# Patient Record
Sex: Male | Born: 1970 | Race: White | Hispanic: No | Marital: Single | Smoking: Current every day smoker
Health system: Southern US, Community
[De-identification: ages and names within clinical notes are randomized; demographics above are authoritative.]

## PROBLEM LIST (undated history)

## (undated) DIAGNOSIS — F1020 Alcohol dependence, uncomplicated: Secondary | ICD-10-CM

## (undated) DIAGNOSIS — F32A Depression, unspecified: Secondary | ICD-10-CM

## (undated) DIAGNOSIS — F329 Major depressive disorder, single episode, unspecified: Secondary | ICD-10-CM

## (undated) DIAGNOSIS — D696 Thrombocytopenia, unspecified: Secondary | ICD-10-CM

## (undated) DIAGNOSIS — F319 Bipolar disorder, unspecified: Secondary | ICD-10-CM

## (undated) HISTORY — PX: NO PAST SURGERIES: SHX2092

---

## 2010-03-17 ENCOUNTER — Emergency Department: Payer: Self-pay | Admitting: Emergency Medicine

## 2012-03-18 ENCOUNTER — Encounter (HOSPITAL_COMMUNITY): Payer: Self-pay

## 2012-03-18 ENCOUNTER — Emergency Department (HOSPITAL_COMMUNITY)
Admission: EM | Admit: 2012-03-18 | Discharge: 2012-03-20 | Disposition: A | Discharge status: Home / Self Care | Payer: Self-pay | Attending: Emergency Medicine | Admitting: Emergency Medicine

## 2012-03-18 DIAGNOSIS — R259 Unspecified abnormal involuntary movements: Secondary | ICD-10-CM | POA: Insufficient documentation

## 2012-03-18 DIAGNOSIS — F1010 Alcohol abuse, uncomplicated: Secondary | ICD-10-CM | POA: Insufficient documentation

## 2012-03-18 LAB — RAPID URINE DRUG SCREEN, HOSP PERFORMED
Amphetamines: NOT DETECTED
Barbiturates: NOT DETECTED
Benzodiazepines: POSITIVE — AB
Cocaine: NOT DETECTED

## 2012-03-18 LAB — COMPREHENSIVE METABOLIC PANEL
ALT: 67 U/L — ABNORMAL HIGH (ref 0–53)
Albumin: 4.2 g/dL (ref 3.5–5.2)
Alkaline Phosphatase: 92 U/L (ref 39–117)
Calcium: 9.2 mg/dL (ref 8.4–10.5)
GFR calc Af Amer: >90 mL/min (ref >90)
Glucose, Bld: 147 mg/dL — ABNORMAL HIGH (ref 70–99)
Potassium: 3.8 mEq/L (ref 3.5–5.1)
Sodium: 139 mEq/L (ref 135–145)
Total Protein: 7.7 g/dL (ref 6.0–8.3)

## 2012-03-18 LAB — ACETAMINOPHEN LEVEL: Acetaminophen (Tylenol), Serum: <15 ug/mL (ref 10–30)

## 2012-03-18 LAB — CBC
MCH: 32.9 pg (ref 26.0–34.0)
MCHC: 36.2 g/dL — ABNORMAL HIGH (ref 30.0–36.0)
RDW: 14.7 % (ref 11.5–15.5)

## 2012-03-18 LAB — ETHANOL: Alcohol, Ethyl (B): 340 mg/dL — ABNORMAL HIGH (ref 0–11)

## 2012-03-18 MED ORDER — ZOLPIDEM TARTRATE 5 MG PO TABS
5.0000 mg | ORAL_TABLET | Freq: Every evening | ORAL | Status: DC | PRN
  Administered 2012-03-19 (×2): 5 mg via ORAL
  Filled 2012-03-18 (×2): qty 1

## 2012-03-18 MED ORDER — VITAMIN B-1 100 MG PO TABS
100.0000 mg | ORAL_TABLET | Freq: Every day | ORAL | Status: DC
  Administered 2012-03-18 – 2012-03-20 (×3): 100 mg via ORAL
  Filled 2012-03-18 (×3): qty 1

## 2012-03-18 MED ORDER — SODIUM CHLORIDE 0.9 % IV BOLUS (SEPSIS)
1000.0000 mL | Freq: Once | INTRAVENOUS | Status: AC
  Administered 2012-03-18: 1000 mL via INTRAVENOUS

## 2012-03-18 MED ORDER — LORAZEPAM 1 MG PO TABS
1.0000 mg | ORAL_TABLET | Freq: Four times a day (QID) | ORAL | Status: DC | PRN
  Administered 2012-03-19 – 2012-03-20 (×2): 1 mg via ORAL
  Filled 2012-03-18 (×3): qty 1
  Filled 2012-03-18: qty 2

## 2012-03-18 MED ORDER — THIAMINE HCL 100 MG/ML IJ SOLN: 100.0000 mg | Freq: Every day | INTRAMUSCULAR | Status: DC

## 2012-03-18 MED ORDER — LORAZEPAM 1 MG PO TABS
0.0000 mg | ORAL_TABLET | Freq: Two times a day (BID) | ORAL | Status: DC
  Filled 2012-03-18: qty 1

## 2012-03-18 MED ORDER — THIAMINE HCL 100 MG/ML IJ SOLN
Freq: Once | INTRAVENOUS | Status: DC
  Filled 2012-03-18: qty 1000

## 2012-03-18 MED ORDER — ONDANSETRON HCL 4 MG PO TABS
4.0000 mg | ORAL_TABLET | Freq: Three times a day (TID) | ORAL | Status: DC | PRN
  Administered 2012-03-19: 4 mg via ORAL
  Filled 2012-03-18: qty 1

## 2012-03-18 MED ORDER — LORAZEPAM 2 MG/ML IJ SOLN: 1.0000 mg | Freq: Once | INTRAMUSCULAR | Status: DC

## 2012-03-18 MED ORDER — ACETAMINOPHEN 325 MG PO TABS
650.0000 mg | ORAL_TABLET | ORAL | Status: DC | PRN
  Administered 2012-03-19: 650 mg via ORAL
  Filled 2012-03-18: qty 2

## 2012-03-18 MED ORDER — LORAZEPAM 1 MG PO TABS
0.0000 mg | ORAL_TABLET | Freq: Four times a day (QID) | ORAL | Status: AC
  Administered 2012-03-18 – 2012-03-19 (×4): 1 mg via ORAL
  Administered 2012-03-19 – 2012-03-20 (×2): 2 mg via ORAL
  Administered 2012-03-20: 1 mg via ORAL
  Filled 2012-03-18 (×2): qty 1
  Filled 2012-03-18: qty 2

## 2012-03-18 MED ORDER — FOLIC ACID 1 MG PO TABS
1.0000 mg | ORAL_TABLET | Freq: Every day | ORAL | Status: DC
  Administered 2012-03-18 – 2012-03-20 (×3): 1 mg via ORAL
  Filled 2012-03-18 (×3): qty 1

## 2012-03-18 MED ORDER — ADULT MULTIVITAMIN W/MINERALS CH
1.0000 | ORAL_TABLET | Freq: Every day | ORAL | Status: DC
  Administered 2012-03-18 – 2012-03-20 (×3): 1 via ORAL
  Filled 2012-03-18 (×3): qty 1

## 2012-03-18 MED ORDER — LORAZEPAM 2 MG/ML IJ SOLN
1.0000 mg | Freq: Four times a day (QID) | INTRAMUSCULAR | Status: DC | PRN
  Administered 2012-03-18 (×2): 1 mg via INTRAVENOUS
  Filled 2012-03-18 (×2): qty 1

## 2012-03-18 MED ORDER — NICOTINE 21 MG/24HR TD PT24
21.0000 mg | MEDICATED_PATCH | Freq: Every day | TRANSDERMAL | Status: DC
  Administered 2012-03-18 – 2012-03-20 (×3): 21 mg via TRANSDERMAL
  Filled 2012-03-18 (×3): qty 1

## 2012-03-18 NOTE — ED Notes (Signed)
Pt here for ETOH Detox, voluntary, previous hx of detox in past, Last drank this morning (can of beer), Pt usually drinks heavy daily.
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
## 2012-03-18 NOTE — ED Notes (Signed)
Pt states he will probably have a seizure in about an hour. States about 12-15 hrs after having his last drink, he has seizures.

## 2012-03-18 NOTE — ED Provider Notes (Signed)
Medical screening examination/treatment/procedure(s) were performed by non-physician practitioner and as supervising physician I was immediately available for consultation/collaboration.  John-Adam Heavenleigh Petruzzi, M.D.     John-Adam Bohden Dung, MD 03/18/12 1821 

## 2012-03-18 NOTE — ED Notes (Signed)
Gave patient a coke, sandwich, and some potato chips

## 2012-03-18 NOTE — ED Notes (Signed)
Pt states he is here for alcohol detox. States he drinks about a 5th of liquor every day chased by a couple of beers. Pt states that he has been through detox before but it was over a year ago.

## 2012-03-18 NOTE — ED Provider Notes (Signed)
History     CSN: [id number]  Arrival date & time 03/18/12  1402   First MD Initiated Contact with Patient 03/18/12 1535      Chief Complaint  Patient presents with  . Medical Clearance    (Consider location/radiation/quality/duration/timing/severity/associated sxs/prior treatment) Patient is a 41 y.o. male presenting with mental health disorder. The history is provided by the patient.  Mental Health Problem The primary symptoms do not include hallucinations. Primary symptoms comment: He is here for help with alcohol detox. He has had vomiting but is not actively nauseaous.  Additional symptoms of the illness do not include no abdominal pain. Associated symptoms comments: He denies substance abuse other than alcohol. Last drinking was this morning. He reports history of seizures with alcohol cessation in the past. .    Past Medical History  Diagnosis Date  . Alcohol abuse     Past Surgical History  Procedure Date  . No past surgeries     No family history on file.  History  Substance Use Topics  . Smoking status: Current Every Day Smoker  . Smokeless tobacco: Not on file  . Alcohol Use: Yes      Review of Systems  Constitutional: Negative for fever.  Respiratory: Negative for shortness of breath.   Cardiovascular: Negative for chest pain.  Gastrointestinal: Negative for nausea and abdominal pain.  Psychiatric/Behavioral: Negative for suicidal ideas and hallucinations.    Allergies  Review of patient's allergies indicates no known allergies.  Home Medications  No current outpatient prescriptions on file.  BP 139/95  Pulse 95  Temp 98.1 F (36.7 C) (Oral)  Resp 14  SpO2 94%  Physical Exam  Constitutional: He is oriented to person, place, and time. He appears well-developed and well-nourished.  HENT:  Head: Normocephalic.  Neck: Normal range of motion. Neck supple.  Cardiovascular: Normal rate and regular rhythm.   Pulmonary/Chest: Effort normal and  breath sounds normal.  Abdominal: Soft. Bowel sounds are normal. There is no tenderness. There is no rebound and no guarding.  Musculoskeletal: Normal range of motion.  Neurological: He is alert and oriented to person, place, and time.  Skin: Skin is warm and dry. No rash noted.  Psychiatric: He has a normal mood and affect. Judgment and thought content normal.    ED Course  Procedures (including critical care time)  Labs Reviewed  URINE RAPID DRUG SCREEN (HOSP PERFORMED) - Abnormal; Notable for the following:    Benzodiazepines POSITIVE (*)     All other components within normal limits  CBC  COMPREHENSIVE METABOLIC PANEL  ETHANOL  ACETAMINOPHEN LEVEL  SALICYLATE LEVEL   No results found.   No diagnosis found. 1. Alcohol dependence   MDM  Orders entered for CIWA, psych hold. Patient care transferred to Margarita Grizzle, MD.        Rodena Medin, PA-C 03/18/12 1606

## 2012-03-19 DIAGNOSIS — F1020 Alcohol dependence, uncomplicated: Secondary | ICD-10-CM

## 2012-03-19 LAB — CBC WITH DIFFERENTIAL/PLATELET
Basophils Relative: 1 % (ref 0–1)
Eosinophils Absolute: 0.1 10*3/uL (ref 0.0–0.7)
Eosinophils Relative: 1 % (ref 0–5)
Hemoglobin: 14 g/dL (ref 13.0–17.0)
Lymphs Abs: 0.8 10*3/uL (ref 0.7–4.0)
MCH: 32.3 pg (ref 26.0–34.0)
MCHC: 35.3 g/dL (ref 30.0–36.0)
MCV: 91.7 fL (ref 78.0–100.0)
Monocytes Absolute: 0.6 10*3/uL (ref 0.1–1.0)
Monocytes Relative: 12 % (ref 3–12)
Neutrophils Relative %: 71 % (ref 43–77)
RBC: 4.33 MIL/uL (ref 4.22–5.81)

## 2012-03-19 NOTE — ED Notes (Signed)
Notied EDP of CBC results, EDP inquired about pt.'s condition, informed EDP that pt. Was not bleeding.

## 2012-03-19 NOTE — ED Notes (Signed)
Attempted to get pt.'s V/S, pt. Refused.  Pt. Becoming agitated, will return by 1730 to ask to get V/S.

## 2012-03-19 NOTE — ED Notes (Signed)
158/93 recheck of B/P.

## 2012-03-19 NOTE — ED Notes (Signed)
Nicotine patch removed at 1000.
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
## 2012-03-19 NOTE — ED Notes (Signed)
Notified EDP of pt.'s B/P, will continue to monitor pt.'s condition and redo pt.'s V/S 1700.

## 2012-03-19 NOTE — Consult Note (Signed)
Reason for Consult: Alcohol intoxication, withdrawal and dependence. Referring Physician: Dr. Elisabeth Pigeon Gortney is an 41 y.o. male.  HPI: Patient was seen and chart reviewed. Patient has been suffering with the alcohol abuse versus dependence over several years. Patient has been drinking alcohol, including vodka over or anything with alcohol he can get. Patient has a history of for alcohol treatments both at [facility] and the [facility] in [place]. Patient presented with the blood alcohol level 340 and the urine drug screen positive for benzodiazepines. Patient has withdrawal symptoms like shakes, cold sweats, nausea, and disturbance of sleep. Patient has a 17 years old son, who also drinks occasionally, and 46 years old twins who stays with their mom. Patient used to work for maintenance and set of for PODs. Patient lives in [place] and works in [place], Patient has passive history of for DWIs x 11, delirium tremens and seizures in 1998. Patient has no history of for depression anxiety mania, psychosis.  Past Medical History  Diagnosis Date  . Alcohol abuse     Past Surgical History  Procedure Date  . No past surgeries     No family history on file.  Social History:  reports that he has been smoking.  He does not have any smokeless tobacco history on file. He reports that he drinks alcohol. He reports that he does not use illicit drugs.  Allergies: No Known Allergies  Medications: I have reviewed the patient's current medications.  Results for orders placed during the hospital encounter of 03/18/12 (from the past 48 hour(s))  URINE RAPID DRUG SCREEN (HOSP PERFORMED)     Status: Abnormal   Collection Time   03/18/12  3:07 PM      Component Value Range Comment   Opiates NONE DETECTED  NONE DETECTED    Cocaine NONE DETECTED  NONE DETECTED    Benzodiazepines POSITIVE (*) NONE DETECTED    Amphetamines NONE DETECTED  NONE DETECTED    Tetrahydrocannabinol NONE DETECTED  NONE DETECTED      Barbiturates NONE DETECTED  NONE DETECTED   CBC     Status: Abnormal   Collection Time   03/18/12  3:10 PM      Component Value Range Comment   WBC 4.5  4.0 - 10.5 K/uL    RBC 4.77  4.22 - 5.81 MIL/uL    Hemoglobin 15.7  13.0 - 17.0 g/dL    HCT 78.2  95.6 - 21.3 %    MCV 91.0  78.0 - 100.0 fL    MCH 32.9  26.0 - 34.0 pg    MCHC 36.2 (*) 30.0 - 36.0 g/dL    RDW 08.6  57.8 - 46.9 %    Platelets 36 (*) 150 - 400 K/uL   COMPREHENSIVE METABOLIC PANEL     Status: Abnormal   Collection Time   03/18/12  3:10 PM      Component Value Range Comment   Sodium 139  135 - 145 mEq/L    Potassium 3.8  3.5 - 5.1 mEq/L    Chloride 96  96 - 112 mEq/L    CO2 29  19 - 32 mEq/L    Glucose, Bld 147 (*) 70 - 99 mg/dL    BUN 10  6 - 23 mg/dL    Creatinine, Ser 6.29  0.50 - 1.35 mg/dL    Calcium 9.2  8.4 - 52.8 mg/dL    Total Protein 7.7  6.0 - 8.3 g/dL    Albumin 4.2  3.5 -  5.2 g/dL    AST 914 (*) 0 - 37 U/L    ALT 67 (*) 0 - 53 U/L    Alkaline Phosphatase 92  39 - 117 U/L    Total Bilirubin 0.5  0.3 - 1.2 mg/dL    GFR calc non Af Amer >90  >90 mL/min    GFR calc Af Amer >90  >90 mL/min   ETHANOL     Status: Abnormal   Collection Time   03/18/12  3:10 PM      Component Value Range Comment   Alcohol, Ethyl (B) 340 (*) 0 - 11 mg/dL   ACETAMINOPHEN LEVEL     Status: Normal   Collection Time   03/18/12  3:10 PM      Component Value Range Comment   Acetaminophen (Tylenol), Serum <15.0  10 - 30 ug/mL   SALICYLATE LEVEL     Status: Abnormal   Collection Time   03/18/12  3:10 PM      Component Value Range Comment   Salicylate Lvl <2.0 (*) 2.8 - 20.0 mg/dL   CBC WITH DIFFERENTIAL     Status: Abnormal   Collection Time   03/19/12  2:24 PM      Component Value Range Comment   WBC 5.1  4.0 - 10.5 K/uL    RBC 4.33  4.22 - 5.81 MIL/uL    Hemoglobin 14.0  13.0 - 17.0 g/dL    HCT 78.2  95.6 - 21.3 %    MCV 91.7  78.0 - 100.0 fL    MCH 32.3  26.0 - 34.0 pg    MCHC 35.3  30.0 - 36.0 g/dL    RDW 08.6   57.8 - 46.9 %    Platelets 31 (*) 150 - 400 K/uL    Neutrophils Relative 71  43 - 77 %    Neutro Abs 3.7  1.7 - 7.7 K/uL    Lymphocytes Relative 15  12 - 46 %    Lymphs Abs 0.8  0.7 - 4.0 K/uL    Monocytes Relative 12  3 - 12 %    Monocytes Absolute 0.6  0.1 - 1.0 K/uL    Eosinophils Relative 1  0 - 5 %    Eosinophils Absolute 0.1  0.0 - 0.7 K/uL    Basophils Relative 1  0 - 1 %    Basophils Absolute 0.0  0.0 - 0.1 K/uL     No results found.  Positive for anxiety, excessive alcohol consumption and sleep disturbance Blood pressure 148/93, pulse 81, temperature 98.3 F (36.8 C), temperature source Oral, resp. rate 18, SpO2 97.00%.   Assessment/Plan: Alcohol intoxication withdrawal, and dependence. Benzodiazepine abuse.  Admit to the acute psychiatric hospitalization for detox treatment and stabilization. Continue his current medication treatment for alcohol withdrawal symptoms.  Bell Carbo,JANARDHAHA R. 03/19/2012, 4:46 PM

## 2012-03-19 NOTE — ED Notes (Signed)
Pt complaining of not being able to get any sleep due to people coming in his room. Explained to pt that his blood pressure was elevated and needed to be rechecked.
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
## 2012-03-20 NOTE — ED Provider Notes (Signed)
Patient is in the emergency room voluntarily for alcohol detox. He has been here for over 2 days. Has been unable to be placed. There is no evidence of psychosis. He is not depressed. No significant cognitive impairment. Has mild tremors but no florid withdrawal symptoms. Feel he is appropriate for discharge at this time. Patient will be provided outpatient resources to followup.  Raeford Razor, MD 03/20/12 2303

## 2012-03-20 NOTE — ED Notes (Signed)
Report received-airway intact-no s/s's of distress-will continue to monitor 

## 2012-03-20 NOTE — [facility] Counselor (Signed)
TC with Okey Regal @ [facility] at 1220pm. Stated they had not gotten a referral on this pt. Stated the only have 2 male detox beds available at this time.

## 2012-03-20 NOTE — ED Notes (Signed)
Patient states he wants a Nurse, learning disability when he speaks to Dr Elsie Saas

## 2012-03-20 NOTE — Consult Note (Signed)
Reason for Consult: Alcohol withdrawal symptoms Referring Physician: Dr. Blinda Leatherwood Opdahl is an 41 y.o. male.  HPI: Patient was staying in his bed with a mild hand tremors but overall feeling better with his treatment. Reportedly he has sleep difficulties and sweating over the night. He is eating better than yesterday. Patient was not interested in long-term rehabilitation services and he he had a good insurance and stated rehabilitation services won't take it. He denied symptoms of depression, anxiety, suicidal, homicidal ideations, and psychosis.  Past Medical History  Diagnosis Date  . Alcohol abuse     Past Surgical History  Procedure Date  . No past surgeries     No family history on file.  Social History:  reports that he has been smoking.  He does not have any smokeless tobacco history on file. He reports that he drinks alcohol. He reports that he does not use illicit drugs.  Allergies: No Known Allergies  Medications: I have reviewed the patient's current medications.  Results for orders placed during the hospital encounter of 03/18/12 (from the past 48 hour(s))  CBC WITH DIFFERENTIAL     Status: Abnormal   Collection Time   03/19/12  2:24 PM      Component Value Range Comment   WBC 5.1  4.0 - 10.5 K/uL    RBC 4.33  4.22 - 5.81 MIL/uL    Hemoglobin 14.0  13.0 - 17.0 g/dL    HCT 28.4  13.2 - 44.0 %    MCV 91.7  78.0 - 100.0 fL    MCH 32.3  26.0 - 34.0 pg    MCHC 35.3  30.0 - 36.0 g/dL    RDW 10.2  72.5 - 36.6 %    Platelets 31 (*) 150 - 400 K/uL    Neutrophils Relative 71  43 - 77 %    Neutro Abs 3.7  1.7 - 7.7 K/uL    Lymphocytes Relative 15  12 - 46 %    Lymphs Abs 0.8  0.7 - 4.0 K/uL    Monocytes Relative 12  3 - 12 %    Monocytes Absolute 0.6  0.1 - 1.0 K/uL    Eosinophils Relative 1  0 - 5 %    Eosinophils Absolute 0.1  0.0 - 0.7 K/uL    Basophils Relative 1  0 - 1 %    Basophils Absolute 0.0  0.0 - 0.1 K/uL     No results found.  No depression, No  psychosis and Positive for anxiety, excessive alcohol consumption, sleep disturbance and Hand tremors. Blood pressure 136/87, pulse 93, temperature 98.1 F (36.7 C), temperature source Oral, resp. rate 18, SpO2 97.00%.   Assessment/Plan: Alcohol intoxication and withdrawal Alcohol dependence  Pending [facility] placement. Continue CIWA PROTOCOL.  Carys Malina,JANARDHAHA R. 03/20/2012, 3:42 PM

## 2012-03-20 NOTE — ED Provider Notes (Signed)
Patient is here for alcohol abuse. He states he has not been sleeping well the last several days. He still feels shaky. Filed Vitals:   03/20/12 0508  BP: 146/91  Pulse: 67  Temp: 97.8 F (36.6 C)  Resp: 18     On exam he appears comfortable in no distress. No diaphoresis. Mild tremors noted.  Patient was given Ativan this morning. He is on the CIWA protocol.  The patient was seen by the psychiatrist yesterday. Recommends continuing the current plan. He appears medically stable and is awaiting psychiatric disposition  Celene Kras, MD 03/20/12 [phone]

## 2012-03-20 NOTE — ED Notes (Signed)
Given sandwich and chips 

## 2012-03-20 NOTE — ED Notes (Signed)
Informed MD of elevated BP-ok to medicate per CIWA protocal

## 2012-03-20 NOTE — ED Notes (Signed)
Pt sleeping CIWA score 0

## 2012-03-20 NOTE — ED Notes (Signed)
Pt angry and cursing, because he is being d/c, refusing to allow d/c education, paperwork given, pt continues to curse and fuss about having to leave, refusing to allow updated VS. Escorted out with large hamper full of belongings.

## 2014-08-25 ENCOUNTER — Encounter (HOSPITAL_COMMUNITY): Payer: Self-pay | Admitting: Emergency Medicine

## 2014-08-25 ENCOUNTER — Emergency Department (HOSPITAL_COMMUNITY)
Admission: EM | Admit: 2014-08-25 | Discharge: 2014-08-27 | Disposition: A | Discharge status: Home / Self Care | Payer: Self-pay | Attending: Emergency Medicine | Admitting: Emergency Medicine

## 2014-08-25 ENCOUNTER — Emergency Department (HOSPITAL_COMMUNITY)
Admission: EM | Admit: 2014-08-25 | Discharge: 2014-08-25 | Disposition: A | Discharge status: Home / Self Care | Payer: Self-pay | Attending: Emergency Medicine | Admitting: Emergency Medicine

## 2014-08-25 DIAGNOSIS — F10230 Alcohol dependence with withdrawal, uncomplicated: Secondary | ICD-10-CM | POA: Diagnosis present

## 2014-08-25 DIAGNOSIS — Z720 Tobacco use: Secondary | ICD-10-CM | POA: Insufficient documentation

## 2014-08-25 DIAGNOSIS — F10920 Alcohol use, unspecified with intoxication, uncomplicated: Secondary | ICD-10-CM

## 2014-08-25 DIAGNOSIS — R110 Nausea: Secondary | ICD-10-CM | POA: Insufficient documentation

## 2014-08-25 DIAGNOSIS — F1010 Alcohol abuse, uncomplicated: Secondary | ICD-10-CM

## 2014-08-25 DIAGNOSIS — Z79899 Other long term (current) drug therapy: Secondary | ICD-10-CM | POA: Insufficient documentation

## 2014-08-25 DIAGNOSIS — F10120 Alcohol abuse with intoxication, uncomplicated: Secondary | ICD-10-CM | POA: Insufficient documentation

## 2014-08-25 DIAGNOSIS — F10129 Alcohol abuse with intoxication, unspecified: Secondary | ICD-10-CM | POA: Insufficient documentation

## 2014-08-25 LAB — COMPREHENSIVE METABOLIC PANEL
ALBUMIN: 4.5 g/dL (ref 3.5–5.2)
ALT: 15 U/L (ref 0–53)
AST: 30 U/L (ref 0–37)
Alkaline Phosphatase: 70 U/L (ref 39–117)
Anion gap: 10 (ref 5–15)
BILIRUBIN TOTAL: 0.4 mg/dL (ref 0.3–1.2)
BUN: 11 mg/dL (ref 6–23)
CHLORIDE: 105 mmol/L (ref 96–112)
CO2: 26 mmol/L (ref 19–32)
CREATININE: 0.67 mg/dL (ref 0.50–1.35)
Calcium: 8.5 mg/dL (ref 8.4–10.5)
GFR calc Af Amer: >90 mL/min (ref >90)
GFR calc non Af Amer: >90 mL/min (ref >90)
Glucose, Bld: 101 mg/dL — ABNORMAL HIGH (ref 70–99)
Potassium: 3.8 mmol/L (ref 3.5–5.1)
SODIUM: 141 mmol/L (ref 135–145)
Total Protein: 7.4 g/dL (ref 6.0–8.3)

## 2014-08-25 LAB — CBC
HCT: 43 % (ref 39.0–52.0)
Hemoglobin: 14.7 g/dL (ref 13.0–17.0)
MCH: 32.2 pg (ref 26.0–34.0)
MCHC: 34.2 g/dL (ref 30.0–36.0)
MCV: 94.1 fL (ref 78.0–100.0)
PLATELETS: 281 10*3/uL (ref 150–400)
RBC: 4.57 MIL/uL (ref 4.22–5.81)
RDW: 13.7 % (ref 11.5–15.5)
WBC: 6.5 10*3/uL (ref 4.0–10.5)

## 2014-08-25 LAB — RAPID URINE DRUG SCREEN, HOSP PERFORMED
Amphetamines: NOT DETECTED
Barbiturates: NOT DETECTED
Benzodiazepines: NOT DETECTED
Cocaine: NOT DETECTED
OPIATES: NOT DETECTED
Tetrahydrocannabinol: NOT DETECTED

## 2014-08-25 LAB — ACETAMINOPHEN LEVEL: Acetaminophen (Tylenol), Serum: <10 ug/mL — ABNORMAL LOW (ref 10–30)

## 2014-08-25 LAB — ETHANOL: Alcohol, Ethyl (B): 318 mg/dL — ABNORMAL HIGH (ref 0–9)

## 2014-08-25 LAB — SALICYLATE LEVEL

## 2014-08-25 MED ORDER — ONDANSETRON 4 MG PO TBDP: 4.0000 mg | ORAL_TABLET | Freq: Three times a day (TID) | ORAL | Status: DC | PRN

## 2014-08-25 MED ORDER — ONDANSETRON 4 MG PO TBDP
4.0000 mg | ORAL_TABLET | Freq: Once | ORAL | Status: AC
  Administered 2014-08-25: 4 mg via ORAL
  Filled 2014-08-25: qty 1

## 2014-08-25 NOTE — ED Notes (Signed)
Pt eating sandwich in room.

## 2014-08-25 NOTE — ED Notes (Signed)
Pt called from triage, no answer 

## 2014-08-25 NOTE — ED Notes (Signed)
Called pt twice for lab draw, no answer. 

## 2014-08-25 NOTE — ED Notes (Signed)
Pt finished with sandwich and used the restroom. Told pt that he needed to follow me to the discharge window. Pt states he was supposed to get a prescription for nausea "to help detox me".   I spoke with [place] NP, she confirmed she wasn't giving pt prescription.   Made pt aware that we gave him nausea meds here but she wasn't giving him a prescription.   Pt requesting to speak with provider.

## 2014-08-25 NOTE — Discharge Instructions (Signed)
Alcohol Intoxication  Alcohol intoxication occurs when the amount of alcohol that a person has consumed impairs his or her ability to mentally and physically function. Alcohol directly impairs the normal chemical activity of the brain. Drinking large amounts of alcohol can lead to changes in mental function and behavior, and it can cause many physical effects that can be harmful.   Alcohol intoxication can range in severity from mild to very severe. Various factors can affect the level of intoxication that occurs, such as the person's age, gender, weight, frequency of alcohol consumption, and the presence of other medical conditions (such as diabetes, seizures, or heart conditions). Dangerous levels of alcohol intoxication may occur when people drink large amounts of alcohol in a short period (binge drinking). Alcohol can also be especially dangerous when combined with certain prescription medicines or "recreational" drugs.  SIGNS AND SYMPTOMS  Some common signs and symptoms of mild alcohol intoxication include:  · Loss of coordination.  · Changes in mood and behavior.  · Impaired judgment.  · Slurred speech.  As alcohol intoxication progresses to more severe levels, other signs and symptoms will appear. These may include:  · Vomiting.  · Confusion and impaired memory.  · Slowed breathing.  · Seizures.  · Loss of consciousness.  DIAGNOSIS   Your health care provider will take a medical history and perform a physical exam. You will be asked about the amount and type of alcohol you have consumed. Blood tests will be done to measure the concentration of alcohol in your blood. In many places, your blood alcohol level must be lower than 80 mg/dL (0.08%) to legally drive. However, many dangerous effects of alcohol can occur at much lower levels.   TREATMENT   People with alcohol intoxication often do not require treatment. Most of the effects of alcohol intoxication are temporary, and they go away as the alcohol naturally  leaves the body. Your health care provider will monitor your condition until you are stable enough to go home. Fluids are sometimes given through an IV access tube to help prevent dehydration.   HOME CARE INSTRUCTIONS  · Do not drive after drinking alcohol.  · Stay hydrated. Drink enough water and fluids to keep your urine clear or pale yellow. Avoid caffeine.    · Only take over-the-counter or prescription medicines as directed by your health care provider.    SEEK MEDICAL CARE IF:   · You have persistent vomiting.    · You do not feel better after a few days.  · You have frequent alcohol intoxication. Your health care provider can help determine if you should see a substance use treatment counselor.  SEEK IMMEDIATE MEDICAL CARE IF:   · You become shaky or tremble when you try to stop drinking.    · You shake uncontrollably (seizure).    · You throw up (vomit) blood. This may be bright red or may look like black coffee grounds.    · You have blood in your stool. This may be bright red or may appear as a black, tarry, bad smelling stool.    · You become lightheaded or faint.    MAKE SURE YOU:   · Understand these instructions.  · Will watch your condition.  · Will get help right away if you are not doing well or get worse.  Document Released: 03/22/2005 Document Revised: 02/12/2013 Document Reviewed: 11/15/2012  ExitCare® Patient Information ©2015 [employer]. This information is not intended to replace advice given to you by your health care provider. Make sure   you discuss any questions you have with your health care provider.

## 2014-08-25 NOTE — ED Notes (Signed)
Pt called again from triage, no answer 

## 2014-08-25 NOTE — ED Provider Notes (Signed)
CSN: [id number]     Arrival date & time 08/25/14  1258 History  This chart was scribed for non-physician practitioner, Teressa LowerVrinda Kwali Wrinkle, NP working with Layla MawKristen N Ward, DO by Greggory StallionKayla Andersen, ED scribe. This patient was seen in room [facility] and the patient's care was started at 2:15 PM.  Chief Complaint  Patient presents with  . Alcohol Intoxication    pt requesting detox   The history is provided by the patient. No language interpreter was used.    HPI Comments: Jordan Davidson is a 44 y.o. male with history of alcohol abuse who presents to the Emergency Department requesting alcohol detox. Pt was found laying in the grass at a bus stop by [employer] and requested to come here instead of go to jail. He has had about half a gallon of alcohol today and states that is normal for him. His last drink was 6-7 hours ago. Pt reports nausea but no other symptoms currently. He has had DTs and seizures with detoxing in the past.   Past Medical History  Diagnosis Date  . Alcohol abuse    Past Surgical History  Procedure Laterality Date  . No past surgeries     No family history on file. History  Substance Use Topics  . Smoking status: Current Every Day Smoker  . Smokeless tobacco: Not on file  . Alcohol Use: Yes    Review of Systems  Gastrointestinal: Positive for nausea.  All other systems reviewed and are negative.  Allergies  Review of patient's allergies indicates no known allergies.  Home Medications   Prior to Admission medications   Not on File   BP 119/74 mmHg  Pulse 108  Temp(Src) 98.7 F (37.1 C) (Oral)  Resp 16  SpO2 94%   Physical Exam  Constitutional: He is oriented to person, place, and time. He appears well-developed and well-nourished. No distress.  HENT:  Head: Normocephalic and atraumatic.  Eyes: Conjunctivae and EOM are normal.  Neck: Neck supple. No tracheal deviation present.  Cardiovascular: Normal rate.   Pulmonary/Chest: Effort normal. No respiratory distress.   Musculoskeletal: Normal range of motion.  Neurological: He is alert and oriented to person, place, and time.  Skin: Skin is warm and dry.  Psychiatric: He has a normal mood and affect. His behavior is normal.  Nursing note and vitals reviewed.   ED Course  Procedures (including critical care time)  DIAGNOSTIC STUDIES: Oxygen Saturation is 99% on RA, normal by my interpretation.    COORDINATION OF CARE: 2:18 PM-Discussed treatment plan which includes outpatient resources with pt at bedside and pt agreed to plan.   Labs Review Labs Reviewed - No data to display  Imaging Review No results found.   EKG Interpretation None      MDM   Final diagnoses:  Alcohol intoxication, uncomplicated    Pt not requesting detox. Pt states that he wanted to avoid going to jail. Pt ambulatory here without any problem  I personally performed the services described in this documentation, which was scribed in my presence. The recorded information has been reviewed and is accurate.   Teressa LowerVrinda Shekinah Pitones, NP 08/25/14 1440  Layla MawKristen N Ward, DO 08/25/14 [phone]

## 2014-08-25 NOTE — ED Notes (Signed)
Pt states "he's tired of drinking" and "wants to stop." States his last drink was a few hours ago, and he wants ETOH detox. Says he's unsure of how much he's had to drink tonight, and was observed to have a stumbling gait. Vitals stable, no signs of detox observed at this time

## 2014-08-25 NOTE — ED Notes (Signed)
Per EMS-#80. Pt was found lying in grass at bus stop. "I am fine, just drunk" PERL. Ambulatory. Knife taken from pt by [employer], without resistance.

## 2014-08-26 DIAGNOSIS — F10230 Alcohol dependence with withdrawal, uncomplicated: Secondary | ICD-10-CM | POA: Diagnosis present

## 2014-08-26 MED ORDER — VITAMIN B-1 100 MG PO TABS
100.0000 mg | ORAL_TABLET | Freq: Every day | ORAL | Status: DC
  Administered 2014-08-26: 100 mg via ORAL
  Filled 2014-08-26: qty 1

## 2014-08-26 MED ORDER — LORAZEPAM 2 MG/ML IJ SOLN
0.0000 mg | Freq: Four times a day (QID) | INTRAMUSCULAR | Status: DC
  Administered 2014-08-27: 1 mg via INTRAVENOUS

## 2014-08-26 MED ORDER — LORAZEPAM 2 MG/ML IJ SOLN: 0.0000 mg | Freq: Two times a day (BID) | INTRAMUSCULAR | Status: DC

## 2014-08-26 MED ORDER — LORAZEPAM 1 MG PO TABS
1.0000 mg | ORAL_TABLET | Freq: Three times a day (TID) | ORAL | Status: DC | PRN
  Administered 2014-08-26 – 2014-08-27 (×3): 1 mg via ORAL
  Filled 2014-08-26 (×3): qty 1

## 2014-08-26 MED ORDER — ONDANSETRON HCL 4 MG PO TABS: 4.0000 mg | ORAL_TABLET | Freq: Three times a day (TID) | ORAL | Status: DC | PRN

## 2014-08-26 MED ORDER — THIAMINE HCL 100 MG/ML IJ SOLN: 100.0000 mg | Freq: Every day | INTRAMUSCULAR | Status: DC

## 2014-08-26 MED ORDER — ACETAMINOPHEN 325 MG PO TABS: 650.0000 mg | ORAL_TABLET | ORAL | Status: DC | PRN

## 2014-08-26 MED ORDER — NICOTINE 21 MG/24HR TD PT24: 21.0000 mg | MEDICATED_PATCH | Freq: Every day | TRANSDERMAL | Status: DC

## 2014-08-26 MED ORDER — IBUPROFEN 200 MG PO TABS: 600.0000 mg | ORAL_TABLET | Freq: Three times a day (TID) | ORAL | Status: DC | PRN
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
## 2014-08-26 NOTE — ED Notes (Signed)
Pt wanded by security. 

## 2014-08-26 NOTE — ED Notes (Signed)
Provider explained to patient that POC has been changed and he will now be staying overnight.  Support offered.

## 2014-08-26 NOTE — BH Assessment (Signed)
Spoke with EDP about pt. Earlier pt declined detox and is now asking for detox. Pt reports hx of DTs and seizures when detoxing on his own in the past. Pt denies SI, HI. Per Harle BattiestElizabeth Tysinger, NP pt is medically cleared for assessment.   Assessment to commence shortly.    Clista BernhardtNancy Averie Meiner, [facility] Triage Specialist 08/26/2014 3:45 AM

## 2014-08-26 NOTE — ED Notes (Signed)
Pt arrived to unit for alcohol detox. Pt denies SI/HI. No s/s of distress noted at this time. Pt malodorous and disheveled.

## 2014-08-26 NOTE — ED Provider Notes (Signed)
CSN: [id number]     Arrival date & time 08/25/14  1943 History   First MD Initiated Contact with Patient 08/26/14 0214     Chief Complaint  Patient presents with  . Alcohol Intoxication   (Consider location/radiation/quality/duration/timing/severity/associated sxs/prior Treatment) HPI  Jordan Davidson is a 44 year old male presenting with request for detox. He states he drinks approximately a half gallon of alcohol day, either beer, liquor, wine or mouthwash. He states he last drank 6 hours ago and is unsure of the quantity that states he drinks until he drunk and passes out. He reports smoking a pack of cigarettes a day and denies any recreational drugs. He denies any current complaints. He denies suicidal ideation or homicidal ideation. He states he is ready to quit drinking and needs help.  Past Medical History  Diagnosis Date  . Alcohol abuse    Past Surgical History  Procedure Laterality Date  . No past surgeries     History reviewed. No pertinent family history. History  Substance Use Topics  . Smoking status: Current Every Day Smoker  . Smokeless tobacco: Not on file  . Alcohol Use: Yes    Review of Systems  Constitutional: Negative for fever and chills.  HENT: Negative for sore throat.   Eyes: Negative for visual disturbance.  Respiratory: Negative for cough and shortness of breath.   Cardiovascular: Negative for chest pain and leg swelling.  Gastrointestinal: Positive for nausea. Negative for vomiting and diarrhea.  Genitourinary: Negative for dysuria.  Musculoskeletal: Negative for myalgias.  Skin: Negative for rash.  Neurological: Negative for weakness, numbness and headaches.  Psychiatric/Behavioral: Negative for suicidal ideas.       Alcohol abuse    Allergies  Review of patient's allergies indicates no known allergies.  Home Medications   Prior to Admission medications   Medication Sig Start Date End Date Taking? Authorizing Provider  FLUoxetine (PROZAC) 20  MG capsule Take 20 mg by mouth daily.    Historical Provider, MD  gabapentin (NEURONTIN) 300 MG capsule Take 300 mg by mouth 3 (three) times daily.    Historical Provider, MD  OLANZapine (ZYPREXA) 10 MG tablet Take 10 mg by mouth at bedtime.    Historical Provider, MD  ondansetron (ZOFRAN ODT) 4 MG disintegrating tablet Take 1 tablet (4 mg total) by mouth every 8 (eight) hours as needed for nausea or vomiting. 08/25/14   Teressa LowerVrinda Pickering, NP  traZODone (DESYREL) 100 MG tablet Take 100 mg by mouth at bedtime.    Historical Provider, MD   BP 127/79 mmHg  Pulse 104  Temp(Src) 97.9 F (36.6 C) (Oral)  Resp 18  SpO2 100% Physical Exam  Constitutional: He appears well-developed and well-nourished. No distress.  HENT:  Head: Normocephalic and atraumatic.  Mouth/Throat: Oropharynx is clear and moist. No oropharyngeal exudate.  Eyes: Conjunctivae are normal.  Neck: Neck supple. No thyromegaly present.  Cardiovascular: Normal rate, regular rhythm and intact distal pulses.   Pulmonary/Chest: Effort normal and breath sounds normal. No respiratory distress. He has no wheezes. He has no rales. He exhibits no tenderness.  Abdominal: Soft. He exhibits no distension and no mass. There is no tenderness. There is no rebound and no guarding.  Musculoskeletal: He exhibits no tenderness.  Lymphadenopathy:    He has no cervical adenopathy.  Neurological: He is alert. GCS eye subscore is 4. GCS verbal subscore is 5. GCS motor subscore is 6.  Pt smells of ETOH but is alert, can answer orientation questions and ambulate without difficulty.  Skin: Skin is warm and dry. No rash noted. He is not diaphoretic.  Psychiatric: He has a normal mood and affect.  Nursing note and vitals reviewed.   ED Course  Procedures (including critical care time) Labs Review Labs Reviewed  ACETAMINOPHEN LEVEL - Abnormal; Notable for the following:    Acetaminophen (Tylenol), Serum <10.0 (*)    All other components within normal  limits  COMPREHENSIVE METABOLIC PANEL - Abnormal; Notable for the following:    Glucose, Bld 101 (*)    All other components within normal limits  ETHANOL - Abnormal; Notable for the following:    Alcohol, Ethyl (B) 318 (*)    All other components within normal limits  CBC  SALICYLATE LEVEL  URINE RAPID DRUG SCREEN (HOSP PERFORMED)    Imaging Review No results found.   EKG Interpretation None      MDM   Final diagnoses:  Alcohol abuse   44 yo with history of alcoholism with return to the ED for the second time today intoxicated.  He reports wanting detox from alcohol. Once pt, awake and alert pt reiterates he wants detox from etoh. Patient has been medically cleared in the ED and is awaiting consult by [facility] team for possible placement. Pt is currently not having SI or HI and appears stable in NAD. Pt is cleared to be moved back to [facility].    Filed Vitals:   08/25/14 2054 08/26/14 0339  BP: 127/79 99/49  Pulse: 104 99  Temp: 97.9 F (36.6 C) 98.1 F (36.7 C)  TempSrc: Oral Oral  Resp: 18 18  SpO2: 100% 92%    Meds given in ED:  Medications  thiamine (VITAMIN B-1) tablet 100 mg (not administered)    Or  thiamine (B-1) injection 100 mg (not administered)  LORazepam (ATIVAN) injection 0-4 mg (not administered)    Followed by  LORazepam (ATIVAN) injection 0-4 mg (not administered)  LORazepam (ATIVAN) tablet 1 mg (not administered)  acetaminophen (TYLENOL) tablet 650 mg (not administered)  ibuprofen (ADVIL,MOTRIN) tablet 600 mg (not administered)  nicotine (NICODERM CQ - dosed in mg/24 hours) patch 21 mg (not administered)  ondansetron (ZOFRAN) tablet 4 mg (not administered)    New Prescriptions   No medications on file       Harle Battiest, NP 08/26/14 1539  Loren Racer, MD 08/26/14 2312

## 2014-08-26 NOTE — Progress Notes (Addendum)
  CARE MANAGEMENT ED NOTE 08/26/2014  Patient:  Jordan Davidson,Jordan Davidson   Account Number:  [id number]  Date Initiated:  08/26/2014  Documentation initiated by:  Edd ArbourGIBBS,KIMBERLY  Subjective/Objective Assessment:   44 yr old self pay pt from [place] [employer] "he's tired of drinking" and "wants to stop." States his last drink was a few hours ago, and he wants ETOH detox.     Subjective/Objective Assessment Detail:   no pcp listed in EPIC  pt with 2 ED visits and no chs admissions in last 6 months     Action/Plan:   pt noted without pcp and self pay [place] Cm provided resources for [place] [facility] [place] services and [place] self pay resources   Action/Plan Detail:   pt discussed in [facility] progression meeting  d/c plans: [facility] or [facility]   Anticipated DC Date:  08/26/2014     Status Recommendation to Physician:   Result of Recommendation:    Other ED Services  Consult Working Plan   In-house referral  Clinical Social Worker   DC Associate Professorlanning Services  Other  Outpatient Services - Pt will follow up  PCP issues    Choice offered to / List presented to:            Status of service:  Completed, signed off  ED Comments:   ED Comments Detail:  08/26/14 1152 Pt informed C[place] Request CM updated his address in EPIC to [address] [place] [address] CM made [facility] RN aware of change of address [place] EPIC  Follow-up With Details Why Education officer, communityContact Info [place] [facility]   [place] [facility] [address] [place], [place] [address] Phone: [phone] [place] un insured resources    please follow up with resources provided to you in the [facility] to get a family doctor (pcp)  CM spoke with pt who confirms [place] resident with no pcp. CM discussed and provided written information for self pay pcps, importance of pcp for f/u care,  www.needymeds.org, www.goodrx.com, discounted pharmacies and other [place] resources such as [facility], [facility], affordable care act,  financial assistance, DSS and  health department  Reviewed resources for [place] self pay pcps like [facility], family medicine at [address], [facility], general medical clinics, [facility] plus others, medication resources, [facility] out patient pharmacies and housing Pt voiced understanding and appreciation of resources provided  Provided [facility] contact information Agreed to Referral Cm completed referral

## 2014-08-26 NOTE — BH Assessment (Signed)
Went to initiate assessment. Introduced Conservation officer, naturethis writer to pt and explained purpose of assessment. Pt refused to open his eyes stating, "please let me sleep right now" noting he wanted to do the assessment at a later time. RN informed this Clinical research associatewriter [facility]. Will attempt assessment immediately following transfer.    Clista BernhardtNancy Pavel Gadd, [facility] Triage Specialist 08/26/2014 3:50 AM

## 2014-08-26 NOTE — ED Notes (Signed)
Patient lying in bed; denies any current SI/HI/AVH. Presents calm and cooperative. NAD

## 2014-08-26 NOTE — ED Notes (Signed)
Ht- 70"  Wt- 180lb

## 2014-08-26 NOTE — BH Assessment (Signed)
Per Donell SievertSpencer Simon, PA pt meets criteria for [facility] and [facility].   [facility]  Per Andrey CampanileSandy at [facility] they have beds, send referral   Sent referral to [facility], [facility], and [facility].    Clista BernhardtNancy Youlanda Tomassetti, [facility] Triage Specialist 08/26/2014 4:48 AM

## 2014-08-26 NOTE — ED Notes (Signed)
Pt is up ambulating in hallway to restroom w/o difficulty

## 2014-08-26 NOTE — Consult Note (Signed)
[facility] Face-to-Face Psychiatry Consult   Reason for Consult:  Alcohol detox/dependence Referring Physician:  EDP Patient Identification: Jordan Davidson MRN:  [id number] Principal Diagnosis: Alcohol dependence with uncomplicated withdrawal Diagnosis:   Patient Active Problem List   Diagnosis Date Noted  . Alcohol dependence with uncomplicated withdrawal [F10.230] 08/26/2014    Priority: High    Total Time spent with patient: 45 minutes  Subjective:   Jordan Davidson is a 44 y.o. male patient will stabilize and discharge in am.  HPI:  The patient came to the ED for Alcohol detox/dependence, BAL 318.  He had been staying at a [facility], and can return but needs to pay rent.  Kirby wanted to go to [facility], left a couple of weeks ago, for rehab.  He was declined due to recent rehab there.  Mendell denies suicidal/homicidal ideations, hallucinations, and drug issues.  No history of withdrawal seizures or DTs.  [facility] accepted the patient but the patient called them and asked if they would stop by and allow him to get his belongings.  [facility] called the psych office and said they changed their mind and he would have to come straight from the ED to [facility].  The patient agreed but [facility] called back in a few minutes and declined the patient because he had had services in the past two weeks, would not get paid for their services.  Patient will be allowed to stay over night and discharge in the am so he can make arrangements. HPI Elements:   Location:  generalized. Quality:  acute. Severity:  mild. Timing:  intermittent. Duration:  past week or so. Context:  stressors.  Past Medical History:  Past Medical History  Diagnosis Date  . Alcohol abuse     Past Surgical History  Procedure Laterality Date  . No past surgeries     Family History: History reviewed. No pertinent family history. Social History:  History  Alcohol Use  . Yes     History  Drug Use No    History   Social History  .  Marital Status: Legally Separated    Spouse Name: N/A  . Number of Children: N/A  . Years of Education: N/A   Social History Main Topics  . Smoking status: Current Every Day Smoker  . Smokeless tobacco: Not on file  . Alcohol Use: Yes  . Drug Use: No  . Sexual Activity: Not on file   Other Topics Concern  . None   Social History Narrative   Additional Social History:    Pain Medications: SEE PTA Prescriptions: SEE PTA, reports he has been out of his medications for a couple days, reports they were prescribed by [facility]  Over the Counter: SEE PTA History of alcohol / drug use?:  (Pt reports he has used THC and Xanax in the past, reports no use of Xanax for years, and that he can't recall last time he used THC. Pt reports he has been abusing etoh for 30 years. ) Longest period of sobriety (when/how long): uncertain, pt reports possible hx of seizures and DT's when he has tried to detox on his own in the past. Date of last seizure unknown Negative Consequences of Use: Legal, Financial Withdrawal Symptoms:  (none reported currently, came in with BAL of 318)                     Allergies:  No Known Allergies  Vitals: Blood pressure 151/88, pulse 102,  temperature 98.3 F (36.8 C), temperature source Oral, resp. rate 20, SpO2 96 %.  Risk to Self: Suicidal Ideation: No Suicidal Intent: No Is patient at risk for suicide?: No Suicidal Plan?: No Access to Means: No What has been your use of drugs/alcohol within the last 12 months?: Pt has been abusing etoh for more than 30 years. Reports he drinks a couple of fifths per day. Reported to ED staff he sometimes drinks listerine  How many times?: 2 (2004, 2009 overdosed on pills "I was just tired of living") Other Self Harm Risks: none Triggers for Past Attempts: Other (Comment) ("tired of living") Intentional Self Injurious Behavior: None Risk to Others: Homicidal Ideation: No Thoughts of Harm to Others:  No Current Homicidal Intent: No Current Homicidal Plan: No Access to Homicidal Means: No Identified Victim: none History of harm to others?: No Assessment of Violence: None Noted Violent Behavior Description: none Does patient have access to weapons?: No Criminal Charges Pending?: Yes Describe Pending Criminal Charges: tresspassing  Does patient have a court date: Yes Court Date: 09/09/14 Prior Inpatient Therapy: Prior Inpatient Therapy: Yes Prior Therapy Dates: "many times" dates unknown [facility] Reason for Treatment: SA, bipolar per pt Prior Outpatient Therapy: Prior Outpatient Therapy: Yes Prior Therapy Dates: reports attends AA, [facility], denies current providers Prior Therapy Facilty/Provider(s): [facility] Reason for Treatment: SA  Current Facility-Administered Medications  Medication Dose Route Frequency Provider Last Rate Last Dose  . acetaminophen (TYLENOL) tablet 650 mg  650 mg Oral Q4H PRN Harle BattiestElizabeth Tysinger, NP      . ibuprofen (ADVIL,MOTRIN) tablet 600 mg  600 mg Oral Q8H PRN Harle BattiestElizabeth Tysinger, NP      . LORazepam (ATIVAN) injection 0-4 mg  0-4 mg Intravenous 4 times per day Harle BattiestElizabeth Tysinger, NP   0 mg at 08/26/14 0622   Followed by  . [START ON 08/28/2014] LORazepam (ATIVAN) injection 0-4 mg  0-4 mg Intravenous Q12H Harle BattiestElizabeth Tysinger, NP      . LORazepam (ATIVAN) tablet 1 mg  1 mg Oral Q8H PRN Harle BattiestElizabeth Tysinger, NP   1 mg at 08/26/14 1011  . nicotine (NICODERM CQ - dosed in mg/24 hours) patch 21 mg  21 mg Transdermal Daily Harle BattiestElizabeth Tysinger, NP   21 mg at 08/26/14 0935  . ondansetron (ZOFRAN) tablet 4 mg  4 mg Oral Q8H PRN Harle BattiestElizabeth Tysinger, NP      . thiamine (VITAMIN B-1) tablet 100 mg  100 mg Oral Daily Harle BattiestElizabeth Tysinger, NP   100 mg at 08/26/14 1012   Or  . thiamine (B-1) injection 100 mg  100 mg Intravenous Daily Harle BattiestElizabeth Tysinger,  NP       Current Outpatient Prescriptions  Medication Sig Dispense Refill  . FLUoxetine (PROZAC) 20 MG capsule Take 20 mg by mouth daily.    Marland Kitchen. gabapentin (NEURONTIN) 300 MG capsule Take 300 mg by mouth 3 (three) times daily.    Marland Kitchen. OLANZapine (ZYPREXA) 10 MG tablet Take 10 mg by mouth at bedtime.    . traZODone (DESYREL) 100 MG tablet Take 100 mg by mouth at bedtime.    . ondansetron (ZOFRAN ODT) 4 MG disintegrating tablet Take 1 tablet (4 mg total) by mouth every 8 (eight) hours as needed for nausea or vomiting. (Patient not taking: Reported on 08/26/2014) 20 tablet 0    Musculoskeletal: Strength & Muscle Tone: within normal limits Gait & Station: normal Patient leans: N/A  Psychiatric  Specialty Exam:     Blood pressure 151/88, pulse 102, temperature 98.3 F (36.8 C), temperature source Oral, resp. rate 20, SpO2 96 %.There is no height or weight on file to calculate BMI.  General Appearance: Casual  Eye Contact::  Good  Speech:  Normal Rate  Volume:  Normal  Mood:  Anxious, mild  Affect:  Congruent  Thought Process:  Coherent  Orientation:  Full (Time, Place, and Person)  Thought Content:  WDL  Suicidal Thoughts:  No  Homicidal Thoughts:  No  Memory:  Immediate;   Good Recent;   Good Remote;   Good  Judgement:  Fair  Insight:  Fair  Psychomotor Activity:  Normal  Concentration:  Good  Recall:  Good  Fund of Knowledge:Good  Language: Good  Akathisia:  No  Handed:  Right  AIMS (if indicated):     Assets:  Leisure Time Physical Health Resilience  ADL's:  Intact  Cognition: WNL  Sleep:      Medical Decision Making: Review of Psycho-Social Stressors (1), Review or order clinical lab tests (1) and Review of Medication Regimen & Side Effects (2)  Treatment Plan Summary: Daily contact with patient to assess and evaluate symptoms and progress in treatment, Medication management and Plan [facility] accepted the patient then called back and declined, about 5:45 pm  Plan:   Supportive therapy provided about ongoing stressors. Disposition: [facility] accepted the patient then called back and declined, about 5:45 pm  Nanine Means, PMH-NP 08/26/2014 5:34 PM  Patient seen, evaluated by me , treatment plan formulated by me. Patient wants to go to [facility] but declined by them. Patient discharged with outpatient resources Nelly Rout, MD

## 2014-08-26 NOTE — BH Assessment (Signed)
[facility] Assessment Progress Note  At 16:48 Robert at [facility] called.  Pt has been accepted to their facility.  He is to be transported via [facility].  Robert asks pt's nurse to call [phone] as pt is preparing to leave ED.  Pt's nurse has been notified.  Doylene Canninghomas Micole Delehanty, MA Triage Specialist 08/26/2014 @ 16:53

## 2014-08-26 NOTE — ED Notes (Signed)
[facility]

## 2014-08-26 NOTE — BH Assessment (Signed)
[facility] Assessment Progress Note  At 13:47 I [facility] [place] [facility].  Pt has been denied for admission to their facility due to a recent prior admission there.  I also spoke to [facility].  They report that they have beds available and that they have not received a referral for this pt.  Referral is being prepared.  Doylene Canninghomas Negan Grudzien, MA Triage Specialist 08/26/2014 @ 13:50

## 2014-08-27 ENCOUNTER — Encounter (HOSPITAL_COMMUNITY): Payer: Self-pay | Admitting: Emergency Medicine

## 2014-08-27 ENCOUNTER — Emergency Department (HOSPITAL_COMMUNITY)
Admission: EM | Admit: 2014-08-27 | Discharge: 2014-08-27 | Disposition: A | Discharge status: Home / Self Care | Payer: Self-pay | Attending: Emergency Medicine | Admitting: Emergency Medicine

## 2014-08-27 DIAGNOSIS — F1010 Alcohol abuse, uncomplicated: Secondary | ICD-10-CM

## 2014-08-27 DIAGNOSIS — Z79899 Other long term (current) drug therapy: Secondary | ICD-10-CM | POA: Insufficient documentation

## 2014-08-27 DIAGNOSIS — F10920 Alcohol use, unspecified with intoxication, uncomplicated: Secondary | ICD-10-CM

## 2014-08-27 DIAGNOSIS — F10120 Alcohol abuse with intoxication, uncomplicated: Secondary | ICD-10-CM | POA: Insufficient documentation

## 2014-08-27 DIAGNOSIS — F10230 Alcohol dependence with withdrawal, uncomplicated: Secondary | ICD-10-CM

## 2014-08-27 DIAGNOSIS — Z720 Tobacco use: Secondary | ICD-10-CM | POA: Insufficient documentation

## 2014-08-27 LAB — CBC
HCT: 40.9 % (ref 39.0–52.0)
Hemoglobin: 13.9 g/dL (ref 13.0–17.0)
MCH: 32 pg (ref 26.0–34.0)
MCHC: 34 g/dL (ref 30.0–36.0)
MCV: 94 fL (ref 78.0–100.0)
Platelets: 213 10*3/uL (ref 150–400)
RBC: 4.35 MIL/uL (ref 4.22–5.81)
RDW: 13.4 % (ref 11.5–15.5)
WBC: 8.8 10*3/uL (ref 4.0–10.5)

## 2014-08-27 LAB — COMPREHENSIVE METABOLIC PANEL
ALT: 19 U/L (ref 0–53)
ANION GAP: 7 (ref 5–15)
AST: 36 U/L (ref 0–37)
Albumin: 4.2 g/dL (ref 3.5–5.2)
Alkaline Phosphatase: 62 U/L (ref 39–117)
BUN: 12 mg/dL (ref 6–23)
CALCIUM: 8.7 mg/dL (ref 8.4–10.5)
CO2: 27 mmol/L (ref 19–32)
CREATININE: 0.63 mg/dL (ref 0.50–1.35)
Chloride: 103 mmol/L (ref 96–112)
GFR calc Af Amer: >90 mL/min (ref >90)
GFR calc non Af Amer: >90 mL/min (ref >90)
GLUCOSE: 109 mg/dL — AB (ref 70–99)
Potassium: 3.6 mmol/L (ref 3.5–5.1)
Sodium: 137 mmol/L (ref 135–145)
Total Bilirubin: 0.4 mg/dL (ref 0.3–1.2)
Total Protein: 7.4 g/dL (ref 6.0–8.3)

## 2014-08-27 LAB — RAPID URINE DRUG SCREEN, HOSP PERFORMED
AMPHETAMINES: NOT DETECTED
Barbiturates: NOT DETECTED
Benzodiazepines: NOT DETECTED
Cocaine: NOT DETECTED
OPIATES: NOT DETECTED
Tetrahydrocannabinol: NOT DETECTED

## 2014-08-27 LAB — ETHANOL: Alcohol, Ethyl (B): 294 mg/dL — ABNORMAL HIGH (ref 0–9)

## 2014-08-27 LAB — ACETAMINOPHEN LEVEL: Acetaminophen (Tylenol), Serum: <10 ug/mL — ABNORMAL LOW (ref 10–30)

## 2014-08-27 LAB — SALICYLATE LEVEL

## 2014-08-27 NOTE — ED Notes (Signed)
Bed: [facility] Expected date:  Expected time:  Means of arrival:  Comments: 

## 2014-08-27 NOTE — BHH Suicide Risk Assessment (Cosign Needed)
Suicide Risk Assessment  Discharge Assessment   [facility]   Demographic Factors:  Male, Caucasian, Low socioeconomic status, Living alone and Unemployed  Total Time spent with patient: 20 minutes  Musculoskeletal: Strength & Muscle Tone: within normal limits Gait & Station: normal Patient leans: N/A  Psychiatric Specialty Exam:     Blood pressure 155/104, pulse 83, temperature 98.2 F (36.8 C), temperature source Oral, resp. rate 18, SpO2 97 %.There is no height or weight on file to calculate BMI.  General Appearance: Casual  Eye Contact::  Good  Speech:  Clear and Coherent and Normal Rate409  Volume:  Normal  Mood:  Angry  Affect:  Congruent  Thought Process:  Coherent, Goal Directed and Intact  Orientation:  Full (Time, Place, and Person)  Thought Content:  WDL  Suicidal Thoughts:  No  Homicidal Thoughts:  No  Memory:  Immediate;   Good Recent;   Good Remote;   Good  Judgement:  Fair  Insight:  Shallow  Psychomotor Activity:  Normal  Concentration:  Good  Recall:  NA  Fund of Knowledge:Good  Language: Good  Akathisia:  NA  Handed:  Right  AIMS (if indicated):     Assets:  Desire for Improvement  Sleep:     Cognition: WNL  ADL's:  Intact      Has this patient used any form of tobacco in the last 30 days? (Cigarettes, Smokeless Tobacco, Cigars, and/or Pipes) N/A  Mental Status Per Nursing Assessment::   On Admission:     Current Mental Status by Physician: NA  Loss Factors: NA  Historical Factors: NA  Risk Reduction Factors:   Positive coping skills or problem solving skills  Continued Clinical Symptoms:  Depression:   Insomnia  Cognitive Features That Contribute To Risk:  Closed-mindedness and Polarized thinking    Suicide Risk:  Minimal: No identifiable suicidal ideation.  Patients presenting with no risk factors but with morbid ruminations; may be classified as minimal risk based on the severity of the depressive  symptoms  Principal Problem: Alcohol dependence with uncomplicated withdrawal Discharge Diagnoses:  Patient Active Problem List   Diagnosis Date Noted  . Alcohol dependence with uncomplicated withdrawal [F10.230] 08/26/2014    Follow-up Information    Follow [facility] with [facility].   Contact information:   [facility]  [address]  [place], [place] [address] Phone: [phone]      Follow up with [place] [facility] .   Contact information:   please follow up with resources provided to you in the [facility] emergency room to get a family doctor (pcp)       Plan Of Care/Follow-up recommendations:  Activity:  As tolerated Diet:  Regular  Is patient on multiple antipsychotic therapies at discharge:  No   Has Patient had three or more failed trials of antipsychotic monotherapy by history:  No  Recommended Plan for Multiple Antipsychotic Therapies: NA    Pius Byrom, C    PMHNP-BC 08/27/2014, 10:10 AM

## 2014-08-27 NOTE — ED Notes (Signed)
Per EMS, they were called out for an intoxicated subject. On scene pt refused to answer orientation questions, and was brought to the ER.

## 2014-08-27 NOTE — ED Provider Notes (Signed)
CSN: [id number]     Arrival date & time 08/27/14  1431 History   First MD Initiated Contact with Patient 08/27/14 1527     Chief Complaint  Patient presents with  . Alcohol Intoxication     (Consider location/radiation/quality/duration/timing/severity/associated sxs/prior Treatment) Patient is a 44 y.o. male presenting with intoxication. The history is provided by the patient, the EMS personnel and medical records. No language interpreter was used.  Alcohol Intoxication     Gordie Crumby is a 44 y.o. male  with a hx of EtOH abuse presents to the Emergency Department via EMS.  Per EMS they were called for an intoxicated patient.  On scene he refused to answer questions and was brought voluntarily to the Er.  Upon arrival he refused to answer triage questions.  He reports he came here for the "facilities" and the transportation.  He states he wants to go outside.  When asked about medical complaints the patient states "F@#* you."    Per record review patient was last seen 2 days ago requesting detox. At that time he reported drinking a half gallon of alcohol per day including beer, liquor, wine and mouthwash.  He also reports smoking 1 pack of cigarettes per day and denied any recreational drugs.   Past Medical History  Diagnosis Date  . Alcohol abuse    Past Surgical History  Procedure Laterality Date  . No past surgeries     History reviewed. No pertinent family history. History  Substance Use Topics  . Smoking status: Current Every Day Smoker  . Smokeless tobacco: Not on file  . Alcohol Use: Yes    Review of Systems  Unable to perform ROS: Other      Allergies  Review of patient's allergies indicates no known allergies.  Home Medications   Prior to Admission medications   Medication Sig Start Date End Date Taking? Authorizing Provider  FLUoxetine (PROZAC) 20 MG capsule Take 20 mg by mouth daily.    Historical Provider, MD  gabapentin (NEURONTIN) 300 MG capsule Take 300  mg by mouth 3 (three) times daily.    Historical Provider, MD  OLANZapine (ZYPREXA) 10 MG tablet Take 10 mg by mouth at bedtime.    Historical Provider, MD  ondansetron (ZOFRAN ODT) 4 MG disintegrating tablet Take 1 tablet (4 mg total) by mouth every 8 (eight) hours as needed for nausea or vomiting. Patient not taking: Reported on 08/26/2014 08/25/14   Teressa Lower, NP  traZODone (DESYREL) 100 MG tablet Take 100 mg by mouth at bedtime.    Historical Provider, MD   BP 157/101 mmHg  Pulse 94  Temp(Src) 97.9 F (36.6 C) (Oral)  Resp 16  SpO2 94% Physical Exam  Constitutional: He appears well-developed and well-nourished. No distress.  Awake, alert, nontoxic appearance  HENT:  Head: Normocephalic and atraumatic.  Eyes: Conjunctivae are normal. No scleral icterus.  Neck: Normal range of motion.  Cardiovascular: Normal rate, regular rhythm and intact distal pulses.   Pulmonary/Chest: Effort normal. No accessory muscle usage. No respiratory distress.  Equal chest expansion  Musculoskeletal: Normal range of motion.  Neurological: He is alert.  Speech is clear and goal oriented Moves extremities without ataxia Pt ambulates under his own power  Skin: Skin is warm and dry. He is not diaphoretic.  Psychiatric: He is agitated.  Nursing note and vitals reviewed.   ED Course  Procedures (including critical care time) Labs Review Labs Reviewed  ACETAMINOPHEN LEVEL - Abnormal; Notable for the following:  Acetaminophen (Tylenol), Serum <10.0 (*)    All other components within normal limits  COMPREHENSIVE METABOLIC PANEL - Abnormal; Notable for the following:    Glucose, Bld 109 (*)    All other components within normal limits  ETHANOL - Abnormal; Notable for the following:    Alcohol, Ethyl (B) 294 (*)    All other components within normal limits  CBC  SALICYLATE LEVEL  URINE RAPID DRUG SCREEN (HOSP PERFORMED)    Imaging Review No results found.   EKG Interpretation None       MDM   Final diagnoses:  Alcohol intoxication, uncomplicated  Alcohol abuse   Lacey Jensenodd Corcino presents with EtOH intoxication.  Labs reassuring.  EtOH 294.  Pt refuses to answer ROS questions.  He has a strong, regular radial pulse with warm and dry skin.  He ambulates under his own power and has no ataxia.  No trauma noted on observation.  He is moving all extremities and major joints without difficulty.    Pt belligerent refusing to discuss why he is here.  Pt arrested by [employer] and escorted out.    BP 157/101 mmHg  Pulse 94  Temp(Src) 97.9 F (36.6 C) (Oral)  Resp 16  SpO2 94%   Dierdre ForthHannah Karene Bracken, PA-C 08/27/14 1546  Gerhard Munchobert Lockwood, MD 08/27/14 1736

## 2014-08-27 NOTE — ED Notes (Signed)
Patient discharged with resources.  All belongings returned and signed for.

## 2014-08-27 NOTE — ED Notes (Signed)
Bed: WA07 Expected date:  Expected time:  Means of arrival:  Comments: EMS- wants to be seen, will not give complaint

## 2014-08-27 NOTE — ED Notes (Signed)
Pt attempting to get out of the bed and this RN went to assist pt back into the bed. Pt would not cooperate. PA, security, and [employer] at pt's bedside. Pt started to cuss at officer and PA. Assisted into handcuffs by [employer] and escorted off of the property.

## 2014-08-27 NOTE — Consult Note (Signed)
[facility] Face-to-Face Psychiatry Consult   Reason for Consult:  Alcohol detox/dependence Referring Physician:  EDP Patient Identification: Jordan Davidson MRN:  [id number] Principal Diagnosis: Alcohol dependence with uncomplicated withdrawal Diagnosis:   Patient Active Problem List   Diagnosis Date Noted  . Alcohol dependence with uncomplicated withdrawal [F10.230] 08/26/2014    Total Time spent with patient: 45 minutes  Subjective:   Jordan Davidson is a 44 y.o. male patient will stabilize and discharge in am.  HPI:  The patient came to the ED for Alcohol detox/dependence, BAL 318.  He had been staying at a [facility], [facility], and can return but needs to pay rent.  Ryane wanted to go to [facility], left a couple of weeks ago, for rehab.  He was declined due to recent rehab there.  Wardell denies suicidal/homicidal ideations, hallucinations, and drug issues.  No history of withdrawal seizures or DTs.  [facility] accepted the patient but the patient called them and asked if they would stop by and allow him to get his belongings.  [facility] called the psych office and said they changed their mind and he would have to come straight from the ED to [facility].  The patient agreed but [facility] called back in a few minutes and declined the patient because he had had services in the past two weeks, would not get paid for their services.  Patient will be allowed to stay over night and discharge in the am so he can make arrangements.  Reviewed above note with updates added.   Patient denies SI/HI/AVH.  He has been declined by several facilities for detox because he has used up his services there.  Patient is discharged home with referrals for housing and rehabilitation.    HPI Elements:   Location:  generalized. Quality:  acute. Severity:  mild. Timing:  intermittent. Duration:  past week or so. Context:  stressors.  Past Medical History:  Past Medical History  Diagnosis Date  . Alcohol abuse     Past Surgical History   Procedure Laterality Date  . No past surgeries     Family History: History reviewed. No pertinent family history. Social History:  History  Alcohol Use  . Yes     History  Drug Use No    History   Social History  . Marital Status: Legally Separated    Spouse Name: N/A  . Number of Children: N/A  . Years of Education: N/A   Social History Main Topics  . Smoking status: Current Every Day Smoker  . Smokeless tobacco: Not on file  . Alcohol Use: Yes  . Drug Use: No  . Sexual Activity: Not on file   Other Topics Concern  . None   Social History Narrative   Additional Social History:    Pain Medications: SEE PTA Prescriptions: SEE PTA, reports he has been out of his medications for a couple days, reports they were prescribed by [facility]  Over the Counter: SEE PTA History of alcohol / drug use?:  (Pt reports he has used THC and Xanax in the past, reports no use of Xanax for years, and that he can't recall last time he used THC. Pt reports he has been abusing etoh for 30 years. ) Longest period of sobriety (when/how long): uncertain, pt reports possible hx of seizures and DT's when he has tried to detox on his own in the past. Date of last seizure unknown Negative Consequences of Use: Legal, Financial Withdrawal Symptoms:  (none reported  currently, came in with BAL of 318)     Allergies:  No Known Allergies  Vitals: Blood pressure 155/104, pulse 83, temperature 98.2 F (36.8 C), temperature source Oral, resp. rate 18, SpO2 97 %.  Risk to Self: Suicidal Ideation: No Suicidal Intent: No Is patient at risk for suicide?: No Suicidal Plan?: No Access to Means: No What has been your use of drugs/alcohol within the last 12 months?: Pt has been abusing etoh for more than 30 years. Reports he drinks a couple of fifths per day. Reported to ED staff he sometimes drinks listerine  How many times?: 2 (2004, 2009 overdosed on pills "I was just tired of  living") Other Self Harm Risks: none Triggers for Past Attempts: Other (Comment) ("tired of living") Intentional Self Injurious Behavior: None Risk to Others: Homicidal Ideation: No Thoughts of Harm to Others: No Current Homicidal Intent: No Current Homicidal Plan: No Access to Homicidal Means: No Identified Victim: none History of harm to others?: No Assessment of Violence: None Noted Violent Behavior Description: none Does patient have access to weapons?: No Criminal Charges Pending?: Yes Describe Pending Criminal Charges: tresspassing  Does patient have a court date: Yes Court Date: 09/09/14 Prior Inpatient Therapy: Prior Inpatient Therapy: Yes Prior Therapy Dates: "many times" dates unknown Prior Therapy Facilty/Provider(s): [place], [place], [address], [place], [place], [facility], [place], [facility] Reason for Treatment: SA, bipolar per pt Prior Outpatient Therapy: Prior Outpatient Therapy: Yes Prior Therapy Dates: reports attends AA, was prescribed medication at [facility], denies current providers Prior Therapy Facilty/Provider(s): [facility] Reason for Treatment: SA  Current Facility-Administered Medications  Medication Dose Route Frequency Provider Last Rate Last Dose  . acetaminophen (TYLENOL) tablet 650 mg  650 mg Oral Q4H PRN Harle Battiest, NP      . ibuprofen (ADVIL,MOTRIN) tablet 600 mg  600 mg Oral Q8H PRN Harle Battiest, NP      . LORazepam (ATIVAN) injection 0-4 mg  0-4 mg Intravenous 4 times per day Harle Battiest, NP   1 mg at 08/27/14 1610   Followed by  . [START ON 08/28/2014] LORazepam (ATIVAN) injection 0-4 mg  0-4 mg Intravenous Q12H Harle Battiest, NP      . LORazepam (ATIVAN) tablet 1 mg  1 mg Oral Q8H PRN Harle Battiest, NP   1 mg at 08/27/14 9604  . nicotine (NICODERM CQ - dosed in mg/24 hours) patch 21 mg  21 mg Transdermal Daily Harle Battiest, NP   21 mg at 08/26/14 0935  . ondansetron (ZOFRAN) tablet 4 mg  4 mg Oral Q8H PRN Harle Battiest, NP      . thiamine (VITAMIN B-1) tablet 100 mg  100 mg Oral Daily Harle Battiest, NP   100 mg at 08/26/14 1012   Or  . thiamine (B-1) injection 100 mg  100 mg Intravenous Daily Harle Battiest, NP       Current Outpatient Prescriptions  Medication Sig Dispense Refill  . FLUoxetine (PROZAC) 20 MG capsule Take 20 mg by mouth daily.    Marland Kitchen gabapentin (NEURONTIN) 300 MG capsule Take 300 mg by mouth 3 (three) times daily.    Marland Kitchen OLANZapine (ZYPREXA) 10 MG tablet Take 10 mg by mouth at bedtime.    . traZODone (DESYREL) 100 MG tablet Take 100 mg by mouth at bedtime.    . ondansetron (ZOFRAN ODT) 4 MG disintegrating tablet Take 1 tablet (4 mg total) by mouth every 8 (eight) hours as needed for nausea or vomiting. (Patient not taking: Reported on 08/26/2014)  20 tablet 0    Musculoskeletal: Strength & Muscle Tone: within normal limits Gait & Station: normal Patient leans: N/A  Psychiatric Specialty Exam:     Blood pressure 155/104, pulse 83, temperature 98.2 F (36.8 C), temperature source Oral, resp. rate 18, SpO2 97 %.There is no height or weight on file to calculate BMI.  General Appearance: Casual  Eye Contact::  Good  Speech:  Normal Rate  Volume:  Normal  Mood:  Anxious, mild  Affect:  Congruent  Thought Process:  Coherent  Orientation:  Full (Time, Place, and Person)  Thought Content:  WDL  Suicidal Thoughts:  No  Homicidal Thoughts:  No  Memory:  Immediate;   Good Recent;   Good Remote;   Good  Judgement:  Fair  Insight:  Fair  Psychomotor Activity:  Normal  Concentration:  Good  Recall:  Good  Fund of Knowledge:Good  Language: Good  Akathisia:  No  Handed:  Right  AIMS (if indicated):     Assets:  Leisure Time Physical Health Resilience  ADL's:  Intact  Cognition: WNL  Sleep:      Medical Decision Making: Established Problem, Stable/Improving (1)  Treatment Plan Summary: Plan Discharge home  Plan:  Discharge home with outpatient rehabilitation  referrals. Disposition: Discharged home  Dahlia ByesONUOHA, Felice Deem, Salena SanerC, PMHNP-BC 08/27/2014 10:00 AM

## 2014-08-27 NOTE — Discharge Instructions (Addendum)
To help you maintain a sober lifestyle, a substance abuse treatment program may be beneficial to you.  Contact [facility] at your earliest opportunity to see about being admitted to their facility:       [facility]      [address]      [place], [place] [address]      [phone]  If you need treatment while you are waiting for admission to a residential program, contact [facility] to enroll in their outpatient program:       [facility] ([facility])      [address]      [place], [place] [address]      [phone]  If you currently do not have housing, the [facility] offers shelter services:       [facility] ([facility] by [facility])      [address]      [place], [place] [address]      [phone]  For a range of supportive services for the homeless including showers, laundry facilities, mail, and other services, contact the [facility](IRC):       [facility]      [address]      , [place] [address]      [phone]) [phone]

## 2014-08-27 NOTE — BH Assessment (Signed)
B[facility] Assessment Progress Note  Per Carolanne GrumblingGerald Taylor, MD, this pt does not require psychiatric hospitalization.  [facility] with referral information to area substance abuse treatment providers.  Discharge instructions include information for [facility] for residential treatment, and for [facility] for outpatient treatment.  Also included is information for the [facility] homeless shelter and the [facility] for other supportive services for the homeless.  Additionally, pt has been provided with printed information for area Alcoholics Anonymous meetings.  Pt's nurse has been notified.  Doylene Canninghomas Tamarcus Condie, MA Triage Specialist 08/27/2014 @ 10:31

## 2014-08-27 NOTE — ED Notes (Signed)
Pt refusing to answer questions at this time, he refused to answer questions with EMS and [employer] on scene. Blood work drawn, urine sent to lab

## 2014-08-28 ENCOUNTER — Encounter (HOSPITAL_COMMUNITY): Payer: Self-pay

## 2014-08-28 ENCOUNTER — Emergency Department (HOSPITAL_COMMUNITY)
Admission: EM | Admit: 2014-08-28 | Discharge: 2014-08-29 | Disposition: A | Discharge status: Home / Self Care | Payer: Self-pay | Attending: Emergency Medicine | Admitting: Emergency Medicine

## 2014-08-28 ENCOUNTER — Emergency Department (HOSPITAL_COMMUNITY)
Admission: EM | Admit: 2014-08-28 | Discharge: 2014-08-28 | Disposition: A | Discharge status: Home / Self Care | Payer: Self-pay | Attending: Emergency Medicine | Admitting: Emergency Medicine

## 2014-08-28 ENCOUNTER — Encounter (HOSPITAL_COMMUNITY): Payer: Self-pay | Admitting: Family Medicine

## 2014-08-28 DIAGNOSIS — F1010 Alcohol abuse, uncomplicated: Secondary | ICD-10-CM

## 2014-08-28 DIAGNOSIS — Z720 Tobacco use: Secondary | ICD-10-CM | POA: Insufficient documentation

## 2014-08-28 DIAGNOSIS — F10120 Alcohol abuse with intoxication, uncomplicated: Secondary | ICD-10-CM | POA: Insufficient documentation

## 2014-08-28 DIAGNOSIS — Z79899 Other long term (current) drug therapy: Secondary | ICD-10-CM | POA: Insufficient documentation

## 2014-08-28 DIAGNOSIS — F10920 Alcohol use, unspecified with intoxication, uncomplicated: Secondary | ICD-10-CM

## 2014-08-28 DIAGNOSIS — F10129 Alcohol abuse with intoxication, unspecified: Secondary | ICD-10-CM | POA: Insufficient documentation

## 2014-08-28 LAB — CBC
HEMATOCRIT: 42.7 % (ref 39.0–52.0)
Hemoglobin: 14.7 g/dL (ref 13.0–17.0)
MCH: 32.2 pg (ref 26.0–34.0)
MCHC: 34.4 g/dL (ref 30.0–36.0)
MCV: 93.6 fL (ref 78.0–100.0)
Platelets: 200 10*3/uL (ref 150–400)
RBC: 4.56 MIL/uL (ref 4.22–5.81)
RDW: 13.6 % (ref 11.5–15.5)
WBC: 6.8 10*3/uL (ref 4.0–10.5)

## 2014-08-28 LAB — SALICYLATE LEVEL: Salicylate Lvl: <4 mg/dL (ref 2.8–20.0)

## 2014-08-28 LAB — COMPREHENSIVE METABOLIC PANEL
ALK PHOS: 61 U/L (ref 39–117)
ALT: 17 U/L (ref 0–53)
ANION GAP: 11 (ref 5–15)
AST: 30 U/L (ref 0–37)
Albumin: 4.1 g/dL (ref 3.5–5.2)
BILIRUBIN TOTAL: 0.5 mg/dL (ref 0.3–1.2)
BUN: 8 mg/dL (ref 6–23)
CO2: 28 mmol/L (ref 19–32)
CREATININE: 0.65 mg/dL (ref 0.50–1.35)
Calcium: 8.7 mg/dL (ref 8.4–10.5)
Chloride: 101 mmol/L (ref 96–112)
GFR calc non Af Amer: >90 mL/min (ref >90)
GLUCOSE: 97 mg/dL (ref 70–99)
POTASSIUM: 3.5 mmol/L (ref 3.5–5.1)
Sodium: 140 mmol/L (ref 135–145)
TOTAL PROTEIN: 6.9 g/dL (ref 6.0–8.3)

## 2014-08-28 LAB — ACETAMINOPHEN LEVEL

## 2014-08-28 LAB — ETHANOL: ALCOHOL ETHYL (B): 398 mg/dL — AB (ref 0–9)

## 2014-08-28 MED ORDER — SODIUM CHLORIDE 0.9 % IV BOLUS (SEPSIS)
1000.0000 mL | Freq: Once | INTRAVENOUS | Status: AC
  Administered 2014-08-28: 1000 mL via INTRAVENOUS

## 2014-08-28 MED ORDER — THIAMINE HCL 100 MG/ML IJ SOLN
Freq: Once | INTRAVENOUS | Status: AC
  Administered 2014-08-29: 01:00:00 via INTRAVENOUS
  Filled 2014-08-28: qty 1000

## 2014-08-28 NOTE — ED Notes (Signed)
Pt ambulated to BR ans back without difficulty

## 2014-08-28 NOTE — ED Notes (Signed)
Bed: [id number] Expected date:  Expected time:  Means of arrival:  Comments[facility] 44 [facility]/intoxication

## 2014-08-28 NOTE — ED Notes (Signed)
Pt presents with c/o alcohol intoxication. Pt was seen here approx 12 hours ago for same and was escorted off of the property by [employer] for cussing at staff members. [facility] EMS[facility] Pt reports some nausea.

## 2014-08-28 NOTE — Discharge Instructions (Signed)
Go to your recovery house and participate in their therapy.    Emergency Department Resource Guide 1) Find a Doctor and Pay Out of Pocket Although you won't have to find out who is covered by your insurance plan, it is a good idea to ask around and get recommendations. You will then need to call the office and see if the doctor you have chosen will accept you as a new patient and what types of options they offer for patients who are self-pay. Some doctors offer discounts or will set up payment plans for their patients who do not have insurance, but you will need to ask so you aren't surprised when you get to your appointment.  2) Contact Your Local Health Department Not all health departments have doctors that can see patients for sick visits, but many do, so it is worth a call to see if yours does. If you don't know where your local health department is, you can check in your phone book. The CDC also has a tool to help you locate your state's health department, and many state websites also have listings of all of their local health departments.  3) Find a Walk-in Clinic If your illness is not likely to be very severe or complicated, you may want to try a walk in clinic. These are popping up all over the country in pharmacies, drugstores, and shopping centers. They're usually staffed by nurse practitioners or physician assistants that have been trained to treat common illnesses and complaints. They're usually fairly quick and inexpensive. However, if you have serious medical issues or chronic medical problems, these are probably not your best option.  No Primary Care Doctor: - Call Health Connect at  [phone] - they can help you locate a primary care doctor that  accepts your insurance, provides certain services, etc. - Physician Referral Service- [phone]  Chronic Pain Problems: Organization         Address  Phone   Notes  [facility]  [phone] Patients need to be  referred by their primary care doctor.   Medication Assistance: Organization         Address  Phone   Notes  [facility] Medication [facility] [address] [place], [place] [address] [phone] --Must be a resident of [facility] [place] -- Must have NO insurance coverage whatsoever (no Medicaid/ Medicare, etc.) -- The pt. MUST have a primary care doctor that directs their care regularly and follows them in the community   MedAssist  [phone]   [employer]  [phone]    Agencies that provide inexpensive medical care: Organization         Address  Phone   Notes  [facility]  [phone]   [facility]    [phone]   [facility] [address] [place], [place] [address] [phone]   [facility] [address], [place] [phone]) [phone]   [facility]anned [facility]    [phone]   [facility]    [phone]   [facility]  [address], [place] Phone:  [phone], Fax:  [phone] Hours of Operation:  9 am - 6 pm, M-F.  Also accepts Medicaid/Medicare and self-pay.  [facility]  [address], [place] Phone: [phone]) [phone], Fax: [phone]. Hours of Operation:  8:30 am - 5:30  pm, M-F.  Also accepts Medicaid and self-pay.  [facility] [place] [address] [place] Phone: [phone]   [place], [place], [place] [phone], Ext. [phone] Mondays & Thursdays: 7-9 AM.  First 15 patients are seen on a first come, first serve basis.    [place] Providers:  Organization         Address  Phone   Notes  [facility] [address], [place] [phone] Also accepts self-pay patients.  [facility] [address], [place]  [phone]   [place], [address], [place] [phone]   [facility] [address] [place] [phone]   Lucianne Lei [address], [place]   [phone] Only accepts [place] Access [place] patients after they have their name applied to their card.   Self-Pay (no insurance) in [facility]:  Organization         Address  Phone   Notes  Sickle Cell Patients, [facility] [place] [phone]   [facility] [place] [phone]   [facility] Elsie  [place], [place], [place] [phone]   [facility]/Dr. Osei-Bonsu  [address], [place] or [address], [place] [phone] Phone number for both [place] and [place] locations is the same.  Urgent Medical and [facility] [address], [place] [phone]   [facility] [address] [place] or [address] [phone]   [facility] [address], [place] [phone], phone; [phone], fax Sees patients 1st and 3rd Saturday of every month.  Must not qualify for public or private insurance (i.e. Medicaid, Medicare, [place] Health Choice, Veterans' Benefits)  Household income should be no more than 200% of the poverty level The clinic cannot treat you if you are pregnant or think you are pregnant  Sexually transmitted diseases are not treated at the clinic.    Dental Care: Organization         Address  Phone  Notes  [facility] Department of [facility] [place] [phone] Accepts children up to age 17 who are enrolled in [place] or [place]; pregnant women with a Medicaid card; and children who have applied for Medicaid or [place] Health Choice, but were declined, whose parents can  pay a reduced fee at time of service.  [facility] Department of [facility]  [address], [place] [phone] Accepts children up to age 21 who are enrolled in [place] or [place]; pregnant women with a Medicaid card; and children who have applied for Medicaid or [place] Health Choice, but were declined, whose parents can pay a reduced fee at time of service.  [facility] Adult Dental Access PROGRAM  [place] [phone] Patients are seen by appointment only. Walk-ins are not accepted. [place] will see patients 29 years of age and older. Monday - Tuesday (8am-5pm) Most Wednesdays (8:30-5pm) $30 per visit, cash only  [facility] Adult Dental Access PROGRAM  [address], [facility] [phone] Patients are seen by appointment only. Walk-ins are not  accepted. [place] will see patients 12 years of age and older. One Wednesday Evening (Monthly: Volunteer Based).  $30 per visit, cash only  [place]  [phone] for adults; Children under age 30, call Graduate Pediatric Dentistry at [phone]. Children aged 43-14, please call [phone] to request a pediatric application.  Dental services are provided in all areas of dental care including fillings, crowns and bridges, complete and partial dentures, implants, gum treatment, root canals, and extractions. Preventive care is also provided. Treatment is provided to both adults and children. Patients are selected via a lottery and there is often a waiting list.   [facility] [address] [place]  [phone] [email or web address]   [facility] [address], [place] [phone], Ext. [phone] Second and Fourth Thursday of each month, opens at 6:30 AM; Clinic ends at 9 AM.  Patients are seen on a first-come first-served basis, and a limited number are seen during each clinic.   [facility]  [address] [place]er [place], [place] [phone]   Eligibility Requirements You must have lived in [place], [place], or [place] counties for at least the last three months.   You cannot be eligible for state or federal sponsored [employer], including [employer], [place], or [employer].   You generally cannot be eligible for healthcare insurance through your employer.    How to apply: Eligibility screenings are held every Tuesday and Wednesday afternoon from 1:00 pm until 4:00 pm. You do not need an appointment for the interview!  [facility] [address] [place], [place]   Advance  [place] Department  [employer]  [phone]    Behavioral Health Resources in the Community: Intensive Outpatient Programs Organization         Address  Phone  Notes  Ray Edroy. [address], [place], [place] [phone]   [facility] Outpatient [address], [place], [place]   ADS: [facility] [address] [place], [place]   [place] [address]  [place], [place] or [phone]   Substance Abuse Resources Organization         Address  Phone  Notes  [facility]  [phone]   [place]  [phone]   The [place]   Chinita Pester  [phone]   Residential & Outpatient Substance Abuse Program  [phone]   Psychological Services Organization         Address  Phone  Notes  [facility]  [place]  [phone]   [place] [address] [place] or [phone]    Mobile Crisis Teams Organization         Address  Phone  Notes  [facility]atives, Mobile Crisis Care Unit  [phone]    [facility]ive Psychotherapeutic Services  [address] [place], [place]   [place] Levels [address] [place] [phone]    Self-Help/Support Groups Organization         Address  Phone             Notes  [place]. of [place] - variety of support groups  [place] Call for more information  Narcotics Anonymous (NA), Caring  Services [address] [employer] [place]  2 meetings at this location   Residential Facilities manager         Address  Phone  Notes  [facility] [place],    [place]  [phone]   [facility]  [address], [place] [address]709, [place], [place]   [place], [place] [phone] Admissions: 8am-3pm M-F  Incentives Substance [place] [address]    [place], [place] [phone]   [facility] [address] [place], [place], [place]   [facility] [address],  [place], [place]   Insight Programs - Intensive Outpatient [address] Kristeen [address], [place], [place]   [facility] ([place].) [place].,  [place], [place] [phone] or [phone]   [facility] ([facility]) [address], [place], [place] Accepts Medicaid  [facility] [place] [address],  [place] [phone] Substance Abuse/Addiction Treatment   [facility] Organization         Address  Phone  Notes  [facility]  [phone]   Domenic Schwab, PhD [address] Arlis Porta [place], [place]   [phone] or [phone]   [place], [place] [phone]   [facility] [address], [place], [place] [phone] Insurance/Medicaid/sponsorship through [facility] and Families [address] [place]                                     [place], [place] [phone] [place] [address], [place] [phone]    Dr. Adele Schilder  [phone]   [facility] Dept. 1) [address], [place] 2) [place] 3)  [address] [place] [address], [place] [phone]  [phone]   [place] [phone] or [phone] (After Hours)

## 2014-08-28 NOTE — ED Provider Notes (Signed)
CSN: [id number]     Arrival date & time 08/28/14  1718 History   First MD Initiated Contact with Patient 08/28/14 1938     Chief Complaint  Patient presents with  . Alcohol Intoxication     (Consider location/radiation/quality/duration/timing/severity/associated sxs/prior Treatment) Patient is a 44 y.o. male presenting with intoxication. The history is provided by the patient. No language interpreter was used.  Alcohol Intoxication Pertinent negatives include no abdominal pain, chest pain, nausea, rash or vomiting.  Jordan Davidson is a 44 y/o white male with a history of alcohol intoxication and presents today with alcohol intoxication after being found by EMS in the ambulance bay sleeping just prior to arrival in the ED. He states, "I drank a lot today." He denies any injury, no nausea, no vomiting, no abdominal pain, no chest pain, no shortness of breath. He has not had any prior treatment.   Past Medical History  Diagnosis Date  . Alcohol abuse    Past Surgical History  Procedure Laterality Date  . No past surgeries     History reviewed. No pertinent family history. History  Substance Use Topics  . Smoking status: Current Every Day Smoker  . Smokeless tobacco: Not on file  . Alcohol Use: Yes    Review of Systems  Respiratory: Negative for chest tightness, shortness of breath and wheezing.   Cardiovascular: Negative for chest pain and leg swelling.  Gastrointestinal: Negative for nausea, vomiting and abdominal pain.  Skin: Negative for rash and wound.  All other systems reviewed and are negative.     Allergies  Review of patient's allergies indicates no known allergies.  Home Medications   Prior to Admission medications   Medication Sig Start Date End Date Taking? Authorizing Provider  FLUoxetine (PROZAC) 20 MG capsule Take 20 mg by mouth daily.    Historical Provider, MD  gabapentin (NEURONTIN) 300 MG capsule Take 300 mg by mouth 3 (three) times daily.    Historical  Provider, MD  OLANZapine (ZYPREXA) 10 MG tablet Take 10 mg by mouth at bedtime.    Historical Provider, MD  ondansetron (ZOFRAN ODT) 4 MG disintegrating tablet Take 1 tablet (4 mg total) by mouth every 8 (eight) hours as needed for nausea or vomiting. 08/25/14   Teressa LowerVrinda Pickering, NP  traZODone (DESYREL) 100 MG tablet Take 100 mg by mouth at bedtime.    Historical Provider, MD   BP 104/70 mmHg  Pulse 72  Temp(Src) 97.7 F (36.5 C)  Resp 18  SpO2 96% Physical Exam  Constitutional: He is oriented to person, place, and time. He appears well-developed and well-nourished.  Patient is not cooperative and wants to sleep. He answers questions but falls asleep in between. He refuses PO fluids or food.   HENT:  Head: Normocephalic and atraumatic.  Eyes: Conjunctivae are normal.  Neck: Normal range of motion. Neck supple.  Cardiovascular: Normal rate, regular rhythm and normal heart sounds.   Pulmonary/Chest: Effort normal and breath sounds normal.  Abdominal: Soft. There is no tenderness.  Musculoskeletal: Normal range of motion.  Neurological: He is alert and oriented to person, place, and time.  He is able to move all extremities while laying in bed.   Skin: Skin is warm and dry.  He has no signs of injury to the abdomen or back.   Nursing note and vitals reviewed.   ED Course  Procedures (including critical care time) Labs Review Labs Reviewed  ETHANOL - Abnormal; Notable for the following:    Alcohol, Ethyl (  B) 398 (*)    All other components within normal limits  ACETAMINOPHEN LEVEL - Abnormal; Notable for the following:    Acetaminophen (Tylenol), Serum <10.0 (*)    All other components within normal limits  CBC  COMPREHENSIVE METABOLIC PANEL  SALICYLATE LEVEL  URINE RAPID DRUG SCREEN (HOSP PERFORMED)    Imaging Review No results found.   EKG Interpretation None      MDM   Final diagnoses:  None   Patient is sleepy but arousalable. He is able to answer questions.   He has no signs of injury to abdomen or back. He states he does not want help. Tylenol and salicylate levels are normal.  CBC and electrolytes are within normal limits.  Ethanol level is 398 so I gave him a bolus of fluids.  He refuses PO fluids or food.   23:10 Patient is still sleeping but easily aroused. A second bolus of fluids has been started. 12:00 I ordered a banana bag for the patient.   12:40 The plan is to wait until this patient is able to ambulate with appropriate decision making capabilities. I have discussed and transferred care of this patient to Jordan Davidson.  Catha Gosselin, PA-C 08/29/14 0046  Jordan Fossa, MD 08/29/14 [phone]

## 2014-08-28 NOTE — ED Notes (Signed)
Pt here for ETOH. Was found sitting outside the EMS bay. Pt intoxicated. Pt sts he wants help from alcohol. Denies SI. Pt seen multiple times in the past few days for same.

## 2014-08-28 NOTE — ED Notes (Signed)
Pt started on a second liter of fluid.

## 2014-08-28 NOTE — ED Provider Notes (Signed)
CSN: [id number]     Arrival date & time 08/28/14  0251 History   First MD Initiated Contact with Patient 08/28/14 0255     Chief Complaint  Patient presents with  . Alcohol Intoxication   Level V caveat for alcohol intoxication.  (Consider location/radiation/quality/duration/timing/severity/associated sxs/prior Treatment) HPI   This is [name]. He was seen yesterday and at that time was verbally abusive and aggressive. He was arrested by the police. Nurses report he has his jail release papers on him when he was brought back in by EMS. Evidently patient left jail and went straight to [employer] and bought mouthwash which he was drinking. Patient is sleeping in no distress. When I ask him where he is currently he states "I'm in the detox center". Patient states he drinks "pretty much". He states it is not a problem however.   Past Medical History  Diagnosis Date  . Alcohol abuse    Past Surgical History  Procedure Laterality Date  . No past surgeries     No family history on file. History  Substance Use Topics  . Smoking status: Current Every Day Smoker  . Smokeless tobacco: Not on file  . 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     Allergies  Review of patient's allergies indicates no known allergies.  Home Medications   Prior to Admission medications   Medication Sig Start Date End Date Taking? Authorizing Provider  FLUoxetine (PROZAC) 20 MG capsule Take 20 mg by mouth daily.   Yes Historical Provider, MD  gabapentin (NEURONTIN) 300 MG capsule Take 300 mg by mouth 3 (three) times daily.   Yes Historical Provider, MD  OLANZapine (ZYPREXA) 10 MG tablet Take 10 mg by mouth at bedtime.   Yes Historical Provider, MD  ondansetron (ZOFRAN ODT) 4 MG disintegrating tablet Take 1 tablet (4 mg total)  by mouth every 8 (eight) hours as needed for nausea or vomiting. 08/25/14  Yes Teressa LowerVrinda Pickering, NP  traZODone (DESYREL) 100 MG tablet Take 100 mg by mouth at bedtime.   Yes Historical Provider, MD   BP 149/117 mmHg  Pulse 80  Temp(Src) 97.6 F (36.4 C) (Oral)  Resp 16  SpO2 97%  Vital signs normal   Physical Exam  Constitutional: He appears well-developed and well-nourished.  Non-toxic appearance. He does not appear ill. No distress.  HENT:  Head: Normocephalic and atraumatic.  Right Ear: External ear normal.  Left Ear: External ear normal.  Nose: Nose normal. No mucosal edema or rhinorrhea.  Mouth/Throat: Oropharynx is clear and moist and mucous membranes are normal. No dental abscesses or uvula swelling.  Eyes: Conjunctivae and EOM are normal. Pupils are equal, round, and reactive to light.  Pupils dilated but reactive  Neck: Normal range of motion and full passive range of motion without pain. Neck supple.  Cardiovascular: Normal rate, regular rhythm and normal heart sounds.  Exam reveals no gallop and no friction rub.   No murmur heard. Pulmonary/Chest: Effort normal and breath sounds normal. No respiratory distress. He has no wheezes. He has no rhonchi. He has no rales. He exhibits no tenderness and no crepitus.  Abdominal: Soft. Normal appearance and bowel sounds are normal. He exhibits no distension. There is no tenderness. There is no rebound and no guarding.  Musculoskeletal: Normal range of motion.  He exhibits no edema or tenderness.  Moves all extremities well.   Neurological: He is alert. He has normal strength. No cranial nerve deficit.  Patient is sleeping, when he is awakened he states he's "in the detox center".  Skin: Skin is warm, dry and intact. No rash noted. No erythema. No pallor.  Psychiatric: His affect is blunt. His speech is slurred. He is slowed.  Nursing note and vitals reviewed.   ED Course  Procedures (including critical care time)   6:30 AM.  Patient is easily awakened. He knows where he is. He appears to be much more sober than he was earlier. His speech is normal without slurring. She was a related by nursing staff and he was able to ambulated without difficulty and without assistance. Patient states he is in a recovery house and he will go back there once he is sober.   Labs Review  Labs Reviewed - No data to display  Imaging Review No results found.   EKG Interpretation None      MDM   Final diagnoses:  Alcohol intoxication, uncomplicated    Plan discharge  Devoria Albe, MD, Franz Dell, MD 08/28/14 [phone]

## 2014-08-29 LAB — RAPID URINE DRUG SCREEN, HOSP PERFORMED
Amphetamines: NOT DETECTED
Barbiturates: NOT DETECTED
Benzodiazepines: POSITIVE — AB
COCAINE: NOT DETECTED
Opiates: NOT DETECTED
TETRAHYDROCANNABINOL: NOT DETECTED

## 2014-08-29 NOTE — ED Notes (Signed)
Walked patient he did well

## 2014-08-29 NOTE — ED Provider Notes (Addendum)
Pt assumed from Jordan HarborShari Upstill,PA-C  Patient to ED for intoxication. Currently waiting to sober and eat breakfast tray. Received a banana bag last night. 6: 45 am- ETOH > 300 on arrival yesterday. Order for ambulation pending.  Filed Vitals:   08/29/14 0600  BP: 119/74  Pulse: 91  Temp:   Resp:    7:25 pm Patient ambulated in the ED and did well per tech. I spoke directly with patient and he is a wake and looks well. Will be discharged with outpatient resources.  44 y.o.[name]'s evaluation in the Emergency Department is complete. It has been determined that no acute conditions requiring further emergency intervention are present at this time. The patient/guardian have been advised of the diagnosis and plan. We have discussed signs and symptoms that warrant return to the ED, such as changes or worsening in symptoms.  Vital signs are stable at discharge. Filed Vitals:   08/29/14 0717  BP: 142/91  Pulse: 106  Temp:   Resp: 20    Patient/guardian has voiced understanding and agreed to follow-up with the PCP or specialist.   Dorthula Matasiffany G Nyiah Pianka, PA-C 08/29/14 [phone]  Shon Batonourtney F Horton, MD 08/29/14 [phone]  Dorthula Matasiffany G Gates Jividen, PA-C 08/29/14 [phone]  Shon Batonourtney F Horton, MD 08/29/14 [phone]

## 2014-08-29 NOTE — ED Provider Notes (Signed)
Banana bag going Waiting to sober  Patient care transferred to Marlon Peliffany Greene, PA-C for re-assessment later this morning.   Arnoldo HookerShari A Leonor Darnell, PA-C 08/29/14 0603  Arnoldo HookerShari A Sahiba Granholm, PA-C 08/29/14 [phone]  Tilden FossaElizabeth Rees, MD 08/29/14 [phone]

## 2014-08-29 NOTE — Discharge Instructions (Signed)
Alcohol and Nutrition °Nutrition serves two purposes. It provides energy. It also maintains body structure and function. Food supplies energy. It also provides the building blocks needed to replace worn or damaged cells. Alcoholics often eat poorly. This limits their supply of essential nutrients. This affects energy supply and structure maintenance. Alcohol also affects the body's nutrients in: °· Digestion. °· Storage. °· Using and getting rid of waste products. °IMPAIRMENT OF NUTRIENT DIGESTION AND UTILIZATION  °· Once ingested, food must be broken down into small components (digested). Then it is available for energy. It helps maintain body structure and function. Digestion begins in the mouth. It continues in the stomach and intestines, with help from the pancreas. The nutrients from digested food are absorbed from the intestines into the blood. Then they are carried to the liver. The liver prepares nutrients for: °¨ Immediate use. °¨ Storage and future use. °· Alcohol inhibits the breakdown of nutrients into usable molecules. °¨ It decreases secretion of digestive enzymes from the pancreas. °· Alcohol impairs nutrient absorption by damaging the cells lining the stomach and intestines. °· It also interferes with moving some nutrients into the blood. °· In addition, nutritional deficiencies themselves may lead to further absorption problems. °¨ For example, folate deficiency changes the cells that line the small intestine. This impairs how water is absorbed. It also affects absorbed nutrients. These include glucose, sodium, and additional folate. °· Even if nutrients are digested and absorbed, alcohol can prevent them from being fully used. It changes their transport, storage, and excretion. Impaired utilization of nutrients by alcoholics is indicated by: °¨ Decreased liver stores of vitamins, such as vitamin A. °¨ Increased excretion of nutrients such as fat. °ALCOHOL AND ENERGY SUPPLY  °· Three basic  nutritional components found in food are: °¨ Carbohydrates. °¨ Proteins. °¨ Fats. °· These are used as energy. Some alcoholics take in as much as 50% of their total daily calories from alcohol. They often neglect important foods. °· Even when enough food is eaten, alcohol can impair the ways the body controls blood sugar (glucose) levels. It may either increase or decrease blood sugar. °¨ In non-diabetic alcoholics, increased blood sugar (hyperglycemia) is caused by poor insulin secretion. It is usually temporary. °¨ Decreased blood sugar (hypoglycemia) can cause serious injury even if this condition is short-lived. Low blood sugar can happen when a fasting or malnourished person drinks alcohol. When there is no food to supply energy, stored sugar is used up. The products of alcohol inhibit forming glucose from other compounds such as amino acids. As a result, alcohol causes the brain and other body tissue to lack glucose. It is needed for energy and function. °· Alcohol is an energy source. But how the body processes and uses the energy from alcohol is complex. Also, when alcohol is substituted for carbohydrates, subjects tend to lose weight. This indicates that they get less energy from alcohol than from food. °ALCOHOL - MAINTAINING CELL STRUCTURE AND FUNCTION  °Structure °Cells are made mostly of protein. So an adequate protein diet is important for maintaining cell structure. This is especially true if cells are being damaged. Research indicates that alcohol affects protein nutrition by causing impaired: °· Digestion of proteins to amino acids. °· Processing of amino acids by the small intestine and liver. °· Synthesis of proteins from amino acids. °· Protein secretion by the liver. °Function °Nutrients are essential for the body to function well. They provide the tools that the body needs to work well:  °·   Proteins. °· Vitamins. °· Minerals. °Alcohol can disrupt body function. It may cause nutrient  deficiencies. And it may interfere with the way nutrients are processed. °Vitamins °· Vitamins are essential to maintain growth and normal metabolism. They regulate many of the body`s processes. Chronic heavy drinking causes deficiencies in many vitamins. This is caused by eating less. And, in some cases, vitamins may be poorly absorbed. For example, alcohol inhibits fat absorption. It impairs how the vitamins A, E, and D are normally absorbed along with dietary fats. Not enough vitamin A may cause night blindness. Not enough vitamin D may cause softening of the bones. °· Some alcoholics lack vitamins A, C, D, E, K, and the B vitamins. These are all involved in wound healing and cell maintenance. In particular, because vitamin K is necessary for blood clotting, lacking that vitamin can cause delayed clotting. The result is excess bleeding. Lacking other vitamins involved in brain function may cause severe neurological damage. °Minerals °Deficiencies of minerals such as calcium, magnesium, iron, and zinc are common in alcoholics. The alcohol itself does not seem to affect how these minerals are absorbed. Rather, they seem to occur secondary to other alcohol-related problems, such as: °· Less calcium absorbed. °· Not enough magnesium. °· More urinary excretion. °· Vomiting. °· Diarrhea. °· Not enough iron due to gastrointestinal bleeding. °· Not enough zinc or losses related to other nutrient deficiencies. °· Mineral deficiencies can cause a variety of medical consequences. These range from calcium-related bone disease to zinc-related night blindness and skin lesions. °ALCOHOL, MALNUTRITION, AND MEDICAL COMPLICATIONS  °Liver Disease  °· Alcoholic liver damage is caused primarily by alcohol itself. But poor nutrition may increase the risk of alcohol-related liver damage. For example, nutrients normally found in the liver are known to be affected by drinking alcohol. These include carotenoids, which are the major  sources of vitamin A, and vitamin E compounds. Decreases in such nutrients may play some role in alcohol-related liver damage. °Pancreatitis °· Research suggests that malnutrition may increase the risk of developing alcoholic pancreatitis. Research suggests that a diet lacking in protein may increase alcohol's damaging effect on the pancreas. °Brain °· Nutritional deficiencies may have severe effects on brain function. These may be permanent. Specifically, thiamine deficiencies are often seen in alcoholics. They can cause severe neurological problems. These include: °¨ Impaired movement. °¨ Memory loss seen in Wernicke-Korsakoff syndrome. °Pregnancy °· Alcohol has toxic effects on fetal development. It causes alcohol-related birth defects. They include fetal alcohol syndrome. Alcohol itself is toxic to the fetus. Also, the nutritional deficiency can affect how the fetus develops. That may compound the risk of developmental damage. °· Nutritional needs during pregnancy are 10% to 30% greater than normal. Food intake can increase by as much as 140% to cover the needs of both mother and fetus. An alcoholic mother`s nutritional problems may adversely affect the nutrition of the fetus. And alcohol itself can also restrict nutrition flow to the fetus. °NUTRITIONAL STATUS OF ALCOHOLICS  °Techniques for assessing nutritional status include: °· Taking body measurements to estimate fat reserves. They include: °¨ Weight. °¨ Height. °¨ Mass. °¨ Skin fold thickness. °· Performing blood analysis to provide measurements of circulating: °¨ Proteins. °¨ Vitamins. °¨ Minerals. °· These techniques tend to be imprecise. For many nutrients, there is no clear "cut-off" point that would allow an accurate definition of deficiency. So assessing the nutritional status of alcoholics is limited by these techniques. Dietary status may provide information about the risk of developing nutritional problems.   Dietary status is assessed by: °¨ Taking  patients' dietary histories. °¨ Evaluating the amount and types of food they are eating. °· It is difficult to determine what exact amount of alcohol begins to have damaging effects on nutrition. In general, moderate drinkers have 2 drinks or less per day. They seem to be at little risk for nutritional problems. Various medical disorders begin to appear at greater levels. °· Research indicates that the majority of even the heaviest drinkers have few obvious nutritional deficiencies. Many alcoholics who are hospitalized for medical complications of their disease do have severe malnutrition. Alcoholics tend to eat poorly. Often they eat less than the amounts of food necessary to provide enough: °¨ Carbohydrates. °¨ Protein. °¨ Fat. °¨ Vitamins A and C. °¨ B vitamins. °¨ Minerals like calcium and iron. °Of major concern is alcohol's effect on digesting food and use of nutrients. It may shift a mildly malnourished person toward severe malnutrition. °Document Released: 04/06/2005 Document Revised: 09/04/2011 Document Reviewed: 09/20/2005 °ExitCare® Patient Information ©2015 [employer]. This information is not intended to replace advice given to you by your health care provider. Make sure you discuss any questions you have with your health care provider. ° °

## 2014-08-29 NOTE — ED Notes (Signed)
Security notified re: pts c/o missing ID, security provided name & checked if the pts ID had been turned in, security went to lobby to speak with pt & the pt had left

## 2014-08-29 NOTE — ED Notes (Signed)
Pt states,"I need my wallet. I lost it when I have been here a few times before & need it." pt denies having ID upon arrival to ED at this visit, this RN explained to the pt that we can call security, but if he is unable to state when & where he lost his ID we may not be able to help him

## 2014-08-30 ENCOUNTER — Emergency Department (HOSPITAL_COMMUNITY)
Admission: EM | Admit: 2014-08-30 | Discharge: 2014-08-30 | Disposition: A | Discharge status: Home / Self Care | Payer: Self-pay | Attending: Emergency Medicine | Admitting: Emergency Medicine

## 2014-08-30 ENCOUNTER — Emergency Department (HOSPITAL_COMMUNITY)
Admission: EM | Admit: 2014-08-30 | Discharge: 2014-08-31 | Disposition: A | Discharge status: Home / Self Care | Payer: Self-pay | Attending: Emergency Medicine | Admitting: Emergency Medicine

## 2014-08-30 ENCOUNTER — Encounter (HOSPITAL_COMMUNITY): Payer: Self-pay | Admitting: Emergency Medicine

## 2014-08-30 ENCOUNTER — Emergency Department (HOSPITAL_COMMUNITY): Payer: Self-pay

## 2014-08-30 ENCOUNTER — Encounter (HOSPITAL_COMMUNITY): Payer: Self-pay | Admitting: *Deleted

## 2014-08-30 DIAGNOSIS — Z79899 Other long term (current) drug therapy: Secondary | ICD-10-CM | POA: Insufficient documentation

## 2014-08-30 DIAGNOSIS — F10929 Alcohol use, unspecified with intoxication, unspecified: Secondary | ICD-10-CM

## 2014-08-30 DIAGNOSIS — Z720 Tobacco use: Secondary | ICD-10-CM | POA: Insufficient documentation

## 2014-08-30 DIAGNOSIS — F10920 Alcohol use, unspecified with intoxication, uncomplicated: Secondary | ICD-10-CM

## 2014-08-30 DIAGNOSIS — F10129 Alcohol abuse with intoxication, unspecified: Secondary | ICD-10-CM | POA: Insufficient documentation

## 2014-08-30 DIAGNOSIS — F10120 Alcohol abuse with intoxication, uncomplicated: Secondary | ICD-10-CM | POA: Insufficient documentation

## 2014-08-30 NOTE — ED Notes (Signed)
Bed: [facility]08 Expected date:  Expected time:  Means of arrival:  Comments: 

## 2014-08-30 NOTE — ED Notes (Signed)
Bed: [facility] Expected date:  Expected time:  Means of arrival:  Comments: etoh 

## 2014-08-30 NOTE — ED Provider Notes (Signed)
Patient received in sign out from Dr. Mora Bellmanni at shift change.  Briefly 44 year old male here acutely intoxicated.  He was found lying on the floor of a gas station and appeared confused. CT head was ordered which is negative for acute findings. Patient had full workup in the ED yesterday, therefore labs were not repeated.  Plan: allow to sober, d/c when awake and ambulatory.  9:41 AM Patient awake and alert.  He is ambulating in the halls with steady gait.  Patient will be d/c home.  Jordan HatchetLisa M Raelan Burgoon, PA-C 08/30/14 1205  Lorre NickAnthony Allen, MD 09/02/14 2153

## 2014-08-30 NOTE — ED Notes (Signed)
PT brought in by EMS for complaints of ETOH; pt found outside drinking Listerine; pt has been seen multiple times over the last several days for the same thing

## 2014-08-30 NOTE — Discharge Instructions (Signed)
Alcohol Intoxication Mr. Jordan Davidson, follow up with a primary care physician within 3 days for help to quit drinking.  If symptoms worsen, come back to the ED immediately.  Thank you. Alcohol intoxication occurs when you drink enough alcohol that it affects your ability to function. It can be mild or very severe. Drinking a lot of alcohol in a short time is called binge drinking. This can be very harmful. Drinking alcohol can also be more dangerous if you are taking medicines or other drugs. Some of the effects caused by alcohol may include:  Loss of coordination.  Changes in mood and behavior.  Unclear thinking.  Trouble talking (slurred speech).  Throwing up (vomiting).  Confusion.  Slowed breathing.  Twitching and shaking (seizures).  Loss of consciousness. HOME CARE  Do not drive after drinking alcohol.  Drink enough water and fluids to keep your pee (urine) clear or pale yellow. Avoid caffeine.  Only take medicine as told by your doctor. GET HELP IF:  You throw up (vomit) many times.  You do not feel better after a few days.  You frequently have alcohol intoxication. Your doctor can help decide if you should see a substance use treatment counselor. GET HELP RIGHT AWAY IF:  You become shaky when you stop drinking.  You have twitching and shaking.  You throw up blood. It may look bright red or like coffee grounds.  You notice blood in your poop (bowel movements).  You become lightheaded or pass out (faint). MAKE SURE YOU:   Understand these instructions.  Will watch your condition.  Will get help right away if you are not doing well or get worse. Document Released: 11/29/2007 Document Revised: 02/12/2013 Document Reviewed: 11/15/2012 [facility] Patient Information 2015 [employer], [place]. This information is not intended to replace advice given to you by your health care provider. Make sure you discuss any questions you have with your health care provider.

## 2014-08-30 NOTE — ED Provider Notes (Signed)
CSN: [id number]     Arrival date & time 08/30/14  2034 History  This chart was scribed for non-physician practitioner, Earley FavorGail Taveon Enyeart, FNP,working with No att. providers found, by Karle PlumberJennifer Tensley, ED Scribe. This patient was seen in room WTR4/WLPT4 and the patient's care was started at 9:02 PM.  Chief Complaint  Patient presents with  . Alcohol Intoxication   Patient is a 44 y.o. male presenting with intoxication. The history is provided by the patient and medical records. No language interpreter was used.  Alcohol Intoxication    LEVEL 5 CAVEAT- Full history could not be obtained due to intoxication.  HPI Comments:  Jordan Davidson is a 44 y.o. male with PMHx of alcohol abuse brought in by EMS, who presents to the Emergency Department complaining of alcohol intoxication. Upon entering the room, patient is asleep and hard to arouse. When asked if he was trying to kill himself he answered no.   Past Medical History  Diagnosis Date  . Alcohol abuse    Past Surgical History  Procedure Laterality Date  . No past surgeries     No family history on file. History  Substance Use Topics  . Smoking status: Current Every Day Smoker  . Smokeless tobacco: Not on file  . Alcohol Use: Yes     Comment: daily    LEVEL 5 CAVEAT- Full history could not be obtained due to intoxication.  Review of Systems  Unable to perform ROS   Allergies  Review of patient's allergies indicates no known allergies.  Home Medications   Prior to Admission medications   Medication Sig Start Date End Date Taking? Authorizing Provider  FLUoxetine (PROZAC) 20 MG capsule Take 20 mg by mouth daily.   Yes Historical Provider, MD  gabapentin (NEURONTIN) 300 MG capsule Take 300 mg by mouth 3 (three) times daily.   Yes Historical Provider, MD  OLANZapine (ZYPREXA) 10 MG tablet Take 10 mg by mouth at bedtime.   Yes Historical Provider, MD  ondansetron (ZOFRAN ODT) 4 MG disintegrating tablet Take 1 tablet (4 mg total) by mouth  every 8 (eight) hours as needed for nausea or vomiting. Patient not taking: Reported on 08/30/2014 08/25/14   Teressa LowerVrinda Pickering, NP  traZODone (DESYREL) 100 MG tablet Take 100 mg by mouth at bedtime.    Historical Provider, MD   BP 120/61 mmHg  Pulse 62  Resp 18  SpO2 99% Physical Exam  Constitutional: He is oriented to person, place, and time. He appears well-developed and well-nourished.  HENT:  Head: Normocephalic and atraumatic.  Eyes: EOM are normal.  Neck: Normal range of motion.  Cardiovascular: Normal rate.   Pulmonary/Chest: Effort normal.  Musculoskeletal: Normal range of motion.  Neurological: He is alert and oriented to person, place, and time.  Skin: Skin is warm and dry.  Psychiatric: He has a normal mood and affect. His behavior is normal.  Nursing note and vitals reviewed.   ED Course  Procedures (including critical care time) DIAGNOSTIC STUDIES:   COORDINATION OF CARE: 9:07 PM- Will reassess pt once he sobers up some. Pt verbalizes understanding and agrees to plan.  Medications - No data to display  Labs Review Labs Reviewed - No data to display  Imaging Review Ct Head Wo Contrast  08/30/2014   CLINICAL DATA:  Unresponsive.  Ethanol intoxication.  EXAM: CT HEAD WITHOUT CONTRAST  TECHNIQUE: Contiguous axial images were obtained from the base of the skull through the vertex without intravenous contrast.  COMPARISON:  03/05/2012  FINDINGS:  Skull and Sinuses:No traumatic findings.  Mild, chronic appearing mucosal thickening in right maxillary and anterior ethmoid sinuses.  Orbits: No acute abnormality.  Brain: No evidence of acute infarction, hemorrhage, hydrocephalus, or mass lesion/mass effect. There is brain atrophy, particularly of the cerebellum. This pattern correlates with history of alcohol dependence.  IMPRESSION: 1. No acute intracranial findings. 2. Brain atrophy, as above.   Electronically Signed   By: Marnee Spring M.D.   On: 08/30/2014 06:14     EKG  Interpretation None     Patient has been sleeping comfortably.  He's been up several times to the bathroom.  He has eaten a sandwich, no nausea, vomiting MDM   Final diagnoses:  Alcohol intoxication, uncomplicated       I personally performed the services described in this documentation, which was scribed in my presence. The recorded information has been reviewed and is accurate.    Arman Filter, NP 08/31/14 [id number]  Olivia Mackie, MD 08/31/14 [phone]

## 2014-08-30 NOTE — ED Notes (Signed)
Patient was found by gas station attendant found patient and said patient was not responsive. When EMS got there patient could not tell where he was at but admitted to intoxication. Patient is intoxicated

## 2014-08-30 NOTE — ED Provider Notes (Signed)
CSN: [id number]     Arrival date & time 08/30/14  0341 History   First MD Initiated Contact with Patient 08/30/14 0403     Chief Complaint  Patient presents with  . Alcohol Intoxication    Patient was found by gas station attendant found patient and said patient was not responsive. When EMS got there patient could not tell where he was at but admitted to intoxication. Patient is intoxicated.     (Consider location/radiation/quality/duration/timing/severity/associated sxs/prior Treatment) HPI Jordan Davidson is a 44 y.o. male with past medical history of alcohol abuse here after being found unresponsive. Patient cannot give his own history due to alcohol intoxication. EMS states he was found by a gas station laying on the floor and confused. Patient currently denies pain anywhere or falling.   Past Medical History  Diagnosis Date  . Alcohol abuse    Past Surgical History  Procedure Laterality Date  . No past surgeries     History reviewed. No pertinent family history. History  Substance Use Topics  . Smoking status: Current Every Day Smoker  . Smokeless tobacco: Not on file  . Alcohol Use: Yes    Review of Systems  Unable to perform ROS: Mental status change      Allergies  Review of patient's allergies indicates no known allergies.  Home Medications   Prior to Admission medications   Medication Sig Start Date End Date Taking? Authorizing Provider  FLUoxetine (PROZAC) 20 MG capsule Take 20 mg by mouth daily.    Historical Provider, MD  gabapentin (NEURONTIN) 300 MG capsule Take 300 mg by mouth 3 (three) times daily.    Historical Provider, MD  OLANZapine (ZYPREXA) 10 MG tablet Take 10 mg by mouth at bedtime.    Historical Provider, MD  ondansetron (ZOFRAN ODT) 4 MG disintegrating tablet Take 1 tablet (4 mg total) by mouth every 8 (eight) hours as needed for nausea or vomiting. 08/25/14   Teressa LowerVrinda Pickering, NP  traZODone (DESYREL) 100 MG tablet Take 100 mg by mouth at bedtime.     Historical Provider, MD   BP 120/93 mmHg  Pulse 78  Temp(Src) 98 F (36.7 C) (Oral)  Resp 16  Ht 6' (1.829 m)  Wt 160 lb (72.576 kg)  BMI 21.70 kg/m2  SpO2 99% Physical Exam  Constitutional: Vital signs are normal. He appears well-developed and well-nourished.  Non-toxic appearance. He does not appear ill. No distress.  Clinically intoxicated  HENT:  Head: Normocephalic and atraumatic.  Nose: Nose normal.  Mouth/Throat: Oropharynx is clear and moist. No oropharyngeal exudate.  Eyes: Conjunctivae and EOM are normal. Pupils are equal, round, and reactive to light. No scleral icterus.  Neck: Normal range of motion. Neck supple. No tracheal deviation, no edema, no erythema and normal range of motion present. No thyroid mass and no thyromegaly present.  Cardiovascular: Normal rate, regular rhythm, S1 normal, S2 normal, normal heart sounds, intact distal pulses and normal pulses.  Exam reveals no gallop and no friction rub.   No murmur heard. Pulses:      Radial pulses are 2+ on the right side, and 2+ on the left side.       Dorsalis pedis pulses are 2+ on the right side, and 2+ on the left side.  Pulmonary/Chest: Effort normal and breath sounds normal. No respiratory distress. He has no wheezes. He has no rhonchi. He has no rales.  Abdominal: Soft. Normal appearance and bowel sounds are normal. He exhibits no distension, no ascites and no  mass. There is no hepatosplenomegaly. There is no tenderness. There is no rebound, no guarding and no CVA tenderness.  Musculoskeletal: Normal range of motion. He exhibits no edema or tenderness.  Lymphadenopathy:    He has no cervical adenopathy.  Neurological: He has normal strength.  Drowsy but easily arousable. Able to follow commands. Normal strength and sensation 4 extremities.  Skin: Skin is warm, dry and intact. No petechiae and no rash noted. He is not diaphoretic. No erythema. No pallor.  Psychiatric: He has a normal mood and affect. His  behavior is normal. Judgment normal.  Nursing note and vitals reviewed.   ED Course  Procedures (including critical care time) Labs Review Labs Reviewed - No data to display  Imaging Review Ct Head Wo Contrast  08/30/2014   CLINICAL DATA:  Unresponsive.  Ethanol intoxication.  EXAM: CT HEAD WITHOUT CONTRAST  TECHNIQUE: Contiguous axial images were obtained from the base of the skull through the vertex without intravenous contrast.  COMPARISON:  03/05/2012  FINDINGS: Skull and Sinuses:No traumatic findings.  Mild, chronic appearing mucosal thickening in right maxillary and anterior ethmoid sinuses.  Orbits: No acute abnormality.  Brain: No evidence of acute infarction, hemorrhage, hydrocephalus, or mass lesion/mass effect. There is brain atrophy, particularly of the cerebellum. This pattern correlates with history of alcohol dependence.  IMPRESSION: 1. No acute intracranial findings. 2. Brain atrophy, as above.   Electronically Signed   By: Marnee Spring M.D.   On: 08/30/2014 06:14     EKG Interpretation None      MDM   Final diagnoses:  Alcohol intoxication    Patient since emergency department for alcohol intoxication. He has multiple ED visits in the past 3 days for the same. Will observe until clinically sober. CT scan of head was ordered and is negative for any acute injury. Patient will be signed out to oncoming provider please see their note for ultimate disposition of this patient. It is my belief that when the patient is clinically sober and able to ambulate without difficulty is safe for discharge.       Tomasita Crumble, MD 08/30/14 [phone]

## 2014-08-30 NOTE — ED Notes (Signed)
Bed: WA06 Expected date:  Expected time:  Means of arrival:  Comments: EMS found sleeping in gas station/confused

## 2014-08-31 ENCOUNTER — Encounter (HOSPITAL_COMMUNITY): Payer: Self-pay | Admitting: Emergency Medicine

## 2014-08-31 ENCOUNTER — Emergency Department (HOSPITAL_COMMUNITY)
Admission: EM | Admit: 2014-08-31 | Discharge: 2014-09-01 | Disposition: A | Discharge status: Home / Self Care | Payer: Self-pay | Attending: Emergency Medicine | Admitting: Emergency Medicine

## 2014-08-31 DIAGNOSIS — Z5900 Homelessness unspecified: Secondary | ICD-10-CM

## 2014-08-31 DIAGNOSIS — F10220 Alcohol dependence with intoxication, uncomplicated: Secondary | ICD-10-CM

## 2014-08-31 DIAGNOSIS — Z720 Tobacco use: Secondary | ICD-10-CM | POA: Insufficient documentation

## 2014-08-31 DIAGNOSIS — Z79899 Other long term (current) drug therapy: Secondary | ICD-10-CM | POA: Insufficient documentation

## 2014-08-31 DIAGNOSIS — F10120 Alcohol abuse with intoxication, uncomplicated: Secondary | ICD-10-CM | POA: Insufficient documentation

## 2014-08-31 LAB — CBC WITH DIFFERENTIAL/PLATELET
Basophils Absolute: 0 10*3/uL (ref 0.0–0.1)
Basophils Relative: 1 % (ref 0–1)
EOS ABS: 0.1 10*3/uL (ref 0.0–0.7)
EOS PCT: 1 % (ref 0–5)
HCT: 44.1 % (ref 39.0–52.0)
Hemoglobin: 14.7 g/dL (ref 13.0–17.0)
Lymphocytes Relative: 40 % (ref 12–46)
Lymphs Abs: 2.9 10*3/uL (ref 0.7–4.0)
MCH: 31.4 pg (ref 26.0–34.0)
MCHC: 33.3 g/dL (ref 30.0–36.0)
MCV: 94.2 fL (ref 78.0–100.0)
MONOS PCT: 6 % (ref 3–12)
Monocytes Absolute: 0.5 10*3/uL (ref 0.1–1.0)
Neutro Abs: 3.8 10*3/uL (ref 1.7–7.7)
Neutrophils Relative %: 53 % (ref 43–77)
Platelets: 177 10*3/uL (ref 150–400)
RBC: 4.68 MIL/uL (ref 4.22–5.81)
RDW: 13.6 % (ref 11.5–15.5)
WBC: 7.3 10*3/uL (ref 4.0–10.5)

## 2014-08-31 LAB — URINALYSIS, ROUTINE W REFLEX MICROSCOPIC
BILIRUBIN URINE: NEGATIVE
Glucose, UA: NEGATIVE mg/dL
HGB URINE DIPSTICK: NEGATIVE
KETONES UR: NEGATIVE mg/dL
Leukocytes, UA: NEGATIVE
Nitrite: NEGATIVE
PROTEIN: NEGATIVE mg/dL
Specific Gravity, Urine: 1.011 (ref 1.005–1.030)
UROBILINOGEN UA: 0.2 mg/dL (ref 0.0–1.0)
pH: 5.5 (ref 5.0–8.0)

## 2014-08-31 LAB — ETHANOL: ALCOHOL ETHYL (B): 388 mg/dL — AB (ref 0–9)

## 2014-08-31 LAB — BASIC METABOLIC PANEL
Anion gap: 10 (ref 5–15)
BUN: 14 mg/dL (ref 6–23)
CHLORIDE: 109 mmol/L (ref 96–112)
CO2: 27 mmol/L (ref 19–32)
CREATININE: 0.63 mg/dL (ref 0.50–1.35)
Calcium: 8.8 mg/dL (ref 8.4–10.5)
GFR calc Af Amer: >90 mL/min (ref >90)
GFR calc non Af Amer: >90 mL/min (ref >90)
GLUCOSE: 97 mg/dL (ref 70–99)
POTASSIUM: 3.7 mmol/L (ref 3.5–5.1)
Sodium: 146 mmol/L — ABNORMAL HIGH (ref 135–145)

## 2014-08-31 LAB — RAPID URINE DRUG SCREEN, HOSP PERFORMED
Amphetamines: NOT DETECTED
Barbiturates: NOT DETECTED
Benzodiazepines: NOT DETECTED
COCAINE: NOT DETECTED
Opiates: NOT DETECTED
TETRAHYDROCANNABINOL: NOT DETECTED

## 2014-08-31 MED ORDER — THIAMINE HCL 100 MG/ML IJ SOLN
Freq: Once | INTRAVENOUS | Status: AC
  Administered 2014-08-31: 19:00:00 via INTRAVENOUS
  Filled 2014-08-31: qty 1000

## 2014-08-31 NOTE — Discharge Instructions (Signed)
Alcohol Intoxication °Alcohol intoxication occurs when you drink enough alcohol that it affects your ability to function. It can be mild or very severe. Drinking a lot of alcohol in a short time is called binge drinking. This can be very harmful. Drinking alcohol can also be more dangerous if you are taking medicines or other drugs. Some of the effects caused by alcohol may include: °· Loss of coordination. °· Changes in mood and behavior. °· Unclear thinking. °· Trouble talking (slurred speech). °· Throwing up (vomiting). °· Confusion. °· Slowed breathing. °· Twitching and shaking (seizures). °· Loss of consciousness. °HOME CARE °· Do not drive after drinking alcohol. °· Drink enough water and fluids to keep your pee (urine) clear or pale yellow. Avoid caffeine. °· Only take medicine as told by your doctor. °GET HELP IF: °· You throw up (vomit) many times. °· You do not feel better after a few days. °· You frequently have alcohol intoxication. Your doctor can help decide if you should see a substance use treatment counselor. °GET HELP RIGHT AWAY IF: °· You become shaky when you stop drinking. °· You have twitching and shaking. °· You throw up blood. It may look bright red or like coffee grounds. °· You notice blood in your poop (bowel movements). °· You become lightheaded or pass out (faint). °MAKE SURE YOU:  °· Understand these instructions. °· Will watch your condition. °· Will get help right away if you are not doing well or get worse. °Document Released: 11/29/2007 Document Revised: 02/12/2013 Document Reviewed: 11/15/2012 °ExitCare® Patient Information ©2015 [employer]. This information is not intended to replace advice given to you by your health care provider. Make sure you discuss any questions you have with your health care provider. ° °

## 2014-08-31 NOTE — ED Notes (Signed)
Per EMS pt ETOH; found with Listerine at side. Pt alert to self and slurs words.

## 2014-08-31 NOTE — ED Notes (Signed)
[facility] Expected date:  Expected time:  Means of arrival:  Comments: EMS- ETOH intoxication

## 2014-08-31 NOTE — ED Notes (Signed)
Pt awakes to verbal but falls asleep easily. Ammonia inhalent used pt verbalized names, states in no pain, denies SI/HI.

## 2014-08-31 NOTE — Progress Notes (Signed)
  CARE MANAGEMENT ED NOTE 08/31/2014  Patient:  Jordan Davidson,Jordan Davidson   Account Number:  [id number]  Date Initiated:  08/31/2014  Documentation initiated by:  Radford PaxFERRERO,Abdon Petrosky  Subjective/Objective Assessment:   Patient presents to Ed with ETOH intoxication     Subjective/Objective Assessment Detail:   Patient noted to have 8 ED visits within the last six months.  Patient with pmhx of alcohok abuse.  Patient discharged from Ed on 03/03, came back on 03/04 for intoxication     Action/Plan:   Action/Plan Detail:   Anticipated DC Date:       Status Recommendation to Physician:   Result of Recommendation:    Other ED Services  Consult Working Plan    DC Planning Services  Other  PCP issues    Choice offered to / List presented to:            Status of service:  Completed, signed off  ED Comments:   ED Comments Detail:  Patient listed as not having insurnace or a pcp. EDCM following up to see if patient has used resources given to him by day shift EDCM, however patient would not answer EDCM, only mo[facility] when [facility].  The following community resources were placed at patient's bedside:  [facility], informed patient of services there and walk in times.  EDCM also provided patient with list of pcps who accept self pay patients, list of discount pharmacies and websites needymeds.org and GoodRX.com for medication assistance, phone number to inquire about the orange card, phone number to inquire about Mediciad, phone number to inquire about the Affordable Care Act, financial resources in the community such as local churches, [employer], urban ministries, and dental assistance for uninsured patients. No further EDCM needs at this time   .

## 2014-08-31 NOTE — ED Notes (Signed)
John EMT assisted pt to void in nearby restroom. Pt ambulatory with stand by assist.

## 2014-08-31 NOTE — Discharge Instructions (Signed)
Finding Treatment for Alcohol and Drug Addiction It can be hard to find the right place to get professional treatment. Here are some important things to consider:  There are different types of treatment to choose from.  Some programs are live-in (residential) while others are not (outpatient). Sometimes a combination is offered.  No single type of program is right for everyone.  Most treatment programs involve a combination of education, counseling, and a 12-step, spiritually-based approach.  There are non-spiritually based programs (not 12-step).  Some treatment programs are government sponsored. They are geared for patients without private insurance.  Treatment programs can vary in many respects such as:  Cost and types of insurance accepted.  Types of on-site medical services offered.  Length of stay, setting, and size.  Overall philosophy of treatment. A person may need specialized treatment or have needs not addressed by all programs. For example, adolescents need treatment appropriate for their age. Other people have secondary disorders that must be managed as well. Secondary conditions can include mental illness, such as depression or diabetes. Often, a period of detoxification from alcohol or drugs is needed. This requires medical supervision and not all programs offer this. THINGS TO CONSIDER WHEN SELECTING A TREATMENT PROGRAM   Is the program certified by the appropriate government agency? Even private programs must be certified and employ certified professionals.  Does the program accept your insurance? If not, can a payment plan be set up?  Is the facility clean, organized, and well run? Do they allow you to speak with graduates who can share their treatment experience with you? Can you tour the facility? Can you meet with staff?  Does the program meet the full range of individual needs?  Does the treatment program address sexual orientation and physical disabilities?  Do they provide age, gender, and culturally appropriate treatment services?  Is treatment available in languages other than English?  Is long-term aftercare support or guidance encouraged and provided?  Is assessment of an individual's treatment plan ongoing to ensure it meets changing needs?  Does the program use strategies to encourage reluctant patients to remain in treatment long enough to increase the likelihood of success?  Does the program offer counseling (individual or group) and other behavioral therapies?  Does the program offer medicine as part of the treatment regimen, if needed?  Is there ongoing monitoring of possible relapse? Is there a defined relapse prevention program? Are services or referrals offered to family members to ensure they understand addiction and the recovery process? This would help them support the recovering individual.  Are 12-step meetings held at the center or is transport available for patients to attend outside meetings? In countries outside of the [place], Magazine features editorsee local directories for contact information for services in your area. Document Released: 05/11/2005 Document Revised: 09/04/2011 Document Reviewed: 11/21/2007 [facility] Patient Information 2015 [place], [place]. This information is not intended to replace advice given to you by your health care provider. Make sure you discuss any questions you have with your health care provider.  Alcohol Intoxication Alcohol intoxication occurs when the amount of alcohol that a person has consumed impairs his or her ability to mentally and physically function. Alcohol directly impairs the normal chemical activity of the brain. Drinking large amounts of alcohol can lead to changes in mental function and behavior, and it can cause many physical effects that can be harmful.  Alcohol intoxication can range in severity from mild to very severe. Various factors can affect the level of  intoxication that occurs,  such as the person's age, gender, weight, frequency of alcohol consumption, and the presence of other medical conditions (such as diabetes, seizures, or heart conditions). Dangerous levels of alcohol intoxication may occur when people drink large amounts of alcohol in a short period (binge drinking). Alcohol can also be especially dangerous when combined with certain prescription medicines or "recreational" drugs. SIGNS AND SYMPTOMS Some common signs and symptoms of mild alcohol intoxication include:  Loss of coordination.  Changes in mood and behavior.  Impaired judgment.  Slurred speech. As alcohol intoxication progresses to more severe levels, other signs and symptoms will appear. These may include:  Vomiting.  Confusion and impaired memory.  Slowed breathing.  Seizures.  Loss of consciousness. DIAGNOSIS  Your health care provider will take a medical history and perform a physical exam. You will be asked about the amount and type of alcohol you have consumed. Blood tests will be done to measure the concentration of alcohol in your blood. In many places, your blood alcohol level must be lower than 80 mg/dL (1.61%0.08%) to legally drive. However, many dangerous effects of alcohol can occur at much lower levels.  TREATMENT  People with alcohol intoxication often do not require treatment. Most of the effects of alcohol intoxication are temporary, and they go away as the alcohol naturally leaves the body. Your health care provider will monitor your condition until you are stable enough to go home. Fluids are sometimes given through an IV access tube to help prevent dehydration.  HOME CARE INSTRUCTIONS  Do not drive after drinking alcohol.  Stay hydrated. Drink enough water and fluids to keep your urine clear or pale yellow. Avoid caffeine.   Only take over-the-counter or prescription medicines as directed by your health care provider.  SEEK MEDICAL CARE IF:   You have persistent  vomiting.   You do not feel better after a few days.  You have frequent alcohol intoxication. Your health care provider can help determine if you should see a substance use treatment counselor. SEEK IMMEDIATE MEDICAL CARE IF:   You become shaky or tremble when you try to stop drinking.   You shake uncontrollably (seizure).   You throw up (vomit) blood. This may be bright red or may look like black coffee grounds.   You have blood in your stool. This may be bright red or may appear as a black, tarry, bad smelling stool.   You become lightheaded or faint.  MAKE SURE YOU:   Understand these instructions.  Will watch your condition.  Will get help right away if you are not doing well or get worse. Document Released: 03/22/2005 Document Revised: 02/12/2013 Document Reviewed: 11/15/2012 [facility] Patient Information 2015 [place], [place]LL[place]. This information is not intended to replace advice given to you by your health care provider. Make sure you discuss any questions you have with your health care provider.

## 2014-08-31 NOTE — ED Provider Notes (Signed)
CSN: [id number]     Arrival date & time 08/31/14  1725 History   First MD Initiated Contact with Patient 08/31/14 1801     Chief Complaint  Patient presents with  . Alcohol Intoxication     (Consider location/radiation/quality/duration/timing/severity/associated sxs/prior Treatment) HPI Patient denies knowing why he is in the emergency department. He awakens to voice and light stimulus but he is not providing any useful history. History from EMS is that the patient was found poorly responsive with a bottle of Listerine beside him. He has a history of alcoholism. Past Medical History  Diagnosis Date  . Alcohol abuse    Past Surgical History  Procedure Laterality Date  . No past surgeries     No family history on file. History  Substance Use Topics  . Smoking status: Current Every Day Smoker  . Smokeless tobacco: Not on file  . Alcohol Use: Yes     Comment: daily    Review of Systems  Unable to obtain due to level V caveat.  Allergies  Review of patient's allergies indicates no known allergies.  Home Medications   Prior to Admission medications   Medication Sig Start Date End Date Taking? Authorizing Provider  FLUoxetine (PROZAC) 20 MG capsule Take 20 mg by mouth daily.    Historical Provider, MD  gabapentin (NEURONTIN) 300 MG capsule Take 300 mg by mouth 3 (three) times daily.    Historical Provider, MD  OLANZapine (ZYPREXA) 10 MG tablet Take 10 mg by mouth at bedtime.    Historical Provider, MD  ondansetron (ZOFRAN ODT) 4 MG disintegrating tablet Take 1 tablet (4 mg total) by mouth every 8 (eight) hours as needed for nausea or vomiting. Patient not taking: Reported on 08/30/2014 08/25/14   Teressa LowerVrinda Pickering, NP  traZODone (DESYREL) 100 MG tablet Take 100 mg by mouth at bedtime.    Historical Provider, MD   BP 95/54 mmHg  Pulse 84  Resp 16  SpO2 95% Physical Exam  Constitutional: He appears well-developed and well-nourished.  The patient smells of alcohol. He is sleeping  on the stretcher. He awakens to voice and light stimulus.  HENT:  Head: Normocephalic and atraumatic.  Right Ear: External ear normal.  Left Ear: External ear normal.  Nose: Nose normal.  Mouth/Throat: Oropharynx is clear and moist.  Eyes: EOM are normal. Pupils are equal, round, and reactive to light.  Bilateral sclerae are injected.  Neck: Neck supple.  Cardiovascular: Normal rate, regular rhythm, normal heart sounds and intact distal pulses.   Pulmonary/Chest: Effort normal and breath sounds normal.  Abdominal: Soft. Bowel sounds are normal. He exhibits no distension. There is no tenderness.  Musculoskeletal: Normal range of motion. He exhibits no edema or tenderness.  Neurological: He has normal strength. No cranial nerve deficit. He exhibits normal muscle tone. Coordination normal. GCS eye subscore is 4. GCS verbal subscore is 5. GCS motor subscore is 6.  The patient is sleeping but he awakens to voice and can respond to questions appropriately.  Skin: Skin is warm, dry and intact.    ED Course  Procedures (including critical care time) Labs Review Labs Reviewed  ETHANOL - Abnormal; Notable for the following:    Alcohol, Ethyl (B) 388 (*)    All other components within normal limits  BASIC METABOLIC PANEL - Abnormal; Notable for the following:    Sodium 146 (*)    All other components within normal limits  CBC WITH DIFFERENTIAL/PLATELET  URINE RAPID DRUG SCREEN (HOSP PERFORMED)  URINALYSIS,  ROUTINE W REFLEX MICROSCOPIC    Imaging Review Ct Head Wo Contrast  08/30/2014   CLINICAL DATA:  Unresponsive.  Ethanol intoxication.  EXAM: CT HEAD WITHOUT CONTRAST  TECHNIQUE: Contiguous axial images were obtained from the base of the skull through the vertex without intravenous contrast.  COMPARISON:  03/05/2012  FINDINGS: Skull and Sinuses:No traumatic findings.  Mild, chronic appearing mucosal thickening in right maxillary and anterior ethmoid sinuses.  Orbits: No acute abnormality.   Brain: No evidence of acute infarction, hemorrhage, hydrocephalus, or mass lesion/mass effect. There is brain atrophy, particularly of the cerebellum. This pattern correlates with history of alcohol dependence.  IMPRESSION: 1. No acute intracranial findings. 2. Brain atrophy, as above.   Electronically Signed   By: Marnee Spring M.D.   On: 08/30/2014 06:14     EKG Interpretation None      MDM   Final diagnoses:  Alcohol intoxication in active alcoholic, uncomplicated  Homeless   The patient is a chronic alcoholic. At this point time I don't find any evidence of acute injury. The patient presents with elevated alcohol level consistent with prior presentations. He was treated with a banana bag and observation. The patient is discharged with instructions for pursuing treatment.   Arby Barrette, MD 08/31/14 [phone]

## 2014-08-31 NOTE — ED Notes (Signed)
Awake. Verbally responsive. A/O x4. Resp even and unlabored. No audible adventitious breath sounds noted. ABC's intact.  

## 2014-09-01 ENCOUNTER — Emergency Department (HOSPITAL_COMMUNITY)
Admission: EM | Admit: 2014-09-01 | Discharge: 2014-09-01 | Discharge status: Against Medical Advice | Payer: Self-pay | Attending: Emergency Medicine | Admitting: Emergency Medicine

## 2014-09-01 ENCOUNTER — Encounter (HOSPITAL_COMMUNITY): Payer: Self-pay | Admitting: Emergency Medicine

## 2014-09-01 ENCOUNTER — Emergency Department (HOSPITAL_COMMUNITY)
Admission: EM | Admit: 2014-09-01 | Discharge: 2014-09-01 | Disposition: A | Discharge status: Home / Self Care | Payer: Self-pay | Attending: Emergency Medicine | Admitting: Emergency Medicine

## 2014-09-01 DIAGNOSIS — Z79899 Other long term (current) drug therapy: Secondary | ICD-10-CM | POA: Insufficient documentation

## 2014-09-01 DIAGNOSIS — Z9119 Patient's noncompliance with other medical treatment and regimen: Secondary | ICD-10-CM | POA: Insufficient documentation

## 2014-09-01 DIAGNOSIS — Z720 Tobacco use: Secondary | ICD-10-CM | POA: Insufficient documentation

## 2014-09-01 DIAGNOSIS — F10920 Alcohol use, unspecified with intoxication, uncomplicated: Secondary | ICD-10-CM

## 2014-09-01 DIAGNOSIS — F10129 Alcohol abuse with intoxication, unspecified: Secondary | ICD-10-CM | POA: Insufficient documentation

## 2014-09-01 DIAGNOSIS — F10220 Alcohol dependence with intoxication, uncomplicated: Secondary | ICD-10-CM | POA: Insufficient documentation

## 2014-09-01 NOTE — ED Notes (Signed)
Pt ambulating in hallway. Barbara CowerJason AD speaking with pt. Pt up for discharge and leaving department without discharge instructions.

## 2014-09-01 NOTE — ED Notes (Signed)
Per Dr Effie ShyWentz don't need to obtain any labs on pt. Just monitor pt.

## 2014-09-01 NOTE — ED Notes (Addendum)
Per EMS [place].  Pt was trying to get on a bus and wasn't able to ambulate by himself, so the staff escorted pt to office and called EMS.  Pt CBG 136

## 2014-09-01 NOTE — ED Notes (Signed)
Went to move pt to Christ[facility] Telle[facility] bed, pt not in room. Pt not found in any bathrooms in department or outside of the facility.

## 2014-09-01 NOTE — ED Notes (Signed)
Pt ambulated without any difficulty. Pt ate his entire lunch.

## 2014-09-01 NOTE — ED Notes (Signed)
Patient given discharge instructions and resources from case management at discharge. Patient ambulated out of the department under his own power.

## 2014-09-01 NOTE — Discharge Instructions (Signed)
Alcohol Intoxication °Alcohol intoxication occurs when you drink enough alcohol that it affects your ability to function. It can be mild or very severe. Drinking a lot of alcohol in a short time is called binge drinking. This can be very harmful. Drinking alcohol can also be more dangerous if you are taking medicines or other drugs. Some of the effects caused by alcohol may include: °· Loss of coordination. °· Changes in mood and behavior. °· Unclear thinking. °· Trouble talking (slurred speech). °· Throwing up (vomiting). °· Confusion. °· Slowed breathing. °· Twitching and shaking (seizures). °· Loss of consciousness. °HOME CARE °· Do not drive after drinking alcohol. °· Drink enough water and fluids to keep your pee (urine) clear or pale yellow. Avoid caffeine. °· Only take medicine as told by your doctor. °GET HELP IF: °· You throw up (vomit) many times. °· You do not feel better after a few days. °· You frequently have alcohol intoxication. Your doctor can help decide if you should see a substance use treatment counselor. °GET HELP RIGHT AWAY IF: °· You become shaky when you stop drinking. °· You have twitching and shaking. °· You throw up blood. It may look bright red or like coffee grounds. °· You notice blood in your poop (bowel movements). °· You become lightheaded or pass out (faint). °MAKE SURE YOU:  °· Understand these instructions. °· Will watch your condition. °· Will get help right away if you are not doing well or get worse. °Document Released: 11/29/2007 Document Revised: 02/12/2013 Document Reviewed: 11/15/2012 °ExitCare® Patient Information ©2015 [employer]. This information is not intended to replace advice given to you by your health care provider. Make sure you discuss any questions you have with your health care provider. ° °

## 2014-09-01 NOTE — ED Provider Notes (Signed)
CSN: [id number]     Arrival date & time 09/01/14  1228 History   First MD Initiated Contact with Patient 09/01/14 1235     Chief Complaint  Patient presents with  . Alcohol Intoxication     (Consider location/radiation/quality/duration/timing/severity/associated sxs/prior Treatment) HPI   Jordan Davidson is a 44 y.o. male who presents for evaluation of altered mental status. Patient was in the ED earlier, but left without being seen. He was reported to be too intoxicated to get on a bus. He's had numerous visits to the emergency department in the last 8 days. He drinks heavily and presents with very high alcohol levels. He's been seen by behavioral health, but does not follow-up with recommended treatment plans for alcohol cessation. He is being treated for vitamin deficiencies numerous times. He does not have any particular complaints at this time.  Level V Caveat- altered mental status   Past Medical History  Diagnosis Date  . Alcohol abuse    Past Surgical History  Procedure Laterality Date  . No past surgeries     No family history on file. History  Substance Use Topics  . Smoking status: Current Every Day Smoker  . Smokeless tobacco: Not on file  . Alcohol Use: Yes     Comment: daily    Review of Systems  Unable to perform ROS     Allergies  Review of patient's allergies indicates no known allergies.  Home Medications   Prior to Admission medications   Medication Sig Start Date End Date Taking? Authorizing Provider  FLUoxetine (PROZAC) 20 MG capsule Take 20 mg by mouth daily.    Historical Provider, MD  gabapentin (NEURONTIN) 300 MG capsule Take 300 mg by mouth 3 (three) times daily.    Historical Provider, MD  OLANZapine (ZYPREXA) 10 MG tablet Take 10 mg by mouth at bedtime.    Historical Provider, MD  ondansetron (ZOFRAN ODT) 4 MG disintegrating tablet Take 1 tablet (4 mg total) by mouth every 8 (eight) hours as needed for nausea or vomiting. Patient not taking:  Reported on 08/30/2014 08/25/14   Teressa LowerVrinda Pickering, NP  traZODone (DESYREL) 100 MG tablet Take 100 mg by mouth at bedtime.    Historical Provider, MD   BP 110/66 mmHg  Pulse 89  Temp(Src) 98.3 F (36.8 C) (Oral)  Resp 14  SpO2 93% Physical Exam  Constitutional: He is oriented to person, place, and time. He appears well-developed and well-nourished.  Sitting in a chair with his head leaned onto the arm rest, arouses easily. He cooperates with physical examination  HENT:  Head: Normocephalic and atraumatic.  Right Ear: External ear normal.  Left Ear: External ear normal.  Eyes: Conjunctivae and EOM are normal. Pupils are equal, round, and reactive to light.  Neck: Normal range of motion and phonation normal. Neck supple.  Cardiovascular: Normal rate, regular rhythm and normal heart sounds.   Pulmonary/Chest: Effort normal and breath sounds normal. He exhibits no bony tenderness.  Abdominal: Soft. There is no tenderness.  Musculoskeletal: Normal range of motion.  Neurological: He is alert and oriented to person, place, and time. No cranial nerve deficit or sensory deficit. He exhibits normal muscle tone. Coordination normal.  Dysarthria consistent with alcohol intoxication. No distinct ataxia or aphasia.  Skin: Skin is warm, dry and intact.  Psychiatric: He has a normal mood and affect. His behavior is normal.  Nursing note and vitals reviewed.   ED Course  Procedures (including critical care time)  12:55- Evaluation consistent with  recurrent alcohol intoxication, and noncompliance with recommended treatments. There is no reason to do any laboratory testing at this time. He will be kept safe until he becomes sober enough to walk and leave the emergency department.   13:45- nursing ambulated the patient, he did well and walked out of the hospital doors without staying for his paperwork.  Labs Review Labs Reviewed - No data to display  Imaging Review No results found.   EKG  Interpretation None      MDM   Final diagnoses:  Alcohol intoxication, uncomplicated    Alcoholism, recurrent, acute intoxication.  Nursing Notes Reviewed/ Care Coordinated Applicable Imaging Reviewed Interpretation of Laboratory Data incorporated into ED treatment  The patient appears reasonably screened and/or stabilized for discharge and I doubt any other medical condition or other [facility] requiring further screening, evaluation, or treatment in the ED at this time prior to discharge.  Plan: Home Medications- none; Home Treatments- stop EtOH; return here if the recommended treatment, does not improve the symptoms; Recommended follow up- PCP prn  Mancel Bale, MD 09/01/14 1349

## 2014-09-01 NOTE — Progress Notes (Signed)
IV removed. Patient up to the bathroom. Steady ambulation.  Juanda BondJames Kristelle Cavallaro Registered Nurse,  Emergency Department

## 2014-09-01 NOTE — ED Notes (Signed)
Pt was brought in by EMS earlier for intoxication and left prior to seeing EDP.  Pt was walking in parking lot and told Security that he wanted help this time.

## 2014-09-01 NOTE — ED Provider Notes (Signed)
44 yo male with history of intoxication presenting to the ED after being found intoxicated trying to get onto a bus.  When I went to evaluate patient, he was not in his room.  I was unable to perform an face to face evaluation.  Nursing staff stated that he eloped.  They alerted hospital security and [employer] who are looking for patient.    Blake DivineJohn Myalee Stengel, MD 09/01/14 1136

## 2014-09-03 ENCOUNTER — Encounter (HOSPITAL_COMMUNITY): Payer: Self-pay

## 2014-09-03 ENCOUNTER — Emergency Department (HOSPITAL_COMMUNITY)
Admission: EM | Admit: 2014-09-03 | Discharge: 2014-09-03 | Disposition: A | Discharge status: Home / Self Care | Payer: Self-pay | Attending: Emergency Medicine | Admitting: Emergency Medicine

## 2014-09-03 DIAGNOSIS — F10920 Alcohol use, unspecified with intoxication, uncomplicated: Secondary | ICD-10-CM

## 2014-09-03 DIAGNOSIS — Z720 Tobacco use: Secondary | ICD-10-CM | POA: Insufficient documentation

## 2014-09-03 DIAGNOSIS — Z79899 Other long term (current) drug therapy: Secondary | ICD-10-CM | POA: Insufficient documentation

## 2014-09-03 DIAGNOSIS — F10120 Alcohol abuse with intoxication, uncomplicated: Secondary | ICD-10-CM | POA: Insufficient documentation

## 2014-09-03 NOTE — ED Notes (Signed)
[facility] KAREN R RN AND JASON U RN PRESENT SPEAKING WITH THIS PT. [employer] PRESENT.

## 2014-09-03 NOTE — ED Notes (Signed)
[facility] and [facility], pt requesting food upon arrival.

## 2014-09-03 NOTE — ED Notes (Signed)
Pt stated he was leaving since he couldn't have food.

## 2014-09-03 NOTE — ED Provider Notes (Signed)
CSN: [id number]     Arrival date & time 09/03/14  1651 History   First MD Initiated Contact with Patient 09/03/14 1711     Chief Complaint  Patient presents with  . Alcohol Intoxication     (Consider location/radiation/quality/duration/timing/severity/associated sxs/prior Treatment) HPI Patient presents with multiple police officers. [place] Officers report the patient was stumbling, had difficulty with ambulation, but was cooperative when they found him. The patient denies any physical pains, states that he was drinking a lot. He denies drinking with intent for self-harm. He denies other substance abuse. When asked if there was family available to come be with the patient, he states that they give up on him a long time ago.  Patient requests food.  Past Medical History  Diagnosis Date  . Alcohol abuse    Past Surgical History  Procedure Laterality Date  . No past surgeries     No family history on file. History  Substance Use Topics  . Smoking status: Current Every Day Smoker  . Smokeless tobacco: Not on file  . Alcohol Use: Yes     Comment: daily    Review of Systems  Unable to perform ROS: Other   Patient defers other questions, likely due to his intoxication.   Allergies  Review of patient's allergies indicates no known allergies.  Home Medications   Prior to Admission medications   Medication Sig Start Date End Date Taking? Authorizing Provider  FLUoxetine (PROZAC) 20 MG capsule Take 20 mg by mouth daily.    Historical Provider, MD  gabapentin (NEURONTIN) 300 MG capsule Take 300 mg by mouth 3 (three) times daily.    Historical Provider, MD  OLANZapine (ZYPREXA) 10 MG tablet Take 10 mg by mouth at bedtime.    Historical Provider, MD  ondansetron (ZOFRAN ODT) 4 MG disintegrating tablet Take 1 tablet (4 mg total) by mouth every 8 (eight) hours as needed for nausea or vomiting. Patient not taking: Reported on 08/30/2014  08/25/14   Teressa LowerVrinda Pickering, NP  traZODone (DESYREL) 100 MG tablet Take 100 mg by mouth at bedtime.    Historical Provider, MD   BP 132/90 mmHg  Pulse 120  Temp(Src) 98.3 F (36.8 C) (Oral)  Resp 16  SpO2 96% Physical Exam  Constitutional: He is oriented to person, place, and time. He appears well-developed. No distress.  Middle-age male resting in no distress, notable odor of alcohol.  HENT:  Head: Normocephalic and atraumatic.  Eyes: Conjunctivae and EOM are normal.  Pulmonary/Chest: Effort normal. No stridor. No respiratory distress.  Abdominal: He exhibits no distension.  Musculoskeletal: He exhibits no edema.  Neurological: He is alert and oriented to person, place, and time.  Patient was ultimately spontaneously, speech is clear, but slightly delayed. Patient has normal ambulation.   Skin: Skin is warm and dry.  Psychiatric:  Patient minimally participatory, aside from when directly addressed.  Nursing note and vitals reviewed.   ED Course  Procedures (including critical care time) I reviewed the patient's EMR, including multiple prior visits for acute intoxication.  I discussed the patient's case with the officers who accompanied him to our emergency department.  Soon after my initial evaluation the patient requested discharge, removed all of his monitoring equipment.  Demanded to leave.  Patient was discharged with police. MDM  Patient with multiple prior visits for alcohol intoxication presents of hurting found intoxicated in public. Patient was in no distress, with no complaints, though there was evidence for intoxication.  Patient was ambulatory, hemodynamically stable, and refused any evaluation after my initial evaluation, and initiation of plan. Patient discharged into the custody of police.    Gerhard Munch, MD 09/03/14 [phone]

## 2014-09-03 NOTE — ED Notes (Signed)
Pt presents by police with c/o alcohol intoxication. Pt reports his last drink was this morning. Pt is stumbling in lobby, slurring his words. Pt seen multiple times for the same recently. Pt denies SI/HI.

## 2014-09-04 ENCOUNTER — Encounter (HOSPITAL_COMMUNITY): Payer: Self-pay | Admitting: Emergency Medicine

## 2014-09-04 ENCOUNTER — Emergency Department (HOSPITAL_COMMUNITY)
Admission: EM | Admit: 2014-09-04 | Discharge: 2014-09-04 | Discharge status: Against Medical Advice | Payer: Self-pay | Attending: Emergency Medicine | Admitting: Emergency Medicine

## 2014-09-04 DIAGNOSIS — F10129 Alcohol abuse with intoxication, unspecified: Secondary | ICD-10-CM | POA: Insufficient documentation

## 2014-09-04 DIAGNOSIS — F911 Conduct disorder, childhood-onset type: Secondary | ICD-10-CM | POA: Insufficient documentation

## 2014-09-04 DIAGNOSIS — Z720 Tobacco use: Secondary | ICD-10-CM | POA: Insufficient documentation

## 2014-09-04 NOTE — ED Notes (Signed)
Pt observed  in lobby, per registration

## 2014-09-04 NOTE — ED Notes (Signed)
Pt not in lobby, called x 3. By triage RN

## 2014-09-04 NOTE — ED Notes (Signed)
[facility] EMS [facility] and [facility] out with head down on a table.  Pt became aggressive with EMS throwing urinal at them and balling up his fist at them.

## 2014-09-05 ENCOUNTER — Emergency Department (HOSPITAL_COMMUNITY)
Admission: EM | Admit: 2014-09-05 | Discharge: 2014-09-06 | Disposition: A | Discharge status: Home / Self Care | Payer: Self-pay | Attending: Emergency Medicine | Admitting: Emergency Medicine

## 2014-09-05 ENCOUNTER — Emergency Department (HOSPITAL_COMMUNITY)
Admission: EM | Admit: 2014-09-05 | Discharge: 2014-09-05 | Disposition: A | Discharge status: Home / Self Care | Payer: Self-pay | Attending: Emergency Medicine | Admitting: Emergency Medicine

## 2014-09-05 ENCOUNTER — Encounter (HOSPITAL_COMMUNITY): Payer: Self-pay | Admitting: Emergency Medicine

## 2014-09-05 DIAGNOSIS — F1010 Alcohol abuse, uncomplicated: Secondary | ICD-10-CM

## 2014-09-05 DIAGNOSIS — F10920 Alcohol use, unspecified with intoxication, uncomplicated: Secondary | ICD-10-CM

## 2014-09-05 DIAGNOSIS — Z79899 Other long term (current) drug therapy: Secondary | ICD-10-CM | POA: Insufficient documentation

## 2014-09-05 DIAGNOSIS — Z720 Tobacco use: Secondary | ICD-10-CM | POA: Insufficient documentation

## 2014-09-05 DIAGNOSIS — F10120 Alcohol abuse with intoxication, uncomplicated: Secondary | ICD-10-CM | POA: Insufficient documentation

## 2014-09-05 LAB — CBC WITH DIFFERENTIAL/PLATELET
BASOS ABS: 0 10*3/uL (ref 0.0–0.1)
Basophils Relative: 1 % (ref 0–1)
Eosinophils Absolute: 0.1 10*3/uL (ref 0.0–0.7)
Eosinophils Relative: 1 % (ref 0–5)
HEMATOCRIT: 46.1 % (ref 39.0–52.0)
Hemoglobin: 15.5 g/dL (ref 13.0–17.0)
LYMPHS ABS: 2.1 10*3/uL (ref 0.7–4.0)
Lymphocytes Relative: 39 % (ref 12–46)
MCHC: 31.5 g/dL (ref 30.0–36.0)
MCV: 93.7 fL (ref 78.0–100.0)
MONO ABS: 0.4 10*3/uL (ref 0.1–1.0)
Monocytes Relative: 7 % (ref 3–12)
NEUTROS ABS: 2.8 10*3/uL (ref 1.7–7.7)
Neutrophils Relative %: 52 % (ref 43–77)
PLATELETS: 103 10*3/uL — AB (ref 150–400)
RBC: 4.92 MIL/uL (ref 4.22–5.81)
RDW: 33.6 % — AB (ref 11.5–15.5)
WBC: 5.4 10*3/uL (ref 4.0–10.5)

## 2014-09-05 LAB — COMPREHENSIVE METABOLIC PANEL
ALK PHOS: 83 U/L (ref 39–117)
ALT: 27 U/L (ref 0–53)
ANION GAP: 14 (ref 5–15)
AST: 62 U/L — AB (ref 0–37)
Albumin: 4.5 g/dL (ref 3.5–5.2)
BILIRUBIN TOTAL: 0.5 mg/dL (ref 0.3–1.2)
BUN: 11 mg/dL (ref 6–23)
CO2: 27 mmol/L (ref 19–32)
Calcium: 8.5 mg/dL (ref 8.4–10.5)
Chloride: 102 mmol/L (ref 96–112)
Creatinine, Ser: 0.63 mg/dL (ref 0.50–1.35)
GFR calc non Af Amer: >90 mL/min (ref >90)
GLUCOSE: 96 mg/dL (ref 70–99)
POTASSIUM: 3.6 mmol/L (ref 3.5–5.1)
SODIUM: 143 mmol/L (ref 135–145)
Total Protein: 7.6 g/dL (ref 6.0–8.3)

## 2014-09-05 LAB — ETHANOL: Alcohol, Ethyl (B): 398 mg/dL — ABNORMAL HIGH (ref 0–9)

## 2014-09-05 NOTE — ED Notes (Signed)
Pt resting quietly at this time with even, unlabored resp

## 2014-09-05 NOTE — ED Notes (Signed)
Urine Drug screen was clicked off by accident, it isnt done yet, requisition is by the printer

## 2014-09-05 NOTE — ED Notes (Signed)
Pt walked w/o assistance with steady gait. Pt is A&O and in NAD

## 2014-09-05 NOTE — ED Notes (Signed)
[facility] Expected date:  Expected time:  Means of arrival:  Comments: EMS 44yo M, found in parking lot, ETOH

## 2014-09-05 NOTE — ED Notes (Signed)
Pt refused vital signs at d/c and states that he just wants to leave. Pt provided with coffee and left ED with steady gait. Pt in NAD

## 2014-09-05 NOTE — ED Notes (Signed)
Pt here via [facility]. Per EMS  a [employer] bus driver saw patient sleeping in a yard. Pt reports drinking listerine but unable to say how much. Pt seen yesterday as well. When this RN asked patient why he is here is states  "I don't know". Pt denies pain, SI,or HI. He is answering questions appropriately.

## 2014-09-05 NOTE — ED Notes (Signed)
Tech to walk pt to see if he has steady gait

## 2014-09-05 NOTE — ED Notes (Signed)
[facility] 128/88, 16 RR, 80 HR CBG: 79 per EMS.

## 2014-09-05 NOTE — Discharge Instructions (Signed)
Alcohol Intoxication °Alcohol intoxication occurs when you drink enough alcohol that it affects your ability to function. It can be mild or very severe. Drinking a lot of alcohol in a short time is called binge drinking. This can be very harmful. Drinking alcohol can also be more dangerous if you are taking medicines or other drugs. Some of the effects caused by alcohol may include: °· Loss of coordination. °· Changes in mood and behavior. °· Unclear thinking. °· Trouble talking (slurred speech). °· Throwing up (vomiting). °· Confusion. °· Slowed breathing. °· Twitching and shaking (seizures). °· Loss of consciousness. °HOME CARE °· Do not drive after drinking alcohol. °· Drink enough water and fluids to keep your pee (urine) clear or pale yellow. Avoid caffeine. °· Only take medicine as told by your doctor. °GET HELP IF: °· You throw up (vomit) many times. °· You do not feel better after a few days. °· You frequently have alcohol intoxication. Your doctor can help decide if you should see a substance use treatment counselor. °GET HELP RIGHT AWAY IF: °· You become shaky when you stop drinking. °· You have twitching and shaking. °· You throw up blood. It may look bright red or like coffee grounds. °· You notice blood in your poop (bowel movements). °· You become lightheaded or pass out (faint). °MAKE SURE YOU:  °· Understand these instructions. °· Will watch your condition. °· Will get help right away if you are not doing well or get worse. °Document Released: 11/29/2007 Document Revised: 02/12/2013 Document Reviewed: 11/15/2012 °ExitCare® Patient Information ©2015 [employer]. This information is not intended to replace advice given to you by your health care provider. Make sure you discuss any questions you have with your health care provider. ° °

## 2014-09-05 NOTE — ED Provider Notes (Signed)
CSN: [id number]     Arrival date & time 09/05/14  4098 History   First MD Initiated Contact with Patient 09/05/14 0745     Chief Complaint  Patient presents with  . Alcohol Intoxication     (Consider location/radiation/quality/duration/timing/severity/associated sxs/prior Treatment) Patient is a 44 y.o. male presenting with intoxication. The history is provided by the patient. No language interpreter was used.  Alcohol Intoxication This is a new problem. The problem occurs constantly. The problem has been gradually worsening. Pertinent negatives include no abdominal pain or coughing. Nothing aggravates the symptoms. He has tried nothing for the symptoms. The treatment provided moderate relief.  Pt reports he was drinking last pm.  Pt was brought in after falling asleep in someones yard. Pt denies any complaints Past Medical History  Diagnosis Date  . Alcohol abuse    Past Surgical History  Procedure Laterality Date  . No past surgeries     No family history on file. History  Substance Use Topics  . Smoking status: Current Every Day Smoker  . Smokeless tobacco: Not on file  . Alcohol Use: Yes     Comment: daily    Review of Systems  Respiratory: Negative for cough.   Gastrointestinal: Negative for abdominal pain.  All other systems reviewed and are negative.     Allergies  Review of patient's allergies indicates no known allergies.  Home Medications   Prior to Admission medications   Medication Sig Start Date End Date Taking? Authorizing Provider  FLUoxetine (PROZAC) 20 MG capsule Take 20 mg by mouth daily.   Yes Historical Provider, MD  gabapentin (NEURONTIN) 300 MG capsule Take 300 mg by mouth 3 (three) times daily.   Yes Historical Provider, MD  OLANZapine (ZYPREXA) 10 MG tablet Take 10 mg by mouth at bedtime.   Yes Historical Provider, MD  traZODone (DESYREL) 100 MG tablet Take 100 mg by mouth at bedtime.   Yes Historical Provider, MD  ondansetron (ZOFRAN ODT) 4  MG disintegrating tablet Take 1 tablet (4 mg total) by mouth every 8 (eight) hours as needed for nausea or vomiting. Patient not taking: Reported on 08/30/2014 08/25/14   Teressa Lower, NP   BP 121/95 mmHg  Pulse 73  Temp(Src) 97.7 F (36.5 C) (Oral)  Resp 20  SpO2 96% Physical Exam  Constitutional: He is oriented to person, place, and time. He appears well-developed and well-nourished.  HENT:  Head: Normocephalic and atraumatic.  Eyes: Conjunctivae and EOM are normal. Pupils are equal, round, and reactive to light.  Neck: Normal range of motion.  Cardiovascular: Normal rate and regular rhythm.   Pulmonary/Chest: Effort normal and breath sounds normal.  Abdominal: Soft. He exhibits no distension.  Musculoskeletal: Normal range of motion.  Neurological: He is alert and oriented to person, place, and time.  Skin: Skin is warm.  Psychiatric: He has a normal mood and affect.  Nursing note and vitals reviewed.   ED Course  Procedures (including critical care time) Labs Review Labs Reviewed  ETHANOL - Abnormal; Notable for the following:    Alcohol, Ethyl (B) 398 (*)    All other components within normal limits  CBC WITH DIFFERENTIAL/PLATELET - Abnormal; Notable for the following:    RDW 33.6 (*)    Platelets 103 (*)    All other components within normal limits  COMPREHENSIVE METABOLIC PANEL - Abnormal; Notable for the following:    AST 62 (*)    All other components within normal limits  URINE RAPID DRUG SCREEN (  HOSP PERFORMED)    Imaging Review No results found.   EKG Interpretation None      MDM etoh is 398.   Pt is sleeping.  Pt has a history of alcohol abuse.     Final diagnoses:  Alcohol abuse  Alcohol intoxication, uncomplicated       Lonia SkinnerLeslie K BajaderoSofia, PA-C 09/05/14 1120  Elwin MochaBlair Walden, MD 09/05/14 [phone]

## 2014-09-05 NOTE — ED Notes (Signed)
Bed: ZO10WA05 Expected date:  Expected time:  Means of arrival:  Comments: Etoh, "general sickness"

## 2014-09-06 ENCOUNTER — Encounter (HOSPITAL_COMMUNITY): Payer: Self-pay

## 2014-09-06 ENCOUNTER — Emergency Department (HOSPITAL_COMMUNITY)
Admission: EM | Admit: 2014-09-06 | Discharge: 2014-09-06 | Disposition: A | Discharge status: Home / Self Care | Payer: Self-pay | Attending: Dermatology | Admitting: Dermatology

## 2014-09-06 DIAGNOSIS — Z79899 Other long term (current) drug therapy: Secondary | ICD-10-CM | POA: Insufficient documentation

## 2014-09-06 DIAGNOSIS — F1010 Alcohol abuse, uncomplicated: Secondary | ICD-10-CM

## 2014-09-06 DIAGNOSIS — F10229 Alcohol dependence with intoxication, unspecified: Secondary | ICD-10-CM | POA: Insufficient documentation

## 2014-09-06 DIAGNOSIS — F10920 Alcohol use, unspecified with intoxication, uncomplicated: Secondary | ICD-10-CM

## 2014-09-06 DIAGNOSIS — Z720 Tobacco use: Secondary | ICD-10-CM | POA: Insufficient documentation

## 2014-09-06 LAB — ETHANOL: ALCOHOL ETHYL (B): 424 mg/dL — AB (ref 0–9)

## 2014-09-06 LAB — CBG MONITORING, ED: Glucose-Capillary: 130 mg/dL — ABNORMAL HIGH (ref 70–99)

## 2014-09-06 NOTE — ED Notes (Signed)
Pt aware we need to keep O2 on him because sats are low and is not compliant.

## 2014-09-06 NOTE — ED Provider Notes (Signed)
CSN: [id number]     Arrival date & time 09/05/14  2215 History   First MD Initiated Contact with Patient 09/05/14 2258     Chief Complaint  Patient presents with  . Alcohol Intoxication   Level V caveat for alcohol intoxication.  (Consider location/radiation/quality/duration/timing/severity/associated sxs/prior Treatment) HPI  [facility] was [facility] after being told he needed to leave. Please have given him a ticket for trespassing. They then brought into the emergency department for appearing to be intoxicated. Patient states he drinks "because I like it". He states he's depressed but is not suicidal or homicidal. Patient has been brought to the emergency department almost every day this month for being intoxicated. Patient was just seen in the emergency department 12 hours ago for being intoxicated. Patient states he has a home where he lives. He was asked why he did not drink at home.    [facility]  Past Medical History  Diagnosis Date  . Alcohol abuse    Past Surgical History  Procedure Laterality Date  . No past surgeries     No family history on file. History  Substance Use Topics  . Smoking status: Current Every Day Smoker  . Smokeless tobacco: Not on file  . Alcohol Use: Yes     Comment: daily  pt states he lives in a house Pt states he works in [employer]  Review of Systems  All other systems reviewed and are negative.     Allergies  Review of patient's allergies indicates no known allergies.  Home Medications   Prior to Admission medications   Medication Sig Start Date End Date Taking? Authorizing Provider  FLUoxetine (PROZAC) 20 MG capsule Take 20 mg by mouth daily.   Yes Historical Provider, MD  gabapentin (NEURONTIN) 300 MG capsule Take 300 mg by mouth 3 (three) times daily.   Yes Historical Provider, MD  OLANZapine (ZYPREXA) 10 MG tablet Take 10 mg by mouth at bedtime.   Yes Historical Provider, MD   traZODone (DESYREL) 100 MG tablet Take 100 mg by mouth at bedtime.   Yes Historical Provider, MD  ondansetron (ZOFRAN ODT) 4 MG disintegrating tablet Take 1 tablet (4 mg total) by mouth every 8 (eight) hours as needed for nausea or vomiting. Patient not taking: Reported on 08/30/2014 08/25/14   Teressa Lower, NP   ED Triage Vitals  Enc Vitals Group     BP 09/05/14 2224 116/67 mmHg     Pulse Rate 09/05/14 2224 77     Resp --      Temp 09/05/14 2224 97.9 F (36.6 C)     Temp Source 09/05/14 2224 Oral     SpO2 09/05/14 2224 88 %     Weight --      Height --      Head Cir --      Peak Flow --      Pain Score --      Pain Loc --      Pain Edu? --      Excl. in GC? --      Vital signs normal   Physical Exam  Constitutional: He is oriented to person, place, and time. He appears well-developed and well-nourished.  Non-toxic appearance. He does not appear ill. No distress.  Sleeping, hard to arouse.  HENT:  Head: Normocephalic and atraumatic.  Right Ear: External ear normal.  Left Ear: External ear normal.  Nose: Nose normal. No mucosal edema or rhinorrhea.  Mouth/Throat: Oropharynx is clear and moist and mucous membranes are normal. No dental abscesses or uvula swelling.  Eyes: Conjunctivae and EOM are normal. Pupils are equal, round, and reactive to light.  Neck: Normal range of motion and full passive range of motion without pain. Neck supple.  Cardiovascular: Normal rate, regular rhythm and normal heart sounds.  Exam reveals no gallop and no friction rub.   No murmur heard. Pulmonary/Chest: Effort normal and breath sounds normal. No respiratory distress. He has no wheezes. He has no rhonchi. He has no rales. He exhibits no tenderness and no crepitus.  Abdominal: Soft. Normal appearance and bowel sounds are normal. He exhibits no distension. There is no tenderness. There is no rebound and no guarding.  Musculoskeletal: Normal range of motion. He exhibits no edema or tenderness.   Moves all extremities well.   Neurological: He is alert and oriented to person, place, and time. He has normal strength. No cranial nerve deficit.  Speech is slurred  Skin: Skin is warm, dry and intact. No rash noted. No erythema. No pallor.  Psychiatric: His speech is slurred. He is slowed.  Nursing note and vitals reviewed.   ED Course  Procedures (including critical care time)  5 AM patient has been sleeping during his ED visit. He was noted to have his head at the foot of the bed. When I waking him up he states he has a headache and abdominal pain. However he goes back to sleep.  6 AM patient now has his head at the appropriate in the stretcher. He again is sleeping.  7:30 AM patient was awakened. He states he is "too sick" to be discharged. When asked how he is sick he states I been drinking". Patient has been in the ED for 8 hours. He is appropriate to be discharged this point.  Labs Review Labs Reviewed - No data to display  Imaging Review No results found.   EKG Interpretation None      MDM   Final diagnoses:  Alcohol abuse    Plan discharge  Devoria AlbeIva Tommi Crepeau, MD, Concha PyoFACEP     Shuntae Herzig, MD 09/06/14 [phone]

## 2014-09-06 NOTE — ED Provider Notes (Signed)
17:04 called by nursing staff from TCU 5 minutes ago stating that the patient wants to leave and walk out now. He doesn't want to wait for any discharge instructions. They report he is up and walking about in the hall and trying to walk out. Criteria for discharge per Dr. Denton LankSteinl was for the patient to be discharged once ambulatory and oriented. I have personally walked the discharge paper composed by Dr. Leo RodSteinel over to the TCU so the patient could have the resources provided to him for his problems with ongoing chronic alcoholism, homelessness and daily return to the emergency Department for intoxication. The patient took paperwork. He was standing in the hallway alert and coordinated gait  impatiently awaiting discharge.  Arby BarretteMarcy Curren Mohrmann, MD 09/06/14 [phone]

## 2014-09-06 NOTE — Discharge Instructions (Signed)
Do not drink alcohol.  Follow up with primary care doctor in coming week.  Follow up with AA, and use resource guide provided for additional community resources.  For mental health issues and/or crisis, go directly to [facility].  Return to ER if worse, new symptoms, fevers, medical emergency, other concern.      Alcohol Use Disorder Alcohol use disorder is a mental disorder. It is not a one-time incident of heavy drinking. Alcohol use disorder is the excessive and uncontrollable use of alcohol over time that leads to problems with functioning in one or more areas of daily living. People with this disorder risk harming themselves and others when they drink to excess. Alcohol use disorder also can cause other mental disorders, such as mood and anxiety disorders, and serious physical problems. People with alcohol use disorder often misuse other drugs.  Alcohol use disorder is common and widespread. Some people with this disorder drink alcohol to cope with or escape from negative life events. Others drink to relieve chronic pain or symptoms of mental illness. People with a family history of alcohol use disorder are at higher risk of losing control and using alcohol to excess.  SYMPTOMS  Signs and symptoms of alcohol use disorder may include the following:   Consumption ofalcohol inlarger amounts or over a longer period of time than intended.  Multiple unsuccessful attempts to cutdown or control alcohol use.   A great deal of time spent obtaining alcohol, using alcohol, or recovering from the effects of alcohol (hangover).  A strong desire or urge to use alcohol (cravings).   Continued use of alcohol despite problems at work, school, or home because of alcohol use.   Continued use of alcohol despite problems in relationships because of alcohol use.  Continued use of alcohol in situations when it is physically hazardous, such as driving a car.  Continued use of alcohol despite  awareness of a physical or psychological problem that is likely related to alcohol use. Physical problems related to alcohol use can involve the brain, heart, liver, stomach, and intestines. Psychological problems related to alcohol use include intoxication, depression, anxiety, psychosis, delirium, and dementia.   The need for increased amounts of alcohol to achieve the same desired effect, or a decreased effect from the consumption of the same amount of alcohol (tolerance).  Withdrawal symptoms upon reducing or stopping alcohol use, or alcohol use to reduce or avoid withdrawal symptoms. Withdrawal symptoms include:  Racing heart.  Hand tremor.  Difficulty sleeping.  Nausea.  Vomiting.  Hallucinations.  Restlessness.  Seizures. DIAGNOSIS Alcohol use disorder is diagnosed through an assessment by your health care provider. Your health care provider may start by asking three or four questions to screen for excessive or problematic alcohol use. To confirm a diagnosis of alcohol use disorder, at least two symptoms must be present within a 72-month period. The severity of alcohol use disorder depends on the number of symptoms:  Mild--two or three.  Moderate--four or five.  Severe--six or more. Your health care provider may perform a physical exam or use results from lab tests to see if you have physical problems resulting from alcohol use. Your health care provider may refer you to a mental health professional for evaluation. TREATMENT  Some people with alcohol use disorder are able to reduce their alcohol use to low-risk levels. Some people with alcohol use disorder need to quit drinking alcohol. When necessary, mental health professionals with specialized training in substance use treatment can help. Your health care  provider can help you decide how severe your alcohol use disorder is and what type of treatment you need. The following forms of treatment are available:    Detoxification. Detoxification involves the use of prescription medicines to prevent alcohol withdrawal symptoms in the first week after quitting. This is important for people with a history of symptoms of withdrawal and for heavy drinkers who are likely to have withdrawal symptoms. Alcohol withdrawal can be dangerous and, in severe cases, cause death. Detoxification is usually provided in a hospital or in-patient substance use treatment facility.  Counseling or talk therapy. Talk therapy is provided by substance use treatment counselors. It addresses the reasons people use alcohol and ways to keep them from drinking again. The goals of talk therapy are to help people with alcohol use disorder find healthy activities and ways to cope with life stress, to identify and avoid triggers for alcohol use, and to handle cravings, which can cause relapse.  Medicines.Different medicines can help treat alcohol use disorder through the following actions:  Decrease alcohol cravings.  Decrease the positive reward response felt from alcohol use.  Produce an uncomfortable physical reaction when alcohol is used (aversion therapy).  Support groups. Support groups are run by people who have quit drinking. They provide emotional support, advice, and guidance. These forms of treatment are often combined. Some people with alcohol use disorder benefit from intensive combination treatment provided by specialized substance use treatment centers. Both inpatient and outpatient treatment programs are available. Document Released: 07/20/2004 Document Revised: 10/27/2013 Document Reviewed: 09/19/2012 [facility] Patient Information 2015 [place], [place]. This information is not intended to replace advice given to you by your health care provider. Make sure you discuss any questions you have with your health care provider.     Alcohol Intoxication Alcohol intoxication occurs when the amount of alcohol that a person has  consumed impairs his or her ability to mentally and physically function. Alcohol directly impairs the normal chemical activity of the brain. Drinking large amounts of alcohol can lead to changes in mental function and behavior, and it can cause many physical effects that can be harmful.  Alcohol intoxication can range in severity from mild to very severe. Various factors can affect the level of intoxication that occurs, such as the person's age, gender, weight, frequency of alcohol consumption, and the presence of other medical conditions (such as diabetes, seizures, or heart conditions). Dangerous levels of alcohol intoxication may occur when people drink large amounts of alcohol in a short period (binge drinking). Alcohol can also be especially dangerous when combined with certain prescription medicines or "recreational" drugs. SIGNS AND SYMPTOMS Some common signs and symptoms of mild alcohol intoxication include:  Loss of coordination.  Changes in mood and behavior.  Impaired judgment.  Slurred speech. As alcohol intoxication progresses to more severe levels, other signs and symptoms will appear. These may include:  Vomiting.  Confusion and impaired memory.  Slowed breathing.  Seizures.  Loss of consciousness. DIAGNOSIS  Your health care provider will take a medical history and perform a physical exam. You will be asked about the amount and type of alcohol you have consumed. Blood tests will be done to measure the concentration of alcohol in your blood. In many places, your blood alcohol level must be lower than 80 mg/dL (1.61%) to legally drive. However, many dangerous effects of alcohol can occur at much lower levels.  TREATMENT  People with alcohol intoxication often do not require treatment. Most of the effects of alcohol intoxication  are temporary, and they go away as the alcohol naturally leaves the body. Your health care provider will monitor your condition until you are stable  enough to go home. Fluids are sometimes given through an IV access tube to help prevent dehydration.  HOME CARE INSTRUCTIONS  Do not drive after drinking alcohol.  Stay hydrated. Drink enough water and fluids to keep your urine clear or pale yellow. Avoid caffeine.   Only take over-the-counter or prescription medicines as directed by your health care provider.  SEEK MEDICAL CARE IF:   You have persistent vomiting.   You do not feel better after a few days.  You have frequent alcohol intoxication. Your health care provider can help determine if you should see a substance use treatment counselor. SEEK IMMEDIATE MEDICAL CARE IF:   You become shaky or tremble when you try to stop drinking.   You shake uncontrollably (seizure).   You throw up (vomit) blood. This may be bright red or may look like black coffee grounds.   You have blood in your stool. This may be bright red or may appear as a black, tarry, bad smelling stool.   You become lightheaded or faint.  MAKE SURE YOU:   Understand these instructions.  Will watch your condition.  Will get help right away if you are not doing well or get worse. Document Released: 03/22/2005 Document Revised: 02/12/2013 Document Reviewed: 11/15/2012 [facility] Patient Information 2015 [place], [place]. This information is not intended to replace advice given to you by your health care provider. Make sure you discuss any questions you have with your health care provider.      Emergency Department Resource Guide 1) Find a Doctor and Pay Out of Pocket Although you won't have to find out who is covered by your insurance plan, it is a good idea to ask around and get recommendations. You will then need to call the office and see if the doctor you have chosen will accept you as a new patient and what types of options they offer for patients who are self-pay. Some doctors offer discounts or will set up payment plans for their patients who do not  have insurance, but you will need to ask so you aren't surprised when you get to your appointment.  2) Contact Your Local Health Department Not all health departments have doctors that can see patients for sick visits, but many do, so it is worth a call to see if yours does. If you don't know where your local health department is, you can check in your phone book. The CDC also has a tool to help you locate your state's health department, and many state websites also have listings of all of their local health departments.  3) Find a Walk-in Clinic If your illness is not likely to be very severe or complicated, you may want to try a walk in clinic. These are popping up all over the country in pharmacies, drugstores, and shopping centers. They're usually staffed by nurse practitioners or physician assistants that have been trained to treat common illnesses and complaints. They're usually fairly quick and inexpensive. However, if you have serious medical issues or chronic medical problems, these are probably not your best option.  No Primary Care Doctor: - Call Health Connect at  [phone] - they can help you locate a primary care doctor that  accepts your insurance, provides certain services, etc. - Physician Referral Service- [phone]  Chronic Pain Problems: Organization  Address  Phone   Notes  [facility]  [phone]) [phone] Patients need to be referred by their primary care doctor.   Medication Assistance: Organization         Address  Phone   Notes  [facility] Medication [facility] [address] [place], [place] [address] [phone]) [phone] --Must be a resident of [facility] [place] -- Must have NO insurance coverage whatsoever (no Medicaid/ Medicare, etc.) -- The pt. MUST have a primary care doctor that directs their care regularly and follows them in the community   [facility]ist  [phone]   [employer]  [phone]    Agencies that  provide inexpensive medical care: Organization         Address  Phone   Notes  [facility]  [phone]   [facility]    [phone]) [phone]   [facility] [address] [place], [place] [address] [phone]   [facility] [place] [place] [address], [place] [phone]   [facility]    [phone](336) [phone]   [facility]    [phone]) [phone]   [facility]  [address], [place] Phone:  [phone](336) [phone], Fax:  [phone] Hours of Operation:  9 am - 6 pm, M-F.  Also accepts Medicaid/Medicare and self-pay.  [facility]  [address], [place] Phone: [phone], Fax: [phone]. Hours of Operation:  8:30 am - 5:30 pm, M-F.  Also accepts Medicaid and self-pay.  [facility] [place] [address], [place] Phone: [phone]   [facility] [address], [place], [place] [phone], Ext. [phone] Mondays & Thursdays: 7-9 AM.  First 15 patients are seen on a first come, first serve basis.    Medicaid-accepting [facility] Providers:  Organization         Address  Phone   Notes  [facility] [address], [place] [phone]) [phone] Also accepts self-pay patients.  [facility] [address], [place]  [phone]) [phone]   [facility] [address], [place] [phone]) [phone]   [facility] [address] [place] [address], [place] [phone]) [phone]   Renaye RakersVeita Bland [address], [place]   [phone]) [phone] Only accepts [place] Access [place] patients after they have their name applied to their card.   Self-Pay (no insurance) in [facility] [place]:  Organization         Address  Phone   Notes  Sickle Cell Patients, [facility] [address], [place] [phone]) [phone]   [facility] [address], [place] [phone]) [phone]   [facility] [place]  [address], [place] [phone]) [phone]   [facility]/Dr. Osei-Bonsu  [address] [place] [address], [place] or [id number], [place] [phone] Phone number for both [place] and [place] locations is the same.  [facility] [address], [place] [phone]) [phone]   [facility] [address] [place] [address] [place] [address], [place] or [address] [phone]) [phone]   [facility] [address], [place] [phone], phone; [phone], fax Sees patients 1st and 3rd Saturday of every month.  Must not qualify  for public or private insurance (i.e. Medicaid, Medicare, [place] Health Choice, Veterans' Benefits)  Household income should be no more than 200% of the poverty level The clinic cannot treat you if you are pregnant or think you are pregnant  Sexually transmitted diseases are not treated at the clinic.    Dental Care: Organization         Address  Phone  Notes  [facility] of [facility] [address], [place] [phone] Accepts children up to age 46 who are enrolled in [place]icaid or [place] Health Choice; pregnant women with a Medicaid card; and children who have applied for Medicaid or [place] Health Choice, but were declined, whose parents can pay a reduced fee at time of service.  [facility] Department of [facility] [place]  [address], [place] [phone]) [phone] Accepts children up to age 46 who are enrolled in [place] or [place] Health Choice; pregnant women with a Medicaid card; and children who have applied for Medicaid or [place] Health Choice, but were declined, whose parents can pay a reduced fee at time of service.  [facility]  [address], [place] [phone]) [phone] Patients are seen by appointment only. Walk-ins  are not accepted. [facility] will see patients 46 years of age and older. Monday - Tuesday (8am-5pm) Most Wednesdays (8:30-5pm) $30 per visit, cash only  [facility]  [address], [place] [facility] [phone]) [phone] Patients are seen by appointment only. Walk-ins are not accepted. [facility] will see patients 46 years of age and older. One Wednesday Evening (Monthly: Volunteer Based).  $30 per visit, cash only  [employer] [facility]  [phone] for adults; Children under age 104, call Graduate Pediatric Dentistry at [phone]. Children aged 574-14, please call [phone] to request a pediatric application.  Dental services are provided in all areas of dental care including fillings, crowns and bridges, complete and partial dentures, implants, gum treatment, root canals, and extractions. Preventive care is also provided. Treatment is provided to both adults and children. Patients are selected via a lottery and there is often a waiting list.   [facility] [address], [place]  [phone] [email or web address]   [facility] [address], [place], [place] [phone], Ext. [phone] Second and Fourth Thursday of each month, opens at 6:30 AM; Clinic ends at 9 AM.  Patients are seen on a first-come first-served basis, and a limited number are seen during each clinic.   [facility]  [address], [place], [place] [phone]) [phone]   Eligibility Requirements You must have lived in [place], or [place] for at least the last three months.   You cannot be eligible for state or federal sponsored National Cityhealthcare insurance, including CIGNAVeterans Administration, [place]icaid, or [employer]Medicare.   You generally cannot be eligible for healthcare insurance through your employer.    How to apply: Eligibility screenings are held every Tuesday and Wednesday afternoon from 1:00 pm until 4:00 pm. You do not need an appointment  for the interview!  [facility] [address], [place], [place] [phone]   [facility]  [phone]   [facility]  [phone]   [facility]  [phone]    Behavioral Health Resources in the Community: Intensive Outpatient Programs Organization         Address  Phone  Notes  [facility] [address] [place], [place] [phone]   [facility]patient [address] [place], [place] [phone]   [facility] [address] [place], [place]  [phone]   [facility] [address]  [place], [place] [phone] or [phone]   Substance Abuse Resources Organization         Address  Phone  Notes  Alcohol and Drug Services  [phone]   [facility]  [phone]   [facility] [place]  [phone]   Floydene Flock  [phone]   Residential & Outpatient Substance Abuse Program  [phone]   Psychological Services Organization         Address  Phone  Notes  [facility]  [phone]   [facility] Services  [phone]   [facility] [address] [place] [phone] or [phone]    Mobile Crisis Teams Organization         Address  Phone  Notes  [facility], Mobile Crisis Care Unit  [phone]   [facility]  [address] [place], [place] [phone]   Doristine Locks [address] [place] [phone]    Self-Help/Support Groups Organization         Address  Phone             Notes  [facility] [place] - variety of support groups  [phone]- [phone] Call for more information  Narcotics Anonymous (NA), [facility] [address], [employer] [place]  2 meetings at this location   Statistician         Address  Phone  Notes  [facility]  [facility] [address],    [place]  [phone]   [facility]  [address], [place] [address], [place], [place] [phone]   [facility] [address] [place], [place] [phone] Admissions: 8am-3pm M-F  [facility] [address],    [place], [place] [phone]   [facility] [address] [place], [place], [place] [phone]   [facility] [address]  [place], [place] [phone]   [facility] - Intensive Outpatient [address], [place], [place] [phone]   [facility] ([facility] [address]  [place], [place] [phone] or [phone]   [facility] ([facility]) [address], [place], [place] [phone] Accepts Medicaid  [facility] [address]  [place] [phone] Substance Abuse/Addiction Treatment   [facility] Organization         Address  Phone  Notes  [facility]  [phone]   Angie Fava, PhD [address] [place], [place]   [phone] or [phone]   [facility] Behavioral   [address] [place], [place] [phone]   [facility] [address] [place], [place] [phone] Insurance/Medicaid/sponsorship through [facility] Families [address]                                    [place], [place] [phone] Therapy/tele-psych/case  [facility] [address], [place] [phone]    Dr. Lolly Mustache  [phone]   [facility] 1) [address], [place] °2) [address], [place] °3)  [address], [place] [phone] °[phone] ° °[phone]   °[place] Child Abuse Hotline [phone]) [phone] or [phone] (After Hours)    ° ° ° ° °

## 2014-09-06 NOTE — Discharge Instructions (Signed)
Considering discussing alcohol detox at [facility].

## 2014-09-06 NOTE — ED Notes (Signed)
PO fluids given

## 2014-09-06 NOTE — ED Provider Notes (Signed)
CSN: [id number]     Arrival date & time 09/06/14  1239 History   First MD Initiated Contact with Patient 09/06/14 1248     Chief Complaint  Patient presents with  . Intoxicated      (Consider location/radiation/quality/duration/timing/severity/associated sxs/prior Treatment) The history is provided by the patient and the EMS personnel. The history is limited by the condition of the patient.  pt with hx chronic etoh abuse, presents after consuming unspecified amount of listerine shortly prior to arrival today.  Pt was found outside local business, intoxicated, and brought to ED.  Pt currently denies wanting etoh rehab/detox.  Pt denies hx seizures or dts.  Denies trauma, fall or injury. No headache. No neck or back pain. Pt denies depression, or thoughts of harm to self or others.      Past Medical History  Diagnosis Date  . Alcohol abuse    Past Surgical History  Procedure Laterality Date  . No past surgeries     Family History  Problem Relation Age of Onset  . Family history unknown: Yes   History  Substance Use Topics  . Smoking status: Current Every Day Smoker  . Smokeless tobacco: Not on file  . Alcohol Use: Yes     Comment: daily    Review of Systems  Constitutional: Negative for fever.  HENT: Negative for sore throat.   Eyes: Negative for pain.  Respiratory: Negative for shortness of breath.   Cardiovascular: Negative for chest pain.  Gastrointestinal: Negative for vomiting, abdominal pain and diarrhea.  Genitourinary: Negative for flank pain.  Musculoskeletal: Negative for back pain and neck pain.  Skin: Negative for rash.  Neurological: Negative for headaches.  Hematological: Does not bruise/bleed easily.  Psychiatric/Behavioral: Negative for suicidal ideas.      Allergies  Review of patient's allergies indicates no known allergies.  Home Medications   Prior to Admission medications   Medication Sig Start Date End Date Taking? Authorizing Provider   FLUoxetine (PROZAC) 20 MG capsule Take 20 mg by mouth daily.   Yes Historical Provider, MD  gabapentin (NEURONTIN) 300 MG capsule Take 300 mg by mouth 3 (three) times daily.   Yes Historical Provider, MD  OLANZapine (ZYPREXA) 10 MG tablet Take 10 mg by mouth at bedtime.   Yes Historical Provider, MD  traZODone (DESYREL) 100 MG tablet Take 100 mg by mouth at bedtime.   Yes Historical Provider, MD  ondansetron (ZOFRAN ODT) 4 MG disintegrating tablet Take 1 tablet (4 mg total) by mouth every 8 (eight) hours as needed for nausea or vomiting. Patient not taking: Reported on 08/30/2014 08/25/14   Teressa Lower, NP   BP 136/90 mmHg  Pulse 91  Temp(Src) 97.6 F (36.4 C) (Oral)  Resp 18  SpO2 92% Physical Exam  Constitutional: He appears well-developed and well-nourished. No distress.  HENT:  Head: Atraumatic.  Mouth/Throat: Oropharynx is clear and moist.  Eyes: Conjunctivae are normal. Pupils are equal, round, and reactive to light. No scleral icterus.  Neck: Neck supple. No tracheal deviation present.  Cardiovascular: Normal rate, regular rhythm, normal heart sounds and intact distal pulses.   Pulmonary/Chest: Effort normal and breath sounds normal. No accessory muscle usage. No respiratory distress.  Abdominal: Soft. Bowel sounds are normal. He exhibits no distension. There is no tenderness.  Musculoskeletal: Normal range of motion. He exhibits no edema or tenderness.  CTLS spine, non tender, aligned, no step off.   Neurological: He is alert.  Alert, oriented. ?intoxicated. Moves bil extremities purposefully w good  strength.   Skin: Skin is warm and dry. He is not diaphoretic.  Psychiatric:  Intoxicated.   Nursing note and vitals reviewed.   ED Course  Procedures (including critical care time) Labs Review  Results for orders placed or performed during the hospital encounter of 09/06/14  Ethanol  Result Value Ref Range   Alcohol, Ethyl (B) 424 (HH) 0 - 9 mg/dL  POC CBG, ED   Result Value Ref Range   Glucose-Capillary 130 (H) 70 - 99 mg/dL       MDM   Labs.  Reviewed nursing notes and prior charts for additional history.   On review recent SW/[facility] notes - pt very recently declined by both [facility] and [facility] for etoh abuse, as he had been recently treated at those facilities and chosen not to maintain sobriety.  Encouraged pt to pursue etoh rehab options as outpatient - will provide resource guide.  Recheck pt, tolerating po fluids.  Ambulates w steady gait.   No tremor or shakes.  Hr 88, rr 16.    Signed out to oncoming provider, Dr Donnald GarrePfeiffer, recheck, and anticipate d/c when clinically more sober.      Cathren LaineKevin Bianco Cange, MD 09/06/14 [phone]

## 2014-09-06 NOTE — ED Notes (Signed)
Pt sts he is ready to leave, Dr Clarice PolePfeifer brought his d/c paperwork which he threw on the floor and walked away refusing vital signs.

## 2014-09-06 NOTE — ED Notes (Signed)
Per EMS- [facility] [employer] with a large bottle of Listerine that ws 3/4 empty.

## 2014-09-07 ENCOUNTER — Emergency Department (HOSPITAL_COMMUNITY)
Admission: EM | Admit: 2014-09-07 | Discharge: 2014-09-07 | Discharge status: Against Medical Advice | Payer: Self-pay | Attending: Emergency Medicine | Admitting: Emergency Medicine

## 2014-09-07 ENCOUNTER — Encounter (HOSPITAL_COMMUNITY): Payer: Self-pay | Admitting: Emergency Medicine

## 2014-09-07 DIAGNOSIS — IMO0002 Reserved for concepts with insufficient information to code with codable children: Secondary | ICD-10-CM

## 2014-09-07 DIAGNOSIS — Z720 Tobacco use: Secondary | ICD-10-CM | POA: Insufficient documentation

## 2014-09-07 DIAGNOSIS — Z79899 Other long term (current) drug therapy: Secondary | ICD-10-CM | POA: Insufficient documentation

## 2014-09-07 DIAGNOSIS — F10129 Alcohol abuse with intoxication, unspecified: Secondary | ICD-10-CM | POA: Insufficient documentation

## 2014-09-07 LAB — COMPREHENSIVE METABOLIC PANEL
ALT: 34 U/L (ref 0–53)
ANION GAP: 17 — AB (ref 5–15)
AST: 73 U/L — AB (ref 0–37)
Albumin: 4.9 g/dL (ref 3.5–5.2)
Alkaline Phosphatase: 94 U/L (ref 39–117)
BUN: 12 mg/dL (ref 6–23)
CALCIUM: 8.8 mg/dL (ref 8.4–10.5)
CO2: 26 mmol/L (ref 19–32)
CREATININE: 0.63 mg/dL (ref 0.50–1.35)
Chloride: 101 mmol/L (ref 96–112)
GFR calc Af Amer: >90 mL/min (ref >90)
Glucose, Bld: 109 mg/dL — ABNORMAL HIGH (ref 70–99)
Potassium: 3.7 mmol/L (ref 3.5–5.1)
Sodium: 144 mmol/L (ref 135–145)
Total Bilirubin: 0.5 mg/dL (ref 0.3–1.2)
Total Protein: 8.1 g/dL (ref 6.0–8.3)

## 2014-09-07 LAB — CBC WITH DIFFERENTIAL/PLATELET
BASOS ABS: 0 10*3/uL (ref 0.0–0.1)
BASOS PCT: 1 % (ref 0–1)
EOS ABS: 0 10*3/uL (ref 0.0–0.7)
Eosinophils Relative: 1 % (ref 0–5)
HEMATOCRIT: 47.6 % (ref 39.0–52.0)
Hemoglobin: 16.3 g/dL (ref 13.0–17.0)
Lymphocytes Relative: 43 % (ref 12–46)
Lymphs Abs: 2.8 10*3/uL (ref 0.7–4.0)
MCH: 31.7 pg (ref 26.0–34.0)
MCHC: 34.2 g/dL (ref 30.0–36.0)
MCV: 92.6 fL (ref 78.0–100.0)
MONOS PCT: 7 % (ref 3–12)
Monocytes Absolute: 0.5 10*3/uL (ref 0.1–1.0)
NEUTROS ABS: 3.2 10*3/uL (ref 1.7–7.7)
Neutrophils Relative %: 49 % (ref 43–77)
Platelets: 83 10*3/uL — ABNORMAL LOW (ref 150–400)
RBC: 5.14 MIL/uL (ref 4.22–5.81)
RDW: 13.5 % (ref 11.5–15.5)
WBC: 6.6 10*3/uL (ref 4.0–10.5)

## 2014-09-07 LAB — SALICYLATE LEVEL

## 2014-09-07 LAB — ETHANOL: ALCOHOL ETHYL (B): 449 mg/dL — AB (ref 0–9)

## 2014-09-07 LAB — ACETAMINOPHEN LEVEL

## 2014-09-07 MED ORDER — LORAZEPAM 1 MG PO TABS: 0.0000 mg | ORAL_TABLET | Freq: Two times a day (BID) | ORAL | Status: DC

## 2014-09-07 MED ORDER — LORAZEPAM 1 MG PO TABS: 0.0000 mg | ORAL_TABLET | Freq: Four times a day (QID) | ORAL | Status: DC

## 2014-09-07 MED ORDER — AMMONIA AROMATIC IN INHA
RESPIRATORY_TRACT | Status: AC
  Administered 2014-09-07: 18:00:00
  Filled 2014-09-07: qty 10

## 2014-09-07 NOTE — ED Notes (Signed)
Pt left AMA; pt refuses VS or to sign; pt walked out under own power; pt was stable walking

## 2014-09-07 NOTE — ED Notes (Signed)
Pt refusing to get up to provide urine sample, Pt states "He does not give a f**k  And will not do so".  Pt has been informed we need sample, states he has already done so, when he has not.

## 2014-09-07 NOTE — [facility] Counselor (Signed)
Attempted to see pt for an assessment. Pt was too intoxicated to be seen at this time. Not able to answer questions. RN was notified and will put the TTS consult back in when he is sober.   Kateri PlummerKristin Yoni Lobos, M.S., LPCA, BurginLCASA, [facility] Licensed Professional Counselor Associate  Triage Specialist  [facility]  Therapeutic Triage Services Phone: [phone] Fax: [phone]

## 2014-09-07 NOTE — ED Notes (Signed)
Pt refused to getting vitals.

## 2014-09-07 NOTE — ED Provider Notes (Signed)
CSN: [id number]     Arrival date & time 09/07/14  1415 History   First MD Initiated Contact with Patient 09/07/14 1501     Chief Complaint  Patient presents with  . Alcohol Intoxication     (Consider location/radiation/quality/duration/timing/severity/associated sxs/prior Treatment) HPI Comments: Patient brought to ED by EMS for intoxication.  Patient has been seen here several times for the same.  History is limited 2/2 to intoxication.  Level 5 caveat applies.  Reports drinking unknown amount of beer.  States that he wants to quit drinking.  Reports trying to stop.  Denies any SI/HI.  Denies auditory or visual hallucinations.  Patient is a 44 y.o. male presenting with intoxication. The history is provided by the patient. No language interpreter was used.  Alcohol Intoxication This is a recurrent problem. The current episode started in the past 7 days. The problem occurs daily. The problem has been unchanged.    Past Medical History  Diagnosis Date  . Alcohol abuse    Past Surgical History  Procedure Laterality Date  . No past surgeries     Family History  Problem Relation Age of Onset  . Family history unknown: Yes   History  Substance Use Topics  . Smoking status: Current Every Day Smoker  . Smokeless tobacco: Not on file  . Alcohol Use: Yes     Comment: daily    Review of Systems  Unable to perform ROS: Other      Allergies  Review of patient's allergies indicates no known allergies.  Home Medications   Prior to Admission medications   Medication Sig Start Date End Date Taking? Authorizing Provider  FLUoxetine (PROZAC) 20 MG capsule Take 20 mg by mouth daily.    Historical Provider, MD  gabapentin (NEURONTIN) 300 MG capsule Take 300 mg by mouth 3 (three) times daily.    Historical Provider, MD  OLANZapine (ZYPREXA) 10 MG tablet Take 10 mg by mouth at bedtime.    Historical Provider, MD  ondansetron (ZOFRAN ODT) 4 MG disintegrating tablet Take 1 tablet (4 mg  total) by mouth every 8 (eight) hours as needed for nausea or vomiting. Patient not taking: Reported on 08/30/2014 08/25/14   Teressa Lower, NP  traZODone (DESYREL) 100 MG tablet Take 100 mg by mouth at bedtime.    Historical Provider, MD   BP 118/67 mmHg  Pulse 75  Temp(Src)   Resp 18  SpO2 95% Physical Exam  Constitutional: He is oriented to person, place, and time. He appears well-developed and well-nourished.  HENT:  Head: Normocephalic and atraumatic.  No evidence of head injury  Eyes: Conjunctivae and EOM are normal.  Neck: Normal range of motion.  Cardiovascular: Normal rate.   Pulmonary/Chest: Effort normal.  Abdominal: He exhibits no distension.  Musculoskeletal: Normal range of motion.  Moves all extremities, no CTLS deformity or abnormality,   Neurological: He is alert and oriented to person, place, and time.  Intoxicated, arousable to verbal stimuli  Skin: Skin is dry.  Psychiatric: He has a normal mood and affect. His behavior is normal. Judgment and thought content normal.  Nursing note and vitals reviewed.   ED Course  Procedures (including critical care time) Results for orders placed or performed during the hospital encounter of 09/07/14  Ethanol  Result Value Ref Range   Alcohol, Ethyl (B) 449 (HH) 0 - 9 mg/dL  CBC with Differential/Platelet  Result Value Ref Range   WBC 6.6 4.0 - 10.5 K/uL   RBC 5.14 4.22 -  5.81 MIL/uL   Hemoglobin 16.3 13.0 - 17.0 g/dL   HCT 78.247.6 95.639.0 - 21.352.0 %   MCV 92.6 78.0 - 100.0 fL   MCH 31.7 26.0 - 34.0 pg   MCHC 34.2 30.0 - 36.0 g/dL   RDW 08.613.5 57.811.5 - 46.915.5 %   Platelets 83 (L) 150 - 400 K/uL   Neutrophils Relative % 49 43 - 77 %   Neutro Abs 3.2 1.7 - 7.7 K/uL   Lymphocytes Relative 43 12 - 46 %   Lymphs Abs 2.8 0.7 - 4.0 K/uL   Monocytes Relative 7 3 - 12 %   Monocytes Absolute 0.5 0.1 - 1.0 K/uL   Eosinophils Relative 1 0 - 5 %   Eosinophils Absolute 0.0 0.0 - 0.7 K/uL   Basophils Relative 1 0 - 1 %   Basophils  Absolute 0.0 0.0 - 0.1 K/uL  Comprehensive metabolic panel  Result Value Ref Range   Sodium 144 135 - 145 mmol/L   Potassium 3.7 3.5 - 5.1 mmol/L   Chloride 101 96 - 112 mmol/L   CO2 26 19 - 32 mmol/L   Glucose, Bld 109 (H) 70 - 99 mg/dL   BUN 12 6 - 23 mg/dL   Creatinine, Ser 6.290.63 0.50 - 1.35 mg/dL   Calcium 8.8 8.4 - 52.810.5 mg/dL   Total Protein 8.1 6.0 - 8.3 g/dL   Albumin 4.9 3.5 - 5.2 g/dL   AST 73 (H) 0 - 37 U/L   ALT 34 0 - 53 U/L   Alkaline Phosphatase 94 39 - 117 U/L   Total Bilirubin 0.5 0.3 - 1.2 mg/dL   GFR calc non Af Amer >90 >90 mL/min   GFR calc Af Amer >90 >90 mL/min   Anion gap 17 (H) 5 - 15  Salicylate level  Result Value Ref Range   Salicylate Lvl <4.0 2.8 - 20.0 mg/dL  Acetaminophen level  Result Value Ref Range   Acetaminophen (Tylenol), Serum <10.0 (L) 10 - 30 ug/mL   Ct Head Wo Contrast  08/30/2014   CLINICAL DATA:  Unresponsive.  Ethanol intoxication.  EXAM: CT HEAD WITHOUT CONTRAST  TECHNIQUE: Contiguous axial images were obtained from the base of the skull through the vertex without intravenous contrast.  COMPARISON:  03/05/2012  FINDINGS: Skull and Sinuses:No traumatic findings.  Mild, chronic appearing mucosal thickening in right maxillary and anterior ethmoid sinuses.  Orbits: No acute abnormality.  Brain: No evidence of acute infarction, hemorrhage, hydrocephalus, or mass lesion/mass effect. There is brain atrophy, particularly of the cerebellum. This pattern correlates with history of alcohol dependence.  IMPRESSION: 1. No acute intracranial findings. 2. Brain atrophy, as above.   Electronically Signed   By: Marnee SpringJonathon  Watts M.D.   On: 08/30/2014 06:14      EKG Interpretation None      MDM   Final diagnoses:  Intoxication   Patient observed for nearly 8 hours in the ED.  He requested to leave AMA after he awoke and was able to ambulate.  I was not notified of this, but the patient has a long history of similar behavior.     Roxy Horsemanobert Malakhai Beitler,  PA-C 09/07/14 2231  Linwood DibblesJon Knapp, MD 09/07/14 [phone]

## 2014-09-07 NOTE — ED Notes (Signed)
[place] appears etoh intoxicated. Pt admitted to drink. Pt has been seen here several time this wk for similar symp. Pt is arousal to verbal; stimuli.

## 2014-09-07 NOTE — ED Notes (Signed)
Pt appears in NAD, vitals WNL. Asleep in bed. Patient will not acknowledge or wake up for exam.

## 2014-09-07 NOTE — ED Notes (Signed)
Pt alert and ambulatory; states "I am not staying"; pt is requesting to leave AMA

## 2014-09-07 NOTE — ED Notes (Signed)
Pt states he wants to leave. Does not want to wait to talk to a doctor. Pt refuses to sign AMA form. Pt escorted out of facility by police.

## 2014-09-07 NOTE — ED Notes (Signed)
[facility] Expected date:  Expected time:  Means of arrival:  Comments: EMS- ETOH, non-ambulatory

## 2014-09-08 ENCOUNTER — Encounter (HOSPITAL_COMMUNITY): Payer: Self-pay | Admitting: *Deleted

## 2014-09-08 ENCOUNTER — Emergency Department (HOSPITAL_COMMUNITY)
Admission: EM | Admit: 2014-09-08 | Discharge: 2014-09-08 | Disposition: A | Discharge status: Home / Self Care | Payer: Self-pay | Attending: Emergency Medicine | Admitting: Emergency Medicine

## 2014-09-08 DIAGNOSIS — R000 Tachycardia, unspecified: Secondary | ICD-10-CM | POA: Insufficient documentation

## 2014-09-08 DIAGNOSIS — F10220 Alcohol dependence with intoxication, uncomplicated: Secondary | ICD-10-CM | POA: Insufficient documentation

## 2014-09-08 DIAGNOSIS — F3131 Bipolar disorder, current episode depressed, mild: Secondary | ICD-10-CM | POA: Insufficient documentation

## 2014-09-08 DIAGNOSIS — Z720 Tobacco use: Secondary | ICD-10-CM | POA: Insufficient documentation

## 2014-09-08 DIAGNOSIS — Z79899 Other long term (current) drug therapy: Secondary | ICD-10-CM | POA: Insufficient documentation

## 2014-09-08 HISTORY — DX: Bipolar disorder, unspecified: F31.9

## 2014-09-08 HISTORY — DX: Major depressive disorder, single episode, unspecified: F32.9

## 2014-09-08 HISTORY — DX: Depression, unspecified: F32.A

## 2014-09-08 MED ORDER — LORAZEPAM 1 MG PO TABS
1.0000 mg | ORAL_TABLET | Freq: Once | ORAL | Status: AC
  Administered 2014-09-08: 1 mg via ORAL
  Filled 2014-09-08: qty 1

## 2014-09-08 NOTE — ED Provider Notes (Signed)
CSN: [id number]     Arrival date & time 09/08/14  4098 History   First MD Initiated Contact with Patient 09/08/14 1625     Chief Complaint  Patient presents with  . Alcohol Intoxication     (Consider location/radiation/quality/duration/timing/severity/associated sxs/prior Treatment) HPI    PCP: No PCP Per Patient Blood pressure 115/79, pulse 108, temperature 98.1 F (36.7 C), temperature source Oral, resp. rate 16, SpO2 94 %.  Coye Dawood is a 44 y.o.male with a significant PMH of alcohol abuse presents to the ER with complaints of alcohol intoxication and requests detox. This is the patients 10 th day in March that he has been in the ED with alcohol intoxication. He reports wants to detox. He had a few drinks of Listerine this morning and also had a couple forties. He also reports that he wants to go home to [facility] which is why he drinks so much. Denies abusing other substances, denies SIHI, denies hallucinations.    Past Medical History  Diagnosis Date  . Alcohol abuse   . Depression   . Bipolar 1 disorder, depressed    Past Surgical History  Procedure Laterality Date  . No past surgeries     Family History  Problem Relation Age of Onset  . Family history unknown: Yes   History  Substance Use Topics  . Smoking status: Current Every Day Smoker  . Smokeless tobacco: Not on file  . Alcohol Use: Yes     Comment: daily - "whatever I can get" - 1/5 plus daily    Review of Systems  10 Systems reviewed and are negative for acute change except as noted in the HPI.     Allergies  Review of patient's allergies indicates no known allergies.  Home Medications   Prior to Admission medications   Medication Sig Start Date End Date Taking? Authorizing Provider  FLUoxetine (PROZAC) 20 MG capsule Take 20 mg by mouth daily.    Historical Provider, MD  gabapentin (NEURONTIN) 300 MG capsule Take 300 mg by mouth 3 (three) times daily.    Historical Provider, MD  OLANZapine  (ZYPREXA) 10 MG tablet Take 10 mg by mouth at bedtime.    Historical Provider, MD  ondansetron (ZOFRAN ODT) 4 MG disintegrating tablet Take 1 tablet (4 mg total) by mouth every 8 (eight) hours as needed for nausea or vomiting. Patient not taking: Reported on 08/30/2014 08/25/14   Teressa Lower, NP  traZODone (DESYREL) 100 MG tablet Take 100 mg by mouth at bedtime.    Historical Provider, MD   BP 115/79 mmHg  Pulse 108  Temp(Src) 98.1 F (36.7 C) (Oral)  Resp 16  SpO2 94% Physical Exam  Constitutional: He is oriented to person, place, and time. He appears well-developed and well-nourished. No distress.  HENT:  Head: Normocephalic and atraumatic.  Eyes: Pupils are equal, round, and reactive to light.  Neck: Normal range of motion. Neck supple.  Cardiovascular: Normal rate and regular rhythm.   Pulmonary/Chest: Effort normal.  Abdominal: Soft.  Neurological: He is alert and oriented to person, place, and time. He has normal strength. No cranial nerve deficit or sensory deficit. GCS eye subscore is 4. GCS verbal subscore is 5. GCS motor subscore is 6.  Skin: Skin is warm and dry.  Psychiatric: His speech is normal and behavior is normal. His mood appears not anxious. He does not exhibit a depressed mood. He expresses no homicidal and no suicidal ideation. He expresses no suicidal plans and no homicidal  plans.  Nursing note and vitals reviewed.   ED Course  Procedures (including critical care time) Labs Review Labs Reviewed - No data to display  Imaging Review No results found.   EKG Interpretation None      MDM   Final diagnoses:  Alcohol dependence with uncomplicated intoxication    Pt now sober and wanting to detox from alcohol. He is able to walk and has normal vital signs, he is not exhibiting symptoms of withdrawal at this time- slightly tachycardic at 108 but normotensive, no fevers, and no tremors.. He has had numerous visits to the ED for ETOH and requests more food  and admits he wanted somewhere to lay up. I discussed giving him medication to help him detox with medications with intensive outpatient or inpatient [facility] but he will need to go there himself. He reports not money for medications. Burna MortimerWanda with CM has sent a note to [facility] reporting the patient was discharged from the ER today and needs assessment and medication assistance- [facility] tomorrow morning.  Medications  LORazepam (ATIVAN) tablet 1 mg (not administered)    43 y.o.Zakk Schreckengost's evaluation in the Emergency Department is complete. It has been determined that no acute conditions requiring further emergency intervention are present at this time. The patient/guardian have been advised of the diagnosis and plan. We have discussed signs and symptoms that warrant return to the ED, such as changes or worsening in symptoms.  Vital signs are stable at discharge. Filed Vitals:   09/08/14 1019  BP: 115/79  Pulse: 108  Temp: 98.1 F (36.7 C)  Resp: 16    Patient/guardian has voiced understanding and agreed to follow-up with the PCP or specialist.     Marlon Peliffany Beautifull Cisar, PA-C 09/08/14 1642  Samuel JesterKathleen McManus, DO 09/11/14 1239

## 2014-09-08 NOTE — Progress Notes (Signed)
Patient provided patient resources for [facility]. Patient verbalized understanding, teach back done.  No further CM needs identified.

## 2014-09-08 NOTE — ED Notes (Signed)
Called pt for triage x 2 and no answer

## 2014-09-08 NOTE — Discharge Instructions (Signed)
Alcohol Intoxication Alcohol intoxication occurs when the amount of alcohol that a person has consumed impairs his or her ability to mentally and physically function. Alcohol directly impairs the normal chemical activity of the brain. Drinking large amounts of alcohol can lead to changes in mental function and behavior, and it can cause many physical effects that can be harmful.  Alcohol intoxication can range in severity from mild to very severe. Various factors can affect the level of intoxication that occurs, such as the person's age, gender, weight, frequency of alcohol consumption, and the presence of other medical conditions (such as diabetes, seizures, or heart conditions). Dangerous levels of alcohol intoxication may occur when people drink large amounts of alcohol in a short period (binge drinking). Alcohol can also be especially dangerous when combined with certain prescription medicines or "recreational" drugs. SIGNS AND SYMPTOMS Some common signs and symptoms of mild alcohol intoxication include:  Loss of coordination.  Changes in mood and behavior.  Impaired judgment.  Slurred speech. As alcohol intoxication progresses to more severe levels, other signs and symptoms will appear. These may include:  Vomiting.  Confusion and impaired memory.  Slowed breathing.  Seizures.  Loss of consciousness. DIAGNOSIS  Your health care provider will take a medical history and perform a physical exam. You will be asked about the amount and type of alcohol you have consumed. Blood tests will be done to measure the concentration of alcohol in your blood. In many places, your blood alcohol level must be lower than 80 mg/dL (0.08%) to legally drive. However, many dangerous effects of alcohol can occur at much lower levels.  TREATMENT  People with alcohol intoxication often do not require treatment. Most of the effects of alcohol intoxication are temporary, and they go away as the alcohol naturally  leaves the body. Your health care provider will monitor your condition until you are stable enough to go home. Fluids are sometimes given through an IV access tube to help prevent dehydration.  HOME CARE INSTRUCTIONS  Do not drive after drinking alcohol.  Stay hydrated. Drink enough water and fluids to keep your urine clear or pale yellow. Avoid caffeine.   Only take over-the-counter or prescription medicines as directed by your health care provider.  SEEK MEDICAL CARE IF:   You have persistent vomiting.   You do not feel better after a few days.  You have frequent alcohol intoxication. Your health care provider can help determine if you should see a substance use treatment counselor. SEEK IMMEDIATE MEDICAL CARE IF:   You become shaky or tremble when you try to stop drinking.   You shake uncontrollably (seizure).   You throw up (vomit) blood. This may be bright red or may look like black coffee grounds.   You have blood in your stool. This may be bright red or may appear as a black, tarry, bad smelling stool.   You become lightheaded or faint.  MAKE SURE YOU:   Understand these instructions.  Will watch your condition.  Will get help right away if you are not doing well or get worse. Document Released: 03/22/2005 Document Revised: 02/12/2013 Document Reviewed: 11/15/2012 [facility] Patient Information 2015 [place], [place]. This information is not intended to replace advice given to you by your health care provider. Make sure you discuss any questions you have with your health care provider.  RESOURCE GUIDE  Chronic Pain Problems: Contact [facility]  [phone] Patients need to be referred by their primary care doctor.  Insufficient  Money for Medicine: Contact [employer]:  call "211" or [place] [phone].  No Primary Care Doctor: Call [facility] Connect  [phone] - can help you locate a primary care doctor that  accepts your insurance,  provides certain services, etc. Physician Referral Service- [phone]  Agencies that provide inexpensive medical care: [facility]  [facility]  [phone] [facility]  [phone] [facility]  [phone] [facility]  [phone] [facility]  [phone]  [place] Providers: [facility]- [address], [address]  [phone], Mon-Fri 9am-7pm, Sat 9am-1pm [place], [address], [address] [place]  [place]- [address]  [place], [address], [phone]  Only accepts [place] patients after they have their name  applied to their card  Self Pay (no insurance) in [facility]: Sickle Cell Patients: Dr Kevan Ny, [facility]  [place], [facility]- [place]  [facility] [place]- [address], [facility]- see information above (Speak to [employer] if you do not have insurance)       -  [facility]- [place], [place],  [place], [place]  Dr Vista Lawman-  [address], [place], [place] [facility]- [address], [phone]       -  [facility]- [address], [place], also [address] [phone]       -    [facility]- [address], [place], 1st & 3rd Saturday   every month, 10am-1pm  1) Find a Doctor and Pay Out of Pocket Although you won't have to find out who is covered by your insurance plan, it is a good idea to ask around and get recommendations. You  will then need to call the office and see if the doctor you have chosen will accept you as a new patient and what types of options they offer for patients who are self-pay. Some doctors offer discounts or will set up payment plans for their patients who do not have insurance, but you will need to ask so you aren't surprised when you get to your appointment.  2) Contact Your Local Health Department Not all health departments have doctors that can see patients for sick visits, but many do, so it is worth a call to see if yours does. If you don't know where your local health department is, you can check in your phone book. The CDC also has a tool to help you locate your state's health department, and many state websites also have listings of all of their local health departments.  3) Find a [place] [facility] If your illness is not likely to be very severe or complicated, you may want to try a walk in clinic. These are popping up all  over the country in pharmacies, drugstores, and shopping centers. They're usually staffed by nurse practitioners or physician assistants that have been trained to treat common illnesses and complaints. They're usually fairly quick and inexpensive. However, if you have serious medical issues or chronic medical problems, these are probably not your best option  STD [place], [facility], [address] [place], phone [phone] or [phone].  Monday - Friday, call for an appointment. [place], [facility], [address], [place], phone [phone] or [phone].  Monday - Friday, call for an appointment.  Abuse/Neglect: [place] [phone] [place] [phone] (After Hours)  Emergency Shelter:  [facility] [phone]  Maternity Homes: Room at the [place] [phone] [place] [phone]  MRSA Hotline #:   587-574-0405  [facility] Dept. [address]         [place] Phone:  [phone]                                  Phone:  [phone]                   Phone:  [phone]  [facility], [place]- [phone]       -     [facility] in [place], [address]                                  [place] [phone] or [phone] (After Hours)   [place]  Substance Abuse Resources: Alcohol and Drug Services  [phone] [place] [phone] [facility] Chinita Pester [phone] Residential & Outpatient Substance Abuse Program  [phone]  Psychological Services: [place]  [phone] [place]  [place], [place]. [address] [place], [place]: [phone] or [phone], [email or web address]  Dental Assistance  If unable to pay or uninsured, contact:  [facility] or [facility]. to become qualified for the adult dental clinic.  Patients with Medicaid: [facility] [id number] [address] [place], [place] [address] [phone]  If unable to pay, or uninsured, contact [facility] [phone]) or [place] [phone] in [place], [place] in [facility]) to become qualified for the adult dental clinic  Other [place]- [place], [place], [place], [address], [place], [place], 2nd and 4th  Thursday of the month at 6:30am.  10 clients each day by appointment, can sometimes see walk-in patients if someone does not show for an appointment. [facility]- [address] [place] D[place], [place], [address], [place], [place], [place], [address], [place] [facility] [phone]  Please make every effort to establish with a primary care physician for routine medical care  [place]  The [place] provides a wide range of adult health services. Some of these services are designed to address the healthcare needs of all [facility] residents and all services are designed to meet the needs of uninsured/underinsured low income residents. Some services are available to any resident of [place], call [phone] for details. ] The [facility], a new medical clinic for adults, is now open. For more information about the Center and its services please call [phone]. For information on our [place] services, click here.  For more information on any of the following Department of Public Health programs, including hours of service, click on the highlighted link.  SERVICES FOR WOMEN (Adults and Teens) [employer] provide a full range of birth control options plus education and counseling. New patient visit and annual return visits include a complete examination, pap test as indicated, and other laboratory as indicated. Included is our [employer] for men.  Maternity Care is provided through pregnancy, including a six week post partum exam. Women who meet eligibility criteria for the Medicaid for Pregnant Women program, receive care free. Other women are charged on a sliding scale according to income. Note: [facility]  provides services to pregnant women who have a Medicaid card. Call [phone] for an appointment in [place] or [phone] for an appointment in [facility].  Primary Care for Medicaid [place] Access Women is available through the [place]. As primary care provider for the [place] program, women may designate the [facility] clinic as their primary care provider.  PLEASE CALL R5958090 FOR AN APPOINTMENT FOR THE ABOVE SERVICES IN EITHER [place] OR [place]. Information available in [place] and [place].   Childbirth Education Classes are open to the public and offered to help families prepare for the best possible childbirth experience as well as to promote lifelong health and wellness. Classes are offered throughout the year and meet on the same night once a week for five weeks. Medicaid covers the cost of the classes for the mother-to-be and her partner. For participants without Medicaid, the cost of the class series is $45.00 for the mother-to-be and her partner. Class size is limited and registration is required. For more information or to register call [phone]. Baby items donated by [employer] and the [employer] [place] are given away during each class series.  SERVICES FOR WOMEN AND MEN Sexually Transmitted Infection appointments, including HIV testing, are available daily (weekdays, except holidays). Call early as same-day appointments are limited. For an appointment in either [facility] or [place], call [phone]. Services are confidential and free of charge.  Skin Testing for Tuberculosis Please call [phone]. Adult Immunizations are available, usually for a fee. Please call [phone] for details.  PLEASE CALL [phone] FOR AN APPOINTMENT FOR THE ABOVE SERVICES IN EITHER [place] OR [place].   [facility] provides up to the minute recommended vaccines for your  travel destination. We also provide  essential health and political information to help insure a safe and pleasurable travel experience. This program is self-sustaining, however, fees are very competitive. We are a CERTIFIED YELLOW FEVER IMMUNIZATION approved clinic site. PLEASE CALL [phone] FOR AN APPOINTMENT IN EITHER [place] OR [place].   If you have questions about the services listed above, we want to answer them! Email [place] at: [email or web address] Home Visiting Services for elderly and the disabled are available to residents of [facility] who are in need of care that compares to the care offered by a nursing home, have needs that can be met by the program, and have [place] Medicaid. Other short term services are available to residents 18 years and older who are unable to meet requirements for eligibility to receive services from a certified home health agency, spend the majority of time at home, and need care for six months or less.  PLEASE CALL H548482 OR [phone] FOR MORE INFORMATION. Medication Assistance Program serves as a link between pharmaceutical companies and patients to provide low cost or free prescription medications. This servce is available for residents who meet certain income restrictions and have no insurance coverage.  PLEASE CALL [phone] () OR [phone] ([place]) FOR MORE INFORMATION.  Updated Feb. 21, 2013

## 2014-09-08 NOTE — ED Notes (Signed)
Pt states that he drank "a couple of bottles of Listerine" this morning. Pt states that he also drank "a couple of forties". Pt states [place] when asked why he drank this morning. t states that he was not trying to harm himself.

## 2014-09-10 ENCOUNTER — Encounter (HOSPITAL_COMMUNITY): Payer: Self-pay | Admitting: Emergency Medicine

## 2014-09-10 ENCOUNTER — Emergency Department (HOSPITAL_COMMUNITY)
Admission: EM | Admit: 2014-09-10 | Discharge: 2014-09-10 | Disposition: A | Discharge status: Home / Self Care | Payer: Self-pay | Attending: Emergency Medicine | Admitting: Emergency Medicine

## 2014-09-10 DIAGNOSIS — F10120 Alcohol abuse with intoxication, uncomplicated: Secondary | ICD-10-CM | POA: Insufficient documentation

## 2014-09-10 DIAGNOSIS — Y908 Blood alcohol level of 240 mg/100 ml or more: Secondary | ICD-10-CM | POA: Insufficient documentation

## 2014-09-10 DIAGNOSIS — Z79899 Other long term (current) drug therapy: Secondary | ICD-10-CM | POA: Insufficient documentation

## 2014-09-10 DIAGNOSIS — Z720 Tobacco use: Secondary | ICD-10-CM | POA: Insufficient documentation

## 2014-09-10 DIAGNOSIS — F10920 Alcohol use, unspecified with intoxication, uncomplicated: Secondary | ICD-10-CM

## 2014-09-10 DIAGNOSIS — F319 Bipolar disorder, unspecified: Secondary | ICD-10-CM | POA: Insufficient documentation

## 2014-09-10 LAB — CBC WITH DIFFERENTIAL/PLATELET
BASOS ABS: 0 10*3/uL (ref 0.0–0.1)
Basophils Relative: 0 % (ref 0–1)
EOS ABS: 0.1 10*3/uL (ref 0.0–0.7)
EOS PCT: 1 % (ref 0–5)
HCT: 41.3 % (ref 39.0–52.0)
Hemoglobin: 14.2 g/dL (ref 13.0–17.0)
Lymphocytes Relative: 33 % (ref 12–46)
Lymphs Abs: 1.9 10*3/uL (ref 0.7–4.0)
MCH: 31.7 pg (ref 26.0–34.0)
MCHC: 34.4 g/dL (ref 30.0–36.0)
MCV: 92.2 fL (ref 78.0–100.0)
Monocytes Absolute: 0.5 10*3/uL (ref 0.1–1.0)
Monocytes Relative: 9 % (ref 3–12)
NEUTROS PCT: 57 % (ref 43–77)
Neutro Abs: 3.2 10*3/uL (ref 1.7–7.7)
Platelets: 40 10*3/uL — ABNORMAL LOW (ref 150–400)
RBC: 4.48 MIL/uL (ref 4.22–5.81)
RDW: 13.6 % (ref 11.5–15.5)
WBC: 5.6 10*3/uL (ref 4.0–10.5)

## 2014-09-10 LAB — COMPREHENSIVE METABOLIC PANEL
ALBUMIN: 4.1 g/dL (ref 3.5–5.2)
ALK PHOS: 85 U/L (ref 39–117)
ALT: 47 U/L (ref 0–53)
AST: 121 U/L — ABNORMAL HIGH (ref 0–37)
Anion gap: 14 (ref 5–15)
BUN: 9 mg/dL (ref 6–23)
CO2: 26 mmol/L (ref 19–32)
Calcium: 8.4 mg/dL (ref 8.4–10.5)
Chloride: 101 mmol/L (ref 96–112)
Creatinine, Ser: 0.61 mg/dL (ref 0.50–1.35)
GFR calc Af Amer: >90 mL/min (ref >90)
GFR calc non Af Amer: >90 mL/min (ref >90)
GLUCOSE: 98 mg/dL (ref 70–99)
Potassium: 3.1 mmol/L — ABNORMAL LOW (ref 3.5–5.1)
SODIUM: 141 mmol/L (ref 135–145)
TOTAL PROTEIN: 7 g/dL (ref 6.0–8.3)
Total Bilirubin: 0.5 mg/dL (ref 0.3–1.2)

## 2014-09-10 LAB — ETHANOL: ALCOHOL ETHYL (B): 376 mg/dL — AB (ref 0–9)

## 2014-09-10 MED ORDER — VITAMIN B-1 100 MG PO TABS
100.0000 mg | ORAL_TABLET | Freq: Once | ORAL | Status: AC
  Administered 2014-09-10: 100 mg via ORAL
  Filled 2014-09-10: qty 1

## 2014-09-10 MED ORDER — FOLIC ACID 1 MG PO TABS
1.0000 mg | ORAL_TABLET | Freq: Once | ORAL | Status: AC
  Administered 2014-09-10: 1 mg via ORAL
  Filled 2014-09-10: qty 1

## 2014-09-10 NOTE — Discharge Instructions (Signed)
Alcohol Intoxication  Alcohol intoxication occurs when the amount of alcohol that a person has consumed impairs his or her ability to mentally and physically function. Alcohol directly impairs the normal chemical activity of the brain. Drinking large amounts of alcohol can lead to changes in mental function and behavior, and it can cause many physical effects that can be harmful.   Alcohol intoxication can range in severity from mild to very severe. Various factors can affect the level of intoxication that occurs, such as the person's age, gender, weight, frequency of alcohol consumption, and the presence of other medical conditions (such as diabetes, seizures, or heart conditions). Dangerous levels of alcohol intoxication may occur when people drink large amounts of alcohol in a short period (binge drinking). Alcohol can also be especially dangerous when combined with certain prescription medicines or "recreational" drugs.  SIGNS AND SYMPTOMS  Some common signs and symptoms of mild alcohol intoxication include:  · Loss of coordination.  · Changes in mood and behavior.  · Impaired judgment.  · Slurred speech.  As alcohol intoxication progresses to more severe levels, other signs and symptoms will appear. These may include:  · Vomiting.  · Confusion and impaired memory.  · Slowed breathing.  · Seizures.  · Loss of consciousness.  DIAGNOSIS   Your health care provider will take a medical history and perform a physical exam. You will be asked about the amount and type of alcohol you have consumed. Blood tests will be done to measure the concentration of alcohol in your blood. In many places, your blood alcohol level must be lower than 80 mg/dL (0.08%) to legally drive. However, many dangerous effects of alcohol can occur at much lower levels.   TREATMENT   People with alcohol intoxication often do not require treatment. Most of the effects of alcohol intoxication are temporary, and they go away as the alcohol naturally  leaves the body. Your health care provider will monitor your condition until you are stable enough to go home. Fluids are sometimes given through an IV access tube to help prevent dehydration.   HOME CARE INSTRUCTIONS  · Do not drive after drinking alcohol.  · Stay hydrated. Drink enough water and fluids to keep your urine clear or pale yellow. Avoid caffeine.    · Only take over-the-counter or prescription medicines as directed by your health care provider.    SEEK MEDICAL CARE IF:   · You have persistent vomiting.    · You do not feel better after a few days.  · You have frequent alcohol intoxication. Your health care provider can help determine if you should see a substance use treatment counselor.  SEEK IMMEDIATE MEDICAL CARE IF:   · You become shaky or tremble when you try to stop drinking.    · You shake uncontrollably (seizure).    · You throw up (vomit) blood. This may be bright red or may look like black coffee grounds.    · You have blood in your stool. This may be bright red or may appear as a black, tarry, bad smelling stool.    · You become lightheaded or faint.    MAKE SURE YOU:   · Understand these instructions.  · Will watch your condition.  · Will get help right away if you are not doing well or get worse.  Document Released: 03/22/2005 Document Revised: 02/12/2013 Document Reviewed: 11/15/2012  ExitCare® Patient Information ©2015 [employer]. This information is not intended to replace advice given to you by your health care provider. Make sure   you discuss any questions you have with your health care provider.

## 2014-09-10 NOTE — ED Provider Notes (Signed)
CSN: [id number]     Arrival date & time 09/10/14  1039 History   First MD Initiated Contact with Patient 09/10/14 1043     Chief Complaint  Patient presents with  . Alcohol Intoxication     The history is provided by the patient and the EMS personnel. No language interpreter was used.   Patient comes in for evaluation of alcohol intoxication. Level V Caveat due to alcohol intoxication and poor historian. He is not sure why he is here but states he drinks vodka that he had an entire bottle of Listerine. He denies any head injury or recent illness. He has no current complaints. Symptoms are moderate and constant. He denies any active SI or intent to harm himself.  Past Medical History  Diagnosis Date  . Alcohol abuse   . Depression   . Bipolar 1 disorder, depressed    Past Surgical History  Procedure Laterality Date  . No past surgeries     Family History  Problem Relation Age of Onset  . Family history unknown: Yes   History  Substance Use Topics  . Smoking status: Current Every Day Smoker  . Smokeless tobacco: Not on file  . Alcohol Use: Yes     Comment: daily - "whatever I can get" - 1/5 plus daily    Review of Systems  All other systems reviewed and are negative.     Allergies  Review of patient's allergies indicates no known allergies.  Home Medications   Prior to Admission medications   Medication Sig Start Date End Date Taking? Authorizing Provider  FLUoxetine (PROZAC) 20 MG capsule Take 20 mg by mouth daily.    Historical Provider, MD  gabapentin (NEURONTIN) 300 MG capsule Take 300 mg by mouth 3 (three) times daily.    Historical Provider, MD  OLANZapine (ZYPREXA) 10 MG tablet Take 10 mg by mouth at bedtime.    Historical Provider, MD  ondansetron (ZOFRAN ODT) 4 MG disintegrating tablet Take 1 tablet (4 mg total) by mouth every 8 (eight) hours as needed for nausea or vomiting. Patient not taking: Reported on 08/30/2014 08/25/14   Teressa Lower, NP  traZODone  (DESYREL) 100 MG tablet Take 100 mg by mouth at bedtime.    Historical Provider, MD   BP 107/54 mmHg  Pulse 78  Temp(Src) 97.8 F (36.6 C) (Oral)  Resp 16  SpO2 92% Physical Exam  Constitutional: He is oriented to person, place, and time. He appears well-developed and well-nourished.  HENT:  Head: Normocephalic and atraumatic.  Cardiovascular: Normal rate and regular rhythm.   No murmur heard. Pulmonary/Chest: Effort normal and breath sounds normal. No respiratory distress.  Abdominal: Soft. There is no tenderness. There is no rebound and no guarding.  Musculoskeletal: He exhibits no edema or tenderness.  Neurological: He is alert and oriented to person, place, and time.  Skin: Skin is warm and dry.  Psychiatric: He has a normal mood and affect. His behavior is normal.  Nursing note and vitals reviewed.   ED Course  Procedures (including critical care time) Labs Review Labs Reviewed  COMPREHENSIVE METABOLIC PANEL - Abnormal; Notable for the following:    Potassium 3.1 (*)    AST 121 (*)    All other components within normal limits  CBC WITH DIFFERENTIAL/PLATELET - Abnormal; Notable for the following:    Platelets 40 (*)    All other components within normal limits  ETHANOL - Abnormal; Notable for the following:    Alcohol, Ethyl (B) 376 (*)  All other components within normal limits    Imaging Review No results found.   EKG Interpretation None      MDM   Final diagnoses:  Alcohol intoxication, uncomplicated    Patient here with alcohol intoxication, patient has no complaints of department. Plan to observe the patient until he feels andisabletoambulatewithsteadygait.Willreevaluateforanyevidenceofnewcomplaints.Patientisnotsuicidalorhomicidalkneeemergencydepartmentandhasnoevidenceofactivepsychosis.  Tilden FossaElizabeth Dariyah Garduno, MD 09/11/14 [phone]

## 2014-09-10 NOTE — ED Provider Notes (Signed)
Clinically sober, ambulating, stable for discharge.  1. Alcohol intoxication, uncomplicated      Jordan MochaBlair Ekansh Sherk, MD 09/11/14 [phone]

## 2014-09-10 NOTE — ED Notes (Signed)
[facility]:  Expected time:  Means of arrival:  Comments: etoh detox

## 2014-09-10 NOTE — ED Notes (Signed)
Pt ambulated in hall without difficulty. 240 mL of sprite provided.

## 2014-09-10 NOTE — ED Notes (Signed)
Per EMS- Patient was found lying on a sidewalk passed out by bystanders, who called EMS. Patient smells of Listerine and also stated that he drank Vodka as well.

## 2014-09-11 ENCOUNTER — Encounter (HOSPITAL_COMMUNITY): Payer: Self-pay | Admitting: Nurse Practitioner

## 2014-09-11 ENCOUNTER — Emergency Department (HOSPITAL_COMMUNITY)
Admission: EM | Admit: 2014-09-11 | Discharge: 2014-09-11 | Disposition: A | Discharge status: Home / Self Care | Payer: Self-pay | Attending: Emergency Medicine | Admitting: Emergency Medicine

## 2014-09-11 DIAGNOSIS — Z79899 Other long term (current) drug therapy: Secondary | ICD-10-CM | POA: Insufficient documentation

## 2014-09-11 DIAGNOSIS — F10120 Alcohol abuse with intoxication, uncomplicated: Secondary | ICD-10-CM | POA: Insufficient documentation

## 2014-09-11 DIAGNOSIS — F319 Bipolar disorder, unspecified: Secondary | ICD-10-CM | POA: Insufficient documentation

## 2014-09-11 DIAGNOSIS — Z720 Tobacco use: Secondary | ICD-10-CM | POA: Insufficient documentation

## 2014-09-11 DIAGNOSIS — F10920 Alcohol use, unspecified with intoxication, uncomplicated: Secondary | ICD-10-CM

## 2014-09-11 NOTE — ED Notes (Signed)
Bed: [facility] Expected date:  Expected time:  Means of arrival:  Comments: EMS/intoxicated 

## 2014-09-11 NOTE — ED Provider Notes (Signed)
CSN: [id number]     Arrival date & time 09/11/14  0220 History   First MD Initiated Contact with Patient 09/11/14 0308     Chief Complaint  Patient presents with  . Alcohol Intoxication      HPI Patient presents emergency department with alcohol intoxication requests detox.  He states his last drink was just prior to arrival.  He denies homicidal or suicidal thoughts.  He has no other complaints at this time.  He reports [place] is [place]   Past Medical History  Diagnosis Date  . Alcohol abuse   . Depression   . Bipolar 1 disorder, depressed    Past Surgical History  Procedure Laterality Date  . No past surgeries     Family History  Problem Relation Age of Onset  . Family history unknown: Yes   History  Substance Use Topics  . Smoking status: Current Every Day Smoker  . Smokeless tobacco: Not on file  . Alcohol Use: Yes     Comment: daily - "whatever I can get" - 1/5 plus daily    Review of Systems  All other systems reviewed and are negative.     Allergies  Review of patient's allergies indicates no known allergies.  Home Medications   Prior to Admission medications   Medication Sig Start Date End Date Taking? Authorizing Provider  FLUoxetine (PROZAC) 20 MG capsule Take 20 mg by mouth daily.    Historical Provider, MD  gabapentin (NEURONTIN) 300 MG capsule Take 300 mg by mouth 3 (three) times daily.    Historical Provider, MD  OLANZapine (ZYPREXA) 10 MG tablet Take 10 mg by mouth at bedtime.    Historical Provider, MD  ondansetron (ZOFRAN ODT) 4 MG disintegrating tablet Take 1 tablet (4 mg total) by mouth every 8 (eight) hours as needed for nausea or vomiting. Patient not taking: Reported on 08/30/2014 08/25/14   Teressa LowerVrinda Pickering, NP  traZODone (DESYREL) 100 MG tablet Take 100 mg by mouth at bedtime.    Historical Provider, MD   BP 109/77 mmHg  Pulse 85  Temp(Src) 97.6 F (36.4 C) (Oral)  Resp 17  SpO2 95% Physical Exam  Constitutional:  He is oriented to person, place, and time. He appears well-developed and well-nourished.  HENT:  Head: Normocephalic and atraumatic.  Eyes: EOM are normal.  Neck: Normal range of motion.  Cardiovascular: Normal rate, regular rhythm, normal heart sounds and intact distal pulses.   Pulmonary/Chest: Effort normal and breath sounds normal. No respiratory distress.  Abdominal: Soft. He exhibits no distension. There is no tenderness.  Musculoskeletal: Normal range of motion.  Neurological: He is alert and oriented to person, place, and time.  Skin: Skin is warm and dry.  Psychiatric: He has a normal mood and affect. Judgment normal.  Nursing note and vitals reviewed.   ED Course  Procedures (including critical care time) Labs Review Labs Reviewed - No data to display  Imaging Review No results found.   EKG Interpretation None      MDM   Final diagnoses:  Alcohol intoxication, uncomplicated    Medical screening examination completed.  No life-threatening emergency.  Outpatient resources given for alcohol abuse.    Azalia BilisKevin Ehan Freas, MD 09/11/14 [phone]

## 2014-09-11 NOTE — ED Notes (Signed)
Pt arrives via EMS due to ETOH.

## 2014-09-11 NOTE — Discharge Instructions (Signed)
°Emergency Department Resource Guide °1) Find a Doctor and Pay Out of Pocket °Although you won't have to find out who is covered by your insurance plan, it is a good idea to ask around and get recommendations. You will then need to call the office and see if the doctor you have chosen will accept you as a new patient and what types of options they offer for patients who are self-pay. Some doctors offer discounts or will set up payment plans for their patients who do not have insurance, but you will need to ask so you aren't surprised when you get to your appointment. ° °2) Contact Your Local Health Department °Not all health departments have doctors that can see patients for sick visits, but many do, so it is worth a call to see if yours does. If you don't know where your local health department is, you can check in your phone book. The CDC also has a tool to help you locate your state's health department, and many state websites also have listings of all of their local health departments. ° °3) Find a Walk-in Clinic °If your illness is not likely to be very severe or complicated, you may want to try a walk in clinic. These are popping up all over the country in pharmacies, drugstores, and shopping centers. They're usually staffed by nurse practitioners or physician assistants that have been trained to treat common illnesses and complaints. They're usually fairly quick and inexpensive. However, if you have serious medical issues or chronic medical problems, these are probably not your best option. ° °No Primary Care Doctor: °- Call [facility] at  832-8000 - they can help you locate a primary care doctor that  accepts your insurance, provides certain services, etc. °- Physician Referral Service- 1-800-533-3463 ° °Chronic Pain Problems: °Organization         Address  Phone   Notes  °[facility]  (336) 297-2271 Patients need to be referred by their primary care doctor.  ° °Medication  Assistance: °Organization         Address  Phone   Notes  °[place] [facility] [address] °[place], [place] [address] (336) 641-8030 --Must be a resident of [place] °-- Must have NO insurance coverage whatsoever (no Medicaid/ Medicare, etc.) °-- The pt. MUST have a primary care doctor that directs their care regularly and follows them in the community °  °[facility]  (866) 331-1348   °[employer]  (888) 892-1162   ° °Agencies that provide inexpensive medical care: °Organization         Address  Phone   Notes  °[facility]  (336) 832-8035   °[facility]    (336) 832-7272   °[facility] [address] °[place], [place] [address] (336) 832-4777   °[facility] [address], °[place] (336) 271-4999   °[facility]    (336) 373-0678   °[facility]    (336) 272-1050   °[facility] ° [address], [place] Phone:  (336) 832-4444, Fax:  (336) 832-4440 Hours of Operation:  9 am - 6 pm, M-F.  Also accepts Medicaid/Medicare and self-pay.  °[facility] ° [address], [place] Phone: (336) 832-3150, Fax: (336) 832-3151. Hours of Operation:  8:30 am - 5:30 pm, M-F.  Also accepts Medicaid and self-pay.  °[facility] [place] [address], [place] Phone: (336) 878-6027   °[facility] [address], [place], [place] (336)723-1848, Ext. 123 Mondays & Thursdays: 7-9 AM.  First 15 patients are seen on a first come, first serve basis. °  ° °Medicaid-accepting [place] Providers: ° °Organization         Address  Phone   Notes  °[facility] [address], [place] (336) 641-2100 Also accepts self-pay patients.  °[facility] [address], [place] ° (336) 856-9996   °[facility] [address], [place]  (336) 288-8857   °[facility] [address] [place] [address], [place] (336) 299-7000   °Veita Bland [address], [place]  ° (336) 373-1557 Only accepts [place] Access Medicaid patients after they have their name applied to their card.  ° °Self-Pay (no insurance) in [place]: ° °Organization         Address  Phone   Notes  °Sickle Cell Patients, [facility] [address], [place] (336) 832-1970   °[facility] [address], [place] (336) 832-4400   °[facility] [place] [facility] ° [address], [place] (336) 992-4800   °[facility]/Dr. Osei-Bonsu ° [address] [place] [address], [place] or [address], [place] (336) 841-8500 Phone number for both [place] and [place] locations is the same.  °[facility] [address], [place] (336) 299-0000   °[facility] [address] [place] [address], [place] or [address] (336) 852-7530 °(336) 878-2260   °[facility] [address], [place] (336) 350-1642, phone; (336) 294-5005, fax Sees patients 1st and 3rd Saturday of every month.  Must not qualify for public or private insurance (i.e. Medicaid, Medicare, [place] Health Choice, Veterans' Benefits) • Household income should be no more than 200% of the poverty level •The clinic cannot treat you if you are pregnant or think you are pregnant • Sexually transmitted diseases are not treated at the clinic.  ° ° °Dental Care: °Organization         Address  Phone  Notes  °[place] [facility] [address], [place] (336) 641-6152 Accepts children up to age 21 who are enrolled in Medicaid or [place] Health Choice; pregnant women with a Medicaid card; and children who have applied for Medicaid or [place] Health Choice, but were declined, whose parents can pay a reduced fee at time of service.  °[place]  [facility] [place]  [address], [place] (336) 641-7733 Accepts children up to age 21 who are enrolled in Medicaid or [place] Health Choice; pregnant women with a Medicaid card; and children who have applied for Medicaid or [place] Health Choice, but were declined, whose parents can pay a reduced fee at time of service.  °[facility] ° [address], [place] (336) 641-4533 Patients are seen by appointment only. Walk-ins are not accepted. [facility] will see patients 18 years of age and older. °Monday - Tuesday (8am-5pm) °Most Wednesdays (8:30-5pm) °$30 per visit, cash only  °[facility] ° [address], [place] (336) 641-4533 Patients are seen by appointment only. Walk-ins are not accepted. [facility] will see patients 18 years of age and older. °One   Wednesday Evening (Monthly: Volunteer Based).  $30 per visit, cash only  °[facility]  (919) 537-3737 for adults; Children under age 4, call Graduate Pediatric Dentistry at (919) [phone]. Children aged 4-14, please call (919) [phone] to request a pediatric application. ° Dental services are provided in all areas of dental care including fillings, crowns and bridges, complete and partial dentures, implants, gum treatment, root canals, and extractions. Preventive care is also provided. Treatment is provided to both adults and children. °Patients are selected via a lottery and there is often a waiting list. °  °[facility] [address], °[place] ° (336) 763-8833 [email or web address] °  °[facility] [address], [place], [place] (336)723-1848, Ext. 123 Second and Fourth Thursday of each month, opens at 6:30 AM; Clinic ends at 9 AM.  Patients are seen on a first-come first-served basis, and a limited number are seen during each clinic.  ° °[facility] ° [address], [place], [place] (336) 723-7904    Eligibility Requirements °You must have lived in [place], [place], or [place] counties for at least the last three months. °  You cannot be eligible for state or federal sponsored healthcare insurance, including Veterans Administration, Medicaid, or Medicare. °  You generally cannot be eligible for healthcare insurance through your employer.  °  How to apply: °Eligibility screenings are held every Tuesday and Wednesday afternoon from 1:00 pm until 4:00 pm. You do not need an appointment for the interview!  °[facility] [address], [place], [place] 336-631-2330   °[facility]  336-342-8273   °[facility]  336-703-3100   °[facility]  336-570-6415   ° °Behavioral Health Resources in the Community: °Intensive Outpatient Programs °Organization         Address  Phone  Notes  °[facility] [address], [place], [place] 336-878-6098   °[facility] [address],  [place], [place] 336-832-9800   °[facility] [address], [place], [place] ° 336-882-2125   °[facility] [address],  °[place], [place] 1-800-853-5163 or 336-641-4981   °Substance Abuse Resources °Organization         Address  Phone  Notes  °[facility]  336-882-2125   °[facility]  336-784-9470   °[facility] [place] [facility]  336-285-9073   °[facility]  336-845-3988   °Residential & Outpatient Substance Abuse Program  1-800-659-3381   °Psychological Services °Organization         Address  Phone  Notes  °[facility]  336- 832-9600   °[facility]  336- 378-7881   °[facility] [address], [place] 1-800-853-5163 or 336-641-4981   ° °Mobile Crisis Teams °Organization         Address  Phone  Notes  °[facility], Mobile Crisis Care Unit  1-877-626-1772   °Assertive °[facility] ° [address]  [place], [place] 336-834-9664   °Sharon DeEsch [address] °[place] 336-554-5454   ° °Self-Help/Support Groups °Organization         Address  Phone             Notes  °[facility] - variety of support groups  336- 373-1402 Call for more information  °Narcotics Anonymous (NA), [facility] [address], °[place]  2 meetings at this location  ° °  Residential Treatment Programs °Organization         Address  Phone  Notes  °[facility] [address],    °[place]  1-866-801-8205   °[facility] ° [address], [place], [place] 704-293-8524   °[facility] [address], [place] 336-845-3988 Admissions: 8am-3pm M-F  °[facility] [address]    °[place], [place] 336-841-1104   °[facility] [address], [place], [place] 336-379-7146   °[facility] [place] [facility] [address]  °[place], [place] 336-285-9073   °[facility] Intensive Outpatient [address], [place], [place] 336-852-3033   °[facility] [address]  °[place], [place] 1-877-615-2722 or 336-784-9470   °[facility] [address] [place], [place] 336-227-7417 Accepts Medicaid  °[facility] [address]  °[place] 1-800-659-3381 Substance Abuse/Addiction Treatment  ° °[place] Behavioral Health Resources °Organization         Address  Phone  Notes  °[facility]  (888) 581-9988   °Julie Brannon, PhD [address] [place], [place]   (336) 349-5553 or (336) 951-0000   °[facility]   [address] °[place], [place] (336) 349-4454   °[facility] [address], [place], [place] (336) 342-8316 Insurance/Medicaid/sponsorship through [facility]  °[facility] [address]                                    [place], [place] (336) 342-8316 Therapy/tele-psych/case    °[facility] [address]  ° [place], [place] (336) 349-2233    °Dr. Arfeen  (336) 349-4544   °[facility] [place]  [employer] [place] [facility] 1) [address],  °2) [address], [place] °3)  [address], [place] (336) 349-3220 °(336) 342-7768 ° °(336) 342-8140   °[place] [facility] (336) 342-1394 or (336) 342-3537 (After Hours)    ° ° °

## 2014-09-12 ENCOUNTER — Encounter (HOSPITAL_COMMUNITY): Payer: Self-pay | Admitting: Emergency Medicine

## 2014-09-12 ENCOUNTER — Emergency Department (HOSPITAL_COMMUNITY)
Admission: EM | Admit: 2014-09-12 | Discharge: 2014-09-12 | Disposition: A | Discharge status: Home / Self Care | Payer: Self-pay | Attending: Emergency Medicine | Admitting: Emergency Medicine

## 2014-09-12 DIAGNOSIS — F10120 Alcohol abuse with intoxication, uncomplicated: Secondary | ICD-10-CM | POA: Insufficient documentation

## 2014-09-12 DIAGNOSIS — Z720 Tobacco use: Secondary | ICD-10-CM | POA: Insufficient documentation

## 2014-09-12 DIAGNOSIS — Z79899 Other long term (current) drug therapy: Secondary | ICD-10-CM | POA: Insufficient documentation

## 2014-09-12 DIAGNOSIS — F319 Bipolar disorder, unspecified: Secondary | ICD-10-CM | POA: Insufficient documentation

## 2014-09-12 DIAGNOSIS — F10920 Alcohol use, unspecified with intoxication, uncomplicated: Secondary | ICD-10-CM

## 2014-09-12 NOTE — ED Provider Notes (Signed)
Pt ambulatory about ED with steady gait. Drinking coffee.  No tremor or shakes.   Normal mood/affect.   Pt is encouraged to pursue outpatient rehab/detox - referrals provided.  Vitals normal.  Pt currently appears stable for d/c.      Cathren LaineKevin Acquanetta Cabanilla, MD 09/12/14 1750

## 2014-09-12 NOTE — ED Notes (Signed)
Patient is still asleep, was compliant and let me get vitals. Patient is asleep and I asked if he was hungry, but he continued to snore, will reassess.

## 2014-09-12 NOTE — ED Provider Notes (Signed)
CSN: [id number]     Arrival date & time 09/12/14  1022 History   First MD Initiated Contact with Patient 09/12/14 1108     Chief Complaint  Patient presents with  . Alcohol Intoxication    requesting detox     (Consider location/radiation/quality/duration/timing/severity/associated sxs/prior Treatment) HPI   Jordan Davidson is a 44 y.o. male who states that he is here for help with alcoholism, and wants detoxification, from alcohol. [place] and drinks a fifth and a half of alcohol every day. He states he was drinking before coming here today. He is been here numerous times already this month. He has 19 ED evaluations in the last 6 months and no admissions. He is unable to specify any other problems. There are no other known modifying factors.   Past Medical History  Diagnosis Date  . Alcohol abuse   . Depression   . Bipolar 1 disorder, depressed    Past Surgical History  Procedure Laterality Date  . No past surgeries     Family History  Problem Relation Age of Onset  . Hypertension Mother   . Cancer Father   . Heart failure Father    History  Substance Use Topics  . Smoking status: Current Every Day Smoker  . Smokeless tobacco: Not on file  . Alcohol Use: Yes     Comment: daily - "whatever I can get" - 1/5 plus daily    Review of Systems  All other systems reviewed and are negative.     Allergies  Review of patient's allergies indicates no known allergies.  Home Medications   Prior to Admission medications   Medication Sig Start Date End Date Taking? Authorizing Provider  FLUoxetine (PROZAC) 20 MG capsule Take 20 mg by mouth daily.   Yes Historical Provider, MD  gabapentin (NEURONTIN) 300 MG capsule Take 300 mg by mouth 3 (three) times daily.   Yes Historical Provider, MD  OLANZapine (ZYPREXA) 10 MG tablet Take 10 mg by mouth at bedtime.   Yes Historical Provider, MD  Soft Lens Products (RENU MULTIPLUS LUB/REWETTING) SOLN Place 1 drop  into both eyes 4 (four) times daily as needed (dry eyes).   Yes Historical Provider, MD  traZODone (DESYREL) 100 MG tablet Take 100 mg by mouth at bedtime.   Yes Historical Provider, MD  ondansetron (ZOFRAN ODT) 4 MG disintegrating tablet Take 1 tablet (4 mg total) by mouth every 8 (eight) hours as needed for nausea or vomiting. Patient not taking: Reported on 08/30/2014 08/25/14   Teressa Lower, NP   BP 128/96 mmHg  Pulse 81  Temp(Src) 97.9 F (36.6 C) (Oral)  Resp 18  SpO2 99% Physical Exam  Constitutional: He is oriented to person, place, and time. He appears well-developed and well-nourished.  HENT:  Head: Normocephalic and atraumatic.  Right Ear: External ear normal.  Left Ear: External ear normal.  Eyes: Conjunctivae and EOM are normal. Pupils are equal, round, and reactive to light.  Neck: Normal range of motion and phonation normal. Neck supple.  Cardiovascular: Normal rate, regular rhythm and normal heart sounds.   Pulmonary/Chest: Effort normal and breath sounds normal. He exhibits no bony tenderness.  Abdominal: Soft. There is no tenderness.  Musculoskeletal: Normal range of motion.  Neurological: He is alert and oriented to person, place, and time. No cranial nerve deficit or sensory deficit. He exhibits normal muscle tone. Coordination normal.  Dysarthric, follows commands, and is able to participate in physical examination testing.  Skin: Skin is warm, dry and intact.  Psychiatric: He has a normal mood and affect. His behavior is normal. Judgment and thought content normal.  Nursing note and vitals reviewed.   ED Course  Procedures (including critical care time)  Since patient has frequent visits here and has shown a predilection for continued substance abuse and not following up with treatment as recommended, we will not initiate any evaluation or attempts at placement. He'll be observed for a period of time, and then consider disposition, when clinically sober.  Labs  Review Labs Reviewed - No data to display  Imaging Review No results found.   EKG Interpretation None      MDM   Final diagnoses:  Alcohol intoxication, uncomplicated    Recurrent alcohol intoxication  Nursing Notes Reviewed/ Care Coordinated Applicable Imaging Reviewed Interpretation of Laboratory Data incorporated into ED treatment  Care to oncoming provider team- 16:55    Mancel BaleElliott Elaf Clauson, MD 09/12/14 2017

## 2014-09-12 NOTE — ED Notes (Signed)
Pt sleeping quietly with even, unlabored resp. Pt in NAD. Will continue to monitor

## 2014-09-12 NOTE — ED Notes (Signed)
Tray was ordered for pt. Pt made aware and was provided with water while he is waiting. Pt sitting on edge of bed at this time. Pt ambulates to BR with steady gait and w/o assistance

## 2014-09-12 NOTE — ED Notes (Signed)
Pt provided with coffee per request.

## 2014-09-12 NOTE — ED Notes (Addendum)
Per EMS-#41. [facility] called EMS due to behavior of pt. Pt requested transport to hospital for alcohol detox.Pt remains alert, oriented and cooperative. Ambulated without assist

## 2014-09-12 NOTE — ED Notes (Signed)
[facility] is alert and appropriate, [facility]

## 2014-09-12 NOTE — ED Notes (Signed)
Pt ambulated to BR and back to hall bed w/o assistance

## 2014-09-12 NOTE — ED Notes (Signed)
Patient given water per request, did not want anything to eat.

## 2014-09-12 NOTE — Discharge Instructions (Signed)
It was our pleasure to provide your ER care today - we hope that you feel better.  Do not drink alcohol.  Follow up with AA, and use resource guide provided for additional community resources.  Also follow up with primary care doctor in coming week.  Return to ER if worse, new symptoms, fevers, medical emergency, other concern.     Alcohol Intoxication Alcohol intoxication occurs when the amount of alcohol that a person has consumed impairs his or her ability to mentally and physically function. Alcohol directly impairs the normal chemical activity of the brain. Drinking large amounts of alcohol can lead to changes in mental function and behavior, and it can cause many physical effects that can be harmful.  Alcohol intoxication can range in severity from mild to very severe. Various factors can affect the level of intoxication that occurs, such as the person's age, gender, weight, frequency of alcohol consumption, and the presence of other medical conditions (such as diabetes, seizures, or heart conditions). Dangerous levels of alcohol intoxication may occur when people drink large amounts of alcohol in a short period (binge drinking). Alcohol can also be especially dangerous when combined with certain prescription medicines or "recreational" drugs. SIGNS AND SYMPTOMS Some common signs and symptoms of mild alcohol intoxication include:  Loss of coordination.  Changes in mood and behavior.  Impaired judgment.  Slurred speech. As alcohol intoxication progresses to more severe levels, other signs and symptoms will appear. These may include:  Vomiting.  Confusion and impaired memory.  Slowed breathing.  Seizures.  Loss of consciousness. DIAGNOSIS  Your health care provider will take a medical history and perform a physical exam. You will be asked about the amount and type of alcohol you have consumed. Blood tests will be done to measure the concentration of alcohol in your blood. In  many places, your blood alcohol level must be lower than 80 mg/dL (1.61%) to legally drive. However, many dangerous effects of alcohol can occur at much lower levels.  TREATMENT  People with alcohol intoxication often do not require treatment. Most of the effects of alcohol intoxication are temporary, and they go away as the alcohol naturally leaves the body. Your health care provider will monitor your condition until you are stable enough to go home. Fluids are sometimes given through an IV access tube to help prevent dehydration.  HOME CARE INSTRUCTIONS  Do not drive after drinking alcohol.  Stay hydrated. Drink enough water and fluids to keep your urine clear or pale yellow. Avoid caffeine.   Only take over-the-counter or prescription medicines as directed by your health care provider.  SEEK MEDICAL CARE IF:   You have persistent vomiting.   You do not feel better after a few days.  You have frequent alcohol intoxication. Your health care provider can help determine if you should see a substance use treatment counselor. SEEK IMMEDIATE MEDICAL CARE IF:   You become shaky or tremble when you try to stop drinking.   You shake uncontrollably (seizure).   You throw up (vomit) blood. This may be bright red or may look like black coffee grounds.   You have blood in your stool. This may be bright red or may appear as a black, tarry, bad smelling stool.   You become lightheaded or faint.  MAKE SURE YOU:   Understand these instructions.  Will watch your condition.  Will get help right away if you are not doing well or get worse. Document Released: 03/22/2005 Document Revised: 02/12/2013 Document  Reviewed: 11/15/2012 ExitCare Patient Information 2015 [place], [place]. This information is not intended to replace advice given to you by your health care provider. Make sure you discuss any questions you have with your health care provider.     Alcohol Use Disorder Alcohol use  disorder is a mental disorder. It is not a one-time incident of heavy drinking. Alcohol use disorder is the excessive and uncontrollable use of alcohol over time that leads to problems with functioning in one or more areas of daily living. People with this disorder risk harming themselves and others when they drink to excess. Alcohol use disorder also can cause other mental disorders, such as mood and anxiety disorders, and serious physical problems. People with alcohol use disorder often misuse other drugs.  Alcohol use disorder is common and widespread. Some people with this disorder drink alcohol to cope with or escape from negative life events. Others drink to relieve chronic pain or symptoms of mental illness. People with a family history of alcohol use disorder are at higher risk of losing control and using alcohol to excess.  SYMPTOMS  Signs and symptoms of alcohol use disorder may include the following:   Consumption ofalcohol inlarger amounts or over a longer period of time than intended.  Multiple unsuccessful attempts to cutdown or control alcohol use.   A great deal of time spent obtaining alcohol, using alcohol, or recovering from the effects of alcohol (hangover).  A strong desire or urge to use alcohol (cravings).   Continued use of alcohol despite problems at work, school, or home because of alcohol use.   Continued use of alcohol despite problems in relationships because of alcohol use.  Continued use of alcohol in situations when it is physically hazardous, such as driving a car.  Continued use of alcohol despite awareness of a physical or psychological problem that is likely related to alcohol use. Physical problems related to alcohol use can involve the brain, heart, liver, stomach, and intestines. Psychological problems related to alcohol use include intoxication, depression, anxiety, psychosis, delirium, and dementia.   The need for increased amounts of alcohol to  achieve the same desired effect, or a decreased effect from the consumption of the same amount of alcohol (tolerance).  Withdrawal symptoms upon reducing or stopping alcohol use, or alcohol use to reduce or avoid withdrawal symptoms. Withdrawal symptoms include:  Racing heart.  Hand tremor.  Difficulty sleeping.  Nausea.  Vomiting.  Hallucinations.  Restlessness.  Seizures. DIAGNOSIS Alcohol use disorder is diagnosed through an assessment by your health care provider. Your health care provider may start by asking three or four questions to screen for excessive or problematic alcohol use. To confirm a diagnosis of alcohol use disorder, at least two symptoms must be present within a 28-month period. The severity of alcohol use disorder depends on the number of symptoms:  Mild--two or three.  Moderate--four or five.  Severe--six or more. Your health care provider may perform a physical exam or use results from lab tests to see if you have physical problems resulting from alcohol use. Your health care provider may refer you to a mental health professional for evaluation. TREATMENT  Some people with alcohol use disorder are able to reduce their alcohol use to low-risk levels. Some people with alcohol use disorder need to quit drinking alcohol. When necessary, mental health professionals with specialized training in substance use treatment can help. Your health care provider can help you decide how severe your alcohol use disorder is and what type  of treatment you need. The following forms of treatment are available:   Detoxification. Detoxification involves the use of prescription medicines to prevent alcohol withdrawal symptoms in the first week after quitting. This is important for people with a history of symptoms of withdrawal and for heavy drinkers who are likely to have withdrawal symptoms. Alcohol withdrawal can be dangerous and, in severe cases, cause death. Detoxification is  usually provided in a hospital or in-patient substance use treatment facility.  Counseling or talk therapy. Talk therapy is provided by substance use treatment counselors. It addresses the reasons people use alcohol and ways to keep them from drinking again. The goals of talk therapy are to help people with alcohol use disorder find healthy activities and ways to cope with life stress, to identify and avoid triggers for alcohol use, and to handle cravings, which can cause relapse.  Medicines.Different medicines can help treat alcohol use disorder through the following actions:  Decrease alcohol cravings.  Decrease the positive reward response felt from alcohol use.  Produce an uncomfortable physical reaction when alcohol is used (aversion therapy).  Support groups. Support groups are run by people who have quit drinking. They provide emotional support, advice, and guidance. These forms of treatment are often combined. Some people with alcohol use disorder benefit from intensive combination treatment provided by specialized substance use treatment centers. Both inpatient and outpatient treatment programs are available. Document Released: 07/20/2004 Document Revised: 10/27/2013 Document Reviewed: 09/19/2012 [facility] Patient Information 2015 [place], [place]. This information is not intended to replace advice given to you by your health care provider. Make sure you discuss any questions you have with your health care provider.      Emergency Department Resource Guide 1) Find a Doctor and Pay Out of Pocket Although you won't have to find out who is covered by your insurance plan, it is a good idea to ask around and get recommendations. You will then need to call the office and see if the doctor you have chosen will accept you as a new patient and what types of options they offer for patients who are self-pay. Some doctors offer discounts or will set up payment plans for their patients who do not  have insurance, but you will need to ask so you aren't surprised when you get to your appointment.  2) Contact Your Local Health Department Not all health departments have doctors that can see patients for sick visits, but many do, so it is worth a call to see if yours does. If you don't know where your local health department is, you can check in your phone book. The CDC also has a tool to help you locate your state's health department, and many state websites also have listings of all of their local health departments.  3) Find a Walk-in Clinic If your illness is not likely to be very severe or complicated, you may want to try a walk in clinic. These are popping up all over the country in pharmacies, drugstores, and shopping centers. They're usually staffed by nurse practitioners or physician assistants that have been trained to treat common illnesses and complaints. They're usually fairly quick and inexpensive. However, if you have serious medical issues or chronic medical problems, these are probably not your best option.  No Primary Care Doctor: - Call Health Connect at  [phone] - they can help you locate a primary care doctor that  accepts your insurance, provides certain services, etc. - Physician Referral Service- [phone]  Chronic Pain Problems:  Organization         Address  Phone   Notes  [facility]  [phone] Patients need to be referred by their primary care doctor.   Medication Assistance: Organization         Address  Phone   Notes  [facility] Medication [facility] [address] [place] [place], [place] [address] [phone] --Must be a resident of [facility] -- Must have NO insurance coverage whatsoever (no Medicaid/ Medicare, etc.) -- The pt. MUST have a primary care doctor that directs their care regularly and follows them in the community   [facility]ist  [phone]   [employer]  [phone]    Agencies that  provide inexpensive medical care: Organization         Address  Phone   Notes  [facility]  [phone]   [facility]    [phone]   [facility] [address] [place], [place] [address] [phone]   [facility] [address], [place] [phone]   Planned Parenthood    [phone]   [facility]    [phone]   [facility] and [facility]  [address], [place] Phone:  [phone], Fax:  [phone] Hours of Operation:  9 am - 6 pm, M-F.  Also accepts Medicaid/Medicare and self-pay.  [facility]  [address], [place] Phone: [phone], Fax: [phone]. Hours of Operation:  8:30 am - 5:30 pm, M-F.  Also accepts Medicaid and self-pay.  [facility] [place] [address], [place] Phone: [phone]   [facility] [address] Natasha Bence [place], [place] [phone], Ext. [phone] Mondays & Thursdays: 7-9 AM.  First 15 patients are seen on a first come, first serve basis.    Medicaid-accepting [facility] Providers:  Organization         Address  Phone   Notes  [facility] [address],  [phone] Also accepts self-pay patients.  [facility] [address] Laurell Josephs [place], [place]  [phone]   [facility] [address], [place] [phone]   [facility] [address] [place] [phone]   Renaye Rakers [address], [place]   [phone] Only accepts [place] patients after they have their name applied to their card.   Self-Pay (no insurance) in [facility]:  Organization         Address  Phone   Notes  Sickle Cell Patients, [facility] Internal Medicine [address] [place], [place] [phone]   [facility]  Urgent Care [address] [place], [place] [phone]   [facility] [place]  [address], [place] [phone]   [facility]/Dr. Osei-Bonsu  [address], [place] or [address], [place] [phone] Phone number for both [place] and [place] locations is the same.  [facility] and [facility] [address] [place] [phone]   [facility] [address], [place] or [address] [phone]   [facility] [address], [place] [phone], phone; [phone], fax Sees patients 1st and  3rd Saturday of every month.  Must not qualify for public or private insurance (i.e. Medicaid, Medicare, [place] Health Choice, Veterans' Benefits)  Household income should be no more than 200% of the poverty level The clinic cannot treat you if you are pregnant or think you are pregnant  Sexually transmitted diseases are not treated at the clinic.    Dental Care: Organization         Address  Phone  Notes  [facility] Department of [facility] [address] [place], [place] [phone] Accepts children up to age 14 who are enrolled in [place] or [place] Health Choice; pregnant women with a Medicaid card; and children who have applied for Medicaid or [place] Health Choice, but were declined, whose parents can pay a reduced fee at time of service.  [facility] Department of [facility]  [address], [place] [phone] Accepts children up to age 25 who are enrolled in [place] or [place] Health Choice; pregnant women with a Medicaid card; and children who have applied for Medicaid or [place] Health Choice, but were declined, whose parents can pay a reduced fee at time of service.  [facility]  [address] [place], [place] [phone] Patients are seen by appointment only. Walk-ins  are not accepted. [facility] will see patients 67 years of age and older. Monday - Tuesday (8am-5pm) Most Wednesdays (8:30-5pm) $30 per visit, cash only  [facility] Adult [facility] Access [facility]  [address], [facility] [phone] Patients are seen by appointment only. Walk-ins are not accepted. [facility] will see patients 27 years of age and older. One Wednesday Evening (Monthly: Volunteer Based).  $30 per visit, cash only  [employer]  [phone] for adults; Children under age 106, call Graduate Pediatric Dentistry at [phone]. Children aged 9-14, please call [phone] to request a pediatric application.  Dental services are provided in all areas of dental care including fillings, crowns and bridges, complete and partial dentures, implants, gum treatment, root canals, and extractions. Preventive care is also provided. Treatment is provided to both adults and children. Patients are selected via a lottery and there is often a waiting list.   [facility] [address], [place]  [phone] [email or web address]   [facility] [address] [place], [place] [phone], Ext. [phone] Second and Fourth Thursday of each month, opens at 6:30 AM; Clinic ends at 9 AM.  Patients are seen on a first-come first-served basis, and a limited number are seen during each clinic.   [facility]  [address] Griffins [place], [place] [phone]   Eligibility Requirements You must have lived in [place], [place], or [place] counties for at least the last three months.   You cannot be eligible for state or federal sponsored [place], including [employer], [place], or [employer].   You generally cannot be eligible for healthcare insurance through your employer.    How to apply: Eligibility screenings are held every Tuesday and Wednesday afternoon from 1:00 pm until 4:00 pm. You do not need an appointment  for the interview!  [facility] [address] [place], [place] [phone]   [facility] Health Department  [phone]   [facility] Health Department  [phone]   [facility] Health Department  [phone]    Behavioral Health Resources in the Community: Intensive Outpatient Programs Organization  Address  Phone  Notes  [facility] [address], [place], [place] [phone]   [facility] [address], [place], [place] [phone]   [facility] [address], [place], [place]  [phone]   [facility] [address],  [place], [place] [phone] or [phone]   Substance Abuse Resources Organization         Address  Phone  Notes  [facility]  [phone]   [facility]  [phone]   [facility]  [phone]   [facility]  [phone]   Residential & Outpatient Substance Abuse Program  [phone]   Psychological Services Organization         Address  Phone  Notes  [address] [facility]  [phone]- [phone]   [facility]  [phone]- [phone]   [facility] [address], [place] [phone] or [phone]    Mobile Crisis Teams Organization         Address  Phone  Notes  [facility], Mobile Crisis Care Unit  [phone]   [facility]  [address] [place], [place] [phone]   Doristine LocksSharon DeEsch [address] [place] [phone]    Self-Help/Support Groups Organization         Address  Phone             Notes  [facility] [place] - variety of support groups  [phone] Call for more information  Narcotics Anonymous (NA), [facility] [address], [place]  2 meetings at this location   Statisticianesidential Treatment Programs Organization         Address  Phone  Notes  [facility]  [facility] [address],    [place]  [phone]   [facility]  [address], [place] [phone], [place], [place] [phone]   [facility] [address], [place] [phone] Admissions: 8am-3pm M-F  [facility] [address]    [place], [place] [phone]   [facility] [address], [place], [place] [phone]   [facility] [address]  [place], [place] [phone]   [facility] - Intensive Outpatient [address], [place], [place] [phone]   [facility] ([facility] [address]  [place], [place] [phone] or [phone]   [facility] ([facility]) [address] [place], [place] [phone] Accepts Medicaid  [facility] [address]  [place] [phone] Substance Abuse/Addiction Treatment   [facility] Resources Organization         Address  Phone  Notes  [facility]  [phone]   Angie FavaJulie Brannon, PhD [address], [address] [place], [place]   [phone] or [phone]   [facility]   [address] [place], [place] [phone]   [facility] [address], [place], [place] [phone] Insurance/Medicaid/sponsorship through [facility]  [facility] [address]                                    [place], [place] [phone] Therapy/tele-psych/case  [facility] [address]   [place], [place] [phone]    Dr. Lolly MustacheArfeen  [phone]   [facility]  Felt Dept. 1) [address] [place] 2) [place] 3)  [address], [place] [phone]  [phone]   [place] [phone] or [phone] (After Hours)

## 2014-09-13 ENCOUNTER — Emergency Department (HOSPITAL_COMMUNITY)
Admission: EM | Admit: 2014-09-13 | Discharge: 2014-09-13 | Disposition: A | Discharge status: Home / Self Care | Payer: Federal, State, Local not specified - Other | Attending: Emergency Medicine | Admitting: Emergency Medicine

## 2014-09-13 ENCOUNTER — Encounter (HOSPITAL_COMMUNITY): Payer: Self-pay | Admitting: Emergency Medicine

## 2014-09-13 DIAGNOSIS — Z79899 Other long term (current) drug therapy: Secondary | ICD-10-CM | POA: Insufficient documentation

## 2014-09-13 DIAGNOSIS — Z720 Tobacco use: Secondary | ICD-10-CM | POA: Insufficient documentation

## 2014-09-13 DIAGNOSIS — F1010 Alcohol abuse, uncomplicated: Secondary | ICD-10-CM | POA: Insufficient documentation

## 2014-09-13 DIAGNOSIS — F10230 Alcohol dependence with withdrawal, uncomplicated: Secondary | ICD-10-CM

## 2014-09-13 DIAGNOSIS — F319 Bipolar disorder, unspecified: Secondary | ICD-10-CM | POA: Insufficient documentation

## 2014-09-13 LAB — CBC WITH DIFFERENTIAL/PLATELET
Basophils Absolute: 0 10*3/uL (ref 0.0–0.1)
Basophils Relative: 1 % (ref 0–1)
EOS ABS: 0 10*3/uL (ref 0.0–0.7)
EOS PCT: 1 % (ref 0–5)
HCT: 37.6 % — ABNORMAL LOW (ref 39.0–52.0)
HEMOGLOBIN: 12.9 g/dL — AB (ref 13.0–17.0)
LYMPHS ABS: 1.9 10*3/uL (ref 0.7–4.0)
Lymphocytes Relative: 38 % (ref 12–46)
MCH: 31.5 pg (ref 26.0–34.0)
MCHC: 34.3 g/dL (ref 30.0–36.0)
MCV: 91.9 fL (ref 78.0–100.0)
MONO ABS: 0.5 10*3/uL (ref 0.1–1.0)
Monocytes Relative: 10 % (ref 3–12)
NEUTROS PCT: 51 % (ref 43–77)
Neutro Abs: 2.6 10*3/uL (ref 1.7–7.7)
PLATELETS: 33 10*3/uL — AB (ref 150–400)
RBC: 4.09 MIL/uL — AB (ref 4.22–5.81)
RDW: 13.7 % (ref 11.5–15.5)
WBC: 5 10*3/uL (ref 4.0–10.5)

## 2014-09-13 LAB — COMPREHENSIVE METABOLIC PANEL
ALBUMIN: 4 g/dL (ref 3.5–5.2)
ALT: 59 U/L — AB (ref 0–53)
AST: 130 U/L — ABNORMAL HIGH (ref 0–37)
Alkaline Phosphatase: 88 U/L (ref 39–117)
Anion gap: 13 (ref 5–15)
BUN: 10 mg/dL (ref 6–23)
CALCIUM: 8.8 mg/dL (ref 8.4–10.5)
CO2: 29 mmol/L (ref 19–32)
Chloride: 100 mmol/L (ref 96–112)
Creatinine, Ser: 0.81 mg/dL (ref 0.50–1.35)
GFR calc Af Amer: >90 mL/min (ref >90)
GLUCOSE: 132 mg/dL — AB (ref 70–99)
Potassium: 3 mmol/L — ABNORMAL LOW (ref 3.5–5.1)
Sodium: 142 mmol/L (ref 135–145)
TOTAL PROTEIN: 6.5 g/dL (ref 6.0–8.3)
Total Bilirubin: 0.4 mg/dL (ref 0.3–1.2)

## 2014-09-13 LAB — URINALYSIS, ROUTINE W REFLEX MICROSCOPIC
GLUCOSE, UA: NEGATIVE mg/dL
Ketones, ur: NEGATIVE mg/dL
Leukocytes, UA: NEGATIVE
Nitrite: NEGATIVE
PROTEIN: 30 mg/dL — AB
Specific Gravity, Urine: 1.03 (ref 1.005–1.030)
Urobilinogen, UA: 1 mg/dL (ref 0.0–1.0)
pH: 7 (ref 5.0–8.0)

## 2014-09-13 LAB — RAPID URINE DRUG SCREEN, HOSP PERFORMED
Amphetamines: NOT DETECTED
BENZODIAZEPINES: NOT DETECTED
Barbiturates: NOT DETECTED
Cocaine: NOT DETECTED
OPIATES: NOT DETECTED
Tetrahydrocannabinol: NOT DETECTED

## 2014-09-13 LAB — SALICYLATE LEVEL

## 2014-09-13 LAB — ACETAMINOPHEN LEVEL: Acetaminophen (Tylenol), Serum: <10 ug/mL — ABNORMAL LOW (ref 10–30)

## 2014-09-13 LAB — URINE MICROSCOPIC-ADD ON

## 2014-09-13 LAB — ETHANOL: Alcohol, Ethyl (B): 281 mg/dL — ABNORMAL HIGH (ref 0–9)

## 2014-09-13 MED ORDER — LORAZEPAM 1 MG PO TABS: 0.0000 mg | ORAL_TABLET | Freq: Two times a day (BID) | ORAL | Status: DC

## 2014-09-13 MED ORDER — LORAZEPAM 1 MG PO TABS
0.0000 mg | ORAL_TABLET | Freq: Four times a day (QID) | ORAL | Status: DC
  Administered 2014-09-13: 2 mg via ORAL
  Filled 2014-09-13: qty 2

## 2014-09-13 MED ORDER — ONDANSETRON HCL 4 MG PO TABS: 4.0000 mg | ORAL_TABLET | Freq: Three times a day (TID) | ORAL | Status: DC | PRN

## 2014-09-13 MED ORDER — ALUM & MAG HYDROXIDE-SIMETH 200-200-20 MG/5ML PO SUSP: 30.0000 mL | ORAL | Status: DC | PRN

## 2014-09-13 MED ORDER — ACETAMINOPHEN 325 MG PO TABS: 650.0000 mg | ORAL_TABLET | ORAL | Status: DC | PRN

## 2014-09-13 MED ORDER — IBUPROFEN 200 MG PO TABS: 600.0000 mg | ORAL_TABLET | Freq: Three times a day (TID) | ORAL | Status: DC | PRN

## 2014-09-13 NOTE — BH Assessment (Signed)
Received notification of TTS consult request. Spoke with Emilia BeckKaitlyn Szekalski, PA-C who said Pt is drinking alcohol heavily and requesting detox. Tele-assessment will be initiated.  Harlin RainFord Ellis Ria CommentWarrick Jr, LPC, [facility] Triage Specialist [phone]

## 2014-09-13 NOTE — ED Notes (Signed)
Pt states he has not eaten in several days and requesting food.

## 2014-09-13 NOTE — ED Notes (Signed)
Pt is ambulatory with a steady gait.  States he is here because he is intoxicated and hungry.  Pt w/ multiple ED visits in 6 months will wait for provider to see pt prior to protocol ordering anything.

## 2014-09-13 NOTE — ED Notes (Signed)
PT ambulating in hall way
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
## 2014-09-13 NOTE — ED Notes (Signed)
Patient eating lunch before leaving.

## 2014-09-13 NOTE — ED Notes (Signed)
Patient is unable to provide urine sample at this time.

## 2014-09-13 NOTE — Discharge Instructions (Signed)
Alcohol Withdrawal °Anytime drug use is interfering with normal living activities it has become abuse. This includes problems with family and friends. Psychological dependence has developed when your mind tells you that the drug is needed. This is usually followed by physical dependence when a continuing increase of drugs are required to get the same feeling or "high." This is known as addiction or chemical dependency. A person's risk is much higher if there is a history of chemical dependency in the family. °Mild Withdrawal Following Stopping Alcohol, When Addiction or Chemical Dependency Has Developed °When a person has developed tolerance to alcohol, any sudden stopping of alcohol can cause uncomfortable physical symptoms. Most of the time these are mild and consist of tremors in the hands and increases in heart rate, breathing, and temperature. Sometimes these symptoms are associated with anxiety, panic attacks, and bad dreams. There may also be stomach upset. Normal sleep patterns are often interrupted with periods of inability to sleep (insomnia). This may last for 6 months. Because of this discomfort, many people choose to continue drinking to get rid of this discomfort and to try to feel normal. °Severe Withdrawal with Decreased or No Alcohol Intake, When Addiction or Chemical Dependency Has Developed °About five percent of alcoholics will develop signs of severe withdrawal when they stop using alcohol. One sign of this is development of generalized seizures (convulsions). Other signs of this are severe agitation and confusion. This may be associated with believing in things which are not real or seeing things which are not really there (delusions and hallucinations). Vitamin deficiencies are usually present if alcohol intake has been long-term. Treatment for this most often requires hospitalization and close observation. °Addiction can only be helped by stopping use of all chemicals. This is hard but may  save your life. With continual alcohol use, possible outcomes are usually loss of self respect and esteem, violence, and death. °Addiction cannot be cured but it can be stopped. This often requires outside help and the care of professionals. Treatment centers are listed in the yellow pages under Cocaine, Narcotics, and Alcoholics Anonymous. Most hospitals and clinics can refer you to a specialized care center. °It is not necessary for you to go through the uncomfortable symptoms of withdrawal. Your caregiver can provide you with medicines that will help you through this difficult period. Try to avoid situations, friends, or drugs that made it possible for you to keep using alcohol in the past. Learn how to say no. °It takes a long period of time to overcome addictions to all drugs, including alcohol. There may be many times when you feel as though you want a drink. After getting rid of the physical addiction and withdrawal, you will have a lessening of the craving which tells you that you need alcohol to feel normal. Call your caregiver if more support is needed. Learn who to talk to in your family and among your friends so that during these periods you can receive outside help. Alcoholics Anonymous (AA) has helped many people over the years. To get further help, contact AA or call your caregiver, counselor, or clergyperson. Al-Anon and Alateen are support groups for friends and family members of an alcoholic. The people who love and care for an alcoholic often need help, too. For information about these organizations, check your phone directory or call a local alcoholism treatment center.  °SEEK IMMEDIATE MEDICAL CARE IF:  °· You have a seizure. °· You have a fever. °· You experience uncontrolled vomiting or you   vomit up blood. This may be bright red or look like black coffee grounds.  You have blood in the stool. This may be bright red or appear as a black, tarry, bad-smelling stool.  You become lightheaded or  faint. Do not drive if you feel this way. Have someone else drive you or call 163 for help.  You become more agitated or confused.  You develop uncontrolled anxiety.  You begin to see things that are not really there (hallucinate). Your caregiver has determined that you completely understand your medical condition, and that your mental state is back to normal. You understand that you have been treated for alcohol withdrawal, have agreed not to drink any alcohol for a minimum of 1 day, will not operate a car or other machinery for 24 hours, and have had an opportunity to ask any questions about your condition. Document Released: 03/22/2005 Document Revised: 09/04/2011 Document Reviewed: 01/29/2008 [facility] Patient Information 2015 [place], [place]. This information is not intended to replace advice given to you by your health care provider. Make sure you discuss any questions you have with your health care provider.  Alcohol Intoxication Alcohol intoxication occurs when the amount of alcohol that a person has consumed impairs his or her ability to mentally and physically function. Alcohol directly impairs the normal chemical activity of the brain. Drinking large amounts of alcohol can lead to changes in mental function and behavior, and it can cause many physical effects that can be harmful.  Alcohol intoxication can range in severity from mild to very severe. Various factors can affect the level of intoxication that occurs, such as the person's age, gender, weight, frequency of alcohol consumption, and the presence of other medical conditions (such as diabetes, seizures, or heart conditions). Dangerous levels of alcohol intoxication may occur when people drink large amounts of alcohol in a short period (binge drinking). Alcohol can also be especially dangerous when combined with certain prescription medicines or "recreational" drugs. SIGNS AND SYMPTOMS Some common signs and symptoms of mild alcohol  intoxication include:  Loss of coordination.  Changes in mood and behavior.  Impaired judgment.  Slurred speech. As alcohol intoxication progresses to more severe levels, other signs and symptoms will appear. These may include:  Vomiting.  Confusion and impaired memory.  Slowed breathing.  Seizures.  Loss of consciousness. DIAGNOSIS  Your health care provider will take a medical history and perform a physical exam. You will be asked about the amount and type of alcohol you have consumed. Blood tests will be done to measure the concentration of alcohol in your blood. In many places, your blood alcohol level must be lower than 80 mg/dL (0.08%) to legally drive. However, many dangerous effects of alcohol can occur at much lower levels.  TREATMENT  People with alcohol intoxication often do not require treatment. Most of the effects of alcohol intoxication are temporary, and they go away as the alcohol naturally leaves the body. Your health care provider will monitor your condition until you are stable enough to go home. Fluids are sometimes given through an IV access tube to help prevent dehydration.  HOME CARE INSTRUCTIONS  Do not drive after drinking alcohol.  Stay hydrated. Drink enough water and fluids to keep your urine clear or pale yellow. Avoid caffeine.   Only take over-the-counter or prescription medicines as directed by your health care provider.  SEEK MEDICAL CARE IF:   You have persistent vomiting.   You do not feel better after a few days.  You have frequent alcohol intoxication. Your health care provider can help determine if you should see a substance use treatment counselor. SEEK IMMEDIATE MEDICAL CARE IF:   You become shaky or tremble when you try to stop drinking.   You shake uncontrollably (seizure).   You throw up (vomit) blood. This may be bright red or may look like black coffee grounds.   You have blood in your stool. This may be bright red or  may appear as a black, tarry, bad smelling stool.   You become lightheaded or faint.  MAKE SURE YOU:   Understand these instructions.  Will watch your condition.  Will get help right away if you are not doing well or get worse. Document Released: 03/22/2005 Document Revised: 02/12/2013 Document Reviewed: 11/15/2012 [facility] Patient Information 2015 [place], [place]. This information is not intended to replace advice given to you by your health care provider. Make sure you discuss any questions you have with your health care provider.  Alcohol Use Disorder Alcohol use disorder is a mental disorder. It is not a one-time incident of heavy drinking. Alcohol use disorder is the excessive and uncontrollable use of alcohol over time that leads to problems with functioning in one or more areas of daily living. People with this disorder risk harming themselves and others when they drink to excess. Alcohol use disorder also can cause other mental disorders, such as mood and anxiety disorders, and serious physical problems. People with alcohol use disorder often misuse other drugs.  Alcohol use disorder is common and widespread. Some people with this disorder drink alcohol to cope with or escape from negative life events. Others drink to relieve chronic pain or symptoms of mental illness. People with a family history of alcohol use disorder are at higher risk of losing control and using alcohol to excess.  SYMPTOMS  Signs and symptoms of alcohol use disorder may include the following:   Consumption ofalcohol inlarger amounts or over a longer period of time than intended.  Multiple unsuccessful attempts to cutdown or control alcohol use.   A great deal of time spent obtaining alcohol, using alcohol, or recovering from the effects of alcohol (hangover).  A strong desire or urge to use alcohol (cravings).   Continued use of alcohol despite problems at work, school, or home because of alcohol use.    Continued use of alcohol despite problems in relationships because of alcohol use.  Continued use of alcohol in situations when it is physically hazardous, such as driving a car.  Continued use of alcohol despite awareness of a physical or psychological problem that is likely related to alcohol use. Physical problems related to alcohol use can involve the brain, heart, liver, stomach, and intestines. Psychological problems related to alcohol use include intoxication, depression, anxiety, psychosis, delirium, and dementia.   The need for increased amounts of alcohol to achieve the same desired effect, or a decreased effect from the consumption of the same amount of alcohol (tolerance).  Withdrawal symptoms upon reducing or stopping alcohol use, or alcohol use to reduce or avoid withdrawal symptoms. Withdrawal symptoms include:  Racing heart.  Hand tremor.  Difficulty sleeping.  Nausea.  Vomiting.  Hallucinations.  Restlessness.  Seizures. DIAGNOSIS Alcohol use disorder is diagnosed through an assessment by your health care provider. Your health care provider may start by asking three or four questions to screen for excessive or problematic alcohol use. To confirm a diagnosis of alcohol use disorder, at least two symptoms must be present within a  74-month period. The severity of alcohol use disorder depends on the number of symptoms:  Mild--two or three.  Moderate--four or five.  Severe--six or more. Your health care provider may perform a physical exam or use results from lab tests to see if you have physical problems resulting from alcohol use. Your health care provider may refer you to a mental health professional for evaluation. TREATMENT  Some people with alcohol use disorder are able to reduce their alcohol use to low-risk levels. Some people with alcohol use disorder need to quit drinking alcohol. When necessary, mental health professionals with specialized training in  substance use treatment can help. Your health care provider can help you decide how severe your alcohol use disorder is and what type of treatment you need. The following forms of treatment are available:   Detoxification. Detoxification involves the use of prescription medicines to prevent alcohol withdrawal symptoms in the first week after quitting. This is important for people with a history of symptoms of withdrawal and for heavy drinkers who are likely to have withdrawal symptoms. Alcohol withdrawal can be dangerous and, in severe cases, cause death. Detoxification is usually provided in a hospital or in-patient substance use treatment facility.  Counseling or talk therapy. Talk therapy is provided by substance use treatment counselors. It addresses the reasons people use alcohol and ways to keep them from drinking again. The goals of talk therapy are to help people with alcohol use disorder find healthy activities and ways to cope with life stress, to identify and avoid triggers for alcohol use, and to handle cravings, which can cause relapse.  Medicines.Different medicines can help treat alcohol use disorder through the following actions:  Decrease alcohol cravings.  Decrease the positive reward response felt from alcohol use.  Produce an uncomfortable physical reaction when alcohol is used (aversion therapy).  Support groups. Support groups are run by people who have quit drinking. They provide emotional support, advice, and guidance. These forms of treatment are often combined. Some people with alcohol use disorder benefit from intensive combination treatment provided by specialized substance use treatment centers. Both inpatient and outpatient treatment programs are available. Document Released: 07/20/2004 Document Revised: 10/27/2013 Document Reviewed: 09/19/2012 [facility] Patient Information 2015 [place], [place]. This information is not intended to replace advice given to you by your  health care provider. Make sure you discuss any questions you have with your health care provider.  Alcohol Withdrawal Alcohol withdrawal happens when you normally drink alcohol a lot and suddenly stop drinking. Alcohol withdrawal symptoms can be mild to very bad. Mild withdrawal symptoms can include feeling sick to your stomach (nauseous), headache, or feeling irritable. Bad withdrawal symptoms can include shakiness, being very nervous (anxious), and not thinking clearly.  HOME CARE  Join an alcohol support group.  Stay away from people or situations that make you want to drink.  Eat a healthy diet. Eat a lot of fresh fruits, vegetables, and lean meats. GET HELP RIGHT AWAY IF:   You become confused. You start to see and hear things that are not really there.  You feel your heart beating very fast.  You throw up (vomit) blood or cannot stop throwing up. This may be bright red or look like black coffee grounds.  You have blood in your poop (stool). This may be bright red, maroon colored, or black and tarry.  You are lightheaded or pass out (faint).  You develop a fever. MAKE SURE YOU:   Understand these instructions.  Will watch your  condition.  Will get help right away if you are not doing well or get worse. Document Released: 11/29/2007 Document Revised: 09/04/2011 Document Reviewed: 11/29/2007 [facility] Patient Information 2015 [place], [place]. This information is not intended to replace advice given to you by your health care provider. Make sure you discuss any questions you have with your health care provider.  Alcohol and Nutrition Nutrition serves two purposes. It provides energy. It also maintains body structure and function. Food supplies energy. It also provides the building blocks needed to replace worn or damaged cells. Alcoholics often eat poorly. This limits their supply of essential nutrients. This affects energy supply and structure maintenance. Alcohol also affects  the body's nutrients in:  Digestion.  Storage.  Using and getting rid of waste products. IMPAIRMENT OF NUTRIENT DIGESTION AND UTILIZATION   Once ingested, food must be broken down into small components (digested). Then it is available for energy. It helps maintain body structure and function. Digestion begins in the mouth. It continues in the stomach and intestines, with help from the pancreas. The nutrients from digested food are absorbed from the intestines into the blood. Then they are carried to the liver. The liver prepares nutrients for:  Immediate use.  Storage and future use.  Alcohol inhibits the breakdown of nutrients into usable molecules.  It decreases secretion of digestive enzymes from the pancreas.  Alcohol impairs nutrient absorption by damaging the cells lining the stomach and intestines.  It also interferes with moving some nutrients into the blood.  In addition, nutritional deficiencies themselves may lead to further absorption problems.  For example, folate deficiency changes the cells that line the small intestine. This impairs how water is absorbed. It also affects absorbed nutrients. These include glucose, sodium, and additional folate.  Even if nutrients are digested and absorbed, alcohol can prevent them from being fully used. It changes their transport, storage, and excretion. Impaired utilization of nutrients by alcoholics is indicated by:  Decreased liver stores of vitamins, such as vitamin A.  Increased excretion of nutrients such as fat. ALCOHOL AND ENERGY SUPPLY   Three basic nutritional components found in food are:  Carbohydrates.  Proteins.  Fats.  These are used as energy. Some alcoholics take in as much as 50% of their total daily calories from alcohol. They often neglect important foods.  Even when enough food is eaten, alcohol can impair the ways the body controls blood sugar (glucose) levels. It may either increase or decrease blood  sugar.  In non-diabetic alcoholics, increased blood sugar (hyperglycemia) is caused by poor insulin secretion. It is usually temporary.  Decreased blood sugar (hypoglycemia) can cause serious injury even if this condition is short-lived. Low blood sugar can happen when a fasting or malnourished person drinks alcohol. When there is no food to supply energy, stored sugar is used up. The products of alcohol inhibit forming glucose from other compounds such as amino acids. As a result, alcohol causes the brain and other body tissue to lack glucose. It is needed for energy and function.  Alcohol is an energy source. But how the body processes and uses the energy from alcohol is complex. Also, when alcohol is substituted for carbohydrates, subjects tend to lose weight. This indicates that they get less energy from alcohol than from food. ALCOHOL - MAINTAINING CELL STRUCTURE AND FUNCTION  Structure Cells are made mostly of protein. So an adequate protein diet is important for maintaining cell structure. This is especially true if cells are being damaged. Research indicates  that alcohol affects protein nutrition by causing impaired:  Digestion of proteins to amino acids.  Processing of amino acids by the small intestine and liver.  Synthesis of proteins from amino acids.  Protein secretion by the liver. Function Nutrients are essential for the body to function well. They provide the tools that the body needs to work well:   Proteins.  Vitamins.  Minerals. Alcohol can disrupt body function. It may cause nutrient deficiencies. And it may interfere with the way nutrients are processed. Vitamins  Vitamins are essential to maintain growth and normal metabolism. They regulate many of the body`s processes. Chronic heavy drinking causes deficiencies in many vitamins. This is caused by eating less. And, in some cases, vitamins may be poorly absorbed. For example, alcohol inhibits fat absorption. It  impairs how the vitamins A, E, and D are normally absorbed along with dietary fats. Not enough vitamin A may cause night blindness. Not enough vitamin D may cause softening of the bones.  Some alcoholics lack vitamins A, C, D, E, K, and the B vitamins. These are all involved in wound healing and cell maintenance. In particular, because vitamin K is necessary for blood clotting, lacking that vitamin can cause delayed clotting. The result is excess bleeding. Lacking other vitamins involved in brain function may cause severe neurological damage. Minerals Deficiencies of minerals such as calcium, magnesium, iron, and zinc are common in alcoholics. The alcohol itself does not seem to affect how these minerals are absorbed. Rather, they seem to occur secondary to other alcohol-related problems, such as:  Less calcium absorbed.  Not enough magnesium.  More urinary excretion.  Vomiting.  Diarrhea.  Not enough iron due to gastrointestinal bleeding.  Not enough zinc or losses related to other nutrient deficiencies.  Mineral deficiencies can cause a variety of medical consequences. These range from calcium-related bone disease to zinc-related night blindness and skin lesions. ALCOHOL, MALNUTRITION, AND MEDICAL COMPLICATIONS  Liver Disease   Alcoholic liver damage is caused primarily by alcohol itself. But poor nutrition may increase the risk of alcohol-related liver damage. For example, nutrients normally found in the liver are known to be affected by drinking alcohol. These include carotenoids, which are the major sources of vitamin A, and vitamin E compounds. Decreases in such nutrients may play some role in alcohol-related liver damage. Pancreatitis  Research suggests that malnutrition may increase the risk of developing alcoholic pancreatitis. Research suggests that a diet lacking in protein may increase alcohol's damaging effect on the pancreas. Brain  Nutritional deficiencies may have severe  effects on brain function. These may be permanent. Specifically, thiamine deficiencies are often seen in alcoholics. They can cause severe neurological problems. These include:  Impaired movement.  Memory loss seen in Wernicke-Korsakoff syndrome. Pregnancy  Alcohol has toxic effects on fetal development. It causes alcohol-related birth defects. They include fetal alcohol syndrome. Alcohol itself is toxic to the fetus. Also, the nutritional deficiency can affect how the fetus develops. That may compound the risk of developmental damage.  Nutritional needs during pregnancy are 10% to 30% greater than normal. Food intake can increase by as much as 140% to cover the needs of both mother and fetus. An alcoholic mother`s nutritional problems may adversely affect the nutrition of the fetus. And alcohol itself can also restrict nutrition flow to the fetus. NUTRITIONAL STATUS OF ALCOHOLICS  Techniques for assessing nutritional status include:  Taking body measurements to estimate fat reserves. They include:  Weight.  Height.  Mass.  Skin fold thickness.  Performing blood analysis to provide measurements of circulating:  Proteins.  Vitamins.  Minerals.  These techniques tend to be imprecise. For many nutrients, there is no clear "cut-off" point that would allow an accurate definition of deficiency. So assessing the nutritional status of alcoholics is limited by these techniques. Dietary status may provide information about the risk of developing nutritional problems. Dietary status is assessed by:  Taking patients' dietary histories.  Evaluating the amount and types of food they are eating.  It is difficult to determine what exact amount of alcohol begins to have damaging effects on nutrition. In general, moderate drinkers have 2 drinks or less per day. They seem to be at little risk for nutritional problems. Various medical disorders begin to appear at greater levels.  Research  indicates that the majority of even the heaviest drinkers have few obvious nutritional deficiencies. Many alcoholics who are hospitalized for medical complications of their disease do have severe malnutrition. Alcoholics tend to eat poorly. Often they eat less than the amounts of food necessary to provide enough:  Carbohydrates.  Protein.  Fat.  Vitamins A and C.  B vitamins.  Minerals like calcium and iron. Of major concern is alcohol's effect on digesting food and use of nutrients. It may shift a mildly malnourished person toward severe malnutrition. Document Released: 04/06/2005 Document Revised: 09/04/2011 Document Reviewed: 09/20/2005 [facility] Patient Information 2015 [place]. This information is not intended to replace advice given to you by your health care provider. Make sure you discuss any questions you have with your health care provider.  Alcohol Intoxication Alcohol intoxication occurs when you drink enough alcohol that it affects your ability to function. It can be mild or very severe. Drinking a lot of alcohol in a short time is called binge drinking. This can be very harmful. Drinking alcohol can also be more dangerous if you are taking medicines or other drugs. Some of the effects caused by alcohol may include:  Loss of coordination.  Changes in mood and behavior.  Unclear thinking.  Trouble talking (slurred speech).  Throwing up (vomiting).  Confusion.  Slowed breathing.  Twitching and shaking (seizures).  Loss of consciousness. HOME CARE  Do not drive after drinking alcohol.  Drink enough water and fluids to keep your pee (urine) clear or pale yellow. Avoid caffeine.  Only take medicine as told by your doctor. GET HELP IF:  You throw up (vomit) many times.  You do not feel better after a few days.  You frequently have alcohol intoxication. Your doctor can help decide if you should see a substance use treatment counselor. GET HELP RIGHT  AWAY IF:  You become shaky when you stop drinking.  You have twitching and shaking.  You throw up blood. It may look bright red or like coffee grounds.  You notice blood in your poop (bowel movements).  You become lightheaded or pass out (faint). MAKE SURE YOU:   Understand these instructions.  Will watch your condition.  Will get help right away if you are not doing well or get worse. Document Released: 11/29/2007 Document Revised: 02/12/2013 Document Reviewed: 11/15/2012 [facility] Patient Information 2015 [place] [place]. This information is not intended to replace advice given to you by your health care provider. Make sure you discuss any questions you have with your health care provider.

## 2014-09-13 NOTE — ED Notes (Signed)
Patient feels unsteady on his feet due to Ativan.  Feels like he might fall if he walks.  Does not have transportation home.  Reports when he feels better he would like a bus ticket.

## 2014-09-13 NOTE — ED Notes (Signed)
Psychiatrist in room making rounds.

## 2014-09-13 NOTE — ED Notes (Signed)
[facility]. Pt reports drinking 5th of liquor today.

## 2014-09-13 NOTE — [facility] Suicide Risk Assessment (Signed)
[facility]   Demographic Factors:  Male, Caucasian, Low socioeconomic status and Unemployed  Total Time spent with patient: 45 minutes  Musculoskeletal: Strength & Muscle Tone: within normal limits Gait & Station: normal Patient leans: N/A  Psychiatric Specialty Exam: Physical Exam  ROS  Blood pressure 138/82, pulse 102, temperature 98.8 F (37.1 C), temperature source Oral, resp. rate 16, SpO2 94 %.There is no weight on file to calculate BMI.  General Appearance: Fairly Groomed  Patent attorneyye Contact::  Fair  Speech:  Normal Rate409  Volume:  Normal  Mood:  Depressed  Affect:  Congruent and Depressed  Thought Process:  Coherent and Goal Directed  Orientation:  Full (Time, Place, and Person)  Thought Content:  Negative  Suicidal Thoughts:  No  Homicidal Thoughts:  No  Memory:  Negative  Judgement:  Fair  Insight:  Fair  Psychomotor Activity:  Normal  Concentration:  Fair  Recall:  F[employer]ervFair  Fund of Knowledge:Fair  Language: Fair  Akathisia:  Negative  Handed:  Right  AIMS (if indicated):     Assets:  Communication Skills Desire for Improvement Physical Health Resilience  Sleep:     Cognition: WNL  ADL's:  Intact      Has this patient used any form of tobacco in the last 30 days? (Cigarettes, Smokeless Tobacco, Cigars, and/or Pipes) N/A  Mental Status Per Nursing Assessment::   On Admission:     Current Mental Status by Physician: Pt denies current SI/HI/AVH. Pt has been to the ED almost daily, and [facility]. Pt has not followed up with outpatient mental health locally thus far.  Loss Factors: Financial problems/change in socioeconomic status  Historical Factors: Prior suicide attempts  Risk Reduction Factors:   Sense of responsibility to family, Religious beliefs about death and Positive coping skills or problem solving skills  Continued Clinical Symptoms:  Depression:   Comorbid alcohol  abuse/dependence Alcohol/Substance Abuse/Dependencies  Cognitive Features That Contribute To Risk:  Thought constriction (tunnel vision)    Suicide Risk:  Minimal: No identifiable suicidal ideation.  Patients presenting with no risk factors but with morbid ruminations; may be classified as minimal risk based on the severity of the depressive symptoms  Principal Problem: Alcohol dependence with uncomplicated withdrawal Discharge Diagnoses:  Patient Active Problem List   Diagnosis Date Noted  . Alcohol dependence with uncomplicated withdrawal [F10.230] 08/26/2014      Plan Of Care/Follow-up recommendations:  Activity:  as tolerated Diet:  regular Tests:  per PCP Other:  f/u with outpatient mental health and substance abuse counseling  Is patient on multiple antipsychotic therapies at discharge:  No   Has Patient had three or more failed trials of antipsychotic monotherapy by history:  No  Recommended Plan for Multiple Antipsychotic Therapies: NA    Shateria Paternostro 09/13/2014, 12:24 PM

## 2014-09-13 NOTE — Consult Note (Signed)
[facility] Consult   Reason for Consult:  Alcohol detox Referring Physician:  EDP Patient Identification: Jordan Davidson MRN:  [id number] Principal Diagnosis: Alcohol dependence with uncomplicated withdrawal Diagnosis:   Patient Active Problem List   Diagnosis Date Noted  . Alcohol dependence with uncomplicated withdrawal [W58.099] 08/26/2014    Total Time spent with patient: 45 minutes  Subjective:   Jordan Davidson is a 44 y.o. male patient admitted with alcohol detox. Pt interviewed with NP. Chart reviewed. Case discussed with ED treatment team. Pt reports being homeless for 3 weeks. Pt reports his mood is depressed, but he has not been taking his antidepressant medication (which was prescribed by a psychiatrist in [facility]). Pt denies current SI/HI/AVH. Pt reports occasionally hearing music. Pt reports that he is unable to stop drinking, and he is drinking 1/5-1/2 gallon per day. Pt is requesting detox, but he was just discharged from [facility] 1 month ago (so [facility] will not take him back for another 2 months). Pt is not allowed to go to [facility] either, since he was there within the last 1 month. Pt reports not having any friends/family that he can live with.   Per SW assessment: "Jordan Davidson is an 43 y.o. male, single, Caucasian who presents to [facility] via EMS after calling 911 from a [facility] and stating he wants treatment for alcohol abuse. Pt has been in the ED on a near daily basis for weeks in varying degrees of alcohol intoxication. He states he is seeking treatment at this time because he is "sick and tired of how my life is." He reports drinking between one fifth to half a gallon of liquor daily and has been doing so for 30 years. He says he drank approxiamtely one fifth of liquor today. He states when he stops drinking he experiences nausea, vomiting, diarrhea, tremors, blackouts and seizures. He report his last seizure was approximately one month ago. He states his longest  period of sobriety was approximately six months but cannot remember how long ago. He states he has been in treatment numerous times but cannot say how where or when was his last treatment. He reports his father had a problem with alcohol.  Pt reports he has felt depressed recently with symptoms including crying spells, decreased appetite, social withdrawal, loss of interest in usual pleasures, irritability and feelings of hopelessness. He denies current suicidal ideation. He has a history of two previous suicide attempts in the distant past. He denies current homicidal ideation or history of violence. He denies current auditory or visual hallucinations. Pt's chart indicates he may have a mood disorder diagnosis. Pt states he has medications but doesn't know what they are and doesn't take the medications consistently.  Pt is currently homeless. He states he has no family or friends who are supportive. He denies receiving disability or having any source of income. He denies any current legal charges or court dates."  HPI:  See above. HPI Elements:   Location:  daily alcohol. Quality:  severe. Severity:  severe. Timing:  chronic. Duration:  chronic. Context:  homeless.  Past Medical History:  Past Medical History  Diagnosis Date  . Alcohol abuse   . Depression   . Bipolar 1 disorder, depressed     Past Surgical History  Procedure Laterality Date  . No past surgeries     Family History:  Family History  Problem Relation Age of Onset  . Hypertension Mother   . Cancer Father   . Heart  failure Father    Social History:  History  Alcohol Use  . Yes    Comment: daily - "whatever I can get" - 1/5 plus daily     History  Drug Use No    History   Social History  . Marital Status: Legally Separated    Spouse Name: N/A  . Number of Children: N/A  . Years of Education: N/A   Social History Main Topics  . Smoking status: Current Every Day Smoker  . Smokeless tobacco: Not on file   . Alcohol Use: Yes     Comment: daily - "whatever I can get" - 1/5 plus daily  . Drug Use: No  . Sexual Activity: Not on file   Other Topics Concern  . None   Social History Narrative   Additional Social History:    Pain Medications: SEE PTA Prescriptions: Pt reports he misses doses of his medications Over the Counter: SEE PTA History of alcohol / drug use?: Yes Longest period of sobriety (when/how long): Six months Negative Consequences of Use: Financial, Scientist, research (physical sciences), Personal relationships, Work / School Withdrawal Symptoms: Agitation, DTs, Tremors, Irritability, Sweats, Nausea / Vomiting, Blackouts, Diarrhea, Seizures Onset of Seizures: UNknown Date of most recent seizure: Approximately one month ago Name of Substance 1: Alcohol 1 - Age of First Use: Adolescent 1 - Amount (size/oz): One fifth to one half gallon liquor 1 - Frequency: Daily 1 - Duration: Ongoing for 30 years 1 - Last Use / Amount: 09/12/14, unknown                   Allergies:  No Known Allergies  Labs:  Results for orders placed or performed during the hospital encounter of 09/13/14 (from the past 48 hour(s))  CBC with Differential/Platelet     Status: Abnormal   Collection Time: 09/13/14  5:28 AM  Result Value Ref Range   WBC 5.0 4.0 - 10.5 K/uL   RBC 4.09 (L) 4.22 - 5.81 MIL/uL   Hemoglobin 12.9 (L) 13.0 - 17.0 g/dL   HCT 37.6 (L) 39.0 - 52.0 %   MCV 91.9 78.0 - 100.0 fL   MCH 31.5 26.0 - 34.0 pg   MCHC 34.3 30.0 - 36.0 g/dL   RDW 13.7 11.5 - 15.5 %   Platelets 33 (L) 150 - 400 K/uL    Comment: CONSISTENT WITH PREVIOUS RESULT   Neutrophils Relative % 51 43 - 77 %   Neutro Abs 2.6 1.7 - 7.7 K/uL   Lymphocytes Relative 38 12 - 46 %   Lymphs Abs 1.9 0.7 - 4.0 K/uL   Monocytes Relative 10 3 - 12 %   Monocytes Absolute 0.5 0.1 - 1.0 K/uL   Eosinophils Relative 1 0 - 5 %   Eosinophils Absolute 0.0 0.0 - 0.7 K/uL   Basophils Relative 1 0 - 1 %   Basophils Absolute 0.0 0.0 - 0.1 K/uL  Ethanol      Status: Abnormal   Collection Time: 09/13/14  5:28 AM  Result Value Ref Range   Alcohol, Ethyl (B) 281 (H) 0 - 9 mg/dL    Comment:        LOWEST DETECTABLE LIMIT FOR SERUM ALCOHOL IS 11 mg/dL FOR MEDICAL PURPOSES ONLY   Acetaminophen level     Status: Abnormal   Collection Time: 09/13/14  5:28 AM  Result Value Ref Range   Acetaminophen (Tylenol), Serum <10.0 (L) 10 - 30 ug/mL    Comment:  THERAPEUTIC CONCENTRATIONS VARY SIGNIFICANTLY. A RANGE OF 10-30 ug/mL MAY BE AN EFFECTIVE CONCENTRATION FOR MANY PATIENTS. HOWEVER, SOME ARE BEST TREATED AT CONCENTRATIONS OUTSIDE THIS RANGE. ACETAMINOPHEN CONCENTRATIONS >150 ug/mL AT 4 HOURS AFTER INGESTION AND >50 ug/mL AT 12 HOURS AFTER INGESTION ARE OFTEN ASSOCIATED WITH TOXIC REACTIONS.   Salicylate level     Status: None   Collection Time: 09/13/14  5:28 AM  Result Value Ref Range   Salicylate Lvl <7.6 2.8 - 20.0 mg/dL  Comprehensive metabolic panel     Status: Abnormal   Collection Time: 09/13/14  5:28 AM  Result Value Ref Range   Sodium 142 135 - 145 mmol/L   Potassium 3.0 (L) 3.5 - 5.1 mmol/L   Chloride 100 96 - 112 mmol/L   CO2 29 19 - 32 mmol/L   Glucose, Bld 132 (H) 70 - 99 mg/dL   BUN 10 6 - 23 mg/dL   Creatinine, Ser 0.81 0.50 - 1.35 mg/dL   Calcium 8.8 8.4 - 10.5 mg/dL   Total Protein 6.5 6.0 - 8.3 g/dL   Albumin 4.0 3.5 - 5.2 g/dL   AST 130 (H) 0 - 37 U/L   ALT 59 (H) 0 - 53 U/L   Alkaline Phosphatase 88 39 - 117 U/L   Total Bilirubin 0.4 0.3 - 1.2 mg/dL   GFR calc non Af Amer >90 >90 mL/min   GFR calc Af Amer >90 >90 mL/min    Comment: (NOTE) The eGFR has been calculated using the CKD EPI equation. This calculation has not been validated in all clinical situations. eGFR's persistently <90 mL/min signify possible Chronic Kidney Disease.    Anion gap 13 5 - 15  Urinalysis, Routine w reflex microscopic     Status: Abnormal   Collection Time: 09/13/14  8:27 AM  Result Value Ref Range   Color, Urine  AMBER (A) YELLOW    Comment: BIOCHEMICALS MAY BE AFFECTED BY COLOR   APPearance CLOUDY (A) CLEAR   Specific Gravity, Urine 1.030 1.005 - 1.030   pH 7.0 5.0 - 8.0   Glucose, UA NEGATIVE NEGATIVE mg/dL   Hgb urine dipstick TRACE (A) NEGATIVE   Bilirubin Urine SMALL (A) NEGATIVE   Ketones, ur NEGATIVE NEGATIVE mg/dL   Protein, ur 30 (A) NEGATIVE mg/dL   Urobilinogen, UA 1.0 0.0 - 1.0 mg/dL   Nitrite NEGATIVE NEGATIVE   Leukocytes, UA NEGATIVE NEGATIVE  Urine microscopic-add on     Status: Abnormal   Collection Time: 09/13/14  8:27 AM  Result Value Ref Range   RBC / HPF 0-2 <3 RBC/hpf   Crystals CA OXALATE CRYSTALS (A) NEGATIVE   Urine-Other MUCOUS PRESENT     Comment: SPERM PRESENT  Drug screen panel, emergency     Status: None   Collection Time: 09/13/14  8:28 AM  Result Value Ref Range   Opiates NONE DETECTED NONE DETECTED   Cocaine NONE DETECTED NONE DETECTED   Benzodiazepines NONE DETECTED NONE DETECTED   Amphetamines NONE DETECTED NONE DETECTED   Tetrahydrocannabinol NONE DETECTED NONE DETECTED   Barbiturates NONE DETECTED NONE DETECTED    Comment:        DRUG SCREEN FOR MEDICAL PURPOSES ONLY.  IF CONFIRMATION IS NEEDED FOR ANY PURPOSE, NOTIFY LAB WITHIN 5 DAYS.        LOWEST DETECTABLE LIMITS FOR URINE DRUG SCREEN Drug Class       Cutoff (ng/mL) Amphetamine      1000 Barbiturate      200 Benzodiazepine   195 Tricyclics  300 Opiates          300 Cocaine          300 THC              50     Vitals: Blood pressure 138/82, pulse 102, temperature 98.8 F (37.1 C), temperature source Oral, resp. rate 16, SpO2 94 %.  Risk to Self: Suicidal Ideation: No Suicidal Intent: No Is patient at risk for suicide?: No Suicidal Plan?: No Access to Means: No What has been your use of drugs/alcohol within the last 12 months?: Pt has been drinking heavily for 30 years How many times?: 2 (2004, 2009 overdosed on pills "I was just tired of living")) Other Self Harm Risks:  None Triggers for Past Attempts: Other (Comment) ("tired of living") Intentional Self Injurious Behavior: None Risk to Others: Homicidal Ideation: No Thoughts of Harm to Others: No Current Homicidal Intent: No Current Homicidal Plan: No Access to Homicidal Means: No Identified Victim: None History of harm to others?: No Assessment of Violence: None Noted Violent Behavior Description: None Does patient have access to weapons?: No Criminal Charges Pending?: No Describe Pending Criminal Charges: Pt denies current charges or court date Does patient have a court date: No Prior Inpatient Therapy: Prior Inpatient Therapy: Yes Prior Therapy Dates: "many times" dates unknown Prior Therapy Facilty/Provider(s): [facility], [place], [facility], [facility], [facility], [facility], [facility], [facility] Reason for Treatment: SA, bipolar per pt Prior Outpatient Therapy: Prior Outpatient Therapy: Yes Prior Therapy Dates: reports attends AA, was prescribed medication at [facility], denies current providers Prior Therapy Facilty/Provider(s): [facility] Reason for Treatment: SA  Current Facility-Administered Medications  Medication Dose Route Frequency Provider Last Rate Last Dose  . acetaminophen (TYLENOL) tablet 650 mg  650 mg Oral Q4H PRN Alvina Chou, PA-C      . alum & mag hydroxide-simeth (MAALOX/MYLANTA) 200-200-20 MG/5ML suspension 30 mL  30 mL Oral PRN Alvina Chou, PA-C      . ibuprofen (ADVIL,MOTRIN) tablet 600 mg  600 mg Oral Q8H PRN Alvina Chou, PA-C      . LORazepam (ATIVAN) tablet 0-4 mg  0-4 mg Oral 4 times per day Alvina Chou, PA-C   2 mg at 09/13/14 1036   Followed by  . [START ON 09/15/2014] LORazepam (ATIVAN) tablet 0-4 mg  0-4 mg Oral Q12H Kaitlyn Szekalski, PA-C      . ondansetron (ZOFRAN) tablet 4 mg  4 mg Oral Q8H PRN Alvina Chou, PA-C       Current Outpatient Prescriptions  Medication Sig Dispense Refill  . FLUoxetine (PROZAC) 20 MG capsule Take 20 mg by mouth daily.     Marland Kitchen gabapentin (NEURONTIN) 300 MG capsule Take 300 mg by mouth 3 (three) times daily.    Marland Kitchen OLANZapine (ZYPREXA) 10 MG tablet Take 10 mg by mouth at bedtime.    . Soft Lens Products (RENU MULTIPLUS LUB/REWETTING) SOLN Place 1 drop into both eyes 4 (four) times daily as needed (dry eyes).    . traZODone (DESYREL) 100 MG tablet Take 100 mg by mouth at bedtime.    . ondansetron (ZOFRAN ODT) 4 MG disintegrating tablet Take 1 tablet (4 mg total) by mouth every 8 (eight) hours as needed for nausea or vomiting. (Patient not taking: Reported on 08/30/2014) 20 tablet 0    Musculoskeletal: Strength & Muscle Tone: within normal limits Gait & Station: normal Patient leans: N/A  Psychiatric Specialty Exam: Physical Exam  ROS  Blood pressure 138/82, pulse 102, temperature 98.8 F (37.1 C),  temperature source Oral, resp. rate 16, SpO2 94 %.There is no weight on file to calculate BMI.  General Appearance: Casual  Eye Contact::  Fair  Speech:  Normal Rate  Volume:  Normal  Mood:  Depressed  Affect:  Congruent and Depressed  Thought Process:  Coherent and Goal Directed  Orientation:  Full (Time, Place, and Person)  Thought Content:  Negative  Suicidal Thoughts:  No  Homicidal Thoughts:  No  Memory:  Immediate;   Fair Recent;   Fair Remote;   Fair  Judgement:  Intact  Insight:  Fair  Psychomotor Activity:  Normal  Concentration:  Fair  Recall:  [employer] of Knowledge:Fair  Language: Fair  Akathisia:  Negative  Handed:  Right  AIMS (if indicated):     Assets:  Communication Skills Desire for Improvement Physical Health Resilience  ADL's:  Intact  Cognition: WNL  Sleep:      Medical Decision Making: Established Problem, Stable/Improving (1), Review of Psycho-Social Stressors (1), Review and summation of old records (2) and Review of Medication Regimen & Side Effects (2)  Treatment Plan Summary: Plan will discharge pt to homeless shelter. Pt has been coming to ED almost daily requesting  detox/rehab, but he is not allowed to go back to [facility] or [facility] (since he was discharged from both places within the past 1 month). Pt appears to be in the contemplation/preparation stage of change. Will give list of outpatient referrals, since he has not followed up with outpatient mental health locally.  Plan:  No evidence of imminent risk to self or others at present.   Patient does not meet criteria for psychiatric inpatient admission. Supportive therapy provided about ongoing stressors. Discussed crisis plan, support from social network, calling 911, coming to the Emergency Department, and calling Suicide Hotline. Disposition: To homeless shelter.  Dereck Leep 09/13/2014 11:54 AM

## 2014-09-13 NOTE — ED Provider Notes (Signed)
CSN: [id number]     Arrival date & time 09/13/14  0114 History   First MD Initiated Contact with Patient 09/13/14 0255     Chief Complaint  Patient presents with  . Alcohol Intoxication     (Consider location/radiation/quality/duration/timing/severity/associated sxs/prior Treatment) HPI Comments: Patient is a 44 year old male with a past medical history of alcohol withdrawal who presents via EMS from [facility] requesting ETOH detox. Patient reports drinking a fifth to a half gallon of liquor per day. He denies any drug use. He denies SI/HI. No other associated symptoms.    Past Medical History  Diagnosis Date  . Alcohol abuse   . Depression   . Bipolar 1 disorder, depressed    Past Surgical History  Procedure Laterality Date  . No past surgeries     Family History  Problem Relation Age of Onset  . Hypertension Mother   . Cancer Father   . Heart failure Father    History  Substance Use Topics  . Smoking status: Current Every Day Smoker  . Smokeless tobacco: Not on file  . Alcohol Use: Yes     Comment: daily - "whatever I can get" - 1/5 plus daily    Review of Systems  Constitutional: Negative for fever, chills and fatigue.  HENT: Negative for trouble swallowing.   Eyes: Negative for visual disturbance.  Respiratory: Negative for shortness of breath.   Cardiovascular: Negative for chest pain and palpitations.  Gastrointestinal: Negative for nausea, vomiting, abdominal pain and diarrhea.  Genitourinary: Negative for dysuria and difficulty urinating.  Musculoskeletal: Negative for arthralgias and neck pain.  Skin: Negative for color change.  Neurological: Negative for dizziness and weakness.  Psychiatric/Behavioral: Negative for dysphoric mood.      Allergies  Review of patient's allergies indicates no known allergies.  Home Medications   Prior to Admission medications   Medication Sig Start Date End Date Taking? Authorizing Provider  FLUoxetine (PROZAC) 20 MG  capsule Take 20 mg by mouth daily.    Historical Provider, MD  gabapentin (NEURONTIN) 300 MG capsule Take 300 mg by mouth 3 (three) times daily.    Historical Provider, MD  OLANZapine (ZYPREXA) 10 MG tablet Take 10 mg by mouth at bedtime.    Historical Provider, MD  ondansetron (ZOFRAN ODT) 4 MG disintegrating tablet Take 1 tablet (4 mg total) by mouth every 8 (eight) hours as needed for nausea or vomiting. Patient not taking: Reported on 08/30/2014 08/25/14   Teressa Lower, NP  Soft Lens Products (RENU MULTIPLUS LUB/REWETTING) SOLN Place 1 drop into both eyes 4 (four) times daily as needed (dry eyes).    Historical Provider, MD  traZODone (DESYREL) 100 MG tablet Take 100 mg by mouth at bedtime.    Historical Provider, MD   BP 122/77 mmHg  Pulse 83  Temp(Src) 98.2 F (36.8 C) (Oral)  Resp 16  SpO2 98% Physical Exam  Constitutional: He appears well-developed and well-nourished. No distress.  Patient intoxicated.   HENT:  Head: Normocephalic and atraumatic.  Eyes: Conjunctivae and EOM are normal.  Neck: Normal range of motion.  Cardiovascular: Normal rate and regular rhythm.  Exam reveals no gallop and no friction rub.   No murmur heard. Pulmonary/Chest: Effort normal and breath sounds normal. He has no wheezes. He has no rales. He exhibits no tenderness.  Abdominal: Soft. There is no tenderness.  Musculoskeletal: Normal range of motion.  Neurological: He is alert.  Speech is goal-oriented. Moves limbs without ataxia.   Skin: Skin is  warm and dry.  Psychiatric: He has a normal mood and affect.  Nursing note and vitals reviewed.   ED Course  Procedures (including critical care time) Labs Review Labs Reviewed  CBC WITH DIFFERENTIAL/PLATELET - Abnormal; Notable for the following:    RBC 4.09 (*)    Hemoglobin 12.9 (*)    HCT 37.6 (*)    Platelets 33 (*)    All other components within normal limits  URINALYSIS, ROUTINE W REFLEX MICROSCOPIC - Abnormal; Notable for the following:     Color, Urine AMBER (*)    APPearance CLOUDY (*)    Hgb urine dipstick TRACE (*)    Bilirubin Urine SMALL (*)    Protein, ur 30 (*)    All other components within normal limits  ETHANOL - Abnormal; Notable for the following:    Alcohol, Ethyl (B) 281 (*)    All other components within normal limits  ACETAMINOPHEN LEVEL - Abnormal; Notable for the following:    Acetaminophen (Tylenol), Serum <10.0 (*)    All other components within normal limits  COMPREHENSIVE METABOLIC PANEL - Abnormal; Notable for the following:    Potassium 3.0 (*)    Glucose, Bld 132 (*)    AST 130 (*)    ALT 59 (*)    All other components within normal limits  URINE MICROSCOPIC-ADD ON - Abnormal; Notable for the following:    Crystals CA OXALATE CRYSTALS (*)    All other components within normal limits  URINE RAPID DRUG SCREEN (HOSP PERFORMED)  SALICYLATE LEVEL    Imaging Review No results found.   EKG Interpretation None      MDM   Final diagnoses:  Alcohol abuse    3:51 AM Labs pending. Patient requesting detox.    Emilia BeckKaitlyn Zandrea Kenealy, PA-C 09/15/14 0750  Dione Boozeavid Glick, MD 09/16/14 [phone]

## 2014-09-14 ENCOUNTER — Encounter (HOSPITAL_COMMUNITY): Payer: Self-pay | Admitting: Emergency Medicine

## 2014-09-14 ENCOUNTER — Emergency Department (HOSPITAL_COMMUNITY)
Admission: EM | Admit: 2014-09-14 | Discharge: 2014-09-14 | Disposition: A | Discharge status: Home / Self Care | Payer: Self-pay | Attending: Emergency Medicine | Admitting: Emergency Medicine

## 2014-09-14 DIAGNOSIS — Z720 Tobacco use: Secondary | ICD-10-CM | POA: Insufficient documentation

## 2014-09-14 DIAGNOSIS — Z79899 Other long term (current) drug therapy: Secondary | ICD-10-CM | POA: Insufficient documentation

## 2014-09-14 DIAGNOSIS — F319 Bipolar disorder, unspecified: Secondary | ICD-10-CM | POA: Insufficient documentation

## 2014-09-14 DIAGNOSIS — F10230 Alcohol dependence with withdrawal, uncomplicated: Secondary | ICD-10-CM | POA: Insufficient documentation

## 2014-09-14 MED ORDER — FOLIC ACID 1 MG PO TABS
1.0000 mg | ORAL_TABLET | Freq: Once | ORAL | Status: AC
  Administered 2014-09-14: 1 mg via ORAL
  Filled 2014-09-14: qty 1

## 2014-09-14 MED ORDER — POTASSIUM CHLORIDE CRYS ER 20 MEQ PO TBCR
40.0000 meq | EXTENDED_RELEASE_TABLET | Freq: Once | ORAL | Status: AC
  Administered 2014-09-14: 40 meq via ORAL
  Filled 2014-09-14: qty 2

## 2014-09-14 MED ORDER — VITAMIN B-1 100 MG PO TABS
100.0000 mg | ORAL_TABLET | Freq: Once | ORAL | Status: AC
  Administered 2014-09-14: 100 mg via ORAL
  Filled 2014-09-14: qty 1

## 2014-09-14 MED ORDER — ADULT MULTIVITAMIN W/MINERALS CH
1.0000 | ORAL_TABLET | Freq: Once | ORAL | Status: AC
  Administered 2014-09-14: 1 via ORAL
  Filled 2014-09-14: qty 1

## 2014-09-14 NOTE — ED Provider Notes (Signed)
CSN: [id number]     Arrival date & time 09/14/14  7829 History   First MD Initiated Contact with Patient 09/14/14 [phone]     Chief Complaint  Patient presents with  . Alcohol Intoxication     (Consider location/radiation/quality/duration/timing/severity/associated sxs/prior Treatment) HPI Comments: EMS called by a bystander who saw patient intoxicated this morning.  Patient states that he is not sure who called EMS or why.  He states he is here because "he is sick."  He cannot elaborate besides stating that he is nauseated and hungry.  He states he's been drinking for a long time, he has been told that we no longer do alcohol detox.  He is not expressing SI or HI.  He denies seeking care recently at [facility].   Patient is a 44 y.o. male presenting with intoxication.  Alcohol Intoxication Pertinent negatives include no chest pain, no abdominal pain, no headaches and no shortness of breath.    Past Medical History  Diagnosis Date  . Alcohol abuse   . Depression   . Bipolar 1 disorder, depressed    Past Surgical History  Procedure Laterality Date  . No past surgeries     Family History  Problem Relation Age of Onset  . Hypertension Mother   . Cancer Father   . Heart failure Father    History  Substance Use Topics  . Smoking status: Current Every Day Smoker  . Smokeless tobacco: Not on file  . Alcohol Use: Yes     Comment: daily - "whatever I can get" - 1/5 plus daily    Review of Systems  Constitutional: Negative for fever, activity change, appetite change and fatigue.  HENT: Negative for congestion, facial swelling, rhinorrhea and trouble swallowing.   Eyes: Negative for photophobia and pain.  Respiratory: Negative for cough, chest tightness and shortness of breath.   Cardiovascular: Negative for chest pain and leg swelling.  Gastrointestinal: Positive for nausea. Negative for vomiting, abdominal pain, diarrhea and constipation.  Endocrine: Negative for polydipsia and  polyuria.  Genitourinary: Negative for dysuria, urgency, decreased urine volume and difficulty urinating.  Musculoskeletal: Negative for back pain and gait problem.  Skin: Negative for color change, rash and wound.  Allergic/Immunologic: Negative for immunocompromised state.  Neurological: Negative for dizziness, facial asymmetry, speech difficulty, weakness, numbness and headaches.  Psychiatric/Behavioral: Negative for confusion, decreased concentration and agitation.      Allergies  Review of patient's allergies indicates no known allergies.  Home Medications   Prior to Admission medications   Medication Sig Start Date End Date Taking? Authorizing Provider  FLUoxetine (PROZAC) 20 MG capsule Take 20 mg by mouth daily.   Yes Historical Provider, MD  gabapentin (NEURONTIN) 300 MG capsule Take 300 mg by mouth 3 (three) times daily.   Yes Historical Provider, MD  OLANZapine (ZYPREXA) 10 MG tablet Take 10 mg by mouth at bedtime.   Yes Historical Provider, MD  Soft Lens Products (RENU MULTIPLUS LUB/REWETTING) SOLN Place 1 drop into both eyes 4 (four) times daily as needed (dry eyes).   Yes Historical Provider, MD  traZODone (DESYREL) 100 MG tablet Take 100 mg by mouth at bedtime.   Yes Historical Provider, MD   BP 121/82 mmHg  Pulse 84  Temp(Src) 97.8 F (36.6 C) (Oral)  Resp 18  SpO2 97% Physical Exam  Constitutional: He is oriented to person, place, and time. He appears well-developed and well-nourished. No distress.  Disheveled, smells of ETOH.   HENT:  Head: Normocephalic and atraumatic.  Mouth/Throat: No oropharyngeal exudate.  Eyes: Pupils are equal, round, and reactive to light.  Neck: Normal range of motion.  Cardiovascular: Normal rate, regular rhythm and normal heart sounds.  Exam reveals no gallop and no friction rub.   No murmur heard. Pulmonary/Chest: Effort normal and breath sounds normal. No respiratory distress. He has no wheezes. He has no rales.  Abdominal: Soft.  Bowel sounds are normal.  Musculoskeletal: Normal range of motion.  Neurological: He is alert and oriented to person, place, and time. Coordination normal.  Skin: Skin is warm and dry.  Psychiatric: He has a normal mood and affect.    ED Course  Procedures (including critical care time) Labs Review Labs Reviewed - No data to display  Imaging Review No results found.   EKG Interpretation None      MDM   Final diagnoses:  Alcohol dependence with uncomplicated withdrawal   Pt is a 44 y.o. male with Pmhx as above who presents with alcohol abuse.  EMS called by a bystander who saw patient intoxicated this morning.  Patient states that he is not sure who called EMS or why.  He states he is here because "he is sick."  He cannot elaborat besides his stated that he is nauseated and hungry.  He states he's been drinking for a long time, he has been told that we no longer do alcohol detox.  He is not expressing SI or HI.  He's been resting calmly in the department and is ambulated to and from the bathroom without difficulty.  He is not tremulous or tachycardic.  He does not appear to be withdrawing.  The patient has been to the ED 21 times in the past 6 months including nearly every day since 09/05/2014. Per pt's ED care plan, " Recent admissions to [facility] prevent him from returning to those treatment centers. No reported prior history of suicide attempts. No documented history of complicated ETOH withdrawal."  I do not believe he is a candidate for inpatient alcohol detox.  He will again be given outpatient resources.  [facility] and have encouraged him to seek treatment services on his own as well.   Labs drawn yesterday, K 3.0.  Pt will be given multivitamin, thiamine, folate, KCl.    Lacey Jensenodd Betzer evaluation in the Emergency Department is complete. It has been determined that no acute conditions requiring further emergency intervention are  present at this time. The patient/guardian have been advised of the diagnosis and plan. We have discussed signs and symptoms that warrant return to the ED, such as changes or worsening in symptoms, plans to hurt yourself or others.      Toy CookeyMegan Docherty, MD 09/14/14 [phone]

## 2014-09-14 NOTE — ED Notes (Signed)
Per EMS pt comes in for intoxication. EMS was called by passerby who saw pt.  Pt has stable gait as he walked in with EMS from ambulance bay to triage exam room. Pt A&Ox4.

## 2014-09-14 NOTE — Discharge Instructions (Signed)
Alcohol Use Disorder Alcohol use disorder is a mental disorder. It is not a one-time incident of heavy drinking. Alcohol use disorder is the excessive and uncontrollable use of alcohol over time that leads to problems with functioning in one or more areas of daily living. People with this disorder risk harming themselves and others when they drink to excess. Alcohol use disorder also can cause other mental disorders, such as mood and anxiety disorders, and serious physical problems. People with alcohol use disorder often misuse other drugs.  Alcohol use disorder is common and widespread. Some people with this disorder drink alcohol to cope with or escape from negative life events. Others drink to relieve chronic pain or symptoms of mental illness. People with a family history of alcohol use disorder are at higher risk of losing control and using alcohol to excess.  SYMPTOMS  Signs and symptoms of alcohol use disorder may include the following:   Consumption ofalcohol inlarger amounts or over a longer period of time than intended.  Multiple unsuccessful attempts to cutdown or control alcohol use.   A great deal of time spent obtaining alcohol, using alcohol, or recovering from the effects of alcohol (hangover).  A strong desire or urge to use alcohol (cravings).   Continued use of alcohol despite problems at work, school, or home because of alcohol use.   Continued use of alcohol despite problems in relationships because of alcohol use.  Continued use of alcohol in situations when it is physically hazardous, such as driving a car.  Continued use of alcohol despite awareness of a physical or psychological problem that is likely related to alcohol use. Physical problems related to alcohol use can involve the brain, heart, liver, stomach, and intestines. Psychological problems related to alcohol use include intoxication, depression, anxiety, psychosis, delirium, and dementia.   The need for  increased amounts of alcohol to achieve the same desired effect, or a decreased effect from the consumption of the same amount of alcohol (tolerance).  Withdrawal symptoms upon reducing or stopping alcohol use, or alcohol use to reduce or avoid withdrawal symptoms. Withdrawal symptoms include:  Racing heart.  Hand tremor.  Difficulty sleeping.  Nausea.  Vomiting.  Hallucinations.  Restlessness.  Seizures. DIAGNOSIS Alcohol use disorder is diagnosed through an assessment by your health care provider. Your health care provider may start by asking three or four questions to screen for excessive or problematic alcohol use. To confirm a diagnosis of alcohol use disorder, at least two symptoms must be present within a 12-month period. The severity of alcohol use disorder depends on the number of symptoms:  Mild--two or three.  Moderate--four or five.  Severe--six or more. Your health care provider may perform a physical exam or use results from lab tests to see if you have physical problems resulting from alcohol use. Your health care provider may refer you to a mental health professional for evaluation. TREATMENT  Some people with alcohol use disorder are able to reduce their alcohol use to low-risk levels. Some people with alcohol use disorder need to quit drinking alcohol. When necessary, mental health professionals with specialized training in substance use treatment can help. Your health care provider can help you decide how severe your alcohol use disorder is and what type of treatment you need. The following forms of treatment are available:   Detoxification. Detoxification involves the use of prescription medicines to prevent alcohol withdrawal symptoms in the first week after quitting. This is important for people with a history of symptoms   of withdrawal and for heavy drinkers who are likely to have withdrawal symptoms. Alcohol withdrawal can be dangerous and, in severe cases, cause  death. Detoxification is usually provided in a hospital or in-patient substance use treatment facility.  Counseling or talk therapy. Talk therapy is provided by substance use treatment counselors. It addresses the reasons people use alcohol and ways to keep them from drinking again. The goals of talk therapy are to help people with alcohol use disorder find healthy activities and ways to cope with life stress, to identify and avoid triggers for alcohol use, and to handle cravings, which can cause relapse.  Medicines.Different medicines can help treat alcohol use disorder through the following actions:  Decrease alcohol cravings.  Decrease the positive reward response felt from alcohol use.  Produce an uncomfortable physical reaction when alcohol is used (aversion therapy).  Support groups. Support groups are run by people who have quit drinking. They provide emotional support, advice, and guidance. These forms of treatment are often combined. Some people with alcohol use disorder benefit from intensive combination treatment provided by specialized substance use treatment centers. Both inpatient and outpatient treatment programs are available. Document Released: 07/20/2004 Document Revised: 10/27/2013 Document Reviewed: 09/19/2012 Exit[employer] Patient Information 2015 [employer]. This information is not intended to replace advice given to you by your health care provider. Make sure you discuss any questions you have with your health care provider.  

## 2014-09-15 ENCOUNTER — Emergency Department (HOSPITAL_COMMUNITY)
Admission: EM | Admit: 2014-09-15 | Discharge: 2014-09-15 | Disposition: A | Discharge status: Home / Self Care | Payer: Self-pay | Attending: Emergency Medicine | Admitting: Emergency Medicine

## 2014-09-15 ENCOUNTER — Encounter (HOSPITAL_COMMUNITY): Payer: Self-pay | Admitting: *Deleted

## 2014-09-15 DIAGNOSIS — F10920 Alcohol use, unspecified with intoxication, uncomplicated: Secondary | ICD-10-CM

## 2014-09-15 DIAGNOSIS — Z720 Tobacco use: Secondary | ICD-10-CM | POA: Insufficient documentation

## 2014-09-15 DIAGNOSIS — Z79899 Other long term (current) drug therapy: Secondary | ICD-10-CM | POA: Insufficient documentation

## 2014-09-15 DIAGNOSIS — F10220 Alcohol dependence with intoxication, uncomplicated: Secondary | ICD-10-CM | POA: Insufficient documentation

## 2014-09-15 DIAGNOSIS — F1010 Alcohol abuse, uncomplicated: Secondary | ICD-10-CM

## 2014-09-15 DIAGNOSIS — F319 Bipolar disorder, unspecified: Secondary | ICD-10-CM | POA: Insufficient documentation

## 2014-09-15 NOTE — ED Notes (Signed)
Patient was asleep in front of [employer] [facility] and would not get up when asked by staff and alcohol could be smelled so EMS called. Patient told the registrar to not bother him so he could sleep.

## 2014-09-15 NOTE — ED Provider Notes (Signed)
CSN: [id number]     Arrival date & time 09/15/14  1058 History   First MD Initiated Contact with Patient 09/15/14 1103     Chief Complaint  Patient presents with  . Alcohol Intoxication     (Consider location/radiation/quality/duration/timing/severity/associated sxs/prior Treatment) Patient is a 44 y.o. male presenting with intoxication. The history is provided by the patient.  Alcohol Intoxication Pertinent negatives include no chest pain, no abdominal pain, no headaches and no shortness of breath.  pt with hx etoh abuse, after presenting to shelter intoxicated.  Pt notes frequent etoh abuse.  Pt indicates has been through detox at [facility] in past, and has gone back to drinking.  Pt declines inpatient rehab or detox.  Pt denies any physical complaint, denies fall or injury.  Denies hx complicated etoh withdrawal, seizures or dts. Denies depression or thoughts of self harm.  No fevers. No headaches. No neck or back pain. Denies nv.      Past Medical History  Diagnosis Date  . Alcohol abuse   . Depression   . Bipolar 1 disorder, depressed    Past Surgical History  Procedure Laterality Date  . No past surgeries     Family History  Problem Relation Age of Onset  . Hypertension Mother   . Cancer Father   . Heart failure Father    History  Substance Use Topics  . Smoking status: Current Every Day Smoker  . Smokeless tobacco: Not on file  . Alcohol Use: Yes     Comment: daily - "whatever I can get" - 1/5 plus daily    Review of Systems  Constitutional: Negative for fever.  HENT: Negative for sore throat.   Eyes: Negative for redness.  Respiratory: Negative for cough and shortness of breath.   Cardiovascular: Negative for chest pain and leg swelling.  Gastrointestinal: Negative for vomiting, abdominal pain and diarrhea.  Genitourinary: Negative for flank pain.  Musculoskeletal: Negative for back pain and neck pain.  Skin: Negative for rash.  Neurological: Negative for  weakness, numbness and headaches.  Hematological: Does not bruise/bleed easily.  Psychiatric/Behavioral: Negative for dysphoric mood.      Allergies  Review of patient's allergies indicates no known allergies.  Home Medications   Prior to Admission medications   Medication Sig Start Date End Date Taking? Authorizing Provider  FLUoxetine (PROZAC) 20 MG capsule Take 20 mg by mouth daily.    Historical Provider, MD  gabapentin (NEURONTIN) 300 MG capsule Take 300 mg by mouth 3 (three) times daily.    Historical Provider, MD  OLANZapine (ZYPREXA) 10 MG tablet Take 10 mg by mouth at bedtime.    Historical Provider, MD  Soft Lens Products (RENU MULTIPLUS LUB/REWETTING) SOLN Place 1 drop into both eyes 4 (four) times daily as needed (dry eyes).    Historical Provider, MD  traZODone (DESYREL) 100 MG tablet Take 100 mg by mouth at bedtime.    Historical Provider, MD   BP 108/59 mmHg  Pulse 84  Temp(Src) 98.1 F (36.7 C) (Oral)  Resp 18  SpO2 93% Physical Exam  Constitutional: He is oriented to person, place, and time. He appears well-developed and well-nourished. No distress.  HENT:  Head: Atraumatic.  Mouth/Throat: Oropharynx is clear and moist.  Eyes: Conjunctivae are normal. Pupils are equal, round, and reactive to light. No scleral icterus.  Neck: Neck supple. No tracheal deviation present.  Cardiovascular: Normal rate, regular rhythm, normal heart sounds and intact distal pulses.   Pulmonary/Chest: Effort normal and breath sounds  normal. No accessory muscle usage. No respiratory distress.  Abdominal: Soft. Bowel sounds are normal. He exhibits no distension. There is no tenderness.  Musculoskeletal: Normal range of motion. He exhibits no edema or tenderness.  Neurological: He is alert and oriented to person, place, and time.  Steady gait.   Skin: Skin is warm and dry. He is not diaphoretic.  Psychiatric:  Alert, content. Normal mood.  Nursing note and vitals reviewed.   ED  Course  Procedures (including critical care time) Labs Review   MDM   Case manager and social worker asked to see patient - referrals provided, offered assistance if patient wants/needs to return closer to his home.  Pt is encouraged to follow up with AA, and use resource guide provided.  Patient ambulates w steady gait, tolerating po fluids.  Pt requests d/c from ED.    Cathren Laine, MD 09/15/14 [phone]

## 2014-09-15 NOTE — ED Notes (Signed)
Bed: [facility] Expected date:  Expected time:  Means of arrival:  Comments: EMS/ETOH 

## 2014-09-15 NOTE — Discharge Instructions (Signed)
It was our pleasure to provide your ER care today - we hope that you feel better.  Avoid any alcohol use.  Follow up with AA, and use resource guide provided for additional community resources.  For mental health issues and/or crisis, go directly to [facility].      Alcohol Intoxication Alcohol intoxication occurs when the amount of alcohol that a person has consumed impairs his or her ability to mentally and physically function. Alcohol directly impairs the normal chemical activity of the brain. Drinking large amounts of alcohol can lead to changes in mental function and behavior, and it can cause many physical effects that can be harmful.  Alcohol intoxication can range in severity from mild to very severe. Various factors can affect the level of intoxication that occurs, such as the person's age, gender, weight, frequency of alcohol consumption, and the presence of other medical conditions (such as diabetes, seizures, or heart conditions). Dangerous levels of alcohol intoxication may occur when people drink large amounts of alcohol in a short period (binge drinking). Alcohol can also be especially dangerous when combined with certain prescription medicines or "recreational" drugs. SIGNS AND SYMPTOMS Some common signs and symptoms of mild alcohol intoxication include:  Loss of coordination.  Changes in mood and behavior.  Impaired judgment.  Slurred speech. As alcohol intoxication progresses to more severe levels, other signs and symptoms will appear. These may include:  Vomiting.  Confusion and impaired memory.  Slowed breathing.  Seizures.  Loss of consciousness. DIAGNOSIS  Your health care provider will take a medical history and perform a physical exam. You will be asked about the amount and type of alcohol you have consumed. Blood tests will be done to measure the concentration of alcohol in your blood. In many places, your blood alcohol level must be lower than 80  mg/dL (1.61%) to legally drive. However, many dangerous effects of alcohol can occur at much lower levels.  TREATMENT  People with alcohol intoxication often do not require treatment. Most of the effects of alcohol intoxication are temporary, and they go away as the alcohol naturally leaves the body. Your health care provider will monitor your condition until you are stable enough to go home. Fluids are sometimes given through an IV access tube to help prevent dehydration.  HOME CARE INSTRUCTIONS  Do not drive after drinking alcohol.  Stay hydrated. Drink enough water and fluids to keep your urine clear or pale yellow. Avoid caffeine.   Only take over-the-counter or prescription medicines as directed by your health care provider.  SEEK MEDICAL CARE IF:   You have persistent vomiting.   You do not feel better after a few days.  You have frequent alcohol intoxication. Your health care provider can help determine if you should see a substance use treatment counselor. SEEK IMMEDIATE MEDICAL CARE IF:   You become shaky or tremble when you try to stop drinking.   You shake uncontrollably (seizure).   You throw up (vomit) blood. This may be bright red or may look like black coffee grounds.   You have blood in your stool. This may be bright red or may appear as a black, tarry, bad smelling stool.   You become lightheaded or faint.  MAKE SURE YOU:   Understand these instructions.  Will watch your condition.  Will get help right away if you are not doing well or get worse. Document Released: 03/22/2005 Document Revised: 02/12/2013 Document Reviewed: 11/15/2012 [facility] Patient Information 2015 [place], [place]. This information is  not intended to replace advice given to you by your health care provider. Make sure you discuss any questions you have with your health care provider.      Alcohol Use Disorder Alcohol use disorder is a mental disorder. It is not a one-time incident  of heavy drinking. Alcohol use disorder is the excessive and uncontrollable use of alcohol over time that leads to problems with functioning in one or more areas of daily living. People with this disorder risk harming themselves and others when they drink to excess. Alcohol use disorder also can cause other mental disorders, such as mood and anxiety disorders, and serious physical problems. People with alcohol use disorder often misuse other drugs.  Alcohol use disorder is common and widespread. Some people with this disorder drink alcohol to cope with or escape from negative life events. Others drink to relieve chronic pain or symptoms of mental illness. People with a family history of alcohol use disorder are at higher risk of losing control and using alcohol to excess.  SYMPTOMS  Signs and symptoms of alcohol use disorder may include the following:   Consumption ofalcohol inlarger amounts or over a longer period of time than intended.  Multiple unsuccessful attempts to cutdown or control alcohol use.   A great deal of time spent obtaining alcohol, using alcohol, or recovering from the effects of alcohol (hangover).  A strong desire or urge to use alcohol (cravings).   Continued use of alcohol despite problems at work, school, or home because of alcohol use.   Continued use of alcohol despite problems in relationships because of alcohol use.  Continued use of alcohol in situations when it is physically hazardous, such as driving a car.  Continued use of alcohol despite awareness of a physical or psychological problem that is likely related to alcohol use. Physical problems related to alcohol use can involve the brain, heart, liver, stomach, and intestines. Psychological problems related to alcohol use include intoxication, depression, anxiety, psychosis, delirium, and dementia.   The need for increased amounts of alcohol to achieve the same desired effect, or a decreased effect from the  consumption of the same amount of alcohol (tolerance).  Withdrawal symptoms upon reducing or stopping alcohol use, or alcohol use to reduce or avoid withdrawal symptoms. Withdrawal symptoms include:  Racing heart.  Hand tremor.  Difficulty sleeping.  Nausea.  Vomiting.  Hallucinations.  Restlessness.  Seizures. DIAGNOSIS Alcohol use disorder is diagnosed through an assessment by your health care provider. Your health care provider may start by asking three or four questions to screen for excessive or problematic alcohol use. To confirm a diagnosis of alcohol use disorder, at least two symptoms must be present within a 36-month period. The severity of alcohol use disorder depends on the number of symptoms:  Mild--two or three.  Moderate--four or five.  Severe--six or more. Your health care provider may perform a physical exam or use results from lab tests to see if you have physical problems resulting from alcohol use. Your health care provider may refer you to a mental health professional for evaluation. TREATMENT  Some people with alcohol use disorder are able to reduce their alcohol use to low-risk levels. Some people with alcohol use disorder need to quit drinking alcohol. When necessary, mental health professionals with specialized training in substance use treatment can help. Your health care provider can help you decide how severe your alcohol use disorder is and what type of treatment you need. The following forms of treatment are  available:   Detoxification. Detoxification involves the use of prescription medicines to prevent alcohol withdrawal symptoms in the first week after quitting. This is important for people with a history of symptoms of withdrawal and for heavy drinkers who are likely to have withdrawal symptoms. Alcohol withdrawal can be dangerous and, in severe cases, cause death. Detoxification is usually provided in a hospital or in-patient substance use treatment  facility.  Counseling or talk therapy. Talk therapy is provided by substance use treatment counselors. It addresses the reasons people use alcohol and ways to keep them from drinking again. The goals of talk therapy are to help people with alcohol use disorder find healthy activities and ways to cope with life stress, to identify and avoid triggers for alcohol use, and to handle cravings, which can cause relapse.  Medicines.Different medicines can help treat alcohol use disorder through the following actions:  Decrease alcohol cravings.  Decrease the positive reward response felt from alcohol use.  Produce an uncomfortable physical reaction when alcohol is used (aversion therapy).  Support groups. Support groups are run by people who have quit drinking. They provide emotional support, advice, and guidance. These forms of treatment are often combined. Some people with alcohol use disorder benefit from intensive combination treatment provided by specialized substance use treatment centers. Both inpatient and outpatient treatment programs are available. Document Released: 07/20/2004 Document Revised: 10/27/2013 Document Reviewed: 09/19/2012 [facility] Patient Information 2015 [place], [place]. This information is not intended to replace advice given to you by your health care provider. Make sure you discuss any questions you have with your health care provider.     Emergency Department Resource Guide 1) Find a Doctor and Pay Out of Pocket Although you won't have to find out who is covered by your insurance plan, it is a good idea to ask around and get recommendations. You will then need to call the office and see if the doctor you have chosen will accept you as a new patient and what types of options they offer for patients who are self-pay. Some doctors offer discounts or will set up payment plans for their patients who do not have insurance, but you will need to ask so you aren't surprised when  you get to your appointment.  2) Contact Your Local Health Department Not all health departments have doctors that can see patients for sick visits, but many do, so it is worth a call to see if yours does. If you don't know where your local health department is, you can check in your phone book. The CDC also has a tool to help you locate your state's health department, and many state websites also have listings of all of their local health departments.  3) Find a Walk-in Clinic If your illness is not likely to be very severe or complicated, you may want to try a walk in clinic. These are popping up all over the country in pharmacies, drugstores, and shopping centers. They're usually staffed by nurse practitioners or physician assistants that have been trained to treat common illnesses and complaints. They're usually fairly quick and inexpensive. However, if you have serious medical issues or chronic medical problems, these are probably not your best option.  No Primary Care Doctor: - Call Health Connect at  [phone] - they can help you locate a primary care doctor that  accepts your insurance, provides certain services, etc. - Physician Referral Service- [phone]  Chronic Pain Problems: Organization         Address  Phone   Notes  [facility]  [phone] Patients need to be referred by their primary care doctor.   Medication Assistance: Organization         Address  Phone   Notes  [facility] Medication [facility] [address] [place], [place] [address] [phone] --Must be a resident of [facility] -- Must have NO insurance coverage whatsoever (no Medicaid/ Medicare, etc.) -- The pt. MUST have a primary care doctor that directs their care regularly and follows them in the community   [facility]ist  [phone]   [employer]  [phone]    Agencies that provide inexpensive medical care: Organization         Address  Phone    Notes  [facility]  [phone]   [facility]    [phone]   [facility] [address] [place], [place] [address] [phone]   [facility] [address] [place], [place] [phone]   Planned Parenthood    [phone]   [facility]    [phone]   [facility]  [address], [place] Phone:  [phone], Fax:  [phone] Hours of Operation:  9 am - 6 pm, M-F.  Also accepts Medicaid/Medicare and self-pay.  [facility]  [address], [place] Phone: [phone], Fax: [phone]. Hours of Operation:  8:30 am - 5:30 pm, M-F.  Also accepts Medicaid and self-pay.  [facility] [place] [address], [place] Phone: [phone]   [facility] [address] Natasha Bence [place], [place] [phone], Ext. [phone] Mondays & Thursdays: 7-9 AM.  First 15 patients are seen on a first come, first serve basis.    Medicaid-accepting [facility] Providers:  Organization         Address  Phone   Notes  [facility] [address], [place] [phone] Also accepts self-pay patients.  [facility] [address] Laurell Josephs [place], [place]  [phone]   [facility] [address], [place] [phone]   [facility] [address], [place] [phone]   Renaye Rakers [address], [place]   [phone] Only accepts [place] patients after they have their name applied to their card.   Self-Pay (no insurance) in [facility]:  Organization         Address  Phone   Notes  Sickle Cell Patients, [facility] [address] [place], [place] [phone]   [facility] [address] [place], [place] [phone]   [facility]  [address], [place] [phone]   [facility]/Dr. Osei-Bonsu  [address] [place] or [address], [place] [phone] Phone number for both [place] and [place] locations is the same.  Urgent Medical and [facility] [address] [place] [phone]   [facility] [address] [place] or [address] [phone]   [facility] [address], [place] [phone], phone; [phone], fax Sees patients 1st and 3rd Saturday of every month.  Must not qualify for public  or private insurance (i.e. Medicaid, Medicare, [place] Health Choice, Veterans' Benefits)  Household income should be no more than 200% of the poverty level The clinic cannot treat you if you are pregnant or think you are pregnant  Sexually transmitted diseases are not treated at the clinic.    Dental Care: Organization         Address  Phone  Notes  [facility] of [facility] [address] [place], [place] [phone] Accepts children up to age 54 who are enrolled in [place] or [place] Health Choice; pregnant women with a Medicaid card; and children who have applied for Medicaid or [place] Health Choice, but were declined, whose parents can pay a reduced fee at time of service.  [facility]  [address], [place] [phone] Accepts children up to age 53 who are enrolled in [place] or [place] Health Choice; pregnant women with a Medicaid card; and children who have applied for Medicaid or  Health Choice, but were declined, whose parents can pay a reduced fee at time of service.  [facility]  [address] [place], [place] [phone] Patients are seen by appointment only. Walk-ins are not accepted. [facility] will see patients 27 years of age  and older. Monday - Tuesday (8am-5pm) Most Wednesdays (8:30-5pm) $30 per visit, cash only  [facility]  [address], [facility] [phone] Patients are seen by appointment only. Walk-ins are not accepted. [facility] will see patients 84 years of age and older. One Wednesday Evening (Monthly: Volunteer Based).  $30 per visit, cash only  [employer]  [phone] for adults; Children under age 62, call Graduate Pediatric Dentistry at [phone]. Children aged 31-14, please call [phone] to request a pediatric application.  Dental services are provided in all areas of dental care including fillings, crowns and bridges, complete and partial dentures, implants, gum treatment, root canals, and extractions. Preventive care is also provided. Treatment is provided to both adults and children. Patients are selected via a lottery and there is often a waiting list.   [facility] [address], [place]  [phone] [email or web address]   [facility] [address] [place], [place] [phone], Ext. [phone] Second and Fourth Thursday of each month, opens at 6:30 AM; Clinic ends at 9 AM.  Patients are seen on a first-come first-served basis, and a limited number are seen during each clinic.   [facility]  [address] Ether Griffins [place], [place] [phone]   Eligibility Requirements You must have lived in [place], [place], or [place] counties for at least the last three months.   You cannot be eligible for state or federal sponsored [place], including [employer], [place], or [employer].   You generally cannot be eligible for healthcare insurance through your employer.    How to apply: Eligibility screenings are held every Tuesday and Wednesday afternoon from 1:00 pm until 4:00 pm. You do not need an appointment for the interview!  [facility] [address], [place], [place] [phone]   [facility] Health Department  [phone]   [facility] Health Department  [phone]   [facility] Health Department  [phone]    Behavioral Health Resources in the Community: Intensive Outpatient Programs Organization         Address  Phone  Notes  [facility] [address], [place], [place] [phone]   [facility] [address], [place], [place] [phone]   [facility] [address], [place], [place]  [phone]   [facility] [address],  [place], [place] [phone] or [phone]   Substance Abuse Resources Organization         Address  Phone  Notes  [facility] Drug Services  [phone]   [facility]  [phone]   [facility]  [phone]   [facility]  [phone]   Residential & Outpatient Substance Abuse Program  [phone]   Psychological Services Organization         Address  Phone  Notes  [facility]  [phone]- [phone]   [facility]  [phone]- [phone]   [facility] [address], [place] [phone] or [phone]    Mobile Crisis Teams Organization         Address  Phone  Notes  [facility], Mobile Crisis Care Unit  [phone]   [facility]  [address] [place], [place] [phone]   Doristine LocksSharon DeEsch [address] [place] [phone]    Self-Help/Support Groups Organization         Address  Phone             Notes  Mental Health Assoc. of  - variety of support groups  336- [phone] Call for more information  Narcotics Anonymous (NA), [facility] [address], [place]  2 meetings at this location   Statisticianesidential Treatment Programs Organization         Address  Phone  Notes  [facility] [address],    [place]  [phone]   [facility]  [address], [place] [phone], [place], [place] [phone]   [facility] [address], [place] [phone] Admissions: 8am-3pm M-F  [facility] [address]    [place], [place] [phone]   [facility] [address], [place], [place] [phone]   [facility] [address]  [place], [place] [phone]   [facility] - Intensive Outpatient [address], [place], [place] [phone]   [facility] ([facility] [address]  [place], [place] [phone] or [phone]   [facility] ([facility]) [address] [place], [place] [phone] Accepts Medicaid  [facility] [address]  [place] [phone] Substance Abuse/Addiction Treatment   [facility] Resources Organization         Address  Phone  Notes  [facility]  [phone]) [phone]   Angie FavaJulie Brannon, PhD [address], [address] [place], [place]   [phone] or [phone](336) [phone]   [facility]   [address] [place], [place] [phone](336) [phone]   [facility] [address], [place], [place] [phone] Insurance/Medicaid/sponsorship through [facility]  [facility] Families [address]                                    [place], [place] [phone] Therapy/tele-psych/case  [facility] [address]   [place], [place] [phone]    Dr. Lolly MustacheArfeen  [phone]   [facility]  [employer] [facility]  Dept. 1) [address], [place] 2) [address], [place] 3)  [address], [place] [phone]) [phone]  [phone]   [facility] Child Abuse Hotline [phone]) [phone] or [phone]) [phone] (After Hours)

## 2014-09-15 NOTE — Progress Notes (Addendum)
44 yr old [place] with [facility] ED visits since 09/15/14. Dr Denton LankSteinl discussed this pt[facility] with [facility] CM. CM spoke with ED SW.  Pt has informed staff he wanted to be left alone. Pt refusing to open his eyes during assessment.  Pt cursing at ED SW &CM.  Pt awakened.. ED SW & ED CM spoke with pt about 22 ED visits and his goals in care.  With assessment of pt he reports having a mother Keenan Bachelor(Bren[place]que[place]) and sister "They are dead to me" Encouraged pt to help CM & SW determine goals for community treatment for him.  Reports he "want to detox and go to rehab" This pt has been to detox but not successful and has been given resources for follow up but has not completed.  Pt states he has these resources "[place] the [place] I have [place]" [facility]. Pt states there are no detox resources or programs [place].  [place]  [place] self [place] resources, [place] [facility] services and detox center  1223 call attempts to pt mother at [phone] & [phone] both lines busy

## 2014-09-15 NOTE — Progress Notes (Signed)
CSW and RN CM met with pt at bedside to discuss pt 22 ED visits since 08/25/2014. Patient refused to answer, wake up, or talk to CSW or Rn CM. RN brought ammonia to assist with waking patient up. Patient agreed to talk at that time however agitated and cussing at [place] and RN CM. Patient states that he wants detox and rehab. Patient has been offered detox in the past as well as rehab and patient continues to not follow up with Rehab. Patient has had multiple ed visits later related to being found intoxicated and unresponsive. CSW and RN CM discussed with patient options upon discharge. Pt states he does not want the hosptial to contact family in [place], [place], does not want rehab resource list again (pt states he still has), and wants to leave. Patient RN informed and patient told he would have to ambulate before being discharged. No further Clinical Social Work needs, signing off.   Belia Heman, [place] Work  [employer] [phone]

## 2014-09-15 NOTE — ED Notes (Signed)
Pt arguing with Chrissie NoaWilliam NT about having to get up to ambulate.  Pt walked to restroom #4 with steady gait. Pt cursing bc he was having to get up off stretcher and ambulate. Security and [employer] at bedside. Security asking pt to watch his dirty language due to being offensive to other patients and they visitors.

## 2014-09-16 ENCOUNTER — Encounter (HOSPITAL_COMMUNITY): Payer: Self-pay | Admitting: *Deleted

## 2014-09-16 ENCOUNTER — Emergency Department (HOSPITAL_COMMUNITY)
Admission: EM | Admit: 2014-09-16 | Discharge: 2014-09-16 | Disposition: A | Discharge status: Home / Self Care | Payer: Self-pay | Attending: Emergency Medicine | Admitting: Emergency Medicine

## 2014-09-16 ENCOUNTER — Encounter (HOSPITAL_COMMUNITY): Payer: Self-pay

## 2014-09-16 ENCOUNTER — Emergency Department (HOSPITAL_COMMUNITY)
Admission: EM | Admit: 2014-09-16 | Discharge: 2014-09-17 | Disposition: A | Discharge status: Home / Self Care | Payer: Self-pay | Attending: Emergency Medicine | Admitting: Emergency Medicine

## 2014-09-16 DIAGNOSIS — Z79899 Other long term (current) drug therapy: Secondary | ICD-10-CM | POA: Insufficient documentation

## 2014-09-16 DIAGNOSIS — Z720 Tobacco use: Secondary | ICD-10-CM | POA: Insufficient documentation

## 2014-09-16 DIAGNOSIS — F10920 Alcohol use, unspecified with intoxication, uncomplicated: Secondary | ICD-10-CM

## 2014-09-16 DIAGNOSIS — F10230 Alcohol dependence with withdrawal, uncomplicated: Secondary | ICD-10-CM | POA: Insufficient documentation

## 2014-09-16 DIAGNOSIS — F10120 Alcohol abuse with intoxication, uncomplicated: Secondary | ICD-10-CM | POA: Insufficient documentation

## 2014-09-16 DIAGNOSIS — F319 Bipolar disorder, unspecified: Secondary | ICD-10-CM | POA: Insufficient documentation

## 2014-09-16 MED ORDER — AMMONIA AROMATIC IN INHA
RESPIRATORY_TRACT | Status: AC
  Filled 2014-09-16: qty 10

## 2014-09-16 NOTE — ED Provider Notes (Signed)
Discussed case with Jordan SitesLisa Sanders, PA-C. Transfer of care to this provider at change in shift.   Jordan Jensenodd Bultman is a 44 y/o M with PMHx of alcohol abuse, depression, bipolar disorder who is familiar with the ED. Patient brought in by EMS secondary to alcohol intoxication. Patient was found at a local [employer] drinking mouthwash. As per previous note, patient admitted to drinking vodka earlier today as well. Patient has been seen in the ED for the same, alcohol intoxication, 18 times in the month of March 2016. Patient was seen yesterday, 09/15/2014, and offered placement - patient cursed out the staff and refused.   Plan: Sober up and discharged the patient.   4:09 PM Patient seen and assessed by attending physician, Dr. Cordelia PocheA. Allen. Patient threatened to hit the nurse. Patient sober and ambulated fine. Patient stable, afebrile. Patient not septic appearing. Negative signs of respiratory distress. Patient to be discharged and taken into the custody of [facility].   Medications  ammonia inhalant (not administered)   Filed Vitals:   09/16/14 1336  BP: 110/56  Pulse: 80  Resp: 18  SpO2: 91%   Diagnoses that have been ruled out:  None  Diagnoses that are still under consideration:  None  Final diagnoses:  Alcohol intoxication, uncomplicated     Jordan MuttonMarissa Blakely Gluth, PA-C 09/16/14 1616  Lorre NickAnthony Allen, MD 09/17/14 1652

## 2014-09-16 NOTE — ED Notes (Signed)
Patient given community resources by social work. Patient resting in bed, no needs voiced.

## 2014-09-16 NOTE — ED Notes (Signed)
Patient ambulatory to restroom  ?

## 2014-09-16 NOTE — ED Notes (Signed)
[facility] Expected date:  Expected time:  Means of arrival:  Comments: EMS  Drank listerine

## 2014-09-16 NOTE — Progress Notes (Addendum)
EDCM went to speak to patient at bedside, however patient sound asleep.  Patient has [facility]  Patient noted to have 24 ED visits within the last six months.  EDCM placed pamph[facility], and pamphlet for [facility] at patient bedside.  EDCM did not wake patient at this time.  No further EDCM needs at this time.

## 2014-09-16 NOTE — ED Notes (Signed)
Pt being uncooperative.  Pt's O2 sat stays in the 80s with pt laying down.  Pt informed that he needed to sit up and take deep breaths.  Pt not responding to verbal stimuli but keeps his eyes tightly shut.  Pt states "If you stick that (ammonia) up my nose, I'm going to slap the fuck out of you".  Security/[employer] called to bedside.  Pt assisted to sitting position, O2 saturations up to 90s when sitting up.

## 2014-09-16 NOTE — ED Notes (Signed)
Pt not being cooperative with staff. Security to help sit pt up in bed.

## 2014-09-16 NOTE — ED Notes (Signed)
Pt refused to sign DC instruction, instruction reviewed. Pt alert at time of instruction review. Pt currently resting, awaiting police escort

## 2014-09-16 NOTE — Discharge Instructions (Signed)
Alcohol Use Disorder Alcohol use disorder is a mental disorder. It is not a one-time incident of heavy drinking. Alcohol use disorder is the excessive and uncontrollable use of alcohol over time that leads to problems with functioning in one or more areas of daily living. People with this disorder risk harming themselves and others when they drink to excess. Alcohol use disorder also can cause other mental disorders, such as mood and anxiety disorders, and serious physical problems. People with alcohol use disorder often misuse other drugs.  Alcohol use disorder is common and widespread. Some people with this disorder drink alcohol to cope with or escape from negative life events. Others drink to relieve chronic pain or symptoms of mental illness. People with a family history of alcohol use disorder are at higher risk of losing control and using alcohol to excess.  SYMPTOMS  Signs and symptoms of alcohol use disorder may include the following:   Consumption ofalcohol inlarger amounts or over a longer period of time than intended.  Multiple unsuccessful attempts to cutdown or control alcohol use.   A great deal of time spent obtaining alcohol, using alcohol, or recovering from the effects of alcohol (hangover).  A strong desire or urge to use alcohol (cravings).   Continued use of alcohol despite problems at work, school, or home because of alcohol use.   Continued use of alcohol despite problems in relationships because of alcohol use.  Continued use of alcohol in situations when it is physically hazardous, such as driving a car.  Continued use of alcohol despite awareness of a physical or psychological problem that is likely related to alcohol use. Physical problems related to alcohol use can involve the brain, heart, liver, stomach, and intestines. Psychological problems related to alcohol use include intoxication, depression, anxiety, psychosis, delirium, and dementia.   The need for  increased amounts of alcohol to achieve the same desired effect, or a decreased effect from the consumption of the same amount of alcohol (tolerance).  Withdrawal symptoms upon reducing or stopping alcohol use, or alcohol use to reduce or avoid withdrawal symptoms. Withdrawal symptoms include:  Racing heart.  Hand tremor.  Difficulty sleeping.  Nausea.  Vomiting.  Hallucinations.  Restlessness.  Seizures. DIAGNOSIS Alcohol use disorder is diagnosed through an assessment by your health care provider. Your health care provider may start by asking three or four questions to screen for excessive or problematic alcohol use. To confirm a diagnosis of alcohol use disorder, at least two symptoms must be present within a 12-month period. The severity of alcohol use disorder depends on the number of symptoms:  Mild--two or three.  Moderate--four or five.  Severe--six or more. Your health care provider may perform a physical exam or use results from lab tests to see if you have physical problems resulting from alcohol use. Your health care provider may refer you to a mental health professional for evaluation. TREATMENT  Some people with alcohol use disorder are able to reduce their alcohol use to low-risk levels. Some people with alcohol use disorder need to quit drinking alcohol. When necessary, mental health professionals with specialized training in substance use treatment can help. Your health care provider can help you decide how severe your alcohol use disorder is and what type of treatment you need. The following forms of treatment are available:   Detoxification. Detoxification involves the use of prescription medicines to prevent alcohol withdrawal symptoms in the first week after quitting. This is important for people with a history of symptoms   of withdrawal and for heavy drinkers who are likely to have withdrawal symptoms. Alcohol withdrawal can be dangerous and, in severe cases, cause  death. Detoxification is usually provided in a hospital or in-patient substance use treatment facility.  Counseling or talk therapy. Talk therapy is provided by substance use treatment counselors. It addresses the reasons people use alcohol and ways to keep them from drinking again. The goals of talk therapy are to help people with alcohol use disorder find healthy activities and ways to cope with life stress, to identify and avoid triggers for alcohol use, and to handle cravings, which can cause relapse.  Medicines.Different medicines can help treat alcohol use disorder through the following actions:  Decrease alcohol cravings.  Decrease the positive reward response felt from alcohol use.  Produce an uncomfortable physical reaction when alcohol is used (aversion therapy).  Support groups. Support groups are run by people who have quit drinking. They provide emotional support, advice, and guidance. These forms of treatment are often combined. Some people with alcohol use disorder benefit from intensive combination treatment provided by specialized substance use treatment centers. Both inpatient and outpatient treatment programs are available. Document Released: 07/20/2004 Document Revised: 10/27/2013 Document Reviewed: 09/19/2012 Exit[employer] Patient Information 2015 [employer]. This information is not intended to replace advice given to you by your health care provider. Make sure you discuss any questions you have with your health care provider.  

## 2014-09-16 NOTE — ED Provider Notes (Addendum)
CSN: [id number]     Arrival date & time 09/16/14  2008 History   First MD Initiated Contact with Patient 09/16/14 2028     Chief Complaint  Patient presents with  . Alcohol Intoxication     (Consider location/radiation/quality/duration/timing/severity/associated sxs/prior Treatment) HPI Comments: Patient here with alcohol intoxication. Has been here every day for the past 3 weeks. Was just discharged 2 hours ago and went and admits to drinking alcohol, vodka and Listerine. Denies any suicidal or homicidal ideations. No hallucinations. Patient found wandering and EMS was called and patient presents here. No further history obtainable due to his intoxicated state. He has no visible signs of trauma  Patient is a 44 y.o. male presenting with intoxication. The history is provided by the patient. The history is limited by the condition of the patient.  Alcohol Intoxication    Past Medical History  Diagnosis Date  . Alcohol abuse   . Depression   . Bipolar 1 disorder, depressed    Past Surgical History  Procedure Laterality Date  . No past surgeries     Family History  Problem Relation Age of Onset  . Hypertension Mother   . Cancer Father   . Heart failure Father    History  Substance Use Topics  . Smoking status: Current Every Day Smoker  . Smokeless tobacco: Not on file  . Alcohol Use: Yes     Comment: daily - "whatever I can get" - 1/5 plus daily    Review of Systems  Unable to perform ROS     Allergies  Review of patient's allergies indicates no known allergies.  Home Medications   Prior to Admission medications   Medication Sig Start Date End Date Taking? Authorizing Provider  FLUoxetine (PROZAC) 20 MG capsule Take 20 mg by mouth daily.    Historical Provider, MD  gabapentin (NEURONTIN) 300 MG capsule Take 300 mg by mouth 3 (three) times daily.    Historical Provider, MD  OLANZapine (ZYPREXA) 10 MG tablet Take 10 mg by mouth at bedtime.    Historical Provider, MD   Soft Lens Products (RENU MULTIPLUS LUB/REWETTING) SOLN Place 1 drop into both eyes 4 (four) times daily as needed (dry eyes).    Historical Provider, MD  traZODone (DESYREL) 100 MG tablet Take 100 mg by mouth at bedtime.    Historical Provider, MD   BP 124/78 mmHg  Pulse 95  Temp(Src) 97.5 F (36.4 C) (Oral)  Resp 20  Ht 6' (1.829 m)  Wt 160 lb (72.576 kg)  BMI 21.70 kg/m2  SpO2 97% Physical Exam  Constitutional: He appears well-developed and well-nourished. He appears lethargic.  Non-toxic appearance. No distress.  HENT:  Head: Normocephalic and atraumatic.  Eyes: Conjunctivae, EOM and lids are normal. Pupils are equal, round, and reactive to light.  Neck: Normal range of motion. Neck supple. No tracheal deviation present. No thyroid mass present.  Cardiovascular: Normal rate, regular rhythm and normal heart sounds.  Exam reveals no gallop.   No murmur heard. Pulmonary/Chest: Effort normal and breath sounds normal. No stridor. No respiratory distress. He has no decreased breath sounds. He has no wheezes. He has no rhonchi. He has no rales.  Abdominal: Soft. Normal appearance and bowel sounds are normal. He exhibits no distension. There is no tenderness. There is no rebound and no CVA tenderness.  Musculoskeletal: Normal range of motion. He exhibits no edema or tenderness.  Neurological: He has normal strength. He appears lethargic. No cranial nerve deficit or sensory deficit.  GCS eye subscore is 4. GCS verbal subscore is 5. GCS motor subscore is 6.  Skin: Skin is warm and dry. No abrasion and no rash noted.  Psychiatric: His affect is blunt. His speech is delayed. He is withdrawn.  Nursing note and vitals reviewed.   ED Course  Procedures (including critical care time) Labs Review Labs Reviewed - No data to display  Imaging Review No results found.   EKG Interpretation None      MDM   Final diagnoses:  None    Patient we monitored here until clinically sober and  then discharged 11:51 PM Pt now clinically sober, speaking clearly, gait normal, rehab resources given  Lorre NickAnthony Alexsandro Salek, MD 09/16/14 2034  Lorre NickAnthony Lumi Winslett, MD 09/16/14 [id number]  Lorre NickAnthony Adonus Uselman, MD 09/16/14 2352

## 2014-09-16 NOTE — ED Provider Notes (Signed)
CSN: [id number]     Arrival date & time 09/16/14  1252 History   First MD Initiated Contact with Patient 09/16/14 1315     Chief Complaint  Patient presents with  . Alcohol Intoxication     (Consider location/radiation/quality/duration/timing/severity/associated sxs/prior Treatment) Patient is a 44 y.o. male presenting with intoxication. The history is provided by the patient and medical records.  Alcohol Intoxication   This is a 44 year old male with past medical history Cipro for alcohol abuse, depression, bipolar disorder, presenting to the ED for acute alcohol intoxication.  Patient was found in a local [employer] in the bathroom with a bottle of mouthwash that he was drinking.  Patient admits to drinking vodka as well. He states he did not want to come to the ED but EMS brought him anyway.  He denies any falls or injuries.  Patient has no specific complaints at this time.  Of note, patient was evaluated by case management yesterday and offered multiple services for assistance however he remained rude and cursing at staff.  VSS on arrival.   Past Medical History  Diagnosis Date  . Alcohol abuse   . Depression   . Bipolar 1 disorder, depressed    Past Surgical History  Procedure Laterality Date  . No past surgeries     Family History  Problem Relation Age of Onset  . Hypertension Mother   . Cancer Father   . Heart failure Father    History  Substance Use Topics  . Smoking status: Current Every Day Smoker  . Smokeless tobacco: Not on file  . Alcohol Use: Yes     Comment: daily - "whatever I can get" - 1/5 plus daily    Review of Systems  Psychiatric/Behavioral:       EtOH  All other systems reviewed and are negative.     Allergies  Review of patient's allergies indicates no known allergies.  Home Medications   Prior to Admission medications   Medication Sig Start Date End Date Taking? Authorizing Provider  FLUoxetine (PROZAC) 20 MG capsule Take 20 mg by mouth  daily.    Historical Provider, MD  gabapentin (NEURONTIN) 300 MG capsule Take 300 mg by mouth 3 (three) times daily.    Historical Provider, MD  OLANZapine (ZYPREXA) 10 MG tablet Take 10 mg by mouth at bedtime.    Historical Provider, MD  Soft Lens Products (RENU MULTIPLUS LUB/REWETTING) SOLN Place 1 drop into both eyes 4 (four) times daily as needed (dry eyes).    Historical Provider, MD  traZODone (DESYREL) 100 MG tablet Take 100 mg by mouth at bedtime.    Historical Provider, MD   BP 110/56 mmHg  Pulse 80  Resp 18  SpO2 91%   Physical Exam  Constitutional: He is oriented to person, place, and time. He appears well-developed and well-nourished.  Dirty appearance, smells of EtoH  HENT:  Head: Normocephalic and atraumatic.  Mouth/Throat: Oropharynx is clear and moist.  Eyes: Conjunctivae and EOM are normal. Pupils are equal, round, and reactive to light.  Neck: Normal range of motion.  Cardiovascular: Normal rate, regular rhythm and normal heart sounds.   Pulmonary/Chest: Effort normal and breath sounds normal.  Abdominal: Soft. Bowel sounds are normal.  Musculoskeletal: Normal range of motion.  Neurological: He is alert and oriented to person, place, and time.  Skin: Skin is warm and dry.  Psychiatric: He has a normal mood and affect.  Nursing note and vitals reviewed.   ED Course  Procedures (  including critical care time) Labs Review Labs Reviewed - No data to display  Imaging Review No results found.   EKG Interpretation None      MDM   Final diagnoses:  Alcohol intoxication, uncomplicated   44 year old male here acutely intoxicated. Multiple ED visits for the same. He has no current complaints at this time, states he simply wants to be "left alone".  Case management met with patient yesterday and offered various services. Patient's vital signs are currently stable. He will be allowed to sober and discharged home when he can ambulate with steady gait and tolerate PO  fluids.  3:44 PM  Care signed out to PA [place] at shift change.  Will d/c when clinically sober.  Larene Pickett, PA-C 09/16/14 [place], MD 10/02/14 [phone]

## 2014-09-16 NOTE — ED Notes (Addendum)
Per EMS pt found outside a [employer] bath room with a bottle of mouth wash. Upon EMS arrival pt reports drinking vodka, amount unknown. VS 108/60, 80, 16, 98%ra, cbg 102. Pt seems to be agitated and prefers to be left alone at this time. No n/v

## 2014-09-16 NOTE — ED Notes (Signed)
[facility] after EMS was called by grocery store manager-patient was drinking listerine in the store. Patient arrives A&OX4, slurred speech, ambulatory. Patient was seen earlier today for ETOH.

## 2014-09-16 NOTE — ED Notes (Signed)
[facility] Expected date:  Expected time:  Means of arrival:  Comments: Lacey Jensenodd Laramee

## 2014-09-16 NOTE — Discharge Instructions (Signed)
Alcohol Intoxication °Alcohol intoxication occurs when you drink enough alcohol that it affects your ability to function. It can be mild or very severe. Drinking a lot of alcohol in a short time is called binge drinking. This can be very harmful. Drinking alcohol can also be more dangerous if you are taking medicines or other drugs. Some of the effects caused by alcohol may include: °· Loss of coordination. °· Changes in mood and behavior. °· Unclear thinking. °· Trouble talking (slurred speech). °· Throwing up (vomiting). °· Confusion. °· Slowed breathing. °· Twitching and shaking (seizures). °· Loss of consciousness. °HOME CARE °· Do not drive after drinking alcohol. °· Drink enough water and fluids to keep your pee (urine) clear or pale yellow. Avoid caffeine. °· Only take medicine as told by your doctor. °GET HELP IF: °· You throw up (vomit) many times. °· You do not feel better after a few days. °· You frequently have alcohol intoxication. Your doctor can help decide if you should see a substance use treatment counselor. °GET HELP RIGHT AWAY IF: °· You become shaky when you stop drinking. °· You have twitching and shaking. °· You throw up blood. It may look bright red or like coffee grounds. °· You notice blood in your poop (bowel movements). °· You become lightheaded or pass out (faint). °MAKE SURE YOU:  °· Understand these instructions. °· Will watch your condition. °· Will get help right away if you are not doing well or get worse. °Document Released: 11/29/2007 Document Revised: 02/12/2013 Document Reviewed: 11/15/2012 °ExitCare® Patient Information ©2015 [employer]. This information is not intended to replace advice given to you by your health care provider. Make sure you discuss any questions you have with your health care provider. ° ° °Emergency Department Resource Guide °1) Find a Doctor and Pay Out of Pocket °Although you won't have to find out who is covered by your insurance plan, it is a good  idea to ask around and get recommendations. You will then need to call the office and see if the doctor you have chosen will accept you as a new patient and what types of options they offer for patients who are self-pay. Some doctors offer discounts or will set up payment plans for their patients who do not have insurance, but you will need to ask so you aren't surprised when you get to your appointment. ° °2) Contact Your Local Health Department °Not all health departments have doctors that can see patients for sick visits, but many do, so it is worth a call to see if yours does. If you don't know where your local health department is, you can check in your phone book. The CDC also has a tool to help you locate your state's health department, and many state websites also have listings of all of their local health departments. ° °3) Find a Walk-in Clinic °If your illness is not likely to be very severe or complicated, you may want to try a walk in clinic. These are popping up all over the country in pharmacies, drugstores, and shopping centers. They're usually staffed by nurse practitioners or physician assistants that have been trained to treat common illnesses and complaints. They're usually fairly quick and inexpensive. However, if you have serious medical issues or chronic medical problems, these are probably not your best option. ° °No Primary Care Doctor: °- Call [facility] at  832-8000 - they can help you locate a primary care doctor that  accepts your insurance, provides   certain services, etc. °- Physician Referral Service- 1-800-533-3463 ° °Chronic Pain Problems: °Organization         Address  Phone   Notes  °[facility]  (336) 297-2271 Patients need to be referred by their primary care doctor.  ° °Medication Assistance: °Organization         Address  Phone   Notes  °[place] [facility] [address] °[place], [place] [address] (336) 641-8030  --Must be a resident of [place] °-- Must have NO insurance coverage whatsoever (no Medicaid/ Medicare, etc.) °-- The pt. MUST have a primary care doctor that directs their care regularly and follows them in the community °  °[facility]  (866) 331-1348   °[employer]  (888) 892-1162   ° °Agencies that provide inexpensive medical care: °Organization         Address  Phone   Notes  °[facility]  (336) 832-8035   °[facility]    (336) 832-7272   °[facility] [address] °[place], [place] [address] (336) 832-4777   °[facility] [address], °[place] (336) 271-4999   °[facility]    (336) 373-0678   °[facility]    (336) 272-1050   °[facility] ° [address], [place] Phone:  (336) 832-4444, Fax:  (336) 832-4440 Hours of Operation:  9 am - 6 pm, M-F.  Also accepts Medicaid/Medicare and self-pay.  °[facility] ° [address], [place] Phone: (336) 832-3150, Fax: (336) 832-3151. Hours of Operation:  8:30 am - 5:30 pm, M-F.  Also accepts Medicaid and self-pay.  °[facility] [place] [address], [place] Phone: (336) 878-6027   °[facility] [address], [place], [place] (336)723-1848, Ext. 123 Mondays & Thursdays: 7-9 AM.  First 15 patients are seen on a first come, first serve basis. °  ° °Medicaid-accepting [place] Providers: ° °Organization         Address  Phone   Notes  °[facility] [address], [place] (336) 641-2100 Also accepts self-pay patients.  °[facility] [address], [place] ° (336) 856-9996   °[facility] [address], [place] (336) 288-8857   °[facility] [address] [place] [address], [place] (336) 299-7000   °Veita Bland [address], [place]  ° (336) 373-1557 Only  accepts [place] Access Medicaid patients after they have their name applied to their card.  ° °Self-Pay (no insurance) in [place]: ° °Organization         Address  Phone   Notes  °Sickle Cell Patients, [facility] [address], [place] (336) 832-1970   °[facility] [address], [place] (336) 832-4400   °[facility] ° [address], [place] (336) 992-4800   °[facility]/Dr. Osei-Bonsu ° [address] [place] [address], [place] or [address], [place] (336) 841-8500 Phone number for both [place] and [place] locations is the same.  °[facility] [address], [place] (336) 299-0000   °[facility] [address] [place] [address], [place] or [address] (336) 852-7530 °(336) 878-2260   °[facility] [address], [place] (336) 350-1642, phone; (336) 294-5005, fax Sees patients 1st and 3rd Saturday of every month.  Must not qualify for public or private insurance (i.e. Medicaid, Medicare, [place] Health Choice, Veterans' Benefits) • Household income should be no more than 200% of the poverty level •The clinic cannot treat you if you are pregnant or think you are pregnant • Sexually transmitted diseases are not treated at the clinic.  ° ° °Dental Care: °Organization         Address  Phone  Notes  °[facility] [address], [place] (336) 641-6152 Accepts children up to age 21 who are enrolled in Medicaid or [place] Health Choice; pregnant women with a Medicaid card; and children who have applied for Medicaid or [place] Health Choice, but were declined, whose parents can pay a reduced fee at time of service.  °[facility] [place]  [address], [place] (336) 641-7733 Accepts children up to age 21 who are enrolled in Medicaid or [place] Health Choice; pregnant  women with a Medicaid card; and children who have applied for Medicaid or [place] Health Choice, but were declined, whose parents can pay a reduced fee at time of service.  °[facility] ° [address], [place] (336) 641-4533 Patients are seen by appointment only. Walk-ins are not accepted. [facility] will see patients 18 years of age and older. °Monday - Tuesday (8am-5pm) °Most Wednesdays (8:30-5pm) °$30 per visit, cash only  °[facility] ° [address], [place] (336) 641-4533 Patients are seen by appointment only. Walk-ins are not accepted. [facility] will see patients 18 years of age and older. °One Wednesday Evening (Monthly: Volunteer Based).  $30 per visit, cash only  °[facility]  (919) 537-3737 for adults; Children under age 4, call Graduate Pediatric Dentistry at ([phone]) [phone]. Children aged 4-14, please call (919) [phone] to request a pediatric application. ° Dental services are provided in all areas of dental care including fillings, crowns and bridges, complete and partial dentures, implants, gum treatment, root canals, and extractions. Preventive care is also provided. Treatment is provided to both adults and children. °Patients are selected via a lottery and there is often a waiting list. °  °[facility] [address], °[place] ° (336) 763-8833 [email or web address] °  °[facility] [address], [place], [place] (336)723-1848, Ext. 123 Second and Fourth Thursday of each month, opens at 6:30 AM; Clinic ends at 9 AM.  Patients are seen on a first-come first-served basis, and a limited number are seen during each clinic.  ° °[facility] ° [address], [place], [place] (336) 723-7904   Eligibility Requirements °You must have lived in [place], [place], or [place] counties for at least the last three months. °  You cannot be eligible for state or federal sponsored  healthcare insurance, including Veterans Administration, Medicaid, or Medicare. °  You generally cannot be eligible for healthcare insurance through your employer.  °  How to apply: °Eligibility screenings are held every Tuesday and Wednesday afternoon from 1:00 pm until 4:00 pm. You do not need an appointment for the interview!  °[facility] [address], [place], [place] 336-631-2330   °[facility]  336-342-8273   °[facility]  336-703-3100   °[facility]  336-570-6415   ° °  Behavioral Health Resources in the Community: °Intensive Outpatient Programs °Organization         Address  Phone  Notes  °[facility] [address], [place], [place] 336-878-6098   °[facility] [address], [place], [place] 336-832-9800   °[facility] [address], [place], [place] ° 336-882-2125   °[facility] [address],  °[place], [place] 1-800-853-5163 or 336-641-4981   °Substance Abuse Resources °Organization         Address  Phone  Notes  °[facility]  336-882-2125   °[facility]  336-784-9470   °[facility]  336-285-9073   °[facility]  336-845-3988   °Residential & Outpatient Substance Abuse Program  1-800-659-3381   °Psychological Services °Organization         Address  Phone  Notes  °[facility]  336- 832-9600   °[facility]  336- 378-7881   °[facility] [address], [place] 1-800-853-5163 or 336-641-4981   ° °Mobile Crisis Teams °Organization         Address  Phone  Notes  °[facility], Mobile Crisis Care Unit  1-877-626-1772   °Assertive °[facility] ° [address] [place], [place] 336-834-9664   °Sharon DeEsch [address] °[place] 336-554-5454   ° °Self-Help/Support Groups °Organization         Address  Phone              Notes  °[facility] [place] - variety of support groups  336- 373-1402 Call for more information  °Narcotics Anonymous (NA), [facility] [address], °[place]  2 meetings at this location  ° °Residential Treatment Programs °Organization         Address  Phone  Notes  °[facility] [address],    °[place]  1-866-801-8205   °[facility] ° [address], [place], [place] 704-293-8524   °[facility] [address], [place] 336-845-3988 Admissions: 8am-3pm M-F  °[facility] [address]    °[place], [place] 336-841-1104   °[facility] [address], [place] 336-379-7146   °[facility] [address]  °[place], [place] 336-285-9073   °[facility] Intensive Outpatient [address], [place], [place] 336-852-3033   °[facility] [address]  °[place], [place] 1-877-615-2722 or 336-784-9470   °[facility] [address] [place], [place] 336-227-7417 Accepts Medicaid  °[facility] [address]  °[place] 1-800-659-3381 Substance Abuse/Addiction Treatment  ° °[place] Behavioral Health Resources °Organization         Address  Phone  Notes  °[facility]  (888) 581-9988   °Julie Brannon, PhD [address] [place], [place]   (336) 349-5553 or (336) 951-0000   °[facility]   [address] °[place], [place] (336) 349-4454   °[facility] [address], [place], [place] (336) 342-8316 Insurance/Medicaid/sponsorship through [facility]  °[facility] [address]                                    [place], [place] (336) 342-8316 Therapy/tele-psych/case  °[facility] [address]  ° [place],   [place] (336) 349-2233    °Dr. Arfeen  (336) 349-4544   °[facility] [place]  [employer] [place] [facility] 1) [address], [place] °2) [address], [place] °3)  [address], [place] (336) 349-3220 °(336) 342-7768 ° °(336) 342-8140   °[place] [facility] (336) 342-1394 or (336) 342-3537 (After Hours)    ° ° ° °

## 2014-09-17 NOTE — ED Notes (Signed)
Patient verbalizes understanding of discharge instructions, home care and follow up care. Patient ambulatory out of department at this time. 

## 2014-09-18 ENCOUNTER — Encounter (HOSPITAL_COMMUNITY): Payer: Self-pay | Admitting: *Deleted

## 2014-09-18 ENCOUNTER — Emergency Department (HOSPITAL_COMMUNITY)
Admission: EM | Admit: 2014-09-18 | Discharge: 2014-09-18 | Disposition: A | Discharge status: Home / Self Care | Payer: Self-pay | Attending: Emergency Medicine | Admitting: Emergency Medicine

## 2014-09-18 DIAGNOSIS — F10230 Alcohol dependence with withdrawal, uncomplicated: Secondary | ICD-10-CM | POA: Insufficient documentation

## 2014-09-18 DIAGNOSIS — Z79899 Other long term (current) drug therapy: Secondary | ICD-10-CM | POA: Insufficient documentation

## 2014-09-18 DIAGNOSIS — F10930 Alcohol use, unspecified with withdrawal, uncomplicated: Secondary | ICD-10-CM

## 2014-09-18 DIAGNOSIS — Z720 Tobacco use: Secondary | ICD-10-CM | POA: Insufficient documentation

## 2014-09-18 DIAGNOSIS — F319 Bipolar disorder, unspecified: Secondary | ICD-10-CM | POA: Insufficient documentation

## 2014-09-18 LAB — I-STAT CHEM 8, ED
BUN: 10 mg/dL (ref 6–23)
CHLORIDE: 94 mmol/L — AB (ref 96–112)
CREATININE: 0.6 mg/dL (ref 0.50–1.35)
Calcium, Ion: 1.1 mmol/L — ABNORMAL LOW (ref 1.12–1.23)
Glucose, Bld: 183 mg/dL — ABNORMAL HIGH (ref 70–99)
HCT: 42 % (ref 39.0–52.0)
Hemoglobin: 14.3 g/dL (ref 13.0–17.0)
POTASSIUM: 3.9 mmol/L (ref 3.5–5.1)
SODIUM: 132 mmol/L — AB (ref 135–145)
TCO2: 26 mmol/L (ref 0–100)

## 2014-09-18 MED ORDER — CHLORDIAZEPOXIDE HCL 25 MG PO CAPS: ORAL_CAPSULE | ORAL | Status: DC

## 2014-09-18 MED ORDER — LORAZEPAM 0.5 MG PO TABS
0.5000 mg | ORAL_TABLET | Freq: Once | ORAL | Status: AC
  Administered 2014-09-18: 0.5 mg via ORAL
  Filled 2014-09-18: qty 1

## 2014-09-18 MED ORDER — LORAZEPAM 1 MG PO TABS
1.0000 mg | ORAL_TABLET | Freq: Once | ORAL | Status: AC
  Administered 2014-09-18: 1 mg via ORAL
  Filled 2014-09-18: qty 1

## 2014-09-18 NOTE — ED Provider Notes (Signed)
CSN: [id number]     Arrival date & time 09/18/14  0917 History   First MD Initiated Contact with Patient 09/18/14 [phone]     Chief Complaint  Patient presents with  . Alcohol Intoxication  . Questionable seizure     (Consider location/radiation/quality/duration/timing/severity/associated sxs/prior Treatment) Patient is a 44 y.o. male presenting with intoxication. The history is provided by the patient. No language interpreter was used.  Alcohol Intoxication Pertinent negatives include no chills.  Mr. Jordan Davidson is a 44 y.o white male with a history of alcohol intoxication presents after a seizure just prior to arrival in the ED.  He states it occurred on the sidewalk and he had an aura of changing colors in the left eye before it occurred so he was able to lay down on the floor.  He states he was shaking all over and does not know how long he was out for but that no one witnessed it.  He thinks it may have been 10-15 minutes.  He denies any alcohol use in the last 3-4 days but states he usually gets seizures after he stops drinking. He is requesting ativan.  He does not take anti-seizure medication on a regular basis. He denies any fever, shortness of breath, chest pain, abdominal pain, weakness, or injury to his head or extremities.  He denies any suicidal or homicidal ideation.  Past Medical History  Diagnosis Date  . Alcohol abuse   . Depression   . Bipolar 1 disorder, depressed    Past Surgical History  Procedure Laterality Date  . No past surgeries     Family History  Problem Relation Age of Onset  . Hypertension Mother   . Cancer Father   . Heart failure Father    History  Substance Use Topics  . Smoking status: Current Every Day Smoker  . Smokeless tobacco: Not on file  . Alcohol Use: Yes     Comment: daily - "whatever I can get" - 1/5 plus daily    Review of Systems  Constitutional: Negative for chills.  Psychiatric/Behavioral: Negative for suicidal ideas.  All other  systems reviewed and are negative.     Allergies  Review of patient's allergies indicates no known allergies.  Home Medications   Prior to Admission medications   Medication Sig Start Date End Date Taking? Authorizing Provider  chlordiazePOXIDE (LIBRIUM) 25 MG capsule 50mg  PO TID x 1D, then 25-50mg  PO BID X 1D, then 25-50mg  PO QD X 1D 09/18/14   Marleena Shubert Patel-Mills, PA-C  FLUoxetine (PROZAC) 20 MG capsule Take 20 mg by mouth daily.    Historical Provider, MD  gabapentin (NEURONTIN) 300 MG capsule Take 300 mg by mouth 3 (three) times daily.    Historical Provider, MD  OLANZapine (ZYPREXA) 10 MG tablet Take 10 mg by mouth at bedtime.    Historical Provider, MD  Soft Lens Products (RENU MULTIPLUS LUB/REWETTING) SOLN Place 1 drop into both eyes 4 (four) times daily as needed (dry eyes).    Historical Provider, MD  traZODone (DESYREL) 100 MG tablet Take 100 mg by mouth at bedtime.    Historical Provider, MD   BP 143/87 mmHg  Pulse 108  Temp(Src) 98.2 F (36.8 C) (Oral)  Resp 16  SpO2 94% Physical Exam  Constitutional: He is oriented to person, place, and time. He appears well-developed and well-nourished.  He is laying in bed comfortably without any difficulty breathing or shaking.   HENT:  Head: Normocephalic and atraumatic.  Eyes: Conjunctivae are normal.  Neck: Normal range of motion. Neck supple.  Cardiovascular: Normal rate, regular rhythm and normal heart sounds.   Pulmonary/Chest: Effort normal and breath sounds normal.  Abdominal: Soft. There is no tenderness.  Musculoskeletal: Normal range of motion.  Neurological: He is alert and oriented to person, place, and time.  Skin: Skin is warm and dry.  No obvious signs of injury.  No abrasions or lacerations to the head, face, or neck.  No tongue or lip laceration.    Nursing note and vitals reviewed.   ED Course  Procedures (including critical care time) Labs Review Labs Reviewed  I-STAT CHEM 8, ED - Abnormal; Notable for  the following:    Sodium 132 (*)    Chloride 94 (*)    Glucose, Bld 183 (*)    Calcium, Ion 1.10 (*)    All other components within normal limits    Imaging Review No results found.   EKG Interpretation None      MDM   Final diagnoses:  Alcohol withdrawal, uncomplicated   Patient is a known for multiple alcohol intoxication visits and is here for seizure and shaking. He states the seizure occurred outside on the sidewalk and was unwitnessed.  He said he has an aura prior to the seizure and that he was shaking all over but was able to lay flat so he did not injure himself. He is requesting ativan for shaking. He does not take any anti-seizure medications on a regular basis. He does not appear intoxicated.   His physical exam is normal.  His vitals are stable.  I have given him ativan here and his chemistries are reasonable.  I will give him Librium to go home with.  He verbally agrees with the plan.       Catha Gosselin, PA-C 09/18/14 1040  Tilden Fossa, MD 09/18/14 1450
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
## 2014-09-18 NOTE — Discharge Instructions (Signed)
Alcohol Withdrawal Alcohol withdrawal happens when you normally drink alcohol a lot and suddenly stop drinking. Alcohol withdrawal symptoms can be mild to very bad. Mild withdrawal symptoms can include feeling sick to your stomach (nauseous), headache, or feeling irritable. Bad withdrawal symptoms can include shakiness, being very nervous (anxious), and not thinking clearly.  HOME CARE  Join an alcohol support group.  Stay away from people or situations that make you want to drink.  Eat a healthy diet. Eat a lot of fresh fruits, vegetables, and lean meats. GET HELP RIGHT AWAY IF:   You become confused. You start to see and hear things that are not really there.  You feel your heart beating very fast.  You throw up (vomit) blood or cannot stop throwing up. This may be bright red or look like black coffee grounds.  You have blood in your poop (stool). This may be bright red, maroon colored, or black and tarry.  You are lightheaded or pass out (faint).  You develop a fever. MAKE SURE YOU:   Understand these instructions.  Will watch your condition.  Will get help right away if you are not doing well or get worse. Document Released: 11/29/2007 Document Revised: 09/04/2011 Document Reviewed: 11/29/2007 [facility] Patient Information 2015 [place], [place]. This information is not intended to replace advice given to you by your health care provider. Make sure you discuss any questions you have with your health care provider.  Emergency Department Resource Guide 1) Find a Doctor and Pay Out of Pocket Although you won't have to find out who is covered by your insurance plan, it is a good idea to ask around and get recommendations. You will then need to call the office and see if the doctor you have chosen will accept you as a new patient and what types of options they offer for patients who are self-pay. Some doctors offer discounts or will set up payment plans for their patients who do not  have insurance, but you will need to ask so you aren't surprised when you get to your appointment.  2) Contact Your Local Health Department Not all health departments have doctors that can see patients for sick visits, but many do, so it is worth a call to see if yours does. If you don't know where your local health department is, you can check in your phone book. The CDC also has a tool to help you locate your state's health department, and many state websites also have listings of all of their local health departments.  3) Find a Walk-in Clinic If your illness is not likely to be very severe or complicated, you may want to try a walk in clinic. These are popping up all over the country in pharmacies, drugstores, and shopping centers. They're usually staffed by nurse practitioners or physician assistants that have been trained to treat common illnesses and complaints. They're usually fairly quick and inexpensive. However, if you have serious medical issues or chronic medical problems, these are probably not your best option.  No Primary Care Doctor: - Call Health Connect at  [phone] - they can help you locate a primary care doctor that  accepts your insurance, provides certain services, etc. - Physician Referral Service- [phone]  Chronic Pain Problems: Organization         Address  Phone   Notes  [facility]  [phone] Patients need to be referred by their primary care doctor.   Medication Assistance: Organization  Address  Phone   Notes  [facility] Medication [facility] [address] [place], [place] [address] [phone] --Must be a resident of [facility] -- Must have NO insurance coverage whatsoever (no Medicaid/ Medicare, etc.) -- The pt. MUST have a primary care doctor that directs their care regularly and follows them in the community   MedAssist  [phone]   [employer]  [phone]    Agencies that  provide inexpensive medical care: Organization         Address  Phone   Notes  [place]  [phone]   [facility]    [phone]   [facility] [place], [place] [address] [phone]   [place] [address], [place] [phone]   Planned Parenthood    [phone]   [facility]    [phone]   [place] and [place] [address], [place] Phone:  [phone], Fax:  [phone] Hours of Operation:  9 am - 6 pm, M-F.  Also accepts Medicaid/Medicare and self-pay.  [facility] for [place] [address], [address], [place] Phone: [phone], Fax: [phone]. Hours of Operation:  8:30 am - 5:30 pm, M-F.  Also accepts Medicaid and self-pay.  [facility] [address], [place] Phone: [phone]   [place], [place], [place] [phone], Ext. [phone] Mondays & Thursdays: 7-9 AM.  First 15 patients are seen on a first come, first serve basis.    [place] Providers:  Organization         Address  Phone   Notes  [facility] [place] [address],  [phone] Also accepts self-pay patients.  [facility] [address], [place]  [phone]   [place], [address], [place] [phone]   [facility] [address] [place] [phone]   Lucianne Lei [address], [place]   [phone] Only accepts [place] Access [place] patients after they have their name applied to their card.   Self-Pay (no insurance) in [facility]:  Organization         Address  Phone   Notes  Sickle Cell Patients, [facility] [place] [phone]   [facility]  Urgent Care [place] [phone]   [facility] [place]  [place], [place], [place] [phone]   [facility]/Dr. Osei-Bonsu  [address], [place] or [address], [place] [phone] Phone number for both [place] and [place] locations is the same.  Urgent Medical and [facility] [address], [place] [phone]   [facility] [address], [place] or [address] [phone]   [facility] [address], [place] [phone], phone; [phone], fax Sees patients 1st and 3rd Saturday of every month.  Must not qualify for public or private insurance (i.e. Medicaid, Medicare, [place] Health Choice, Veterans' Benefits)  Household income should be no more than 200% of the poverty level The clinic cannot treat you if you are pregnant or think you  are pregnant  Sexually transmitted diseases are not treated at the clinic.    Dental Care: Organization         Address  Phone  Notes  [facility] Department of [facility] [place] [phone] Accepts children up to age 75 who are enrolled in [place] or [place] [employer] [place]; pregnant women with a Medicaid card; and children who have applied for Medicaid or [place] Health Choice, but were declined, whose parents can pay a reduced fee at time of service.  [facility] Department of [facility]  [address], [place] [phone] Accepts children up to age 65 who are enrolled in [place] or [place]; pregnant women with a Medicaid card; and children who have applied for Medicaid or [place] Health Choice, but were declined, whose parents can pay a reduced fee at time of service.  [place] Adult [facility] Access PROGRAM  [place] [phone] Patients are seen by appointment only. Walk-ins  are not accepted. [place] will see patients 53 years of age and older. Monday - Tuesday (8am-5pm) Most Wednesdays (8:30-5pm) $30 per visit, cash only  [facility] Adult Dental Access PROGRAM  [address], [facility] [phone] Patients are seen by appointment only. Walk-ins are not accepted. [place] will see patients 42 years of age and older. One Wednesday Evening (Monthly: Volunteer Based).  $30 per visit, cash only  [place]  [phone] for adults; Children under age 26, call Graduate Pediatric Dentistry at [phone]. Children aged 58-14, please call [phone] to request a pediatric application.  Dental services are provided in all areas of dental care including fillings, crowns and bridges, complete and partial dentures, implants, gum treatment, root canals, and extractions. Preventive care is also provided. Treatment is provided to both adults and children. Patients are selected via a lottery and there is often a waiting list.   [facility] [address], [place]  [phone] [email or web address]   [facility] [address] [place], [place] [phone], Ext. [phone] Second and Fourth Thursday of each month, opens at 6:30 AM; Clinic ends at 9 AM.  Patients are seen on a first-come first-served basis, and a limited number are seen during each clinic.   [facility]  [address] Hillard Danker [place], [place] [phone]   Eligibility Requirements You must have lived in [place], [place], or [place] counties for at least the last three months.   You cannot be eligible for state or federal sponsored [employer], including [employer], [place], or [employer].   You generally cannot be eligible for healthcare insurance through your employer.    How to apply: Eligibility screenings are held every Tuesday and Wednesday afternoon from 1:00 pm until 4:00 pm. You do not need an appointment  for the interview!  [facility] [address], [place], [place]   [place]  [place] Department  [place]  [phone]    Behavioral Health Resources in the Community: Intensive Outpatient Programs Organization         Address  Phone  Notes  [place]. [address], [place], [place] [phone]   [facility] Outpatient [address] [place], [place]   ADS: Alcohol & Drug Svcs [address]  Dr, [place], [place]   [place] [address]  [place], [place] or [phone]   Substance Abuse Resources Organization         Address  Phone  Notes  Alcohol and Drug Services  [phone]   [place]  [phone]   [facility] [place]   Chinita Pester  [phone]   Residential & Outpatient Substance Abuse Program  [phone]   Psychological Services Organization         Address  Phone  Notes  [facility] [place]  [place]  [phone]   [place] [address] [place] or [phone]    Mobile Crisis Teams Organization         Address  Phone  Notes  [facility], Mobile Crisis Care Unit  [phone]   [facility]  [address] [place], [place]   [place] [address], [place] [phone]    Self-Help/Support Groups Organization         Address  Phone             Notes  [place]. of [place] - variety of support groups  [place] Call for more information  Narcotics Anonymous (NA), [facility]aring [facility] [address], [employer] [place]  2 meetings at this location   Special educational needs teacher         Address  Phone  Notes  ASAP Residential  Treatment [place],    [place]  [phone]   [facility]  [address], [place] [phone], [place], [place]   [place], [place] [phone] Admissions: 8am-3pm M-F  Incentives Substance [place] [address]    [place], [place] [phone]   [facility] [address] [place], [place], [place]   [facility] [address]  [place], [place]   [facility] - Intensive Outpatient [address], [place], [place]   [facility] ([place].,  [place], [place] [phone] or [phone]   [facility] Services (RTS) [address] [place], [place] Accepts Medicaid  [place] [address]  [place] [phone] Substance Abuse/Addiction Treatment   [facility] Organization         Address  Phone  Notes  [facility]  [phone]   Domenic Schwab, PhD [address] Arlis [place], [place]   [phone] or [phone]   [place], [place] [phone]   [facility] [address], [place], [place] [phone] Insurance/Medicaid/sponsorship through [facility] and Families [address], [address] [place]                                    [place], [place] [phone] [place] [address], [place] [phone]    Dr. Adele Schilder  [phone]   [facility] Dept. 1) [address], [place] 2) [place] 3)  [place] [address], [place] [phone]  [phone]   [place] [phone])  [phone] or [phone] (After Hours)    ° ° ° °
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
## 2014-10-18 ENCOUNTER — Emergency Department (HOSPITAL_COMMUNITY)
Admission: EM | Admit: 2014-10-18 | Discharge: 2014-10-18 | Disposition: A | Discharge status: Home / Self Care | Payer: Self-pay | Attending: Emergency Medicine | Admitting: Emergency Medicine

## 2014-10-18 ENCOUNTER — Encounter (HOSPITAL_COMMUNITY): Payer: Self-pay | Admitting: Emergency Medicine

## 2014-10-18 DIAGNOSIS — S50311A Abrasion of right elbow, initial encounter: Secondary | ICD-10-CM | POA: Insufficient documentation

## 2014-10-18 DIAGNOSIS — Y999 Unspecified external cause status: Secondary | ICD-10-CM | POA: Insufficient documentation

## 2014-10-18 DIAGNOSIS — F319 Bipolar disorder, unspecified: Secondary | ICD-10-CM | POA: Insufficient documentation

## 2014-10-18 DIAGNOSIS — Z720 Tobacco use: Secondary | ICD-10-CM | POA: Insufficient documentation

## 2014-10-18 DIAGNOSIS — Y929 Unspecified place or not applicable: Secondary | ICD-10-CM | POA: Insufficient documentation

## 2014-10-18 DIAGNOSIS — Z79899 Other long term (current) drug therapy: Secondary | ICD-10-CM | POA: Insufficient documentation

## 2014-10-18 DIAGNOSIS — W01198A Fall on same level from slipping, tripping and stumbling with subsequent striking against other object, initial encounter: Secondary | ICD-10-CM | POA: Insufficient documentation

## 2014-10-18 DIAGNOSIS — Y939 Activity, unspecified: Secondary | ICD-10-CM | POA: Insufficient documentation

## 2014-10-18 NOTE — ED Notes (Signed)
Pt smells of ETOH usage, per pt he cannot remember how much he had to drink. While trying to assess pt he states "just let me sleep." will not answer any questions, pt just repeats that he wants to sleep.

## 2014-10-18 NOTE — Discharge Instructions (Signed)
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
## 2014-10-21 ENCOUNTER — Encounter (HOSPITAL_COMMUNITY): Payer: Self-pay | Admitting: *Deleted

## 2014-10-21 ENCOUNTER — Emergency Department (HOSPITAL_COMMUNITY)
Admission: EM | Admit: 2014-10-21 | Discharge: 2014-10-21 | Disposition: A | Discharge status: Home / Self Care | Payer: Self-pay | Attending: Emergency Medicine | Admitting: Emergency Medicine

## 2014-10-21 DIAGNOSIS — Z720 Tobacco use: Secondary | ICD-10-CM | POA: Insufficient documentation

## 2014-10-21 DIAGNOSIS — F10230 Alcohol dependence with withdrawal, uncomplicated: Secondary | ICD-10-CM | POA: Insufficient documentation

## 2014-10-21 DIAGNOSIS — Z79899 Other long term (current) drug therapy: Secondary | ICD-10-CM | POA: Insufficient documentation

## 2014-10-21 DIAGNOSIS — F319 Bipolar disorder, unspecified: Secondary | ICD-10-CM | POA: Insufficient documentation

## 2014-10-21 NOTE — ED Provider Notes (Signed)
CSN: [id number]     Arrival date & time 10/21/14  2009 History   First MD Initiated Contact with Patient 10/21/14 2045     Chief Complaint  Patient presents with  . Alcohol Intoxication     (Consider location/radiation/quality/duration/timing/severity/associated sxs/prior Treatment) The history is provided by the patient and medical records. No language interpreter was used.     Jordan Davidson is a 44 y.o. male  with a hx of EtOH abuse, depression, Bipolar disorder presents to the Emergency Department via EMS after being found sleeping behind a [employer] restaurant.  Per EMS this is the 2nd time they have been called out today due to the patient sleeping in public space. Patient reports he has had a lot of alcohol to drink tonight however he is unable to quantify this for me. He reports he has been drinking beer and liquor.  He denies all somatic symptoms and all pain.  He denies falling or hitting his head.  Patient does not endorse suicidal or homicidal ideations.  Past Medical History  Diagnosis Date  . Alcohol abuse   . Depression   . Bipolar 1 disorder, depressed    Past Surgical History  Procedure Laterality Date  . No past surgeries     Family History  Problem Relation Age of Onset  . Hypertension Mother   . Cancer Father   . Heart failure Father    History  Substance Use Topics  . Smoking status: Current Every Day Smoker  . Smokeless tobacco: Not on file  . Alcohol Use: Yes     Comment: daily - "whatever I can get" - 1/5 plus daily    Review of Systems  Constitutional: Negative for fever, diaphoresis, appetite change, fatigue and unexpected weight change.  HENT: Negative for mouth sores.   Eyes: Negative for visual disturbance.  Respiratory: Negative for cough, chest tightness, shortness of breath and wheezing.   Cardiovascular: Negative for chest pain.  Gastrointestinal: Negative for nausea, vomiting, abdominal pain, diarrhea and constipation.  Endocrine: Negative for  polydipsia, polyphagia and polyuria.  Genitourinary: Negative for dysuria, urgency, frequency and hematuria.  Musculoskeletal: Negative for back pain and neck stiffness.  Skin: Negative for rash.  Allergic/Immunologic: Negative for immunocompromised state.  Neurological: Negative for syncope, light-headedness and headaches.  Hematological: Does not bruise/bleed easily.      Allergies  Review of patient's allergies indicates no known allergies.  Home Medications   Prior to Admission medications   Medication Sig Start Date End Date Taking? Authorizing Provider  chlordiazePOXIDE (LIBRIUM) 25 MG capsule 50mg  PO TID x 1D, then 25-50mg  PO BID X 1D, then 25-50mg  PO QD X 1D 09/18/14   Hanna Patel-Mills, PA-C  FLUoxetine (PROZAC) 20 MG capsule Take 20 mg by mouth daily.    Historical Provider, MD  gabapentin (NEURONTIN) 300 MG capsule Take 300 mg by mouth 3 (three) times daily.    Historical Provider, MD  OLANZapine (ZYPREXA) 10 MG tablet Take 10 mg by mouth at bedtime.    Historical Provider, MD  Soft Lens Products (RENU MULTIPLUS LUB/REWETTING) SOLN Place 1 drop into both eyes 4 (four) times daily as needed (dry eyes).    Historical Provider, MD  traZODone (DESYREL) 100 MG tablet Take 100 mg by mouth at bedtime.    Historical Provider, MD   BP 110/61 mmHg  Pulse 94  Temp(Src) 97.9 F (36.6 C)  Resp 16  SpO2 93% Physical Exam  Constitutional: He appears well-developed and well-nourished. No distress.  Awake, alert, nontoxic  appearance  HENT:  Head: Normocephalic and atraumatic.  Mouth/Throat: Oropharynx is clear and moist. No oropharyngeal exudate.  Eyes: Conjunctivae are normal. No scleral icterus.  Neck: Normal range of motion. Neck supple.  Cardiovascular: Normal rate, regular rhythm, normal heart sounds and intact distal pulses.   Pulmonary/Chest: Effort normal and breath sounds normal. No respiratory distress. He has no wheezes.  Equal chest expansion  Abdominal: Soft. Bowel  sounds are normal. He exhibits no mass. There is no tenderness. There is no rebound and no guarding.  Soft and nontender  Musculoskeletal: Normal range of motion. He exhibits no edema.  Neurological: He is alert.  Speech is clear and goal oriented Moves extremities without ataxia Patient ambulates with steady gait  Skin: Skin is warm and dry. He is not diaphoretic.  Multiple healing abrasions noted to the legs and arms; no open wounds or new abrasions  Psychiatric: He has a normal mood and affect.  Nursing note and vitals reviewed.   ED Course  Procedures (including critical care time) Labs Review Labs Reviewed - No data to display  Imaging Review No results found.   EKG Interpretation None      MDM   Final diagnoses:  Alcohol dependence with uncomplicated withdrawal   Jordan Jensen presents with intoxication, but no somatic symptoms.    Patient ambulates here in the emergency department without difficulty and with steady gait and normal balance. He is intoxicated at this time but is not incapacitated.  We'll discharge.  When discussed with the patient our plan he became frustrated and ambulated out of the ED without assistance.  BP 110/61 mmHg  Pulse 94  Temp(Src) 97.9 F (36.6 C)  Resp 16  SpO2 93%    Dierdre Forth, PA-C 10/21/14 2124  Layla Maw Ward, DO 10/21/14 2333

## 2014-10-21 NOTE — ED Notes (Signed)
Per EMS, pt was found by a bystander sleeping behind a [employer] restaurant. Pt states he has been drinking alcohol today, but states he does not know how much. EMS states the pt was found earlier today sleeping in someone's yard, but was not brought in at the time. CBG 116

## 2014-10-21 NOTE — ED Notes (Signed)
[facility] Expected date:  Expected time:  Means of arrival:  Comments: EMS/intoxicated

## 2014-10-21 NOTE — Discharge Instructions (Signed)
1. Medications: usual home medications 2. Treatment: rest, drink plenty of fluids,  3. Follow Up: Please followup with your primary doctor in 2-3 days for discussion of your diagnoses and further evaluation after today's visit; if you do not have a primary care doctor use the resource guide provided to find one;     Emergency Department Resource Guide 1) Find a Doctor and Pay Out of Pocket Although you won't have to find out who is covered by your insurance plan, it is a good idea to ask around and get recommendations. You will then need to call the office and see if the doctor you have chosen will accept you as a new patient and what types of options they offer for patients who are self-pay. Some doctors offer discounts or will set up payment plans for their patients who do not have insurance, but you will need to ask so you aren't surprised when you get to your appointment.  2) Contact Your Local Health Department Not all health departments have doctors that can see patients for sick visits, but many do, so it is worth a call to see if yours does. If you don't know where your local health department is, you can check in your phone book. The CDC also has a tool to help you locate your state's health department, and many state websites also have listings of all of their local health departments.  3) Find a Walk-in Clinic If your illness is not likely to be very severe or complicated, you may want to try a walk in clinic. These are popping up all over the country in pharmacies, drugstores, and shopping centers. They're usually staffed by nurse practitioners or physician assistants that have been trained to treat common illnesses and complaints. They're usually fairly quick and inexpensive. However, if you have serious medical issues or chronic medical problems, these are probably not your best option.  No Primary Care Doctor: - Call Health Connect at  [phone] - they can help you locate a primary  care doctor that  accepts your insurance, provides certain services, etc. - Physician Referral Service- [phone]  Chronic Pain Problems: Organization         Address  Phone   Notes  [facility]  [phone] Patients need to be referred by their primary care doctor.   Medication Assistance: Organization         Address  Phone   Notes  [facility] [address] [place], [place] [address] [phone] --Must be a resident of [facility] -- Must have NO insurance coverage whatsoever (no Medicaid/ Medicare, etc.) -- The pt. MUST have a primary care doctor that directs their care regularly and follows them in the community   MedAssist  [phone]   [employer]  [phone]    Agencies that provide inexpensive medical care: Organization         Address  Phone   Notes  [facility]  [phone]   [facility]    [phone]   [facility] [address] [place], [place] [address] [phone]   [facility] [address], [place] [phone]   [facility]anned Parenthood    [phone]   [facility]    [phone]   [facility]  [address], [place] Phone:  [phone], Fax:  [phone]  Hours of Operation:  9 am - 6 pm, M-F.  Also accepts Medicaid/Medicare and self-pay.  [facility] for [place], [address], [place] Phone: [phone], Fax: [phone]. Hours of Operation:  8:30 am - 5:30 pm, M-F.  Also accepts Medicaid and self-pay.  [facility] [place] [address] [place] Phone: [phone]   [place], [place], [place] [phone], Ext. [phone] Mondays & Thursdays: 7-9 AM.  First 15 patients are seen on a first come, first serve basis.    [place]  Providers:  Organization         Address  Phone   Notes  [facility] [address], [place] [phone] Also accepts self-pay patients.  [facility] [address], [place]  [phone]   [place], [address], [place] [phone]   [facility] [address] [place] [phone]   Lucianne Lei [address], [place]   [phone] Only accepts [place] Access [place] patients after they have their name applied to their card.   Self-Pay (no insurance) in [facility]:  Organization         Address  Phone   Notes  Sickle Cell Patients, [facility] [place] [phone]   [facility] [place] [phone]   [facility] [place]  [place], [place], [place] [phone]   [facility]/Dr. Osei-Bonsu  [address] [place] or [address], [place] [phone] Phone number for both [place] and [place] locations is the same.  [facility] [address] [place] [phone]   [facility] [address] [place] or [address] [phone]   [facility] [address], [place] [phone], phone; [phone], fax Sees patients 1st and 3rd Saturday of every month.  Must not qualify for public or private insurance (i.e. Medicaid, Medicare, [place] Health Choice, Veterans' Benefits)  Household income should be no more than 200% of the poverty level The clinic cannot treat you if you are pregnant or think you are pregnant  Sexually transmitted diseases are not treated at the clinic.    Dental Care: Organization         Address  Phone  Notes  [facility] [place] [phone] Accepts children up to age 74 who are enrolled in [place] or [place]; pregnant women with a Medicaid card; and children who have applied for Medicaid or [place] Health Choice, but were declined, whose parents can pay a reduced fee at time of service.  [facility]  [address], [place] [phone] Accepts children up to age 32 who are enrolled in [place] or [place]; pregnant women with a Medicaid card; and children who have applied for Medicaid or [place] Health Choice, but were declined, whose parents can pay a reduced fee at time of service.  [facility] PRO[facility]  [place] [phone] Patients are seen by appointment only. Walk-ins are not accepted. [place] will  see patients 5 years of age and older. Monday - Tuesday (8am-5pm) Most Wednesdays (8:30-5pm) $30 per visit, cash only  [facility] Access [facility]  [address], [facility] [phone] Patients are seen by appointment only. Walk-ins are not accepted. [place] will see patients 73 years of age and older. One Wednesday Evening (Monthly: Volunteer Based).  $30 per visit, cash only  [place]  [phone] for adults; Children under age 58, call Graduate Pediatric Dentistry at [phone]. Children aged 68-14, please call [phone] to request a pediatric application.  Dental services are provided in all areas of dental care including fillings, crowns and bridges, complete and partial dentures, implants, gum treatment, root canals, and extractions. Preventive care is also provided. Treatment is provided to both adults and children. Patients are selected via a lottery and there is often a waiting list.   [facility] [address] [place]  [phone] [email or web address]   [facility]  [address] [place], [place] [phone], Ext. [phone] Second and Fourth Thursday of each month, opens at 6:30 AM; Clinic ends at 9 AM.  Patients are seen on a first-come first-served basis, and a limited number are seen during each clinic.   [facility]  [address] Hillard Danker [place], [place] [phone]   Eligibility Requirements You must have lived in [place], [place], or [place] counties for at least the last three months.   You cannot be eligible for state or federal sponsored [employer], including [employer], [place], or [employer].   You generally cannot be eligible for healthcare insurance through your employer.    How to apply: Eligibility screenings are held every Tuesday and Wednesday afternoon from 1:00 pm until 4:00 pm. You do not need an appointment for the interview!  [facility] [address] [place], [place]   [place]  [place] Department  [place]  [phone]    Behavioral Health Resources in the Community: Intensive Outpatient Programs Organization         Address  Phone  Notes  [place]. [address] [place], [place] [phone]   [facility] Outpatient [address] [place], [place]   ADS: Alcohol & Drug Svcs [address] [place], [place]   [place] [address]  [place], [place] or [phone]   Substance Abuse Resources Organization         Address  Phone  Notes  Alcohol and Drug Services  [phone]   [place]  [phone]   [facility] [place]   Chinita Pester  [phone]   Residential & Outpatient Substance Abuse Program  [phone]   Psychological Services Organization         Address  Phone  Notes  [facility]  [place]  [phone]   [place] [address] [place] or [phone]    Mobile Crisis Teams Organization         Address  Phone  Notes  [facility] Alternatives, Mobile Crisis Care Unit  [phone]   [facility]ive Psychotherapeutic Services  [address] [place], [place]   [facility] [address] [place] [phone]    Self-Help/Support Groups Organization  Address  Phone             Notes  [place]. of [place] - variety of support groups  [place] Call for more information  Narcotics Anonymous (NA), [facility]aring [facility] [address], [employer] [place]  2 meetings at this location   Special educational needs teacher         Address  Phone  Notes  ASAP Residential Treatment [place],    [place]  [phone]   [facility]  [address], [place] [address]9924, [place], [place]   [place], [place] [phone] Admissions: 8am-3pm M-F  Incentives Substance [place] [address],    [place], [place] [phone]   [facility] [address] [place], [place], [place]   [facility] [address],  [place], [place]   [facility] Programs - Intensive Outpatient [address] Kristeen [address], [place], [place]   [facility] ([place]  [place], [place] [phone] or [phone]   [facility] Services (RTS) [address] [place], [place] Accepts Medicaid  [facility] [place] [address]  [place] [phone] Substance Abuse/Addiction Treatment   [facility] Organization         Address  Phone  Notes  [facility]  [phone]   Domenic Schwab, PhD [address] Arlis Porta [place], [place]   [phone] or [phone]    [place], [place] [phone]   [facility] [address], [place], [place] [phone] Insurance/Medicaid/sponsorship through [facility] and Families [address], [address]                                    [place], [place] [phone] [place] [address], [place] [phone]    Dr. Adele Schilder  [phone]   [facility] Dept. 1) [address], [place] 2) [place] 3)  [place] [address], [place] [phone]  [phone]   [place] [phone] or [phone] (After Hours)

## 2014-10-22 ENCOUNTER — Emergency Department (HOSPITAL_COMMUNITY)
Admission: EM | Admit: 2014-10-22 | Discharge: 2014-10-22 | Discharge status: Against Medical Advice | Payer: Self-pay | Attending: Emergency Medicine | Admitting: Emergency Medicine

## 2014-10-22 ENCOUNTER — Encounter (HOSPITAL_COMMUNITY): Payer: Self-pay | Admitting: *Deleted

## 2014-10-22 DIAGNOSIS — Y908 Blood alcohol level of 240 mg/100 ml or more: Secondary | ICD-10-CM | POA: Insufficient documentation

## 2014-10-22 DIAGNOSIS — F1010 Alcohol abuse, uncomplicated: Secondary | ICD-10-CM | POA: Insufficient documentation

## 2014-10-22 DIAGNOSIS — Z720 Tobacco use: Secondary | ICD-10-CM | POA: Insufficient documentation

## 2014-10-22 DIAGNOSIS — Z79899 Other long term (current) drug therapy: Secondary | ICD-10-CM | POA: Insufficient documentation

## 2014-10-22 DIAGNOSIS — F319 Bipolar disorder, unspecified: Secondary | ICD-10-CM | POA: Insufficient documentation

## 2014-10-22 DIAGNOSIS — I10 Essential (primary) hypertension: Secondary | ICD-10-CM | POA: Insufficient documentation

## 2014-10-22 LAB — I-STAT CHEM 8, ED
BUN: 15 mg/dL (ref 6–23)
CALCIUM ION: 0.99 mmol/L — AB (ref 1.12–1.23)
CHLORIDE: 100 mmol/L (ref 96–112)
CREATININE: 1.2 mg/dL (ref 0.50–1.35)
Glucose, Bld: 91 mg/dL (ref 70–99)
HCT: 44 % (ref 39.0–52.0)
Hemoglobin: 15 g/dL (ref 13.0–17.0)
Potassium: 4 mmol/L (ref 3.5–5.1)
Sodium: 141 mmol/L (ref 135–145)
TCO2: 23 mmol/L (ref 0–100)

## 2014-10-22 LAB — ETHANOL: Alcohol, Ethyl (B): 380 mg/dL — ABNORMAL HIGH (ref 0–9)

## 2014-10-22 NOTE — ED Notes (Signed)
Per EMS they were contacted by [employer], pt was behind [facility] [address]. Pt served with trespassing charge. He has been drinking mouth wash, pt not ambulatory but has been a&ox4. [phone]/89,92,18

## 2014-10-22 NOTE — ED Provider Notes (Signed)
CSN: [id number]     Arrival date & time 10/22/14  1725 History   First MD Initiated Contact with Patient 10/22/14 1743     Chief Complaint  Patient presents with  . Alcohol Intoxication     (Consider location/radiation/quality/duration/timing/severity/associated sxs/prior Treatment) HPI   Level V caveat- ETOH intoxication  PCP: No PCP Per Patient Blood pressure 133/81, pulse 88, temperature 98.2 F (36.8 C), temperature source Oral, resp. rate 18, SpO2 94 %.  Jordan Davidson is a 44 y.o.male with a significant PMH of hypertension, depression, bipolar disorder presents to the ER by EMS with complaints of ETOH. The patient was seen two times yesterday for the same thing. He wakes up and tells me he has been drinking liquor all day. Denies SI/HI. Requests food. Denies any complaints. Pt was resting and had to be woken up for HPI.   Past Medical History  Diagnosis Date  . Alcohol abuse   . Depression   . Bipolar 1 disorder, depressed    Past Surgical History  Procedure Laterality Date  . No past surgeries     Family History  Problem Relation Age of Onset  . Hypertension Mother   . Cancer Father   . Heart failure Father    History  Substance Use Topics  . Smoking status: Current Every Day Smoker  . Smokeless tobacco: Not on file  . Alcohol Use: Yes     Comment: daily - "whatever I can get" - 1/5 plus daily    Review of Systems  Level V caveat- ETOH intoxication  Allergies  Review of patient's allergies indicates no known allergies.  Home Medications   Prior to Admission medications   Medication Sig Start Date End Date Taking? Authorizing Provider  chlordiazePOXIDE (LIBRIUM) 25 MG capsule  PO TID x 1D, then 25-50mg  PO BID X 1D, then 25-50mg  PO QD X 1D 09/18/14   Hanna Patel-Mills, PA-C  FLUoxetine (PROZAC) 20 MG capsule Take 20 mg by mouth daily.    Historical Provider, MD  gabapentin (NEURONTIN) 300 MG capsule Take 300 mg by mouth 3 (three) times daily.     Historical Provider, MD  OLANZapine (ZYPREXA) 10 MG tablet Take 10 mg by mouth at bedtime.    Historical Provider, MD  Soft Lens Products (RENU MULTIPLUS LUB/REWETTING) SOLN Place 1 drop into both eyes 4 (four) times daily as needed (dry eyes).    Historical Provider, MD  traZODone (DESYREL) 100 MG tablet Take 100 mg by mouth at bedtime.    Historical Provider, MD   BP 133/81 mmHg  Pulse 88  Temp(Src) 98.2 F (36.8 C) (Oral)  Resp 18  SpO2 94% Physical Exam  Constitutional: He appears well-developed and well-nourished. No distress.  HENT:  Head: Normocephalic and atraumatic.  Eyes: Pupils are equal, round, and reactive to light.  Neck: Normal range of motion. Neck supple.  Cardiovascular: Normal rate and regular rhythm.   Pulmonary/Chest: Effort normal.  Abdominal: Soft.  Neurological: He is alert.  Skin: Skin is warm and dry.  Psychiatric: His affect is angry. He is agitated. He expresses no homicidal and no suicidal ideation.  Intoxicated but does not slur his words when speaking and able to ambulate without assistance.  Nursing note and vitals reviewed.   ED Course  Procedures (including critical care time) Labs Review Labs Reviewed  ETHANOL - Abnormal; Notable for the following:    Alcohol, Ethyl (B) 380 (*)    All other components within normal limits  I-STAT CHEM 8, ED -  Abnormal; Notable for the following:    Calcium, Ion 0.99 (*)    All other components within normal limits    Imaging Review No results found.   EKG Interpretation None      MDM   Final diagnoses:  ETOH abuse    ETOH is 380, i-stat chem 8 is non acute. The nurse informed me that the  patient requested a food tray and when given a sandwich instead of a hot meal he walked out AMA. Pt already off the premises when I am made aware that he has left.  Filed Vitals:   10/22/14 1734  BP: 133/81  Pulse: 88  Temp: 98.2 F (36.8 C)  Resp: [address] [place] [address]18       India Jolin, PA-C 10/22/14  1930  Shon Batonourtney F Horton, MD 10/23/14 2005

## 2014-10-22 NOTE — ED Notes (Signed)
Pt left AMA. Followed pt outside to ambulance bay. He stated "im not coming back in" Pt made aware if he was able to wait a short time DC instructions could be prepared. He declined

## 2014-10-23 ENCOUNTER — Emergency Department (HOSPITAL_COMMUNITY)
Admission: EM | Admit: 2014-10-23 | Discharge: 2014-10-23 | Disposition: A | Discharge status: Home / Self Care | Payer: Self-pay | Attending: Emergency Medicine | Admitting: Emergency Medicine

## 2014-10-23 ENCOUNTER — Emergency Department
Admit: 2014-10-23 | Disposition: A | Discharge status: Home / Self Care | Payer: Self-pay | Admitting: Emergency Medicine

## 2014-10-23 ENCOUNTER — Emergency Department: Admit: 2014-10-23 | Discharge status: Against Medical Advice | Payer: Self-pay | Admitting: Emergency Medicine

## 2014-10-23 ENCOUNTER — Encounter (HOSPITAL_COMMUNITY): Payer: Self-pay

## 2014-10-23 DIAGNOSIS — Z720 Tobacco use: Secondary | ICD-10-CM | POA: Insufficient documentation

## 2014-10-23 DIAGNOSIS — Z79899 Other long term (current) drug therapy: Secondary | ICD-10-CM | POA: Insufficient documentation

## 2014-10-23 DIAGNOSIS — F10920 Alcohol use, unspecified with intoxication, uncomplicated: Secondary | ICD-10-CM

## 2014-10-23 DIAGNOSIS — F319 Bipolar disorder, unspecified: Secondary | ICD-10-CM | POA: Insufficient documentation

## 2014-10-23 DIAGNOSIS — F10120 Alcohol abuse with intoxication, uncomplicated: Secondary | ICD-10-CM | POA: Insufficient documentation

## 2014-10-23 LAB — CBC
HCT: 38.4 % — ABNORMAL LOW (ref 40.0–52.0)
HCT: 39.5 % — ABNORMAL LOW (ref 40.0–52.0)
HGB: 13 g/dL (ref 13.0–18.0)
HGB: 13.4 g/dL (ref 13.0–18.0)
MCH: 31.4 pg (ref 26.0–34.0)
MCH: 31.8 pg (ref 26.0–34.0)
MCHC: 33.7 g/dL (ref 32.0–36.0)
MCHC: 34.1 g/dL (ref 32.0–36.0)
MCV: 93 fL (ref 80–100)
MCV: 93 fL (ref 80–100)
PLATELETS: 127 10*3/uL — AB (ref 150–440)
Platelet: 135 10*3/uL — ABNORMAL LOW (ref 150–440)
RBC: 4.13 10*6/uL — AB (ref 4.40–5.90)
RBC: 4.23 10*6/uL — ABNORMAL LOW (ref 4.40–5.90)
RDW: 14.2 % (ref 11.5–14.5)
RDW: 14.6 % — ABNORMAL HIGH (ref 11.5–14.5)
WBC: 8 10*3/uL (ref 3.8–10.6)
WBC: 7.2 10*3/uL (ref 3.8–10.6)

## 2014-10-23 LAB — DRUG SCREEN, URINE
Amphetamines, Ur Screen: NEGATIVE
Barbiturates, Ur Screen: NEGATIVE
Benzodiazepine, Ur Scrn: NEGATIVE
CANNABINOID 50 NG, UR ~~LOC~~: NEGATIVE
Cocaine Metabolite,Ur ~~LOC~~: NEGATIVE
MDMA (ECSTASY) UR SCREEN: NEGATIVE
Methadone, Ur Screen: NEGATIVE
OPIATE, UR SCREEN: NEGATIVE
Phencyclidine (PCP) Ur S: NEGATIVE
Tricyclic, Ur Screen: NEGATIVE

## 2014-10-23 LAB — URINALYSIS, COMPLETE
BACTERIA: NONE SEEN
BILIRUBIN, UR: NEGATIVE
Blood: NEGATIVE
GLUCOSE, UR: NEGATIVE mg/dL (ref 0–75)
KETONE: NEGATIVE
LEUKOCYTE ESTERASE: NEGATIVE
Nitrite: NEGATIVE
Ph: 6 (ref 4.5–8.0)
Protein: NEGATIVE
RBC, UR: NONE SEEN /HPF (ref 0–5)
SQUAMOUS EPITHELIAL: NONE SEEN
Specific Gravity: 1.002 (ref 1.003–1.030)

## 2014-10-23 LAB — COMPREHENSIVE METABOLIC PANEL
ALT: 20 U/L
AST: 54 U/L — AB
Albumin: 3.9 g/dL
Alkaline Phosphatase: 73 U/L
Anion Gap: 13 (ref 7–16)
BUN: 12 mg/dL
Bilirubin,Total: 0.5 mg/dL
CHLORIDE: 100 mmol/L — AB
CREATININE: 0.55 mg/dL — AB
Calcium, Total: 8.6 mg/dL — ABNORMAL LOW
Co2: 25 mmol/L
EGFR (African American): >60
EGFR (Non-African Amer.): >60
Glucose: 129 mg/dL — ABNORMAL HIGH
POTASSIUM: 3.9 mmol/L
Sodium: 138 mmol/L
TOTAL PROTEIN: 6.4 g/dL — AB

## 2014-10-23 LAB — SALICYLATE LEVEL

## 2014-10-23 LAB — ACETAMINOPHEN LEVEL

## 2014-10-23 LAB — ETHANOL: Ethanol: 370 mg/dL

## 2014-10-23 NOTE — ED Notes (Signed)
Bed: [facility] Expected date:  Expected time:  Means of arrival:  Comments: 

## 2014-10-23 NOTE — ED Provider Notes (Signed)
CSN: [id number]     Arrival date & time 10/23/14  0240 History   First MD Initiated Contact with Patient 10/23/14 0248     Chief Complaint  Patient presents with  . Alcohol Intoxication     (Consider location/radiation/quality/duration/timing/severity/associated sxs/prior Treatment) HPI Comments: The patient is well known to the ED with multiple evaluations for alcohol intoxication, returns to the ED after being found intoxicated by EMS, incoherent, in need of medical evaluation. The patient reported to EMS he drank an unknown quantity of Listerine. He is refusing to speak on arrival to the emergency department.   Patient is a 44 y.o. male presenting with intoxication.  Alcohol Intoxication    Past Medical History  Diagnosis Date  . Alcohol abuse   . Depression   . Bipolar 1 disorder, depressed    Past Surgical History  Procedure Laterality Date  . No past surgeries     Family History  Problem Relation Age of Onset  . Hypertension Mother   . Cancer Father   . Heart failure Father    History  Substance Use Topics  . Smoking status: Current Every Day Smoker  . Smokeless tobacco: Not on file  . Alcohol Use: Yes     Comment: daily - "whatever I can get" - 1/5 plus daily    Review of Systems  Unable to perform ROS     Allergies  Review of patient's allergies indicates no known allergies.  Home Medications   Prior to Admission medications   Medication Sig Start Date End Date Taking? Authorizing Provider  chlordiazePOXIDE (LIBRIUM) 25 MG capsule 50mg  PO TID x 1D, then 25-50mg  PO BID X 1D, then 25-50mg  PO QD X 1D 09/18/14   Hanna Patel-Mills, PA-C  FLUoxetine (PROZAC) 20 MG capsule Take 20 mg by mouth daily.    Historical Provider, MD  gabapentin (NEURONTIN) 300 MG capsule Take 300 mg by mouth 3 (three) times daily.    Historical Provider, MD  OLANZapine (ZYPREXA) 10 MG tablet Take 10 mg by mouth at bedtime.    Historical Provider, MD  Soft Lens Products (RENU  MULTIPLUS LUB/REWETTING) SOLN Place 1 drop into both eyes 4 (four) times daily as needed (dry eyes).    Historical Provider, MD  traZODone (DESYREL) 100 MG tablet Take 100 mg by mouth at bedtime.    Historical Provider, MD   BP 123/79 mmHg  Pulse 72  Temp(Src) 97.8 F (36.6 C) (Oral)  Resp 16  SpO2 90% Physical Exam  Constitutional: He appears well-developed and well-nourished.  Awake, responsive, acutely intoxicated.  HENT:  Head: Normocephalic.  Eyes: Conjunctivae are normal.  Neck: Normal range of motion. Neck supple.  Cardiovascular: Normal rate and regular rhythm.   Pulmonary/Chest: Effort normal and breath sounds normal.  Abdominal: Soft. Bowel sounds are normal. There is no tenderness. There is no rebound and no guarding.  Musculoskeletal: Normal range of motion.  Neurological:  Responsive.   Skin: Skin is warm and dry.    ED Course  Procedures (including critical care time) Labs Review Labs Reviewed - No data to display  Imaging Review No results found.   EKG Interpretation None      MDM   Final diagnoses:  Alcohol intoxication, uncomplicated    Care plan for this patient reviewed. Chart reviewed. He was just evaluated by TTS consultation and psychiatric evaluation and found stable for discharge from a psychiatric perspective. VSS. [employer] at bedside that will take patient into custody upon discharge.    Melvenia BeamShari  Hector Shade, PA-C 10/23/14 1610  Devoria Albe, MD 10/23/14 [phone]

## 2014-10-23 NOTE — ED Notes (Signed)
Patient again arrives intoxicated by EMS.  Patient has been seen multiple times over the last 2 days and also had a TTS consult and patient discharged.

## 2014-10-23 NOTE — Discharge Instructions (Signed)
PATIENT TO GO WITH [facility] ON DISCHARGE.

## 2014-10-23 NOTE — ED Notes (Signed)
Bed: [facility] Expected date:  Expected time:  Means of arrival:  Comments: 

## 2014-10-24 ENCOUNTER — Emergency Department (HOSPITAL_COMMUNITY)
Admission: EM | Admit: 2014-10-24 | Discharge: 2014-10-24 | Disposition: A | Discharge status: Home / Self Care | Payer: Self-pay | Attending: Emergency Medicine | Admitting: Emergency Medicine

## 2014-10-24 ENCOUNTER — Encounter (HOSPITAL_COMMUNITY): Payer: Self-pay | Admitting: Emergency Medicine

## 2014-10-24 DIAGNOSIS — F10120 Alcohol abuse with intoxication, uncomplicated: Secondary | ICD-10-CM | POA: Insufficient documentation

## 2014-10-24 DIAGNOSIS — F10920 Alcohol use, unspecified with intoxication, uncomplicated: Secondary | ICD-10-CM

## 2014-10-24 DIAGNOSIS — Z79899 Other long term (current) drug therapy: Secondary | ICD-10-CM | POA: Insufficient documentation

## 2014-10-24 DIAGNOSIS — Z720 Tobacco use: Secondary | ICD-10-CM | POA: Insufficient documentation

## 2014-10-24 DIAGNOSIS — F319 Bipolar disorder, unspecified: Secondary | ICD-10-CM | POA: Insufficient documentation

## 2014-10-24 LAB — COMPREHENSIVE METABOLIC PANEL
ALBUMIN: 3.9 g/dL
ALT: 20 U/L
AST: 50 U/L — AB
Alkaline Phosphatase: 77 U/L
Anion Gap: 10 (ref 7–16)
BUN: 10 mg/dL
Bilirubin,Total: 0.5 mg/dL
CHLORIDE: 101 mmol/L
Calcium, Total: 8.5 mg/dL — ABNORMAL LOW
Co2: 29 mmol/L
Creatinine: 0.59 mg/dL — ABNORMAL LOW
EGFR (African American): >60
EGFR (Non-African Amer.): >60
Glucose: 104 mg/dL — ABNORMAL HIGH
POTASSIUM: 3.5 mmol/L
Sodium: 140 mmol/L
TOTAL PROTEIN: 6.7 g/dL

## 2014-10-24 LAB — ACETAMINOPHEN LEVEL

## 2014-10-24 LAB — ETHANOL: ETHANOL LVL: 353 mg/dL — AB

## 2014-10-24 LAB — SALICYLATE LEVEL: Salicylates, Serum: <4 mg/dL

## 2014-10-24 MED ORDER — CHLORDIAZEPOXIDE HCL 25 MG PO CAPS
ORAL_CAPSULE | ORAL | Status: DC
Start: 2014-10-24 — End: 2015-02-13

## 2014-10-24 NOTE — ED Provider Notes (Signed)
CSN: [id number]     Arrival date & time 10/24/14  1455 History   First MD Initiated Contact with Patient 10/24/14 1547     Chief Complaint  Patient presents with  . Alcohol Intoxication      HPI Patient presents to emergency department complaining of alcohol intoxication and need for detox.  He is well-known to this emergency department for similar symptoms.  He denies seizure.  No chest pain abdominal pain.  Denies nausea vomiting or diarrhea.  No other complaints.  He walked to the emergency department    Past Medical History  Diagnosis Date  . Alcohol abuse   . Depression   . Bipolar 1 disorder, depressed    Past Surgical History  Procedure Laterality Date  . No past surgeries     Family History  Problem Relation Age of Onset  . Hypertension Mother   . Cancer Father   . Heart failure Father    History  Substance Use Topics  . Smoking status: Current Every Day Smoker  . Smokeless tobacco: Not on file  . Alcohol Use: Yes     Comment: daily - "whatever I can get" - 1/5 plus daily    Review of Systems  All other systems reviewed and are negative.     Allergies  Review of patient's allergies indicates no known allergies.  Home Medications   Prior to Admission medications   Medication Sig Start Date End Date Taking? Authorizing Provider  chlordiazePOXIDE (LIBRIUM) 25 MG capsule  PO TID x 1D, then 25-50mg  PO BID X 1D, then 25-50mg  PO QD X 1D 10/24/14   Azalia Bilis, MD  FLUoxetine (PROZAC) 20 MG capsule Take 20 mg by mouth daily.    Historical Provider, MD  gabapentin (NEURONTIN) 300 MG capsule Take 300 mg by mouth 3 (three) times daily.    Historical Provider, MD  OLANZapine (ZYPREXA) 10 MG tablet Take 10 mg by mouth at bedtime.    Historical Provider, MD  Soft Lens Products (RENU MULTIPLUS LUB/REWETTING) SOLN Place 1 drop into both eyes 4 (four) times daily as needed (dry eyes).    Historical Provider, MD  traZODone (DESYREL) 100 MG tablet Take 100 mg by mouth  at bedtime.    Historical Provider, MD   BP 137/92 mmHg  Pulse 101  Temp(Src) 98.3 F (36.8 C) (Oral)  Resp 18  SpO2 96% Physical Exam  Constitutional: He is oriented to person, place, and time. He appears well-developed and well-nourished.  HENT:  Head: Normocephalic and atraumatic.  Eyes: EOM are normal.  Neck: Normal range of motion.  Cardiovascular: Normal rate, regular rhythm, normal heart sounds and intact distal pulses.   Pulmonary/Chest: Effort normal and breath sounds normal. No respiratory distress.  Abdominal: Soft. He exhibits no distension. There is no tenderness.  Musculoskeletal: Normal range of motion.  Neurological: He is alert and oriented to person, place, and time.  Skin: Skin is warm and dry.  Psychiatric: He has a normal mood and affect. Judgment normal.  Nursing note and vitals reviewed.   ED Course  Procedures (including critical care time) Labs Review Labs Reviewed - No data to display  Imaging Review No results found.   EKG Interpretation None      MDM   Final diagnoses:  Alcohol intoxication, uncomplicated    Medical screening examination of plate.  Intoxicated.  Ambulate in the emergency department.  Discharge home in good condition.  Vital signs without significant abnormality.  Home on Librium protocol.  Outpatient  resources given.    Azalia BilisKevin Tracina Beaumont, MD 10/24/14 [phone]

## 2014-10-24 NOTE — Discharge Instructions (Signed)
Alcohol Intoxication °Alcohol intoxication occurs when the amount of alcohol that a person has consumed impairs his or her ability to mentally and physically function. Alcohol directly impairs the normal chemical activity of the brain. Drinking large amounts of alcohol can lead to changes in mental function and behavior, and it can cause many physical effects that can be harmful.  °Alcohol intoxication can range in severity from mild to very severe. Various factors can affect the level of intoxication that occurs, such as the person's age, gender, weight, frequency of alcohol consumption, and the presence of other medical conditions (such as diabetes, seizures, or heart conditions). Dangerous levels of alcohol intoxication may occur when people drink large amounts of alcohol in a short period (binge drinking). Alcohol can also be especially dangerous when combined with certain prescription medicines or "recreational" drugs. °SIGNS AND SYMPTOMS °Some common signs and symptoms of mild alcohol intoxication include: °· Loss of coordination. °· Changes in mood and behavior. °· Impaired judgment. °· Slurred speech. °As alcohol intoxication progresses to more severe levels, other signs and symptoms will appear. These may include: °· Vomiting. °· Confusion and impaired memory. °· Slowed breathing. °· Seizures. °· Loss of consciousness. °DIAGNOSIS  °Your health care provider will take a medical history and perform a physical exam. You will be asked about the amount and type of alcohol you have consumed. Blood tests will be done to measure the concentration of alcohol in your blood. In many places, your blood alcohol level must be lower than 80 mg/dL (0.08%) to legally drive. However, many dangerous effects of alcohol can occur at much lower levels.  °TREATMENT  °People with alcohol intoxication often do not require treatment. Most of the effects of alcohol intoxication are temporary, and they go away as the alcohol naturally  leaves the body. Your health care provider will monitor your condition until you are stable enough to go home. Fluids are sometimes given through an IV access tube to help prevent dehydration.  °HOME CARE INSTRUCTIONS °· Do not drive after drinking alcohol. °· Stay hydrated. Drink enough water and fluids to keep your urine clear or pale yellow. Avoid caffeine.   °· Only take over-the-counter or prescription medicines as directed by your health care provider.   °SEEK MEDICAL CARE IF:  °· You have persistent vomiting.   °· You do not feel better after a few days. °· You have frequent alcohol intoxication. Your health care provider can help determine if you should see a substance use treatment counselor. °SEEK IMMEDIATE MEDICAL CARE IF:  °· You become shaky or tremble when you try to stop drinking.   °· You shake uncontrollably (seizure).   °· You throw up (vomit) blood. This may be bright red or may look like black coffee grounds.   °· You have blood in your stool. This may be bright red or may appear as a black, tarry, bad smelling stool.   °· You become lightheaded or faint.   °MAKE SURE YOU:  °· Understand these instructions. °· Will watch your condition. °· Will get help right away if you are not doing well or get worse. °Document Released: 03/22/2005 Document Revised: 02/12/2013 Document Reviewed: 11/15/2012 °ExitCare® Patient Information ©2015 [employer]. This information is not intended to replace advice given to you by your health care provider. Make sure you discuss any questions you have with your health care provider. ° ° °Emergency Department Resource Guide °1) Find a Doctor and Pay Out of Pocket °Although you won't have to find out who is covered by your   insurance plan, it is a good idea to ask around and get recommendations. You will then need to call the office and see if the doctor you have chosen will accept you as a new patient and what types of options they offer for patients who are self-pay.  Some doctors offer discounts or will set up payment plans for their patients who do not have insurance, but you will need to ask so you aren't surprised when you get to your appointment. ° °2) Contact Your Local Health Department °Not all health departments have doctors that can see patients for sick visits, but many do, so it is worth a call to see if yours does. If you don't know where your local health department is, you can check in your phone book. The CDC also has a tool to help you locate your state's health department, and many state websites also have listings of all of their local health departments. ° °3) Find a Walk-in Clinic °If your illness is not likely to be very severe or complicated, you may want to try a walk in clinic. These are popping up all over the country in pharmacies, drugstores, and shopping centers. They're usually staffed by nurse practitioners or physician assistants that have been trained to treat common illnesses and complaints. They're usually fairly quick and inexpensive. However, if you have serious medical issues or chronic medical problems, these are probably not your best option. ° °No Primary Care Doctor: °- Call [facility] at  832-8000 - they can help you locate a primary care doctor that  accepts your insurance, provides certain services, etc. °- Physician Referral Service- 1-800-533-3463 ° °Chronic Pain Problems: °Organization         Address  Phone   Notes  ° [facility]  (336) 297-2271 Patients need to be referred by their primary care doctor.  ° °Medication Assistance: °Organization         Address  Phone   Notes  °[place] [facility] [address] °[place], [place] [address] (336) 641-8030 --Must be a resident of [place] °-- Must have NO insurance coverage whatsoever (no Medicaid/ Medicare, etc.) °-- The pt. MUST have a primary care doctor that directs their care regularly and follows them in the  community °  °[facility]  (866) 331-1348   °[employer]  (888) 892-1162   ° °Agencies that provide inexpensive medical care: °Organization         Address  Phone   Notes  °[facility]  (336) 832-8035   °[facility]    (336) 832-7272   °[facility] [address] °[place], [place] [address] (336) 832-4777   °[facility] [address], °[place] (336) 271-4999   °[facility]    (336) 373-0678   °[facility]    (336) 272-1050   °[facility] ° [address], [place] Phone:  (336) 832-4444, Fax:  (336) 832-4440 Hours of Operation:  9 am - 6 pm, M-F.  Also accepts Medicaid/Medicare and self-pay.  °[facility] ° [address], [place] Phone: (336) 832-3150, Fax: (336) [phone]. Hours of Operation:  8:30 am - 5:30 pm, M-F.  Also accepts Medicaid and self-pay.  °[facility] [place] [address], [place] Phone: (336) 878-6027   °[facility] [address], [place], [place] (336)723-1848, Ext. 123 Mondays &   Thursdays: 7-9 AM.  First 15 patients are seen on a first come, first serve basis. °  ° °Medicaid-accepting [place] Providers: ° °Organization         Address  Phone   Notes  °[facility] [address], [place] (336) 641-2100 Also accepts self-pay patients.  °[facility] [address], [place] ° (336) 856-9996   °[facility] [address], [place] (336) 288-8857   °[facility] [address] [place] [address], [place] (336) 299-7000   °Veita Bland [address], [place]  ° (336) 373-1557 Only accepts [place] Access Medicaid patients after they have their name applied to their card.  ° °Self-Pay (no insurance) in [place]: ° °Organization         Address  Phone   Notes  °[facility] [address], [place] (336) 832-1970   °[facility] [address], [place] (336) 832-4400   °[facility] [place] ° [address], [place] (336) 992-4800   °[facility]/Dr. Osei-Bonsu ° [address] [place] [address], [place] or [address], [place] (336) 841-8500 Phone number for both [place] and [place] locations is the same.  °[facility] [address], [place] (336) 299-0000   °[facility] [address] [place] [address], [place] or [address] (336) 852-7530 °(336) 878-2260   °[facility] [address], [place] (336) 350-1642, phone; (336) 294-5005, fax Sees patients 1st and 3rd Saturday of every month.  Must not qualify for public or private insurance (i.e. Medicaid, Medicare, [place] Health Choice, Veterans' Benefits) • Household income should be no more than 200% of the poverty level •The clinic cannot treat you if you are pregnant or think you are pregnant • Sexually transmitted diseases are not treated at the clinic.  ° ° °Dental Care: °Organization         Address  Phone  Notes  °[facility] [address], [place] (336) 641-6152 Accepts children up to age 21 who are enrolled in Medicaid or [place] Health Choice; pregnant women with a Medicaid card; and children who have applied for Medicaid or [place] Health Choice, but were declined, whose parents can pay a reduced fee at time of service.  °[facility] [place]  [address], [place] (336) 641-7733 Accepts children up to age 21 who are enrolled in Medicaid or [place] Health Choice; pregnant women with a Medicaid card; and children who have applied for Medicaid or [place] Health Choice, but were declined, whose parents can pay a reduced fee at time of service.  °[facility] ° [address],  (336) 641-4533 Patients are seen by appointment only. Walk-ins are not accepted. [facility] will see patients 18 years of age and older. °Monday - Tuesday (8am-5pm) °Most Wednesdays (8:30-5pm) °$30 per visit, cash only  °[facility] ° [address], [place] (336) 641-4533 Patients are seen by appointment only. Walk-ins are not accepted. [facility] will see patients 18 years of age and older. °One Wednesday Evening (Monthly: Volunteer Based).  $30 per visit, cash only  °[facility]  (919) 537-3737 for adults; Children under   age 4, call Graduate Pediatric Dentistry at (919) [phone]. Children aged 4-14, please call (919) [phone] to request a pediatric application. ° Dental services are provided in all areas of dental care including fillings, crowns and bridges, complete and partial dentures, implants, gum treatment, root canals, and extractions. Preventive care is also provided. Treatment is provided to both adults and children. °Patients are selected via a lottery and there is often a waiting list. °  °[facility] [address], °[place] ° (336) 763-8833 [email or web address] °  °[facility] [address], [place], [place] (336)723-1848, Ext. 123 Second and Fourth Thursday of each month, opens at 6:30 AM; Clinic ends at 9 AM.  Patients are seen on a first-come first-served basis, and a limited number are seen during each clinic.  ° °[facility] ° [address], [place], [place] (336) 723-7904   Eligibility Requirements °You must have lived in [place], [place], or [place] counties for at least the last three months. °  You cannot be eligible for state or federal sponsored healthcare insurance, including Veterans Administration, Medicaid, or Medicare. °  You generally cannot be eligible for healthcare insurance through your employer.  °  How to apply: °Eligibility screenings are held every  Tuesday and Wednesday afternoon from 1:00 pm until 4:00 pm. You do not need an appointment for the interview!  °[facility] [address], [place], [place] 336-631-2330   °[facility]  336-342-8273   °[facility]  336-703-3100   °[facility]  336-570-6415   ° °Behavioral Health Resources in the Community: °Intensive Outpatient Programs °Organization         Address  Phone  Notes  °[facility] [address], [place], [place] 336-878-6098   °[facility] [address], [place], [place] 336-832-9800   °[facility] [address], [place], [place] ° 336-882-2125   °[facility] [address],  °[place], [place] 1-800-853-5163 or 336-641-4981   °Substance Abuse Resources °Organization         Address  Phone  Notes  °[facility]  336-882-2125   °[facility]  336-784-9470   °[facility]  336-285-9073   °[facility]  336-845-3988   °Residential & Outpatient Substance Abuse Program  1-800-659-3381   °Psychological Services °Organization         Address  Phone  Notes  °[facility]  336- 832-9600   °[facility]  336- 378-7881   °[facility] [address], [place] 1-800-853-5163 or 336-641-4981   ° °Mobile Crisis Teams °Organization         Address  Phone  Notes  °[facility], Mobile Crisis Care Unit  1-877-626-1772   °Assertive °[facility] ° [address] [place], [place] 336-834-9664   °Sharon DeEsch [address] °[place] 336-554-5454   ° °Self-Help/Support Groups °Organization         Address  Phone             Notes  °[facility]  - variety of support groups  336- 373-1402 Call for more information  °Narcotics Anonymous (NA), [facility] [address], °[place]   2 meetings at this location   ° °Residential Treatment Programs °Organization         Address  Phone  Notes  °[facility] [address],    °[place]  1-866-801-8205   °[facility] ° [address], [place], [place] 704-293-8524   °[facility] [address], [place] 336-845-3988 Admissions: 8am-3pm M-F  °[facility] [address]    °[place], [place] 336-841-1104   °[facility] [address], [place], [place] 336-379-7146   °[facility] [address]  °[place], [place] 336-285-9073   °[facility] Intensive Outpatient [address], [place], [place] 336-852-3033   °[facility] [address]  °[place], [place] 1-877-615-2722 or 336-784-9470   °[facility] [address] [place], [place] 336-227-7417 Accepts Medicaid  °[facility] [address]  °[place] 1-800-659-3381 Substance Abuse/Addiction Treatment  ° °[place] Behavioral Health Resources °Organization         Address  Phone  Notes  °[facility]  (888) 581-9988   °Julie Brannon, PhD [address] [place], [place]   (336) 349-5553 or (336) 951-0000   °[facility]   [address] °[place], [place] (336) 349-4454   °[facility] [address], [place], [place] (336) 342-8316 Insurance/Medicaid/sponsorship through [facility]  °[facility] [address]                                    [place], [place] (336) 342-8316 Therapy/tele-psych/case  °[facility] [address]  ° [place], [place] (336) 349-2233    °Dr. Arfeen  (336) 349-4544   °[facility] [place]  [employer] [place] [facility] 1) [address], [place] °2) [address], [place] °3)  [address], [place] (336) 349-3220 °(336) 342-7768 ° °(336) 342-8140   °[place] [facility] (336) 342-1394 or (336) 342-3537 (After  Hours)    ° ° ° °

## 2014-10-24 NOTE — ED Notes (Addendum)
Per EMS, ETOH-patient called EMS because he was sick-defecated himself-no complaints other that being drunk-BP 116/69, HR 81, RR 14-CBG 146

## 2014-10-25 ENCOUNTER — Encounter (HOSPITAL_COMMUNITY): Payer: Self-pay | Admitting: Emergency Medicine

## 2014-10-25 ENCOUNTER — Emergency Department (HOSPITAL_COMMUNITY)
Admission: EM | Admit: 2014-10-25 | Discharge: 2014-10-25 | Discharge status: Against Medical Advice | Payer: Self-pay | Attending: Emergency Medicine | Admitting: Emergency Medicine

## 2014-10-25 DIAGNOSIS — Z79899 Other long term (current) drug therapy: Secondary | ICD-10-CM | POA: Insufficient documentation

## 2014-10-25 DIAGNOSIS — F0390 Unspecified dementia without behavioral disturbance: Secondary | ICD-10-CM | POA: Insufficient documentation

## 2014-10-25 DIAGNOSIS — F319 Bipolar disorder, unspecified: Secondary | ICD-10-CM | POA: Insufficient documentation

## 2014-10-25 DIAGNOSIS — Z720 Tobacco use: Secondary | ICD-10-CM | POA: Insufficient documentation

## 2014-10-25 DIAGNOSIS — F10120 Alcohol abuse with intoxication, uncomplicated: Secondary | ICD-10-CM | POA: Insufficient documentation

## 2014-10-25 DIAGNOSIS — F10920 Alcohol use, unspecified with intoxication, uncomplicated: Secondary | ICD-10-CM

## 2014-10-25 DIAGNOSIS — IMO0002 Reserved for concepts with insufficient information to code with codable children: Secondary | ICD-10-CM

## 2014-10-25 DIAGNOSIS — F10129 Alcohol abuse with intoxication, unspecified: Secondary | ICD-10-CM | POA: Insufficient documentation

## 2014-10-25 DIAGNOSIS — F419 Anxiety disorder, unspecified: Secondary | ICD-10-CM | POA: Insufficient documentation

## 2014-10-25 LAB — CBG MONITORING, ED: GLUCOSE-CAPILLARY: 89 mg/dL (ref 70–99)

## 2014-10-25 NOTE — ED Provider Notes (Signed)
CSN: [id number]     Arrival date & time 10/25/14  0010 History   First MD Initiated Contact with Patient 10/25/14 [phone]     Chief Complaint  Patient presents with  . Alcohol Intoxication     (Consider location/radiation/quality/duration/timing/severity/associated sxs/prior Treatment) HPI Comments: Patient with a history of intoxication, brought in by EMS after being found in a laundromat sleeping on her being awoke and he was uncooperative, but was transported for evaluation.  On arrival to the emergency department.  He is sleeping  Patient is a 44 y.o. male presenting with intoxication. The history is provided by the EMS personnel. The history is limited by the condition of the patient.  Alcohol Intoxication This is a chronic problem. The current episode started today.    Past Medical History  Diagnosis Date  . Alcohol abuse   . Depression   . Bipolar 1 disorder, depressed    Past Surgical History  Procedure Laterality Date  . No past surgeries     Family History  Problem Relation Age of Onset  . Hypertension Mother   . Cancer Father   . Heart failure Father    History  Substance Use Topics  . Smoking status: Current Every Day Smoker  . Smokeless tobacco: Not on file  . Alcohol Use: Yes     Comment: daily - "whatever I can get" - 1/5 plus daily    Review of Systems  Unable to perform ROS: Other  All other systems reviewed and are negative.     Allergies  Review of patient's allergies indicates no known allergies.  Home Medications   Prior to Admission medications   Medication Sig Start Date End Date Taking? Authorizing Provider  chlordiazePOXIDE (LIBRIUM) 25 MG capsule 50mg  PO TID x 1D, then 25-50mg  PO BID X 1D, then 25-50mg  PO QD X 1D 10/24/14   Azalia BilisKevin Campos, MD  FLUoxetine (PROZAC) 20 MG capsule Take 20 mg by mouth daily.    Historical Provider, MD  gabapentin (NEURONTIN) 300 MG capsule Take 300 mg by mouth 3 (three) times daily.    Historical Provider, MD   OLANZapine (ZYPREXA) 10 MG tablet Take 10 mg by mouth at bedtime.    Historical Provider, MD  Soft Lens Products (RENU MULTIPLUS LUB/REWETTING) SOLN Place 1 drop into both eyes 4 (four) times daily as needed (dry eyes).    Historical Provider, MD  traZODone (DESYREL) 100 MG tablet Take 100 mg by mouth at bedtime.    Historical Provider, MD   BP 113/74 mmHg  Pulse 79  Temp(Src) 98.2 F (36.8 C) (Oral)  Resp 16  SpO2 92% Physical Exam  Constitutional: He appears well-developed and well-nourished.  HENT:  Head: Normocephalic.  Neck: Normal range of motion.  Pulmonary/Chest: Effort normal.  Vitals reviewed.   ED Course  Procedures (including critical care time) Labs Review Labs Reviewed - No data to display  Imaging Review No results found.   EKG Interpretation None     Little 5 caveat applies to this patient She was sleeping in the hallway under observation by nurses.  The entire time he got up to use the bathroom with a steady gait and walked out of the emergency department stating he was ready to leave MDM   Final diagnoses:  None         Earley FavorGail Eshal Propps, NP 10/25/14 2050  Mirian MoMatthew Gentry, MD 10/26/14 [phone]

## 2014-10-25 NOTE — ED Notes (Signed)
Pt is resting quietly with even, unlabored resp. Will continue to monitor

## 2014-10-25 NOTE — ED Notes (Signed)
Pt provided with sandwich, coffee and coke.

## 2014-10-25 NOTE — ED Notes (Signed)
Pt was found in [employer] bathroom with an empty bottle of listerine beside him. Pt has been here everyday for the past week. Pt in NAD

## 2014-10-25 NOTE — ED Notes (Signed)
Pt. Walking in hallway without difficulty. Left AMA.

## 2014-10-25 NOTE — ED Notes (Signed)
Brought in by EMS from a business establishment where he was found sleeping, obviously intoxicated.  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  Pt was uncooperative with EMS---- pulled out IV when inserted.  Pt arrived to ED very drowsy/sleepy but verbally responsive.

## 2014-10-25 NOTE — ED Notes (Signed)
[facility] Expected date:  Expected time:  Means of arrival:  Comments: Ems etoh

## 2014-10-25 NOTE — ED Notes (Signed)
Bed: [facility] Expected date:  Expected time:  Means of arrival:  Comments[facility] 85[facility]

## 2014-10-25 NOTE — ED Notes (Signed)
Pt CBG 89.  

## 2014-10-25 NOTE — ED Notes (Signed)
Pt walks w/o difficulty to BR with steady gait and back to stretcher

## 2014-10-25 NOTE — ED Provider Notes (Signed)
CSN: [id number]     Arrival date & time 10/25/14  1035 History   First MD Initiated Contact with Patient 10/25/14 1037     Chief Complaint  Patient presents with  . Alcohol Intoxication     (Consider location/radiation/quality/duration/timing/severity/associated sxs/prior Treatment) HPI  44 year old male who frequently comes to this ER intoxicated presents by EMS after he was found in the [employer] bathroom with empty bottle of Listerine beside him. He left this morning around 5 AM AMA after being intoxicated. The patient does not provide history at this time. He arouses to minimal stimulation, looks around, and then falls back asleep. This is the patient's 6th visit to this ER in the last 8 days for alcohol intoxication.  Past Medical History  Diagnosis Date  . Alcohol abuse   . Depression   . Bipolar 1 disorder, depressed    Past Surgical History  Procedure Laterality Date  . No past surgeries     Family History  Problem Relation Age of Onset  . Hypertension Mother   . Cancer Father   . Heart failure Father    History  Substance Use Topics  . Smoking status: Current Every Day Smoker  . Smokeless tobacco: Not on file  . Alcohol Use: Yes     Comment: daily - "whatever I can get" - 1/5 plus daily    Review of Systems  Unable to perform ROS: Dementia      Allergies  Review of patient's allergies indicates no known allergies.  Home Medications   Prior to Admission medications   Medication Sig Start Date End Date Taking? Authorizing Provider  chlordiazePOXIDE (LIBRIUM) 25 MG capsule 50mg  PO TID x 1D, then 25-50mg  PO BID X 1D, then 25-50mg  PO QD X 1D 10/24/14   Azalia BilisKevin Campos, MD  FLUoxetine (PROZAC) 20 MG capsule Take 20 mg by mouth daily.    Historical Provider, MD  gabapentin (NEURONTIN) 300 MG capsule Take 300 mg by mouth 3 (three) times daily.    Historical Provider, MD  OLANZapine (ZYPREXA) 10 MG tablet Take 10 mg by mouth at bedtime.    Historical Provider, MD   Soft Lens Products (RENU MULTIPLUS LUB/REWETTING) SOLN Place 1 drop into both eyes 4 (four) times daily as needed (dry eyes).    Historical Provider, MD  traZODone (DESYREL) 100 MG tablet Take 100 mg by mouth at bedtime.    Historical Provider, MD   BP 109/79 mmHg  Pulse 80  Temp(Src) 98 F (36.7 C) (Oral)  Resp 16  SpO2 95% Physical Exam  Constitutional: He appears well-developed and well-nourished. He appears lethargic.  HENT:  Head: Normocephalic and atraumatic.  Right Ear: External ear normal.  Left Ear: External ear normal.  Nose: Nose normal.  Eyes: Right eye exhibits no discharge. Left eye exhibits no discharge.  Neck: Neck supple.  Cardiovascular: Normal rate, regular rhythm, normal heart sounds and intact distal pulses.   Pulmonary/Chest: Effort normal and breath sounds normal.  Abdominal: Soft. He exhibits no distension. There is no tenderness.  Musculoskeletal: He exhibits no edema.  Neurological: He appears lethargic.  Skin: Skin is warm and dry.  Nursing note and vitals reviewed.   ED Course  Procedures (including critical care time) Labs Review Labs Reviewed  CBG MONITORING, ED    Imaging Review No results found.   EKG Interpretation None      MDM   Final diagnoses:  Alcohol intoxication, uncomplicated    Patient significantly intoxicated on arrival. On reevaluation patient much  more awake and alert but still mildly intoxicated, eating a sandwich and drinking coffee without difficulty. Discussed plan to further sober and then will be able to discharge as he was requesting. I went back later to reevaluate the patient and he had apparently gotten up and left according to other patients and visitors in the ER.    Pricilla Loveless, MD 10/25/14 [phone]

## 2014-10-27 ENCOUNTER — Encounter (HOSPITAL_COMMUNITY): Payer: Self-pay | Admitting: Emergency Medicine

## 2014-10-27 ENCOUNTER — Emergency Department (HOSPITAL_COMMUNITY)
Admission: EM | Admit: 2014-10-27 | Discharge: 2014-10-28 | Disposition: A | Discharge status: Home / Self Care | Payer: Self-pay | Attending: Emergency Medicine | Admitting: Emergency Medicine

## 2014-10-27 DIAGNOSIS — Z79899 Other long term (current) drug therapy: Secondary | ICD-10-CM | POA: Insufficient documentation

## 2014-10-27 DIAGNOSIS — F10120 Alcohol abuse with intoxication, uncomplicated: Secondary | ICD-10-CM | POA: Insufficient documentation

## 2014-10-27 DIAGNOSIS — F10920 Alcohol use, unspecified with intoxication, uncomplicated: Secondary | ICD-10-CM

## 2014-10-27 DIAGNOSIS — F319 Bipolar disorder, unspecified: Secondary | ICD-10-CM | POA: Insufficient documentation

## 2014-10-27 DIAGNOSIS — Z720 Tobacco use: Secondary | ICD-10-CM | POA: Insufficient documentation

## 2014-10-27 MED ORDER — VITAMIN B-1 100 MG PO TABS
100.0000 mg | ORAL_TABLET | Freq: Once | ORAL | Status: DC
  Filled 2014-10-27: qty 1

## 2014-10-27 NOTE — ED Notes (Signed)
Pt arrived to the ED via [facility].  Pt was [facility] at the jail because he stated he had a problem walking.  Pt states he has been drinking since he awoke.  Pt states he wants detox from alcohol

## 2014-10-27 NOTE — ED Notes (Signed)
[facility] Expected date:  Expected time:  Means of arrival:  Comments: EMS 43yo M ETOH detox

## 2014-10-27 NOTE — ED Provider Notes (Signed)
CSN: [id number]     Arrival date & time 10/27/14  2128 History   First MD Initiated Contact with Patient 10/27/14 2135     Chief Complaint  Patient presents with  . Alcohol Problem   HPI Patient presents to the emergency room with alcohol intoxication. Patient was being taken to jail for public intoxication. He has been drinking since he woke up this morning.   Patient told staff at jail that he cannot walk so he was sent to the emergency room.  Patient denies any complaints. Denies any chest pain or shortness of breath. Not tell me what he was specifically was drinking today.  Patient is frequently seen in the emergency department for alcohol intoxication. He was last seen in the emergency room on May 1. Past Medical History  Diagnosis Date  . Alcohol abuse   . Depression   . Bipolar 1 disorder, depressed    Past Surgical History  Procedure Laterality Date  . No past surgeries     Family History  Problem Relation Age of Onset  . Hypertension Mother   . Cancer Father   . Heart failure Father    History  Substance Use Topics  . Smoking status: Current Every Day Smoker  . Smokeless tobacco: Not on file  . Alcohol Use: Yes     Comment: daily - "whatever I can get" - 1/5 plus daily    Review of Systems  Unable to perform ROS: Mental status change      Allergies  Review of patient's allergies indicates no known allergies.  Home Medications   Prior to Admission medications   Medication Sig Start Date End Date Taking? Authorizing Provider  chlordiazePOXIDE (LIBRIUM) 25 MG capsule  PO TID x 1D, then 25-50mg  PO BID X 1D, then 25-50mg  PO QD X 1D Patient not taking: Reported on 10/25/2014 10/24/14   Azalia Bilis, MD  FLUoxetine (PROZAC) 20 MG capsule Take 20 mg by mouth daily.    Historical Provider, MD  gabapentin (NEURONTIN) 300 MG capsule Take 300 mg by mouth 3 (three) times daily.    Historical Provider, MD  OLANZapine (ZYPREXA) 10 MG tablet Take 10 mg by mouth at bedtime.     Historical Provider, MD  Soft Lens Products (RENU MULTIPLUS LUB/REWETTING) SOLN Place 1 drop into both eyes 4 (four) times daily as needed (dry eyes).    Historical Provider, MD  traZODone (DESYREL) 100 MG tablet Take 100 mg by mouth at bedtime.    Historical Provider, MD   BP 124/77 mmHg  Pulse 98  Temp(Src) 98 F (36.7 C) (Oral)  Resp 18  SpO2 100% Physical Exam  Constitutional: No distress.  HENT:  Head: Normocephalic and atraumatic.  Right Ear: External ear normal.  Left Ear: External ear normal.  Breath smells of alcohol  Eyes: Conjunctivae are normal. Right eye exhibits no discharge. Left eye exhibits no discharge. No scleral icterus.  Neck: Neck supple. No tracheal deviation present.  Cardiovascular: Normal rate, regular rhythm and intact distal pulses.   Pulmonary/Chest: Effort normal and breath sounds normal. No stridor. No respiratory distress. He has no wheezes. He has no rales.  Abdominal: Soft. Bowel sounds are normal. He exhibits no distension. There is no tenderness. There is no rebound and no guarding.  Musculoskeletal: He exhibits no edema or tenderness.  Neurological: He has normal strength. No cranial nerve deficit (no facial droop, extraocular movements intact, no slurred speech) or sensory deficit. He exhibits normal muscle tone. He displays no seizure  activity.  Somnolent however patient will wake up when I rouse him  Skin: Skin is warm and dry. No rash noted. He is not diaphoretic.  Psychiatric: He has a normal mood and affect.  Nursing note and vitals reviewed.   ED Course  Procedures (including critical care time) Labs Review Labs Reviewed - No data to display  Imaging Review No results found.   EKG Interpretation None      MDM   Final diagnoses:  Alcohol intoxication, uncomplicated    The patient has an ED care plan.  He has frequent visits to the ED.  Care plan suggests avoiding lab tests unless clinically indicated.  Pt denies any  complaints.  Vitals stable.  Will wait for him to sober up.   Linwood DibblesJon Kalvin Buss, MD 11/02/14 [phone]

## 2014-10-28 NOTE — Discharge Instructions (Signed)
Alcohol Intoxication  Alcohol intoxication occurs when the amount of alcohol that a person has consumed impairs his or her ability to mentally and physically function. Alcohol directly impairs the normal chemical activity of the brain. Drinking large amounts of alcohol can lead to changes in mental function and behavior, and it can cause many physical effects that can be harmful.   Alcohol intoxication can range in severity from mild to very severe. Various factors can affect the level of intoxication that occurs, such as the person's age, gender, weight, frequency of alcohol consumption, and the presence of other medical conditions (such as diabetes, seizures, or heart conditions). Dangerous levels of alcohol intoxication may occur when people drink large amounts of alcohol in a short period (binge drinking). Alcohol can also be especially dangerous when combined with certain prescription medicines or "recreational" drugs.  SIGNS AND SYMPTOMS  Some common signs and symptoms of mild alcohol intoxication include:  · Loss of coordination.  · Changes in mood and behavior.  · Impaired judgment.  · Slurred speech.  As alcohol intoxication progresses to more severe levels, other signs and symptoms will appear. These may include:  · Vomiting.  · Confusion and impaired memory.  · Slowed breathing.  · Seizures.  · Loss of consciousness.  DIAGNOSIS   Your health care provider will take a medical history and perform a physical exam. You will be asked about the amount and type of alcohol you have consumed. Blood tests will be done to measure the concentration of alcohol in your blood. In many places, your blood alcohol level must be lower than 80 mg/dL (0.08%) to legally drive. However, many dangerous effects of alcohol can occur at much lower levels.   TREATMENT   People with alcohol intoxication often do not require treatment. Most of the effects of alcohol intoxication are temporary, and they go away as the alcohol naturally  leaves the body. Your health care provider will monitor your condition until you are stable enough to go home. Fluids are sometimes given through an IV access tube to help prevent dehydration.   HOME CARE INSTRUCTIONS  · Do not drive after drinking alcohol.  · Stay hydrated. Drink enough water and fluids to keep your urine clear or pale yellow. Avoid caffeine.    · Only take over-the-counter or prescription medicines as directed by your health care provider.    SEEK MEDICAL CARE IF:   · You have persistent vomiting.    · You do not feel better after a few days.  · You have frequent alcohol intoxication. Your health care provider can help determine if you should see a substance use treatment counselor.  SEEK IMMEDIATE MEDICAL CARE IF:   · You become shaky or tremble when you try to stop drinking.    · You shake uncontrollably (seizure).    · You throw up (vomit) blood. This may be bright red or may look like black coffee grounds.    · You have blood in your stool. This may be bright red or may appear as a black, tarry, bad smelling stool.    · You become lightheaded or faint.    MAKE SURE YOU:   · Understand these instructions.  · Will watch your condition.  · Will get help right away if you are not doing well or get worse.  Document Released: 03/22/2005 Document Revised: 02/12/2013 Document Reviewed: 11/15/2012  ExitCare® Patient Information ©2015 [employer]. This information is not intended to replace advice given to you by your health care provider. Make sure   you discuss any questions you have with your health care provider.

## 2014-10-28 NOTE — ED Notes (Signed)
Pt ambulated in the hallway with no signs of distress. Pt is ready for discharge.

## 2014-10-28 NOTE — ED Provider Notes (Signed)
Patient is ambulating without difficulty. Clinically sober.  Loren Raceravid Dayvon Dax, MD 10/28/14 [phone]

## 2014-11-04 ENCOUNTER — Encounter (HOSPITAL_COMMUNITY): Payer: Self-pay | Admitting: *Deleted

## 2014-11-04 ENCOUNTER — Emergency Department (HOSPITAL_COMMUNITY)
Admission: EM | Admit: 2014-11-04 | Discharge: 2014-11-04 | Discharge status: Against Medical Advice | Payer: Self-pay | Attending: Emergency Medicine | Admitting: Emergency Medicine

## 2014-11-04 DIAGNOSIS — F1010 Alcohol abuse, uncomplicated: Secondary | ICD-10-CM | POA: Insufficient documentation

## 2014-11-04 DIAGNOSIS — Z720 Tobacco use: Secondary | ICD-10-CM | POA: Insufficient documentation

## 2014-11-04 NOTE — ED Notes (Signed)
Per EMS they were contacted by patient. He was standing up against the door of the laundry mat c/o right arm pain and asking for etoh detox.

## 2014-11-05 ENCOUNTER — Encounter (HOSPITAL_COMMUNITY): Payer: Self-pay | Admitting: *Deleted

## 2014-11-05 ENCOUNTER — Emergency Department (HOSPITAL_COMMUNITY)
Admission: EM | Admit: 2014-11-05 | Discharge: 2014-11-06 | Disposition: A | Discharge status: Home / Self Care | Payer: Self-pay | Attending: Emergency Medicine | Admitting: Emergency Medicine

## 2014-11-05 DIAGNOSIS — K029 Dental caries, unspecified: Secondary | ICD-10-CM | POA: Insufficient documentation

## 2014-11-05 DIAGNOSIS — Z720 Tobacco use: Secondary | ICD-10-CM | POA: Insufficient documentation

## 2014-11-05 DIAGNOSIS — F319 Bipolar disorder, unspecified: Secondary | ICD-10-CM | POA: Insufficient documentation

## 2014-11-05 DIAGNOSIS — Z791 Long term (current) use of non-steroidal anti-inflammatories (NSAID): Secondary | ICD-10-CM | POA: Insufficient documentation

## 2014-11-05 DIAGNOSIS — F1010 Alcohol abuse, uncomplicated: Secondary | ICD-10-CM | POA: Insufficient documentation

## 2014-11-05 DIAGNOSIS — K009 Disorder of tooth development, unspecified: Secondary | ICD-10-CM | POA: Insufficient documentation

## 2014-11-05 DIAGNOSIS — Z79899 Other long term (current) drug therapy: Secondary | ICD-10-CM | POA: Insufficient documentation

## 2014-11-05 DIAGNOSIS — K047 Periapical abscess without sinus: Secondary | ICD-10-CM | POA: Insufficient documentation

## 2014-11-05 MED ORDER — MELOXICAM 7.5 MG PO TABS
7.5000 mg | ORAL_TABLET | Freq: Once | ORAL | Status: AC
  Administered 2014-11-05: 7.5 mg via ORAL
  Filled 2014-11-05: qty 1

## 2014-11-05 MED ORDER — AMOXICILLIN-POT CLAVULANATE 875-125 MG PO TABS
1.0000 | ORAL_TABLET | Freq: Once | ORAL | Status: AC
  Administered 2014-11-05: 1 via ORAL
  Filled 2014-11-05: qty 1

## 2014-11-05 NOTE — ED Provider Notes (Signed)
CSN: [id number]     Arrival date & time 11/05/14  1953 History   First MD Initiated Contact with Patient 11/05/14 2056     Chief Complaint  Patient presents with  . Alcohol Intoxication     (Consider location/radiation/quality/duration/timing/severity/associated sxs/prior Treatment) Patient is a 44 y.o. male presenting with intoxication. The history is provided by the patient and medical records.  Alcohol Intoxication Pertinent negatives include no fever.    44 year old male with history of alcohol abuse, depression, and bipolar disorder, presenting to the ED acutely intoxicated. Patient is well known to the ED and myself from prior visits. He states he has been drinking mouthwash.  He has no current physical complaints at this time , he only wants something to eat and to take his antibiotics for his dental abscess. He states he thinks his abscess was getting better. He denies any fever or chills. No difficulty swallowing.  Past Medical History  Diagnosis Date  . Alcohol abuse   . Depression   . Bipolar 1 disorder, depressed    Past Surgical History  Procedure Laterality Date  . No past surgeries     Family History  Problem Relation Age of Onset  . Hypertension Mother   . Cancer Father   . Heart failure Father    History  Substance Use Topics  . Smoking status: Current Every Day Smoker  . Smokeless tobacco: Not on file  . Alcohol Use: Yes     Comment: daily - "whatever I can get" - 1/5 plus daily    Review of Systems  Constitutional: Negative for fever.  HENT: Positive for dental problem.   Psychiatric/Behavioral:       EtOH  All other systems reviewed and are negative.     Allergies  Review of patient's allergies indicates no known allergies.  Home Medications   Prior to Admission medications   Medication Sig Start Date End Date Taking? Authorizing Provider  amoxicillin-clavulanate (AUGMENTIN) 875-125 MG per tablet Take 1 tablet by mouth 2 (two) times daily.    Yes Historical Provider, MD  FLUoxetine (PROZAC) 20 MG capsule Take 20 mg by mouth daily.   Yes Historical Provider, MD  gabapentin (NEURONTIN) 300 MG capsule Take 300 mg by mouth 3 (three) times daily.   Yes Historical Provider, MD  meloxicam (MOBIC) 15 MG tablet Take 15 mg by mouth daily.   Yes Historical Provider, MD  Soft Lens Products (RENU MULTIPLUS LUB/REWETTING) SOLN Place 1 drop into both eyes 4 (four) times daily as needed (dry eyes).   Yes Historical Provider, MD  traZODone (DESYREL) 100 MG tablet Take 150 mg by mouth at bedtime.    Yes Historical Provider, MD  chlordiazePOXIDE (LIBRIUM) 25 MG capsule 50mg  PO TID x 1D, then 25-50mg  PO BID X 1D, then 25-50mg  PO QD X 1D Patient not taking: Reported on 10/25/2014 10/24/14   Azalia BilisKevin Campos, MD  OLANZapine (ZYPREXA) 10 MG tablet Take 10 mg by mouth at bedtime.    Historical Provider, MD   BP 135/80 mmHg  Pulse 102  Temp(Src) 98.1 F (36.7 C) (Oral)  Resp 18  SpO2 92%   Physical Exam  Constitutional: He is oriented to person, place, and time. He appears well-developed and well-nourished. No distress.  Appears intoxicated  HENT:  Head: Normocephalic and atraumatic.  Mouth/Throat: Oropharynx is clear and moist and mucous membranes are normal. Abnormal dentition. Dental caries present.  Teeth largely in very poor dentition, right upper molar with large cavity present, surrounding gingiva with  mild amount of swelling without appreciable fluid collection or abscess, handling secretions appropriately, no trismus  Eyes: Conjunctivae and EOM are normal. Pupils are equal, round, and reactive to light.  Neck: Normal range of motion. Neck supple.  Cardiovascular: Normal rate, regular rhythm and normal heart sounds.   Pulmonary/Chest: Effort normal and breath sounds normal. No respiratory distress. He has no wheezes.  Abdominal: Soft. Bowel sounds are normal. There is no tenderness. There is no guarding.  Musculoskeletal: Normal range of motion. He  exhibits no edema.  Neurological: He is alert and oriented to person, place, and time.  Skin: Skin is warm and dry. He is not diaphoretic.  Psychiatric: He has a normal mood and affect. His speech is slurred.  Speech slurred  Nursing note and vitals reviewed.   ED Course  Procedures (including critical care time) Labs Review Labs Reviewed - No data to display  Imaging Review No results found.   EKG Interpretation None      MDM   Final diagnoses:  Alcohol abuse  Dental infection   44 year old male here acutely intoxicated. He is well-known to the ED and myself from prior visits. He clinically appears intoxicated. His only current complaint is a right upper dental abscess which he is currently being treated for. He has no appreciable facial or neck swelling on exam, no concern for Ludwig's angina. He is handling secretions well and has no difficulty swallowing, speaking, or eating.  Will allow to sleep here in the ED, d/c home when clinically sober.  [facility], will reassess in morning.and d/c when appropriate.  Jordan HatchetLisa M Lillyahna Hemberger, PA-C 11/06/14 [phone]  Zadie Rhineonald Wickline, MD 11/06/14 [phone]

## 2014-11-05 NOTE — ED Notes (Signed)
Pt came to ED requesting detox. Pt states his last drank mouthwash an hour ago. Pt walked back to room, is calm cooperative, alert and oriented. No triage orders put in due to care plan.

## 2014-11-05 NOTE — ED Notes (Signed)
Pt keeps coming out of the room and stating he wants some meds for his abscess, and something to eat.

## 2014-11-05 NOTE — ED Notes (Addendum)
Writer provided pt with a [place] sandwich and two Gatorade drinks

## 2014-11-06 NOTE — ED Provider Notes (Signed)
[id number] - Patient ambulatory with steady gait. He is hemodynamically stable and stable for discharge.  Jordan MaduraKelly Hajer Dwyer, PA-C 11/06/14 [id number]  April Palumbo, MD 11/06/14 [phone]

## 2014-11-06 NOTE — Discharge Instructions (Signed)
Continue your antibiotics for dental abscess. Stop drinking alcohol.   Emergency Department Resource Guide 1) Find a Doctor and Pay Out of Pocket Although you won't have to find out who is covered by your insurance plan, it is a good idea to ask around and get recommendations. You will then need to call the office and see if the doctor you have chosen will accept you as a new patient and what types of options they offer for patients who are self-pay. Some doctors offer discounts or will set up payment plans for their patients who do not have insurance, but you will need to ask so you aren't surprised when you get to your appointment.  2) Contact Your Local Health Department Not all health departments have doctors that can see patients for sick visits, but many do, so it is worth a call to see if yours does. If you don't know where your local health department is, you can check in your phone book. The CDC also has a tool to help you locate your state's health department, and many state websites also have listings of all of their local health departments.  3) Find a Walk-in Clinic If your illness is not likely to be very severe or complicated, you may want to try a walk in clinic. These are popping up all over the country in pharmacies, drugstores, and shopping centers. They're usually staffed by nurse practitioners or physician assistants that have been trained to treat common illnesses and complaints. They're usually fairly quick and inexpensive. However, if you have serious medical issues or chronic medical problems, these are probably not your best option.  No Primary Care Doctor: - Call Health Connect at  [phone] - they can help you locate a primary care doctor that  accepts your insurance, provides certain services, etc. - Physician Referral Service- [phone]  Chronic Pain Problems: Organization         Address  Phone   Notes  [facility]  [phone] Patients  need to be referred by their primary care doctor.   Medication Assistance: Organization         Address  Phone   Notes  [facility] [address] [place], [place] [address] [phone] --Must be a resident of [facility] -- Must have NO insurance coverage whatsoever (no Medicaid/ Medicare, etc.) -- The pt. MUST have a primary care doctor that directs their care regularly and follows them in the community   [facility]ist  [phone]   [employer]  [phone]    Agencies that provide inexpensive medical care: Organization         Address  Phone   Notes  [facility]  [phone]   [facility]    [phone]   [facility] [address] [place], [place] [address] [phone]   [facility] [address] [place], [place] [phone]   Planned Parenthood    [phone]   [facility]    [phone]   [facility]  [address], [place] Phone:  [phone], Fax:  [phone] Hours of Operation:  9 am - 6 pm, M-F.  Also accepts Medicaid/Medicare and self-pay.  [facility]  [address], [place] Phone: [phone], Fax: [phone]. Hours of Operation:  8:30 am - 5:30 pm, M-F.  Also accepts Medicaid and self-pay.  [facility] [place] [address], [place] Phone: [phone]) [phone]   [facility] [address], [place], [place] [phone], Ext. [phone] Mondays & Thursdays: 7-9 AM.  First 15 patients are seen on a first come, first serve basis.    Medicaid-accepting [facility] Providers:  Organization         Address  Phone   Notes  [facility] [address], [place] [phone] Also accepts self-pay patients.  [facility] [address] [place], [place]  [phone]) [phone]     [facility] [address], [place] [phone]) [phone]   [facility] [address] [place] [address], [place] [phone]   Renaye RakersVeita Bland [address], [place]   [phone]) [phone] Only accepts [place] Access [place] patients after they have their name applied to their card.   Self-Pay (no insurance) in [place]:  Organization         Address  Phone   Notes  Sickle Cell Patients, [facility] [address], [place] [phone]   [facility] [address], [place] [phone]) [phone]   [facility]  [address], [place] [phone]) [phone]   [facility]/Dr. Osei-Bonsu  [address] [place] [address], [place] or [phone], [place] [phone]) [phone] Phone number for both [place] and [place] locations is the same.  [facility] [address], [place] [phone]   [facility] [address] [place] [address], [place] or [address] [phone]) [phone]   [facility] [address], [place] [phone]) [phone], phone; [phone](336) [phone], fax Sees patients 1st and 3rd Saturday of every month.  Must not qualify for public or private insurance (i.e. Medicaid, Medicare, [place] Health Choice, Veterans' Benefits)  Household income should be no more than 200% of the poverty level The clinic cannot treat you if you are pregnant or think you are pregnant  Sexually transmitted diseases are not treated at the clinic.    Dental Care: Organization         Address  Phone  Notes  [facility] [address], [place] [phone] Accepts children up to age 321 who are enrolled in [place] or [place] Health Choice; pregnant women with a Medicaid card; and children who have applied for Medicaid or [place] Health Choice, but were declined,  whose parents can pay a reduced fee at time of service.  [facility] [place]  [address], [place] [phone]) [phone] Accepts children up to age 46 who are enrolled in [place] or [place] Health Choice; pregnant women with a Medicaid card; and children who have applied for Medicaid or [place] Health Choice, but were declined, whose parents can pay a reduced fee at time of service.  [facility]  [address], [place] [phone] Patients are seen by appointment only. Walk-ins are not accepted. [facility] will see patients 46 years of age and older. Monday - Tuesday (8am-5pm) Most Wednesdays (8:30-5pm) $30 per visit, cash only  [facility]  [address], [facility] [phone] Patients are seen by appointment only. Walk-ins are not accepted. [facility] will see patients 37 years of age and older. One Wednesday Evening (Monthly: Volunteer Based).  $30 per visit, cash only  [employer]  [phone] for adults; Children under age 32, call Graduate Pediatric Dentistry at [phone]. Children aged 1-14, please call [phone] to request a pediatric application.  Dental services are provided in all areas of dental care including fillings, crowns and bridges, complete and partial dentures, implants, gum treatment, root canals, and extractions. Preventive care is also provided. Treatment is provided to both adults and children. Patients are selected via a lottery and there is often a waiting list.   [facility] [address] [place]  [phone] [email or web address]   [facility] [address] [place], [place] [phone], Ext. [phone] Second and Fourth Thursday of each month, opens at 6:30 AM; Clinic ends at 9 AM.  Patients are seen on a first-come first-served basis, and a limited number are seen during each clinic.   [facility]  [address] [place], [place] [phone]   Eligibility Requirements You must have lived in [place], [place], or [place] counties for at least the last three months.   You cannot be eligible for state or federal sponsored [place], including [employer], [place], or [employer].   You generally cannot be eligible for healthcare insurance through your employer.    How to apply: Eligibility screenings are held every Tuesday and Wednesday afternoon from 1:00 pm until 4:00 pm. You do not need an appointment for the interview!  [facility] [address], [place], [place] [phone]   [facility]  [phone]   [facility] Health Department  [phone]   [facility] Health Department  [phone]    Behavioral Health Resources in the Community: Intensive Outpatient Programs Organization         Address  Phone  Notes  [facility] [address], [place], [place] [phone]   [facility] [address] [place], [place] [phone]   [facility] [address] [place], [place]  [phone]   [facility] [address],  [place], [place] [phone] or [phone]   Substance Abuse Resources Organization         Address  Phone  Notes  [facility] Drug [facility]  [phone]   [facility]  [phone]   [facility] [place]  [phone]   Floydene Flock  [phone]   Residential & Outpatient Substance Abuse Program  [phone]   Psychological Services Organization         Address  Phone  Notes  [facility]  [phone]   [facility]  [phone]   [facility] [address], [place] [phone] or [phone]    Mobile Crisis Teams Organization         Address  Phone  Notes  [facility], Mobile Crisis Care Unit   [phone]   [facility]  [address] [place], [place] [phone]   Doristine Locks [address] [place] [phone]    Self-Help/Support Groups Organization         Address  Phone             Notes  [facility] [place] - variety of support groups  336- I7437963 Call for more information  Narcotics Anonymous (NA), [facility] [address], [place]  2 meetings at this location   Residential Treatment Programs Organization         Address  Phone  Notes  [facility] [address],    [place]  [phone]   [facility]  [address], [place] [phone], [place], [place] [phone]   [facility] [address], [place] [phone] Admissions: 8am-3pm M-F  [facility] [address]    [place], [place] [phone]   [facility] [address], [place], [place] [phone]   [facility] [address]  [place], [place] [phone]   [facility] - Intensive Outpatient [address], [place], [place] [phone]   [facility] ([facility] [address]  [place], [place] [phone] or [phone]   [facility] ([facility]) [address] [place], [place] [phone] Accepts Medicaid  [facility] [address]  [place] [phone] Substance Abuse/Addiction Treatment   [facility] Behavioral Health Resources Organization         Address  Phone  Notes  [facility]  [phone]) [phone]   Angie FavaJulie Brannon, PhD [address], [address] [place], [place]   [phone]) [phone] or [phone]) [phone]   [facility]   [address] [place], [place] [phone]) [phone]   [facility] [address], [place], [place] [phone](336) [phone] Insurance/Medicaid/sponsorship through [facility]  [facility] [address]                                     [place], [place] [phone](336) [phone] Therapy/tele-psych/case  [facility] [address].   [place], [place] [phone]) [phone]    Dr. Lolly MustacheArfeen  [phone]) [phone]   [facility] [place]  [employer] [facility] 1) [address], [place] 2) [address], [place] 3)  [address], [place] [phone]) [phone]) [phone]  [phone]) [phone]   [facility] Child Abuse Hotline [phone](336) [phone] or [phone]) [phone] (After Hours)

## 2014-11-28 ENCOUNTER — Emergency Department (HOSPITAL_COMMUNITY)
Admission: EM | Admit: 2014-11-28 | Discharge: 2014-11-28 | Disposition: A | Discharge status: Home / Self Care | Payer: Self-pay | Attending: Emergency Medicine | Admitting: Emergency Medicine

## 2014-11-28 DIAGNOSIS — Z720 Tobacco use: Secondary | ICD-10-CM | POA: Insufficient documentation

## 2014-11-28 DIAGNOSIS — F1010 Alcohol abuse, uncomplicated: Secondary | ICD-10-CM | POA: Insufficient documentation

## 2014-11-28 DIAGNOSIS — Z79899 Other long term (current) drug therapy: Secondary | ICD-10-CM | POA: Insufficient documentation

## 2014-11-28 DIAGNOSIS — F319 Bipolar disorder, unspecified: Secondary | ICD-10-CM | POA: Insufficient documentation

## 2014-11-28 DIAGNOSIS — Z792 Long term (current) use of antibiotics: Secondary | ICD-10-CM | POA: Insufficient documentation

## 2014-11-28 LAB — COMPREHENSIVE METABOLIC PANEL
ALT: 15 U/L — ABNORMAL LOW (ref 17–63)
AST: 29 U/L (ref 15–41)
Albumin: 4 g/dL (ref 3.5–5.0)
Alkaline Phosphatase: 66 U/L (ref 38–126)
Anion gap: 12 (ref 5–15)
BUN: 11 mg/dL (ref 6–20)
CALCIUM: 8.5 mg/dL — AB (ref 8.9–10.3)
CO2: 23 mmol/L (ref 22–32)
Chloride: 107 mmol/L (ref 101–111)
Creatinine, Ser: 0.64 mg/dL (ref 0.61–1.24)
GFR calc Af Amer: >60 mL/min (ref >60)
Glucose, Bld: 119 mg/dL — ABNORMAL HIGH (ref 65–99)
Potassium: 3.6 mmol/L (ref 3.5–5.1)
Sodium: 142 mmol/L (ref 135–145)
Total Bilirubin: 0.1 mg/dL — ABNORMAL LOW (ref 0.3–1.2)
Total Protein: 7 g/dL (ref 6.5–8.1)

## 2014-11-28 LAB — CBC
HEMATOCRIT: 41.9 % (ref 39.0–52.0)
Hemoglobin: 14 g/dL (ref 13.0–17.0)
MCH: 31.5 pg (ref 26.0–34.0)
MCHC: 33.4 g/dL (ref 30.0–36.0)
MCV: 94.4 fL (ref 78.0–100.0)
PLATELETS: 260 10*3/uL (ref 150–400)
RBC: 4.44 MIL/uL (ref 4.22–5.81)
RDW: 14.7 % (ref 11.5–15.5)
WBC: 6 10*3/uL (ref 4.0–10.5)

## 2014-11-28 NOTE — ED Provider Notes (Signed)
CSN: [id number]     Arrival date & time 11/28/14  4098 History   First MD Initiated Contact with Patient 11/28/14 [phone]     Chief Complaint  Patient presents with  . Alcohol Intoxication     (Consider location/radiation/quality/duration/timing/severity/associated sxs/prior Treatment) HPI Comments: 44 y/o alcoholic who was found intoxicated on the sidewalk outside of a restaurant this morning- he was brought here for that reason only - he is sleeping but arousable and when asked why he is here, he states that he has a drinking problem - states that he has been in alcohol detox many times in the past, too many to count according to his own words, last time was approximately 8 months ago. 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 States that he drinks liquor, states he had "a lot " last night  Patient is a 44 y.o. male presenting with intoxication. The history is provided by the patient and medical records.  Alcohol Intoxication    Past Medical History  Diagnosis Date  . Alcohol abuse   . Depression   . Bipolar 1 disorder, depressed    Past Surgical History  Procedure Laterality Date  . No past surgeries     Family History  Problem Relation Age of Onset  . Hypertension Mother   . Cancer Father   . Heart failure Father    History  Substance Use Topics  . Smoking status: Current Every Day Smoker  . Smokeless tobacco: Not on file  . Alcohol Use: Yes     Comment: daily - "whatever I can get" - 1/5 plus daily    Review of Systems  All other systems reviewed and are negative.     Allergies  Review of patient's allergies indicates no known allergies.  Home Medications   Prior to Admission medications   Medication Sig Start Date End Date Taking? Authorizing Provider  amoxicillin-clavulanate (AUGMENTIN) 875-125 MG per tablet Take 1 tablet by mouth 2 (two)  times daily.    Historical Provider, MD  chlordiazePOXIDE (LIBRIUM) 25 MG capsule  PO TID x 1D, then 25-50mg  PO BID X 1D, then 25-50mg  PO QD X 1D Patient not taking: Reported on 10/25/2014 10/24/14   Azalia Bilis, MD  FLUoxetine (PROZAC) 20 MG capsule Take 20 mg by mouth daily.    Historical Provider, MD  gabapentin (NEURONTIN) 300 MG capsule Take 300 mg by mouth 3 (three) times daily.    Historical Provider, MD  meloxicam (MOBIC) 15 MG tablet Take 15 mg by mouth daily.    Historical Provider, MD  OLANZapine (ZYPREXA) 10 MG tablet Take 10 mg by mouth at bedtime.    Historical Provider, MD  Soft Lens Products (RENU MULTIPLUS LUB/REWETTING) SOLN Place 1 drop into both eyes 4 (four) times daily as needed (dry eyes).    Historical Provider, MD  traZODone (DESYREL) 100 MG tablet Take 150 mg by mouth at bedtime.     Historical Provider, MD   BP 101/54 mmHg  Pulse 71  Temp(Src) 97.4 F (36.3 C) (Oral)  Resp 16  SpO2 99% Physical Exam  Constitutional: He appears well-developed and well-nourished. No distress.  HENT:  Head: Normocephalic and atraumatic.  Mouth/Throat: Oropharynx is clear and moist. No oropharyngeal exudate.  Eyes: Conjunctivae and EOM are normal. Pupils are equal, round, and reactive to light. Right eye exhibits no discharge. Left eye exhibits no discharge. No  scleral icterus.  Neck: Normal range of motion. Neck supple. No JVD present. No thyromegaly present.  Cardiovascular: Normal rate, regular rhythm, normal heart sounds and intact distal pulses.  Exam reveals no gallop and no friction rub.   No murmur heard. Pulmonary/Chest: Effort normal and breath sounds normal. No respiratory distress. He has no wheezes. He has no rales.  Abdominal: Soft. Bowel sounds are normal. He exhibits no distension and no mass. There is no tenderness.  Musculoskeletal: Normal range of motion. He exhibits no edema or tenderness.  Lymphadenopathy:    He has no cervical adenopathy.  Neurological: He  is alert. Coordination normal.  Slurring speech, discoordination diffusely, somnolent but easily arousable, smells of alcohol  Skin: Skin is warm and dry. No rash noted. No erythema.  Psychiatric: He has a normal mood and affect. His behavior is normal.  Nursing note and vitals reviewed.   ED Course  Procedures (including critical care time) Labs Review Labs Reviewed  COMPREHENSIVE METABOLIC PANEL - Abnormal; Notable for the following:    Glucose, Bld 119 (*)    Calcium 8.5 (*)    ALT 15 (*)    Total Bilirubin 0.1 (*)    All other components within normal limits  CBC    Imaging Review No results found.    MDM   Final diagnoses:  Alcohol abuse    The patient has no signs of trauma, normal cardiac and pulmonary exams, soft abdomen, no rashes, no edema, neurologic exam consistent with intoxicated state, review of the medical record shows that the patient is here multiple times per month with alcohol intoxication. At this time the patient does not appear sincere about his desire to stop alcohol, he is intoxicated, he will be discharged when clinically sober  The patient is now ambulatory, it is 10:30 in the morning, he has clear speech, normal balance, he has been given a copy of his results and a list of follow-up phone numbers for substance abuse, he can be discharged home, he appears stable, he is in agreement with the plan. He now states he does not want to be treated in a long-term care facility for his alcohol abuse.  Eber HongBrian Akaila Rambo, MD 11/28/14 1025

## 2014-11-28 NOTE — Discharge Instructions (Signed)
RESOURCE GUIDE ° °Chronic Pain Problems: °Contact [facility]  [phone] °Patients need to be referred by their primary care doctor. ° °Insufficient Money for Medicine: °Contact [employer]:  call "211."  ° °No Primary Care Doctor: °- Call [facility]  [phone] - can help you locate a primary care doctor that  accepts your insurance, provides certain services, etc. °- Physician Referral Service- 1-800-533-3463 ° °Agencies that provide inexpensive medical care: °- [facility]  [phone] °- [facility]  [phone] °- [facility]  [phone] °- [facility]  [phone] °- [facility]  [phone] °- [facility]  [phone] ° °Medicaid-accepting [place] Providers: °- [facility]- [address] ° [phone], Mon-Fri 9am-7pm, Sat 9am-1pm °- [facility]- [address], [address] ° [phone] °- [facility]- [address], [address] ° [phone] °- [facility]- [address] [place] [address] ° [phone] °- [facility]- [address], [address], [phone] ° Only accepts [place] Access Medicaid patients after they have their name  applied to their card ° °Self Pay (no insurance) in [place]: °- Sickle Cell Patients: Dr Eric Dean, [facility] ° [address], [phone] °- [facility]- [address] ° [phone] °      -     [facility]- [address] °      -     [facility]- see information above (Speak to Pam H if you do not have insurance) °      -  [facility] [place]- [address],  [phone] °      -  [facility]- [address] [place] [address], [phone] °      -  Dr Osei-Bonsu-  [address], [place], [phone] °      -  [facility] - [address], [phone] °      -  [facility]- [address] [place] [address], [phone],  also [address], [phone] °      -    [facility]- [address], [phone], 1st & 3rd Saturday °       every month, 10am-1pm ° °[facility] °[address] °[place], [place] [address] °[phone] ° °[facility] °[address] °[place], [place] [address] °([phone]) [phone] ° °1) Find a Doctor and Pay Out of Pocket °Although you won't have to find out who is covered by your insurance plan, it is a good idea to ask around and get recommendations. You will then need to call the office and see if the doctor you have chosen will accept you as a new patient and what types of options they offer for patients who are self-pay. Some doctors offer discounts or will set up payment plans for their patients who do not have insurance, but you will need to ask so you aren't surprised when you get to your appointment. ° °2) Contact Your Local Health Department °Not all health departments have doctors that can see patients for sick visits, but many do, so it is worth a call to see if yours does. If you don't know where your local health department is, you can check in your phone book. The CDC also has a tool   to help you locate your state's health department, and many state websites also have listings of all of their local health departments. ° °3) Find a Walk-in Clinic °If your illness is not likely to be very severe or complicated, you may want to try a walk in clinic. These are popping up all over the country in pharmacies, drugstores, and shopping centers. They're usually staffed by nurse practitioners or physician assistants that have been trained to treat common illnesses and complaints. They're usually fairly quick and inexpensive. However, if you have serious medical issues or chronic medical problems, these are probably not your best option ° °STD Testing °- [place] [facility] [place], [facility], [address], [place],  phone [phone] or 1-877-539-9860.  Monday - Friday, call for an appointment. °- [place] [facility] [place], [facility], [address], [place], phone [phone] or 1-877-539-9860.  Monday - Friday, call for an appointment. ° °Abuse/Neglect: °- [place] Child Abuse Hotline (336) 641-3795 °- [place] Child Abuse Hotline 800-378-5315 (After Hours) ° °Emergency Shelter:  [facility] (336) 271-5985 ° °Maternity Homes: °- [facility] (336) 275-9566 °- [facility] (704) 372-4663 ° °MRSA Hotline #:   832-7006 ° °Dental Assistance °If unable to pay or uninsured, contact:  [place] [facility] to become qualified for the adult dental clinic. ° °Patients with Medicaid: [facility] °[address], [phone] °[address], [phone] ° °If unable to pay, or uninsured, contact [place] [facility] ([phone] in [place], [phone] in [place]) to become qualified for the adult dental clinic ° °[facility] °[address] °[place], [place] [address] °(336) 272-4177 °[email or web address] ° °Other Low-Cost Community Dental Services: °- [facility]- [address], [place], [place], [address], [phone], Ext. [address], 2nd and 4th Thursday of the month at 6:30am.  10 clients each day by appointment, can sometimes see walk-in patients if someone does not show for an appointment. °- [facility]- [address], [place], [place], [address], [phone] °- [facility]- [address], [place], [place], [address], [phone] °- [facility]- [phone] °- [facility]- [phone] °- [facility]- [phone] °-  °

## 2014-11-28 NOTE — ED Notes (Signed)
Bed: WHALD Expected date:  Expected time:  Means of arrival:  Comments: 

## 2014-11-28 NOTE — ED Notes (Addendum)
Pt was here last night and left the building and couldn't be found.  Per EMS: [facility] [employer] [facility]  Pt smells to have ingested listerine although he reported liquor.  Pt is sleeping but arousable to sound and is currently cooperative.  VS by EMS: 152/90, 80, 16, CBG 137.

## 2014-12-01 ENCOUNTER — Encounter (HOSPITAL_COMMUNITY): Payer: Self-pay | Admitting: *Deleted

## 2014-12-01 ENCOUNTER — Emergency Department (HOSPITAL_COMMUNITY)
Admission: EM | Admit: 2014-12-01 | Discharge: 2014-12-02 | Disposition: A | Discharge status: Home / Self Care | Payer: Self-pay | Attending: Emergency Medicine | Admitting: Emergency Medicine

## 2014-12-01 DIAGNOSIS — F1010 Alcohol abuse, uncomplicated: Secondary | ICD-10-CM | POA: Insufficient documentation

## 2014-12-01 DIAGNOSIS — Z792 Long term (current) use of antibiotics: Secondary | ICD-10-CM | POA: Insufficient documentation

## 2014-12-01 DIAGNOSIS — Z79899 Other long term (current) drug therapy: Secondary | ICD-10-CM | POA: Insufficient documentation

## 2014-12-01 DIAGNOSIS — Z720 Tobacco use: Secondary | ICD-10-CM | POA: Insufficient documentation

## 2014-12-01 DIAGNOSIS — F319 Bipolar disorder, unspecified: Secondary | ICD-10-CM | POA: Insufficient documentation

## 2014-12-01 DIAGNOSIS — Z791 Long term (current) use of non-steroidal anti-inflammatories (NSAID): Secondary | ICD-10-CM | POA: Insufficient documentation

## 2014-12-01 NOTE — ED Notes (Signed)
PT HAS BEEN WANDED AND SCRUBED OUT. PT'S BELONGINGS ARE IN LOCKER 4029

## 2014-12-01 NOTE — ED Provider Notes (Signed)
CSN: [id number]     Arrival date & time 12/01/14  1830 History   First MD Initiated Contact with Patient 12/01/14 1903     Chief Complaint  Patient presents with  . Alcohol Intoxication     (Consider location/radiation/quality/duration/timing/severity/associated sxs/prior Treatment) Patient is a 44 y.o. male presenting with intoxication. The history is provided by the patient and medical records.  Alcohol Intoxication   This is a 44 year old male with history of alcohol abuse, depression, bipolar disorder, presenting to the ED by EMS after being found sleeping in a [employer] parking lot. Patient states he was going in to get some food, however he did not make it to the door. He states he got tired so he decided to lay down. He denies any injuries.  He does admit to heavy alcohol use today. He admits to drinking vodka and Listerine but cannot quantify how much. He denies any illicit drug use.  Patient states "I just need to sleep it off".  VSS.   Past Medical History  Diagnosis Date  . Alcohol abuse   . Depression   . Bipolar 1 disorder, depressed    Past Surgical History  Procedure Laterality Date  . No past surgeries     Family History  Problem Relation Age of Onset  . Hypertension Mother   . Cancer Father   . Heart failure Father    History  Substance Use Topics  . Smoking status: Current Every Day Smoker  . Smokeless tobacco: Not on file  . Alcohol Use: Yes     Comment: daily - "whatever I can get" - 1/5 plus daily    Review of Systems  Psychiatric/Behavioral:       EtOH  All other systems reviewed and are negative.     Allergies  Review of patient's allergies indicates no known allergies.  Home Medications   Prior to Admission medications   Medication Sig Start Date End Date Taking? Authorizing Provider  amoxicillin-clavulanate (AUGMENTIN) 875-125 MG per tablet Take 1 tablet by mouth 2 (two) times daily.    Historical Provider, MD  chlordiazePOXIDE (LIBRIUM) 25  MG capsule  PO TID x 1D, then 25-50mg  PO BID X 1D, then 25-50mg  PO QD X 1D Patient not taking: Reported on 10/25/2014 10/24/14   Azalia Bilis, MD  FLUoxetine (PROZAC) 20 MG capsule Take 20 mg by mouth daily.    Historical Provider, MD  gabapentin (NEURONTIN) 300 MG capsule Take 300 mg by mouth 3 (three) times daily.    Historical Provider, MD  meloxicam (MOBIC) 15 MG tablet Take 15 mg by mouth daily.    Historical Provider, MD  OLANZapine (ZYPREXA) 10 MG tablet Take 10 mg by mouth at bedtime.    Historical Provider, MD  Soft Lens Products (RENU MULTIPLUS LUB/REWETTING) SOLN Place 1 drop into both eyes 4 (four) times daily as needed (dry eyes).    Historical Provider, MD  traZODone (DESYREL) 100 MG tablet Take 150 mg by mouth at bedtime.     Historical Provider, MD   BP 132/85 mmHg  Pulse 92  Temp(Src) 98 F (36.7 C) (Oral)  Resp 18  SpO2 96%   Physical Exam  Constitutional: He appears well-developed and well-nourished. No distress.  Disheveled appearance, appears intoxicated  HENT:  Head: Normocephalic and atraumatic.  Mouth/Throat: Oropharynx is clear and moist.  Eyes: Conjunctivae and EOM are normal. Pupils are equal, round, and reactive to light.  Neck: Normal range of motion. Neck supple.  Cardiovascular: Normal rate, regular rhythm  and normal heart sounds.   Pulmonary/Chest: Effort normal and breath sounds normal. No respiratory distress. He has no wheezes.  Abdominal: Soft. Bowel sounds are normal. There is no tenderness. There is no guarding.  Musculoskeletal: Normal range of motion.  Neurological: He is alert.  Appears intoxicated but arousable to verbal and tactile stimuli; answers questions and follows commands when prompted; moving all extremities well  Skin: Skin is warm and dry. He is not diaphoretic.  Psychiatric: He has a normal mood and affect.  Nursing note and vitals reviewed.   ED Course  Procedures (including critical care time) Labs Review Labs Reviewed -  No data to display  Imaging Review No results found.   EKG Interpretation None      MDM   Final diagnoses:  Alcohol abuse   44 year old male familiar to myself from prior ED visits. He is here acutely intoxicated after being found sleeping in a [employer] parking lot.  Patient is disheveled and appears intoxicated.   He arouses to verbal and tactile stimuli and is able to answer questions and follow commands when prompted. He is moving all his extremities well. Patient has an ED care plan with frequent visits for acute alcohol intoxication. He will be allowed to sober here.  Anticipate discharge with outpatient resources once clinically sober.  Garlon HatchetLisa M Faigy Stretch, PA-C 12/01/14 2350  Benjiman CoreNathan Pickering, MD 12/01/14 [phone]

## 2014-12-01 NOTE — ED Notes (Signed)
Bed: [id number] Expected date:  Expected time:  Means of arrival:  Comments: Tr2

## 2014-12-01 NOTE — ED Notes (Signed)
Pt reports drinking liquor and listerine.

## 2014-12-01 NOTE — ED Notes (Signed)
Per EMS, pt was found sleeping in a [employer] parking lot.  By stander called 911.  Pt reports heavy alcohol use today.  Pt's gait is unsteady.

## 2014-12-02 NOTE — ED Notes (Signed)
Patient is resting comfortably. 

## 2014-12-02 NOTE — Discharge Instructions (Signed)
Stop using/abusing alcohol. Follow-up with your primary care physician. Return here for new concerns. Alcohol Intoxication Alcohol intoxication occurs when you drink enough alcohol that it affects your ability to function. It can be mild or very severe. Drinking a lot of alcohol in a short time is called binge drinking. This can be very harmful. Drinking alcohol can also be more dangerous if you are taking medicines or other drugs. Some of the effects caused by alcohol may include:  Loss of coordination.  Changes in mood and behavior.  Unclear thinking.  Trouble talking (slurred speech).  Throwing up (vomiting).  Confusion.  Slowed breathing.  Twitching and shaking (seizures).  Loss of consciousness. HOME CARE  Do not drive after drinking alcohol.  Drink enough water and fluids to keep your pee (urine) clear or pale yellow. Avoid caffeine.  Only take medicine as told by your doctor. GET HELP IF:  You throw up (vomit) many times.  You do not feel better after a few days.  You frequently have alcohol intoxication. Your doctor can help decide if you should see a substance use treatment counselor. GET HELP RIGHT AWAY IF:  You become shaky when you stop drinking.  You have twitching and shaking.  You throw up blood. It may look bright red or like coffee grounds.  You notice blood in your poop (bowel movements).  You become lightheaded or pass out (faint). MAKE SURE YOU:   Understand these instructions.  Will watch your condition.  Will get help right away if you are not doing well or get worse. Document Released: 11/29/2007 Document Revised: 02/12/2013 Document Reviewed: 11/15/2012 [facility] Patient Information 2015 [place] [place]. This information is not intended to replace advice given to you by your health care provider. Make sure you discuss any questions you have with your health care provider.

## 2014-12-02 NOTE — ED Notes (Addendum)
Pt awakened and asked if he could get belongings to find cell phone. States he is ready to get out of here. Gait is steady. Pt request water to drink.  MD notified.

## 2014-12-03 ENCOUNTER — Encounter (HOSPITAL_COMMUNITY): Payer: Self-pay | Admitting: Emergency Medicine

## 2014-12-03 ENCOUNTER — Emergency Department (HOSPITAL_COMMUNITY)
Admission: EM | Admit: 2014-12-03 | Discharge: 2014-12-03 | Discharge status: Against Medical Advice | Payer: Self-pay | Attending: Emergency Medicine | Admitting: Emergency Medicine

## 2014-12-03 DIAGNOSIS — Z720 Tobacco use: Secondary | ICD-10-CM | POA: Insufficient documentation

## 2014-12-03 DIAGNOSIS — F10129 Alcohol abuse with intoxication, unspecified: Secondary | ICD-10-CM | POA: Insufficient documentation

## 2014-12-03 NOTE — ED Notes (Signed)
Patient ambulating to room and bathroom without difficulty-gait steady

## 2014-12-03 NOTE — ED Notes (Signed)
Pt was seen walking out ambulance bay doors.

## 2014-12-03 NOTE — ED Notes (Signed)
Pt here due to alcohol intoxication. Denies SI.

## 2014-12-05 ENCOUNTER — Encounter (HOSPITAL_COMMUNITY): Payer: Self-pay | Admitting: Emergency Medicine

## 2014-12-05 ENCOUNTER — Emergency Department (HOSPITAL_COMMUNITY)
Admission: EM | Admit: 2014-12-05 | Discharge: 2014-12-05 | Disposition: A | Discharge status: Home / Self Care | Payer: Self-pay | Attending: Emergency Medicine | Admitting: Emergency Medicine

## 2014-12-05 DIAGNOSIS — Z791 Long term (current) use of non-steroidal anti-inflammatories (NSAID): Secondary | ICD-10-CM | POA: Insufficient documentation

## 2014-12-05 DIAGNOSIS — Z79899 Other long term (current) drug therapy: Secondary | ICD-10-CM | POA: Insufficient documentation

## 2014-12-05 DIAGNOSIS — R61 Generalized hyperhidrosis: Secondary | ICD-10-CM | POA: Insufficient documentation

## 2014-12-05 DIAGNOSIS — Z720 Tobacco use: Secondary | ICD-10-CM | POA: Insufficient documentation

## 2014-12-05 DIAGNOSIS — F319 Bipolar disorder, unspecified: Secondary | ICD-10-CM | POA: Insufficient documentation

## 2014-12-05 DIAGNOSIS — F10120 Alcohol abuse with intoxication, uncomplicated: Secondary | ICD-10-CM | POA: Insufficient documentation

## 2014-12-05 DIAGNOSIS — F10920 Alcohol use, unspecified with intoxication, uncomplicated: Secondary | ICD-10-CM

## 2014-12-05 NOTE — ED Notes (Addendum)
Pt arrived voluntarily with police requesting detox from alcohol. He is unable how much alcohol he has consumed today. Denies SI/HI.

## 2014-12-05 NOTE — ED Notes (Signed)
MD at bedside. Dr. Goldston at bedside.  

## 2014-12-05 NOTE — ED Provider Notes (Signed)
CSN: [id number]     Arrival date & time 12/05/14  1914 History   First MD Initiated Contact with Patient 12/05/14 1918     Chief Complaint  Patient presents with  . Alcohol Intoxication     (Consider location/radiation/quality/duration/timing/severity/associated sxs/prior Treatment) HPI  44 year old male presents requesting alcohol detox. The patient states he has drank alcohol today, most recently at 3 PM, but states today was a "light day". The patient states he's only drank a few beers. Patient is requesting Ativan or Valium because he thinks he is going through withdrawal. He states earlier he was sweating. No suicidal thoughts.  Past Medical History  Diagnosis Date  . Alcohol abuse   . Depression   . Bipolar 1 disorder, depressed    Past Surgical History  Procedure Laterality Date  . No past surgeries     Family History  Problem Relation Age of Onset  . Hypertension Mother   . Cancer Father   . Heart failure Father    History  Substance Use Topics  . Smoking status: Current Every Day Smoker  . Smokeless tobacco: Not on file  . Alcohol Use: Yes     Comment: daily - "whatever I can get" - 1/5 plus daily    Review of Systems  Constitutional: Positive for diaphoresis. Negative for fever.  Gastrointestinal: Negative for vomiting.  Psychiatric/Behavioral: Negative for suicidal ideas.  All other systems reviewed and are negative.     Allergies  Review of patient's allergies indicates no known allergies.  Home Medications   Prior to Admission medications   Medication Sig Start Date End Date Taking? Authorizing Provider  amoxicillin-clavulanate (AUGMENTIN) 875-125 MG per tablet Take 1 tablet by mouth 2 (two) times daily.    Historical Provider, MD  chlordiazePOXIDE (LIBRIUM) 25 MG capsule 50mg  PO TID x 1D, then 25-50mg  PO BID X 1D, then 25-50mg  PO QD X 1D Patient not taking: Reported on 10/25/2014 10/24/14   Azalia Bilis, MD  FLUoxetine (PROZAC) 20 MG capsule Take 20  mg by mouth daily.    Historical Provider, MD  gabapentin (NEURONTIN) 300 MG capsule Take 300 mg by mouth 3 (three) times daily.    Historical Provider, MD  meloxicam (MOBIC) 15 MG tablet Take 15 mg by mouth daily.    Historical Provider, MD  OLANZapine (ZYPREXA) 10 MG tablet Take 10 mg by mouth at bedtime.    Historical Provider, MD  Soft Lens Products (RENU MULTIPLUS LUB/REWETTING) SOLN Place 1 drop into both eyes 4 (four) times daily as needed (dry eyes).    Historical Provider, MD  traZODone (DESYREL) 100 MG tablet Take 150 mg by mouth at bedtime.     Historical Provider, MD   BP 142/89 mmHg  Pulse 107  Temp(Src) 98.6 F (37 C) (Oral)  Resp 16  SpO2 97% Physical Exam  Constitutional: He is oriented to person, place, and time. He appears well-developed and well-nourished.  Patient is clinically intoxicated  HENT:  Head: Normocephalic and atraumatic.  Right Ear: External ear normal.  Left Ear: External ear normal.  Nose: Nose normal.  Eyes: Right eye exhibits no discharge. Left eye exhibits no discharge.  Neck: Neck supple.  Cardiovascular: Normal rate, regular rhythm, normal heart sounds and intact distal pulses.   Pulmonary/Chest: Effort normal.  Abdominal: Soft. There is no tenderness.  Musculoskeletal: He exhibits no edema.  Neurological: He is alert and oriented to person, place, and time. He displays no tremor. He exhibits normal muscle tone.  CN II-12 grossly  intact. 5/5 strength in all 4 extremities.  Skin: Skin is warm and dry.  Nursing note and vitals reviewed.   ED Course  Procedures (including critical care time) Labs Review Labs Reviewed - No data to display  Imaging Review No results found.   EKG Interpretation None      MDM   Final diagnoses:  Alcohol intoxication, uncomplicated    Patient is well known to the emergency department for our call intoxication. He frequently asked for detox and then when he sobers he leaves. This is how his  presentation is occurred today. He has not claiming to be suicidal. He initially was asking for benzodiazepine this when he was intoxicated but there was no sign of alcohol withdrawal and he had drank just a few hours earlier. The patient ambulated normally to the ambulance bay to smoke. He appears to have sobered and is stable for discharge.    Pricilla Loveless, MD 12/05/14 2230

## 2014-12-05 NOTE — Discharge Instructions (Signed)
Alcohol Intoxication °Alcohol intoxication occurs when the amount of alcohol that a person has consumed impairs his or her ability to mentally and physically function. Alcohol directly impairs the normal chemical activity of the brain. Drinking large amounts of alcohol can lead to changes in mental function and behavior, and it can cause many physical effects that can be harmful.  °Alcohol intoxication can range in severity from mild to very severe. Various factors can affect the level of intoxication that occurs, such as the person's age, gender, weight, frequency of alcohol consumption, and the presence of other medical conditions (such as diabetes, seizures, or heart conditions). Dangerous levels of alcohol intoxication may occur when people drink large amounts of alcohol in a short period (binge drinking). Alcohol can also be especially dangerous when combined with certain prescription medicines or "recreational" drugs. °SIGNS AND SYMPTOMS °Some common signs and symptoms of mild alcohol intoxication include: °· Loss of coordination. °· Changes in mood and behavior. °· Impaired judgment. °· Slurred speech. °As alcohol intoxication progresses to more severe levels, other signs and symptoms will appear. These may include: °· Vomiting. °· Confusion and impaired memory. °· Slowed breathing. °· Seizures. °· Loss of consciousness. °DIAGNOSIS  °Your health care provider will take a medical history and perform a physical exam. You will be asked about the amount and type of alcohol you have consumed. Blood tests will be done to measure the concentration of alcohol in your blood. In many places, your blood alcohol level must be lower than 80 mg/dL (0.08%) to legally drive. However, many dangerous effects of alcohol can occur at much lower levels.  °TREATMENT  °People with alcohol intoxication often do not require treatment. Most of the effects of alcohol intoxication are temporary, and they go away as the alcohol naturally  leaves the body. Your health care provider will monitor your condition until you are stable enough to go home. Fluids are sometimes given through an IV access tube to help prevent dehydration.  °HOME CARE INSTRUCTIONS °· Do not drive after drinking alcohol. °· Stay hydrated. Drink enough water and fluids to keep your urine clear or pale yellow. Avoid caffeine.   °· Only take over-the-counter or prescription medicines as directed by your health care provider.   °SEEK MEDICAL CARE IF:  °· You have persistent vomiting.   °· You do not feel better after a few days. °· You have frequent alcohol intoxication. Your health care provider can help determine if you should see a substance use treatment counselor. °SEEK IMMEDIATE MEDICAL CARE IF:  °· You become shaky or tremble when you try to stop drinking.   °· You shake uncontrollably (seizure).   °· You throw up (vomit) blood. This may be bright red or may look like black coffee grounds.   °· You have blood in your stool. This may be bright red or may appear as a black, tarry, bad smelling stool.   °· You become lightheaded or faint.   °MAKE SURE YOU:  °· Understand these instructions. °· Will watch your condition. °· Will get help right away if you are not doing well or get worse. °Document Released: 03/22/2005 Document Revised: 02/12/2013 Document Reviewed: 11/15/2012 °[employer]® Patient Information ©2015 [employer]. This information is not intended to replace advice given to you by your health care provider. Make sure you discuss any questions you have with your health care provider. ° °Alcohol Use Disorder °Alcohol use disorder is a mental disorder. It is not a one-time incident of heavy drinking. Alcohol use disorder is the excessive and   uncontrollable use of alcohol over time that leads to problems with functioning in one or more areas of daily living. People with this disorder risk harming themselves and others when they drink to excess. Alcohol use disorder also  can cause other mental disorders, such as mood and anxiety disorders, and serious physical problems. People with alcohol use disorder often misuse other drugs.  °Alcohol use disorder is common and widespread. Some people with this disorder drink alcohol to cope with or escape from negative life events. Others drink to relieve chronic pain or symptoms of mental illness. People with a family history of alcohol use disorder are at higher risk of losing control and using alcohol to excess.  °SYMPTOMS  °Signs and symptoms of alcohol use disorder may include the following:  °· Consumption of alcohol in larger amounts or over a longer period of time than intended. °· Multiple unsuccessful attempts to cut down or control alcohol use.   °· A great deal of time spent obtaining alcohol, using alcohol, or recovering from the effects of alcohol (hangover). °· A strong desire or urge to use alcohol (cravings).   °· Continued use of alcohol despite problems at work, school, or home because of alcohol use.   °· Continued use of alcohol despite problems in relationships because of alcohol use. °· Continued use of alcohol in situations when it is physically hazardous, such as driving a car. °· Continued use of alcohol despite awareness of a physical or psychological problem that is likely related to alcohol use. Physical problems related to alcohol use can involve the brain, heart, liver, stomach, and intestines. Psychological problems related to alcohol use include intoxication, depression, anxiety, psychosis, delirium, and dementia.   °· The need for increased amounts of alcohol to achieve the same desired effect, or a decreased effect from the consumption of the same amount of alcohol (tolerance). °· Withdrawal symptoms upon reducing or stopping alcohol use, or alcohol use to reduce or avoid withdrawal symptoms. Withdrawal symptoms include: °¨ Racing heart. °¨ Hand tremor. °¨ Difficulty  sleeping. °¨ Nausea. °¨ Vomiting. °¨ Hallucinations. °¨ Restlessness. °¨ Seizures. °DIAGNOSIS °Alcohol use disorder is diagnosed through an assessment by your health care provider. Your health care provider may start by asking three or four questions to screen for excessive or problematic alcohol use. To confirm a diagnosis of alcohol use disorder, at least two symptoms must be present within a 12-month period. The severity of alcohol use disorder depends on the number of symptoms: °· Mild--two or three. °· Moderate--four or five. °· Severe--six or more. °Your health care provider may perform a physical exam or use results from lab tests to see if you have physical problems resulting from alcohol use. Your health care provider may refer you to a mental health professional for evaluation. °TREATMENT  °Some people with alcohol use disorder are able to reduce their alcohol use to low-risk levels. Some people with alcohol use disorder need to quit drinking alcohol. When necessary, mental health professionals with specialized training in substance use treatment can help. Your health care provider can help you decide how severe your alcohol use disorder is and what type of treatment you need. The following forms of treatment are available:  °· Detoxification. Detoxification involves the use of prescription medicines to prevent alcohol withdrawal symptoms in the first week after quitting. This is important for people with a history of symptoms of withdrawal and for heavy drinkers who are likely to have withdrawal symptoms. Alcohol withdrawal can be dangerous and, in severe cases, cause death. Detoxification   is usually provided in a hospital or in-patient substance use treatment facility. °· Counseling or talk therapy. Talk therapy is provided by substance use treatment counselors. It addresses the reasons people use alcohol and ways to keep them from drinking again. The goals of talk therapy are to help people with alcohol  use disorder find healthy activities and ways to cope with life stress, to identify and avoid triggers for alcohol use, and to handle cravings, which can cause relapse. °· Medicines. Different medicines can help treat alcohol use disorder through the following actions: °¨ Decrease alcohol cravings. °¨ Decrease the positive reward response felt from alcohol use. °¨ Produce an uncomfortable physical reaction when alcohol is used (aversion therapy). °· Support groups. Support groups are run by people who have quit drinking. They provide emotional support, advice, and guidance. °These forms of treatment are often combined. Some people with alcohol use disorder benefit from intensive combination treatment provided by specialized substance use treatment centers. Both inpatient and outpatient treatment programs are available. °Document Released: 07/20/2004 Document Revised: 10/27/2013 Document Reviewed: 09/19/2012 °[employer]® Patient Information ©2015 [employer]. This information is not intended to replace advice given to you by your health care provider. Make sure you discuss any questions you have with your health care provider. ° °

## 2014-12-19 ENCOUNTER — Emergency Department (HOSPITAL_COMMUNITY)
Admission: EM | Admit: 2014-12-19 | Discharge: 2014-12-19 | Disposition: A | Discharge status: Home / Self Care | Payer: Self-pay | Attending: Emergency Medicine | Admitting: Emergency Medicine

## 2014-12-19 ENCOUNTER — Encounter (HOSPITAL_COMMUNITY): Payer: Self-pay | Admitting: Emergency Medicine

## 2014-12-19 DIAGNOSIS — F319 Bipolar disorder, unspecified: Secondary | ICD-10-CM | POA: Insufficient documentation

## 2014-12-19 DIAGNOSIS — Z79899 Other long term (current) drug therapy: Secondary | ICD-10-CM | POA: Insufficient documentation

## 2014-12-19 DIAGNOSIS — Z791 Long term (current) use of non-steroidal anti-inflammatories (NSAID): Secondary | ICD-10-CM | POA: Insufficient documentation

## 2014-12-19 DIAGNOSIS — Z720 Tobacco use: Secondary | ICD-10-CM | POA: Insufficient documentation

## 2014-12-19 DIAGNOSIS — Z792 Long term (current) use of antibiotics: Secondary | ICD-10-CM | POA: Insufficient documentation

## 2014-12-19 DIAGNOSIS — F1010 Alcohol abuse, uncomplicated: Secondary | ICD-10-CM | POA: Insufficient documentation

## 2014-12-19 LAB — COMPREHENSIVE METABOLIC PANEL
ALK PHOS: 80 U/L (ref 38–126)
ALT: 118 U/L — ABNORMAL HIGH (ref 17–63)
AST: 311 U/L — AB (ref 15–41)
Albumin: 4.5 g/dL (ref 3.5–5.0)
Anion gap: 18 — ABNORMAL HIGH (ref 5–15)
BUN: 7 mg/dL (ref 6–20)
CALCIUM: 9.5 mg/dL (ref 8.9–10.3)
CO2: 29 mmol/L (ref 22–32)
Chloride: 86 mmol/L — ABNORMAL LOW (ref 101–111)
Creatinine, Ser: 0.77 mg/dL (ref 0.61–1.24)
GFR calc non Af Amer: >60 mL/min (ref >60)
Glucose, Bld: 139 mg/dL — ABNORMAL HIGH (ref 65–99)
POTASSIUM: 3.5 mmol/L (ref 3.5–5.1)
SODIUM: 133 mmol/L — AB (ref 135–145)
Total Bilirubin: 1 mg/dL (ref 0.3–1.2)
Total Protein: 7.6 g/dL (ref 6.5–8.1)

## 2014-12-19 LAB — CBC
HEMATOCRIT: 36.8 % — AB (ref 39.0–52.0)
Hemoglobin: 13.1 g/dL (ref 13.0–17.0)
MCH: 31.3 pg (ref 26.0–34.0)
MCHC: 35.6 g/dL (ref 30.0–36.0)
MCV: 88 fL (ref 78.0–100.0)
Platelets: 26 10*3/uL — CL (ref 150–400)
RBC: 4.18 MIL/uL — ABNORMAL LOW (ref 4.22–5.81)
RDW: 14 % (ref 11.5–15.5)
WBC: 7.2 10*3/uL (ref 4.0–10.5)

## 2014-12-19 LAB — ACETAMINOPHEN LEVEL: Acetaminophen (Tylenol), Serum: <10 ug/mL — ABNORMAL LOW (ref 10–30)

## 2014-12-19 LAB — ETHANOL: Alcohol, Ethyl (B): 380 mg/dL (ref <5)

## 2014-12-19 LAB — SALICYLATE LEVEL

## 2014-12-19 MED ORDER — ONDANSETRON 4 MG PO TBDP
8.0000 mg | ORAL_TABLET | Freq: Once | ORAL | Status: AC
  Administered 2014-12-19: 8 mg via ORAL
  Filled 2014-12-19: qty 2

## 2014-12-19 NOTE — ED Notes (Signed)
Patient has been drinking since yesterday am, patient wants detox.  Patient with stable vital signs per EMS.

## 2014-12-19 NOTE — ED Provider Notes (Signed)
CSN: [id number]     Arrival date & time 12/19/14  0308 History  This chart was scribed for Jordan Bilis, MD by Merlene Laughter, ED Scribe. This patient was seen in room BH04C/BH04C and the patient's care was started at 3:33 AM.    Chief Complaint  Patient presents with  . Alcohol Intoxication      The history is provided by the patient. No language interpreter was used.    HPI Comments: Commodore Montemurro is a 44 y.o. male with a PMHx of alcohol abuse who presents to the Emergency Department complaining of alcohol intoxication and requesting detox.  Patient's last ETOH intake was 2-3 PM. He reports associated nausea. He denies any other associated symptoms. He states he has attempted detox in the past.    Past Medical History  Diagnosis Date  . Alcohol abuse   . Depression   . Bipolar 1 disorder, depressed    Past Surgical History  Procedure Laterality Date  . No past surgeries     Family History  Problem Relation Age of Onset  . Hypertension Mother   . Cancer Father   . Heart failure Father    History  Substance Use Topics  . Smoking status: Current Every Day Smoker  . Smokeless tobacco: Not on file  . Alcohol Use: Yes     Comment: daily - "whatever I can get" - 1/5 plus daily    Review of Systems  A complete 10 system review of systems was obtained and all systems are negative except as noted in the HPI and PMH.    Allergies  Review of patient's allergies indicates no known allergies.  Home Medications   Prior to Admission medications   Medication Sig Start Date End Date Taking? Authorizing Provider  amoxicillin-clavulanate (AUGMENTIN) 875-125 MG per tablet Take 1 tablet by mouth 2 (two) times daily.    Historical Provider, MD  chlordiazePOXIDE (LIBRIUM) 25 MG capsule 50mg  PO TID x 1D, then 25-50mg  PO BID X 1D, then 25-50mg  PO QD X 1D Patient not taking: Reported on 10/25/2014 10/24/14   Jordan Bilis, MD  FLUoxetine (PROZAC) 20 MG capsule Take 20 mg by mouth daily.     Historical Provider, MD  gabapentin (NEURONTIN) 300 MG capsule Take 300 mg by mouth 3 (three) times daily.    Historical Provider, MD  meloxicam (MOBIC) 15 MG tablet Take 15 mg by mouth daily.    Historical Provider, MD  OLANZapine (ZYPREXA) 10 MG tablet Take 10 mg by mouth at bedtime.    Historical Provider, MD  Soft Lens Products (RENU MULTIPLUS LUB/REWETTING) SOLN Place 1 drop into both eyes 4 (four) times daily as needed (dry eyes).    Historical Provider, MD  traZODone (DESYREL) 100 MG tablet Take 150 mg by mouth at bedtime.     Historical Provider, MD   Triage Vitals: BP 129/84 mmHg  Pulse 96  Temp(Src) 98.2 F (36.8 C) (Oral)  SpO2 100% Physical Exam  Constitutional: He is oriented to person, place, and time. He appears well-developed and well-nourished.  HENT:  Head: Normocephalic.  Eyes: EOM are normal.  Neck: Normal range of motion.  Pulmonary/Chest: Effort normal.  Abdominal: He exhibits no distension.  Musculoskeletal: Normal range of motion.  Neurological: He is alert and oriented to person, place, and time.  Psychiatric: He has a normal mood and affect.  Nursing note and vitals reviewed.   ED Course  Procedures  DIAGNOSTIC STUDIES: Oxygen Saturation is 100% on room air, normal by my  interpretation.    COORDINATION OF CARE: 3:34 AM- Discussed plans to order diagnostic lab work and will give patient Zofran. Pt advised of plan for treatment and pt agrees.  Labs Review Labs Reviewed  ACETAMINOPHEN LEVEL - Abnormal; Notable for the following:    Acetaminophen (Tylenol), Serum <10 (*)    All other components within normal limits  COMPREHENSIVE METABOLIC PANEL - Abnormal; Notable for the following:    Sodium 133 (*)    Chloride 86 (*)    Glucose, Bld 139 (*)    AST 311 (*)    ALT 118 (*)    Anion gap 18 (*)    All other components within normal limits  ETHANOL - Abnormal; Notable for the following:    Alcohol, Ethyl (B) 380 (*)    All other components within  normal limits  SALICYLATE LEVEL  CBC  URINE RAPID DRUG SCREEN, HOSP PERFORMED    Imaging Review No results found.   EKG Interpretation None      MDM   Final diagnoses:  Alcohol abuse    Medically clear. Dc home in good condition. Outpatient resources given  I personally performed the services described in this documentation, which was scribed in my presence. The recorded information has been reviewed and is accurate.       Jordan Bilis, MD 12/19/14 [id number]

## 2014-12-19 NOTE — Discharge Instructions (Signed)
°Emergency Department Resource Guide °1) Find a Doctor and Pay Out of Pocket °Although you won't have to find out who is covered by your insurance plan, it is a good idea to ask around and get recommendations. You will then need to call the office and see if the doctor you have chosen will accept you as a new patient and what types of options they offer for patients who are self-pay. Some doctors offer discounts or will set up payment plans for their patients who do not have insurance, but you will need to ask so you aren't surprised when you get to your appointment. ° °2) Contact Your Local Health Department °Not all health departments have doctors that can see patients for sick visits, but many do, so it is worth a call to see if yours does. If you don't know where your local health department is, you can check in your phone book. The CDC also has a tool to help you locate your state's health department, and many state websites also have listings of all of their local health departments. ° °3) Find a Walk-in Clinic °If your illness is not likely to be very severe or complicated, you may want to try a walk in clinic. These are popping up all over the country in pharmacies, drugstores, and shopping centers. They're usually staffed by nurse practitioners or physician assistants that have been trained to treat common illnesses and complaints. They're usually fairly quick and inexpensive. However, if you have serious medical issues or chronic medical problems, these are probably not your best option. ° °No Primary Care Doctor: °- Call [facility] at  832-8000 - they can help you locate a primary care doctor that  accepts your insurance, provides certain services, etc. °- Physician Referral Service- 1-800-533-3463 ° °Chronic Pain Problems: °Organization         Address  Phone   Notes  °[facility]  (336) 297-2271 Patients need to be referred by their primary care doctor.  ° °Medication  Assistance: °Organization         Address  Phone   Notes  °[place] [facility] [address] °[place], [place] [address] (336) 641-8030 --Must be a resident of [place] °-- Must have NO insurance coverage whatsoever (no Medicaid/ Medicare, etc.) °-- The pt. MUST have a primary care doctor that directs their care regularly and follows them in the community °  °[facility]  (866) 331-1348   °[employer]  (888) 892-1162   ° °Agencies that provide inexpensive medical care: °Organization         Address  Phone   Notes  °[facility]  (336) 832-8035   °[facility]    (336) 832-7272   °[facility] [address] °[place], [place] [address] (336) 832-4777   °[facility] [address], °[place] (336) 271-4999   °[facility]    (336) 373-0678   °[facility]    (336) 272-1050   °[facility] ° [address], [place] Phone:  (336) 832-4444, Fax:  (336) 832-4440 Hours of Operation:  9 am - 6 pm, M-F.  Also accepts Medicaid/Medicare and self-pay.  °[facility] ° [address], [place] Phone: (336) 832-3150, Fax: (336) 832-3151. Hours of Operation:  8:30 am - 5:30 pm, M-F.  Also accepts Medicaid and self-pay.  °[facility] [place] [address], [place] Phone: (336) 878-6027   °[facility] [address], [place], [place] (336)723-1848, Ext. 123 Mondays & Thursdays: 7-9 AM.  First 15 patients are seen on a first come, first serve basis. °  ° °Medicaid-accepting [place] Providers: ° °Organization         Address  Phone   Notes  °[facility] [address], [place] (336) 641-2100 Also accepts self-pay patients.  °[facility] [address], [place] ° (336) 856-9996   °[facility] [address], [place]  (336) 288-8857   °[facility] [address] [place] [address], [place] (336) 299-7000   °Veita Bland [address], [place]  ° (336) 373-1557 Only accepts [place] Access Medicaid patients after they have their name applied to their card.  ° °Self-Pay (no insurance) in [place]: ° °Organization         Address  Phone   Notes  °Sickle Cell Patients, [facility] [address], [place] (336) 832-1970   °[facility] [address], [place] (336) 832-4400   °[facility] ° [address], [place] (336) 992-4800   °[facility]/Dr. Osei-Bonsu ° [address] [place] [address], [place] or [address], [place] (336) 841-8500 Phone number for both [place] and [place] locations is the same.  °[facility] [address], [place] (336) 299-0000   °[facility] [address] [place] [address], [place] or [address] (336) 852-7530 °(336) 878-2260   °[facility] [address], [place] (336) 350-1642, phone; (336) 294-5005, fax Sees patients 1st and 3rd Saturday of every month.  Must not qualify for public or private insurance (i.e. Medicaid, Medicare, [place] Health Choice, Veterans' Benefits) • Household income should be no more than 200% of the poverty level •The clinic cannot treat you if you are pregnant or think you are pregnant • Sexually transmitted diseases are not treated at the clinic.  ° ° °Dental Care: °Organization         Address  Phone  Notes  °[place] [facility] [address], [place] (336) 641-6152 Accepts children up to age 21 who are enrolled in Medicaid or [place] Health Choice; pregnant women with a Medicaid card; and children who have applied for Medicaid or [place] Health Choice, but were declined, whose parents can pay a reduced fee at time of service.  °[place]  [facility] [place]  [address], [place] (336) 641-7733 Accepts children up to age 21 who are enrolled in Medicaid or [place] Health Choice; pregnant women with a Medicaid card; and children who have applied for Medicaid or [place] Health Choice, but were declined, whose parents can pay a reduced fee at time of service.  °[facility] ° [address],  (336) 641-4533 Patients are seen by appointment only. Walk-ins are not accepted. [facility] will see patients 18 years of age and older. °Monday - Tuesday (8am-5pm) °Most Wednesdays (8:30-5pm) °$30 per visit, cash only  °[facility] ° [address], [place] (336) 641-4533 Patients are seen by appointment only. Walk-ins are not accepted. [facility] will see patients 18 years of age and older. °One   Wednesday Evening (Monthly: Volunteer Based).  $30 per visit, cash only  °[facility]  (919) 537-3737 for adults; Children under age 4, call Graduate Pediatric Dentistry at (919) [phone]. Children aged 4-14, please call (919) [phone] to request a pediatric application. ° Dental services are provided in all areas of dental care including fillings, crowns and bridges, complete and partial dentures, implants, gum treatment, root canals, and extractions. Preventive care is also provided. Treatment is provided to both adults and children. °Patients are selected via a lottery and there is often a waiting list. °  °[facility] [address], °[place] ° (336) 763-8833 [email or web address] °  °[facility] [address], [place], [place] (336)723-1848, Ext. 123 Second and Fourth Thursday of each month, opens at 6:30 AM; Clinic ends at 9 AM.  Patients are seen on a first-come first-served basis, and a limited number are seen during each clinic.  ° °[facility] ° [address], [place], [place] (336) 723-7904    Eligibility Requirements °You must have lived in [place], [place], or [place] counties for at least the last three months. °  You cannot be eligible for state or federal sponsored healthcare insurance, including Veterans Administration, Medicaid, or Medicare. °  You generally cannot be eligible for healthcare insurance through your employer.  °  How to apply: °Eligibility screenings are held every Tuesday and Wednesday afternoon from 1:00 pm until 4:00 pm. You do not need an appointment for the interview!  °[facility] [address], [place], [place] 336-631-2330   °[facility]  336-342-8273   °[facility]  336-703-3100   °[facility]  336-570-6415   ° °Behavioral Health Resources in the Community: °Intensive Outpatient Programs °Organization         Address  Phone  Notes  °[facility] [address], [place], [place] 336-878-6098   °[facility] [address], [place], [place] 336-832-9800   °[facility] [address], [place], [place] ° 336-882-2125   °[facility] [address],  °[place], [place] 1-800-853-5163 or 336-641-4981   °Substance Abuse Resources °Organization         Address  Phone  Notes  °[facility]  336-882-2125   °[facility]  336-784-9470   °[facility]  336-285-9073   °[facility]  336-845-3988   °Residential & Outpatient Substance Abuse Program  1-800-659-3381   °Psychological Services °Organization         Address  Phone  Notes  °[facility]  336- 832-9600   °[facility]  336- 378-7881   °[facility] [address], [place] 1-800-853-5163 or 336-641-4981   ° °Mobile Crisis Teams °Organization         Address  Phone  Notes  °[facility], Mobile Crisis Care Unit  1-877-626-1772   °Assertive °[facility] ° [address]  [place], [place] 336-834-9664   °Sharon DeEsch [address] °[place] 336-554-5454   ° °Self-Help/Support Groups °Organization         Address  Phone             Notes  °[facility]  - variety of support groups  336- 373-1402 Call for more information  °Narcotics Anonymous (NA), [facility] [address], °[place]  2 meetings at this location  ° °  Residential Treatment Programs °Organization         Address  Phone  Notes  °[facility] [address],    °[place]  1-866-801-8205   °[facility] ° [address], [place], [place] 704-293-8524   °[facility] [address], [place] 336-845-3988 Admissions: 8am-3pm M-F  °[facility] [address]    °[place], [place] 336-841-1104   °[facility] [address], [place], [place] 336-379-7146   °[facility] [address]  °[place], [place] 336-285-9073   °[facility] - Intensive Outpatient [address], [place], [place] 336-852-3033   °[facility] [address]  °[place], [place] 1-877-615-2722 or 336-784-9470   °[facility] [address] [place], [place] 336-227-7417 Accepts Medicaid  °[facility] [address]  ° [place] 1-800-659-3381 Substance Abuse/Addiction Treatment  ° °[place] Behavioral Health Resources °Organization         Address  Phone  Notes  °[facility]  (888) 581-9988   °Julie Brannon, PhD [address] [place], [place]   (336) 349-5553 or (336) 951-0000   °[facility]   [address] °[place], [place] (336) 349-4454   °[facility] [address], [place], [place] (336) 342-8316 Insurance/Medicaid/sponsorship through [facility]  °[facility] [address]                                    [place], [place] (336) 342-8316 Therapy/tele-psych/case    °[facility] [address]  ° [place],  (336) 349-2233    °Dr. Arfeen  (336) 349-4544   °[facility] [place]  [employer] [place] [facility] 1) [address], [place] °2) [address], [place] °3)  [address], [place] (336) 349-3220 °(336) 342-7768 ° °(336) 342-8140   °[place] [facility] (336) 342-1394 or (336) 342-3537 (After Hours)    ° ° °

## 2014-12-22 ENCOUNTER — Encounter (HOSPITAL_COMMUNITY): Payer: Self-pay | Admitting: *Deleted

## 2014-12-22 ENCOUNTER — Emergency Department (HOSPITAL_COMMUNITY)
Admission: EM | Admit: 2014-12-22 | Discharge: 2014-12-23 | Disposition: A | Discharge status: Home / Self Care | Payer: Self-pay | Attending: Emergency Medicine | Admitting: Emergency Medicine

## 2014-12-22 DIAGNOSIS — F10120 Alcohol abuse with intoxication, uncomplicated: Secondary | ICD-10-CM | POA: Insufficient documentation

## 2014-12-22 DIAGNOSIS — F10920 Alcohol use, unspecified with intoxication, uncomplicated: Secondary | ICD-10-CM

## 2014-12-22 DIAGNOSIS — F319 Bipolar disorder, unspecified: Secondary | ICD-10-CM | POA: Insufficient documentation

## 2014-12-22 DIAGNOSIS — Z720 Tobacco use: Secondary | ICD-10-CM | POA: Insufficient documentation

## 2014-12-22 DIAGNOSIS — Z79899 Other long term (current) drug therapy: Secondary | ICD-10-CM | POA: Insufficient documentation

## 2014-12-22 DIAGNOSIS — Z791 Long term (current) use of non-steroidal anti-inflammatories (NSAID): Secondary | ICD-10-CM | POA: Insufficient documentation

## 2014-12-22 NOTE — ED Notes (Signed)
Pt arrives to the ER via EMS for ETOH intoxication; pt was seen stumbling on the street; pt denies SI / HI

## 2014-12-22 NOTE — ED Notes (Signed)
Bed: [facility]PT3 Expected date: 12/22/14 Expected time: 10:28 PM Means of arrival: Ambulance Comments: DRUNK with [employer]

## 2014-12-22 NOTE — ED Notes (Signed)
Pt presents to ED via EMS c/o ETOH intoxication.  [employer] saw him stumbling down the sidewalk and called EMS for assistance.  The pt is unaware of how much or when he drank today.  No active vomiting.  BP 140/90, HR 114, 92% oxygen, CBG 90.  Breathing normally.  Not combative.

## 2014-12-23 NOTE — ED Provider Notes (Signed)
CSN: [id number]     Arrival date & time 12/22/14  2238 History   First MD Initiated Contact with Patient 12/23/14 [phone]     Chief Complaint  Patient presents with  . Alcohol Intoxication     (Consider location/radiation/quality/duration/timing/severity/associated sxs/prior Treatment) HPI Patient brought in by EMS after being found intoxicated in the street by [employer]. Patient states he's been drinking heavily this evening. He denies any pain at this time. He denies any other coingestants. Denies any head or neck trauma. Multiple presentations to the emergency department for the same. Past Medical History  Diagnosis Date  . Alcohol abuse   . Depression   . Bipolar 1 disorder, depressed    Past Surgical History  Procedure Laterality Date  . No past surgeries     Family History  Problem Relation Age of Onset  . Hypertension Mother   . Cancer Father   . Heart failure Father    History  Substance Use Topics  . Smoking status: Current Every Day Smoker  . Smokeless tobacco: Not on file  . Alcohol Use: Yes     Comment: daily - "whatever I can get" - 1/5 plus daily    Review of Systems  Constitutional: Negative for fever and chills.  Respiratory: Negative for shortness of breath.   Cardiovascular: Negative for chest pain.  Gastrointestinal: Negative for nausea, vomiting and abdominal pain.  Musculoskeletal: Negative for back pain and neck pain.  Skin: Negative for rash.  Neurological: Negative for dizziness, weakness, light-headedness and headaches.  All other systems reviewed and are negative.     Allergies  Review of patient's allergies indicates no known allergies.  Home Medications   Prior to Admission medications   Medication Sig Start Date End Date Taking? Authorizing Provider  FLUoxetine (PROZAC) 20 MG capsule Take 20 mg by mouth daily.   Yes Historical Provider, MD  gabapentin (NEURONTIN) 300 MG capsule Take 300 mg by mouth 3 (three) times daily.   Yes Historical  Provider, MD  meloxicam (MOBIC) 15 MG tablet Take 15 mg by mouth daily.   Yes Historical Provider, MD  OLANZapine (ZYPREXA) 10 MG tablet Take 10 mg by mouth at bedtime.   Yes Historical Provider, MD  Soft Lens Products (RENU MULTIPLUS LUB/REWETTING) SOLN Place 1 drop into both eyes 4 (four) times daily as needed (dry eyes).   Yes Historical Provider, MD  traZODone (DESYREL) 100 MG tablet Take 150 mg by mouth at bedtime.    Yes Historical Provider, MD  chlordiazePOXIDE (LIBRIUM) 25 MG capsule  PO TID x 1D, then 25-50mg  PO BID X 1D, then 25-50mg  PO QD X 1D Patient not taking: Reported on 10/25/2014 10/24/14   Azalia Bilis, MD   BP 127/90 mmHg  Pulse 95  Temp(Src) 97.8 F (36.6 C) (Oral)  Resp 18  SpO2 95% Physical Exam  Constitutional: He is oriented to person, place, and time. He appears well-developed and well-nourished. No distress.  HENT:  Head: Normocephalic and atraumatic.  Mouth/Throat: Oropharynx is clear and moist.  Eyes: EOM are normal. Pupils are equal, round, and reactive to light.  Neck: Normal range of motion. Neck supple.  No posterior midline cervical tenderness to palpation.  Cardiovascular: Normal rate and regular rhythm.   Pulmonary/Chest: Effort normal and breath sounds normal. No respiratory distress. He has no wheezes. He has no rales.  Abdominal: Soft. Bowel sounds are normal.  Musculoskeletal: Normal range of motion. He exhibits no edema or tenderness.  Neurological: He is oriented to person, place,  and time.  Drowsy but arousable. Moves all extremities without deficit. Sensation is grossly intact.  Skin: Skin is warm and dry. No rash noted. No erythema.  Psychiatric: He has a normal mood and affect. His behavior is normal.  Nursing note and vitals reviewed.   ED Course  Procedures (including critical care time) Labs Review Labs Reviewed - No data to display  Imaging Review No results found.   EKG Interpretation None      MDM   Final diagnoses:   Alcohol intoxication, uncomplicated   Patient is ambulating without difficulty. He is more alert.  Ate sandwich in the emergency department. We'll discharge home.     Loren Raceravid Emberlyn Burlison, MD 12/23/14 [phone]

## 2014-12-23 NOTE — Discharge Instructions (Signed)
°Emergency Department Resource Guide °1) Find a Doctor and Pay Out of Pocket °Although you won't have to find out who is covered by your insurance plan, it is a good idea to ask around and get recommendations. You will then need to call the office and see if the doctor you have chosen will accept you as a new patient and what types of options they offer for patients who are self-pay. Some doctors offer discounts or will set up payment plans for their patients who do not have insurance, but you will need to ask so you aren't surprised when you get to your appointment. ° °2) Contact Your Local Health Department °Not all health departments have doctors that can see patients for sick visits, but many do, so it is worth a call to see if yours does. If you don't know where your local health department is, you can check in your phone book. The CDC also has a tool to help you locate your state's health department, and many state websites also have listings of all of their local health departments. ° °3) Find a Walk-in Clinic °If your illness is not likely to be very severe or complicated, you may want to try a walk in clinic. These are popping up all over the country in pharmacies, drugstores, and shopping centers. They're usually staffed by nurse practitioners or physician assistants that have been trained to treat common illnesses and complaints. They're usually fairly quick and inexpensive. However, if you have serious medical issues or chronic medical problems, these are probably not your best option. ° °No Primary Care Doctor: °- Call [facility] at  832-8000 - they can help you locate a primary care doctor that  accepts your insurance, provides certain services, etc. °- Physician Referral Service- 1-800-533-3463 ° °Chronic Pain Problems: °Organization         Address  Phone   Notes  °[facility]  (336) 297-2271 Patients need to be referred by their primary care doctor.  ° °Medication  Assistance: °Organization         Address  Phone   Notes  °[place] [facility] [address] °[place], [place] [address] (336) 641-8030 --Must be a resident of [place] °-- Must have NO insurance coverage whatsoever (no Medicaid/ Medicare, etc.) °-- The pt. MUST have a primary care doctor that directs their care regularly and follows them in the community °  °[facility]  (866) 331-1348   °[employer]  (888) 892-1162   ° °Agencies that provide inexpensive medical care: °Organization         Address  Phone   Notes  °[facility]  (336) 832-8035   ° Internal Medicine    (336) 832-7272   °[facility] [address] °[place], [place] [address] (336) 832-4777   °[facility] [address], °[place] (336) 271-4999   °[facility]    (336) 373-0678   °[facility]    (336) 272-1050   °[facility] ° [address], [place] Phone:  (336) 832-4444, Fax:  (336) 832-4440 Hours of Operation:  9 am - 6 pm, M-F.  Also accepts Medicaid/Medicare and self-pay.  °[facility] ° [address], [place] Phone: (336) 832-3150, Fax: (336) 832-3151. Hours of Operation:  8:30 am - 5:30 pm, M-F.  Also accepts Medicaid and self-pay.  °[facility] [place] [address], [place] Phone: (336) 878-6027   °[facility] [address], [place], [place] (336)723-1848, Ext. 123 Mondays & Thursdays: 7-9 AM.  First 15 patients are seen on a first come, first serve basis. °  ° °Medicaid-accepting [place] Providers: ° °Organization         Address  Phone   Notes  °[facility] [address], [place] (336) 641-2100 Also accepts self-pay patients.  °[facility] [address], [place] ° (336) 856-9996   °[facility] [address], [place]  (336) 288-8857   °[facility] [address] [place] [address], [place] (336) 299-7000   °Veita Bland [address], [place]  ° (336) 373-1557 Only accepts [place] Access Medicaid patients after they have their name applied to their card.  ° °Self-Pay (no insurance) in [place]: ° °Organization         Address  Phone   Notes  °Sickle Cell Patients, [facility] [address], [place] (336) 832-1970   °[facility] [address], [place] (336) 832-4400   °[facility] ° [address], [place] (336) 992-4800   °[facility]/Dr. Osei-Bonsu ° [address] [place] [address], [place] or [address], [place] (336) 841-8500 Phone number for both [place] and [place] locations is the same.  °[facility] [address], [place] (336) 299-0000   °[facility] [address] [place] [address], [place] or [address] (336) 852-7530 °(336) 878-2260   °[facility] [address], [place] (336) 350-1642, phone; (336) 294-5005, fax Sees patients 1st and 3rd Saturday of every month.  Must not qualify for public or private insurance (i.e. Medicaid, Medicare, [place] Health Choice, Veterans' Benefits) • Household income should be no more than 200% of the poverty level •The clinic cannot treat you if you are pregnant or think you are pregnant • Sexually transmitted diseases are not treated at the clinic.  ° ° °Dental Care: °Organization         Address  Phone  Notes  °[place] [facility] [address], [place] (336) 641-6152 Accepts children up to age 21 who are enrolled in Medicaid or [place] Health Choice; pregnant women with a Medicaid card; and children who have applied for Medicaid or [place] Health Choice, but were declined, whose parents can pay a reduced fee at time of service.  °[place]  [facility] [place]  [address], [place] (336) 641-7733 Accepts children up to age 21 who are enrolled in Medicaid or [place] Health Choice; pregnant women with a Medicaid card; and children who have applied for Medicaid or  Health Choice, but were declined, whose parents can pay a reduced fee at time of service.  °[facility] ° [address], [place] (336) 641-4533 Patients are seen by appointment only. Walk-ins are not accepted. [facility] will see patients 18 years of age and older. °Monday - Tuesday (8am-5pm) °Most Wednesdays (8:30-5pm) °$30 per visit, cash only  °[facility] ° [address], [place] (336) 641-4533 Patients are seen by appointment only. Walk-ins are not accepted. [facility] will see patients 18 years of age and older. °One   Wednesday Evening (Monthly: Volunteer Based).  $30 per visit, cash only  °[facility]  (919) 537-3737 for adults; Children under age 4, call Graduate Pediatric Dentistry at (919) [phone]. Children aged 4-14, please call (919) [phone] to request a pediatric application. ° Dental services are provided in all areas of dental care including fillings, crowns and bridges, complete and partial dentures, implants, gum treatment, root canals, and extractions. Preventive care is also provided. Treatment is provided to both adults and children. °Patients are selected via a lottery and there is often a waiting list. °  °[facility] [address], °[place] ° (336) 763-8833 [email or web address] °  °[facility] [address], [place], [place] (336)723-1848, Ext. 123 Second and Fourth Thursday of each month, opens at 6:30 AM; Clinic ends at 9 AM.  Patients are seen on a first-come first-served basis, and a limited number are seen during each clinic.  ° °[facility] ° [address], [place], [place] (336) 723-7904    Eligibility Requirements °You must have lived in [place], or [place] counties for at least the last three months. °  You cannot be eligible for state or federal sponsored healthcare insurance, including Veterans Administration, Medicaid, or Medicare. °  You generally cannot be eligible for healthcare insurance through your employer.  °  How to apply: °Eligibility screenings are held every Tuesday and Wednesday afternoon from 1:00 pm until 4:00 pm. You do not need an appointment for the interview!  °[facility] [address], [place], [place] 336-631-2330   °[place] [facility]  336-342-8273   °[place] [facility]  336-703-3100   °[facility]  336-570-6415   ° °Behavioral Health Resources in the Community: °Intensive Outpatient Programs °Organization         Address  Phone  Notes  °[facility] [address], [place], [place] 336-878-6098   °[facility] [address], [place], [place] 336-832-9800   °[facility] [address], [place], [place] ° 336-882-2125   °[facility] [address],  °[place], [place] 1-800-853-5163 or 336-641-4981   °Substance Abuse Resources °Organization         Address  Phone  Notes  °[facility]  336-882-2125   °[facility]  336-784-9470   °[facility]  336-285-9073   °[facility]  336-845-3988   °Residential & Outpatient Substance Abuse Program  1-800-659-3381   °Psychological Services °Organization         Address  Phone  Notes  °[facility]  336- 832-9600   °[facility]  336- 378-7881   °[facility] [address], [place] 1-800-853-5163 or 336-641-4981   ° °Mobile Crisis Teams °Organization         Address  Phone  Notes  °[facility], Mobile Crisis Care Unit  1-877-626-1772   °Assertive °[facility] ° [address]  [place], [place] 336-834-9664   °Sharon DeEsch [address] °[place] 336-554-5454   ° °Self-Help/Support Groups °Organization         Address  Phone             Notes  °[facility] - variety of support groups  336- 373-1402 Call for more information  °Narcotics Anonymous (NA), [facility] [address], °[place]   2 meetings at this location  ° °  Residential Treatment Programs °Organization         Address  Phone  Notes  °[facility] [address],    °[place]  1-866-801-8205   °[facility] ° [address], [place], [place] 704-293-8524   °[facility] [address], [place] 336-845-3988 Admissions: 8am-3pm M-F  °[facility] [address]    °[place], [place] 336-841-1104   °[facility] [address], [place], [place] 336-379-7146   °[facility] [address]  °[place], [place] 336-285-9073   °[facility] - Intensive Outpatient [address], [place], [place] 336-852-3033   °[facility] [address]  °[place], [place] 1-877-615-2722 or 336-784-9470   °[facility] [address] [place], [place] 336-227-7417 Accepts Medicaid  °[facility] [address]  ° [place] 1-800-659-3381 Substance Abuse/Addiction Treatment  ° °[place] Behavioral Health Resources °Organization         Address  Phone  Notes  °[facility]  (888) 581-9988   °Julie Brannon, PhD [address] [place], [place]   (336) 349-5553 or (336) 951-0000   ° Behavioral   [address] °[place], [place] (336) 349-4454   °[facility] [address], [place], [place] (336) 342-8316 Insurance/Medicaid/sponsorship through [facility]  °[facility] [address]                                    [place], [place] (336) 342-8316 Therapy/tele-psych/case    °[facility] [address]  ° [place], [place] (336) 349-2233    °Dr. Arfeen  (336) 349-4544   °[facility] [place]  [employer] [place] [facility] 1) [address], [place] °2) [address], [place] °3)  [address], [place] (336) 349-3220 °(336) 342-7768 ° °(336) 342-8140   °[place] [facility] (336) 342-1394 or (336) 342-3537 (After Hours)    ° ° °

## 2014-12-23 NOTE — ED Notes (Signed)
Pt ambulated to restroom with steady gait twice. Given PO fluids per request. Appears to be resting on stretcher, respirations even and unlabored, skin warm and dry, denies further needs, in NAD. Will continue to monitor.

## 2015-02-11 ENCOUNTER — Encounter (HOSPITAL_COMMUNITY): Payer: Self-pay | Admitting: Emergency Medicine

## 2015-02-11 ENCOUNTER — Emergency Department (HOSPITAL_COMMUNITY)
Admission: EM | Admit: 2015-02-11 | Discharge: 2015-02-13 | Disposition: A | Discharge status: Home / Self Care | Payer: Self-pay | Attending: Emergency Medicine | Admitting: Emergency Medicine

## 2015-02-11 DIAGNOSIS — F32A Depression, unspecified: Secondary | ICD-10-CM

## 2015-02-11 DIAGNOSIS — F10230 Alcohol dependence with withdrawal, uncomplicated: Secondary | ICD-10-CM | POA: Diagnosis present

## 2015-02-11 DIAGNOSIS — F319 Bipolar disorder, unspecified: Secondary | ICD-10-CM | POA: Diagnosis present

## 2015-02-11 DIAGNOSIS — Z720 Tobacco use: Secondary | ICD-10-CM | POA: Insufficient documentation

## 2015-02-11 DIAGNOSIS — F329 Major depressive disorder, single episode, unspecified: Secondary | ICD-10-CM | POA: Insufficient documentation

## 2015-02-11 DIAGNOSIS — F10120 Alcohol abuse with intoxication, uncomplicated: Secondary | ICD-10-CM | POA: Insufficient documentation

## 2015-02-11 DIAGNOSIS — F10920 Alcohol use, unspecified with intoxication, uncomplicated: Secondary | ICD-10-CM

## 2015-02-11 DIAGNOSIS — Z79899 Other long term (current) drug therapy: Secondary | ICD-10-CM | POA: Insufficient documentation

## 2015-02-11 LAB — CBC
HCT: 45.1 % (ref 39.0–52.0)
HEMOGLOBIN: 16.2 g/dL (ref 13.0–17.0)
MCH: 32.5 pg (ref 26.0–34.0)
MCHC: 35.9 g/dL (ref 30.0–36.0)
MCV: 90.4 fL (ref 78.0–100.0)
Platelets: 124 10*3/uL — ABNORMAL LOW (ref 150–400)
RBC: 4.99 MIL/uL (ref 4.22–5.81)
RDW: 14 % (ref 11.5–15.5)
WBC: 6.1 10*3/uL (ref 4.0–10.5)

## 2015-02-11 LAB — COMPREHENSIVE METABOLIC PANEL
ALT: 42 U/L (ref 17–63)
AST: 88 U/L — ABNORMAL HIGH (ref 15–41)
Albumin: 4.5 g/dL (ref 3.5–5.0)
Alkaline Phosphatase: 79 U/L (ref 38–126)
Anion gap: 14 (ref 5–15)
BUN: 7 mg/dL (ref 6–20)
CHLORIDE: 96 mmol/L — AB (ref 101–111)
CO2: 24 mmol/L (ref 22–32)
Calcium: 8.9 mg/dL (ref 8.9–10.3)
Creatinine, Ser: 0.65 mg/dL (ref 0.61–1.24)
GFR calc non Af Amer: >60 mL/min (ref >60)
Glucose, Bld: 111 mg/dL — ABNORMAL HIGH (ref 65–99)
Potassium: 3.7 mmol/L (ref 3.5–5.1)
Sodium: 134 mmol/L — ABNORMAL LOW (ref 135–145)
Total Bilirubin: 0.5 mg/dL (ref 0.3–1.2)
Total Protein: 7.4 g/dL (ref 6.5–8.1)

## 2015-02-11 LAB — RAPID URINE DRUG SCREEN, HOSP PERFORMED
Amphetamines: NOT DETECTED
Barbiturates: NOT DETECTED
Benzodiazepines: NOT DETECTED
Cocaine: NOT DETECTED
Opiates: NOT DETECTED
TETRAHYDROCANNABINOL: NOT DETECTED

## 2015-02-11 LAB — ETHANOL: Alcohol, Ethyl (B): 308 mg/dL (ref <5)

## 2015-02-11 LAB — ACETAMINOPHEN LEVEL: Acetaminophen (Tylenol), Serum: <10 ug/mL — ABNORMAL LOW (ref 10–30)

## 2015-02-11 LAB — SALICYLATE LEVEL

## 2015-02-11 MED ORDER — LORAZEPAM 1 MG PO TABS: 0.0000 mg | ORAL_TABLET | Freq: Two times a day (BID) | ORAL | Status: DC

## 2015-02-11 MED ORDER — VITAMIN B-1 100 MG PO TABS
100.0000 mg | ORAL_TABLET | Freq: Every day | ORAL | Status: DC
  Administered 2015-02-11 – 2015-02-13 (×3): 100 mg via ORAL
  Filled 2015-02-11 (×3): qty 1

## 2015-02-11 MED ORDER — THIAMINE HCL 100 MG/ML IJ SOLN: 100.0000 mg | Freq: Every day | INTRAMUSCULAR | Status: DC

## 2015-02-11 MED ORDER — NICOTINE 14 MG/24HR TD PT24
14.0000 mg | MEDICATED_PATCH | Freq: Every day | TRANSDERMAL | Status: DC
  Administered 2015-02-11 – 2015-02-13 (×3): 14 mg via TRANSDERMAL
  Filled 2015-02-11 (×3): qty 1

## 2015-02-11 MED ORDER — LORAZEPAM 1 MG PO TABS
0.0000 mg | ORAL_TABLET | Freq: Four times a day (QID) | ORAL | Status: DC
  Administered 2015-02-11: 1 mg via ORAL
  Administered 2015-02-11 – 2015-02-12 (×4): 2 mg via ORAL
  Administered 2015-02-13: 1 mg via ORAL
  Administered 2015-02-13: 2 mg via ORAL
  Filled 2015-02-11 (×4): qty 2
  Filled 2015-02-11: qty 1
  Filled 2015-02-11: qty 2
  Filled 2015-02-11: qty 1

## 2015-02-11 NOTE — [facility] Counselor (Signed)
Disposition: Per Hulan Fess, NP, pt meets inpt tx criteria. [facility] at capacity. TTS to seek placement.  Dr Lynelle Doctor notified of disposition.   Cyndie Mull, [facility] Triage Specialist [facility]

## 2015-02-11 NOTE — Progress Notes (Signed)
Referral made at: [facility]-  per Kevin,fax referral for the wait-list. [facility]- per Victorino Dike- fax referral. [facility]- per Renea Ee, fax it for review. [facility] - per Kathlene November, far referral for review but no beds tonight. [facility] - per Britta Mccreedy, at capacity but fax referral over. [facility]- attempted to reach multiple times but the phone connection failed.  At capacity: [facility] - accepting only male until further notice, per Britta Mccreedy. [facility] [place], [place] Disposition staff 02/11/2015 10:17 PM

## 2015-02-11 NOTE — ED Notes (Signed)
Pt changed into scrubs and wanded by security  

## 2015-02-11 NOTE — ED Notes (Signed)
Pt stated that he walked in front of a bus at 11am. He was stopped by a bystander. He then went to drink a few beers, before presenting to the ED and asking for help.Pt is alert, oriented and cooperative. Pt reminded by staff that he can not go out front to have a cigarette. He is focused on having a cigarette.

## 2015-02-11 NOTE — ED Provider Notes (Signed)
CSN: [id number]     Arrival date & time 02/11/15  1331 History   First MD Initiated Contact with Patient 02/11/15 1544     Chief Complaint  Patient presents with  . Alcohol Intoxication    pt stated that he has been drinking  . Suicide Attempt    pt stated that he walked in front of a bus   HPI Patient presents to the emergency room with complaints of suicidal ideation. Patient is a chronic alcoholic. He says he can't get himself to stop drinking. This has made him very upset and depressed. Patient was standing outside and decided he was going to step out in front of a bus. Patient states he was pulled back by a bystander outside. Patient decided to have a few more beers to drink before coming into the emergency room. He denies any trouble with recent illness or injury. Denies any fevers or chills. He still feels like killing himself because he has nothing to live for. Past Medical History  Diagnosis Date  . Alcohol abuse   . Depression   . Bipolar 1 disorder, depressed    Past Surgical History  Procedure Laterality Date  . No past surgeries     Family History  Problem Relation Age of Onset  . Hypertension Mother   . Cancer Father   . Heart failure Father    Social History  Substance Use Topics  . Smoking status: Current Every Day Smoker  . Smokeless tobacco: None  . Alcohol Use: Yes     Comment: daily - "whatever I can get" - 1/5 plus daily    Review of Systems  All other systems reviewed and are negative.     Allergies  Review of patient's allergies indicates no known allergies.  Home Medications   Prior to Admission medications   Medication Sig Start Date End Date Taking? Authorizing Provider  FLUoxetine (PROZAC) 20 MG capsule Take 20 mg by mouth daily.   Yes Historical Provider, MD  gabapentin (NEURONTIN) 300 MG capsule Take 300 mg by mouth 3 (three) times daily.   Yes Historical Provider, MD  OLANZapine (ZYPREXA) 10 MG tablet Take 10 mg by mouth at bedtime.    Yes Historical Provider, MD  Soft Lens Products (RENU MULTIPLUS LUB/REWETTING) SOLN Place 1 drop into both eyes 4 (four) times daily as needed (dry eyes).   Yes Historical Provider, MD  traZODone (DESYREL) 100 MG tablet Take 150 mg by mouth at bedtime.    Yes Historical Provider, MD  chlordiazePOXIDE (LIBRIUM) 25 MG capsule  PO TID x 1D, then 25-50mg  PO BID X 1D, then 25-50mg  PO QD X 1D Patient not taking: Reported on 10/25/2014 10/24/14   Azalia Bilis, MD   BP 116/64 mmHg  Pulse 98  Temp(Src) 98.2 F (36.8 C) (Oral)  Resp 20  SpO2 95% Physical Exam  Constitutional: No distress.  HENT:  Head: Normocephalic and atraumatic.  Right Ear: External ear normal.  Left Ear: External ear normal.  Eyes: Conjunctivae are normal. Right eye exhibits no discharge. Left eye exhibits no discharge. No scleral icterus.  Neck: Neck supple. No tracheal deviation present.  Cardiovascular: Normal rate, regular rhythm and intact distal pulses.   Pulmonary/Chest: Effort normal and breath sounds normal. No stridor. No respiratory distress. He has no wheezes. He has no rales.  Abdominal: Soft. Bowel sounds are normal. He exhibits no distension. There is no tenderness. There is no rebound and no guarding.  Musculoskeletal: He exhibits no edema or tenderness.  Neurological: He is alert. He has normal strength. He displays no tremor. No cranial nerve deficit (no facial droop, extraocular movements intact, no slurred speech) or sensory deficit. He exhibits normal muscle tone. He displays no seizure activity. Coordination normal.  Skin: Skin is warm and dry. No rash noted.  Psychiatric: His speech is not delayed and not tangential. He is slowed. He exhibits a depressed mood. He expresses suicidal ideation. He expresses suicidal plans.  Nursing note and vitals reviewed.   ED Course  Procedures (including critical care time) Labs Review Labs Reviewed  COMPREHENSIVE METABOLIC PANEL - Abnormal; Notable for the  following:    Sodium 134 (*)    Chloride 96 (*)    Glucose, Bld 111 (*)    AST 88 (*)    All other components within normal limits  ETHANOL - Abnormal; Notable for the following:    Alcohol, Ethyl (B) 308 (*)    All other components within normal limits  ACETAMINOPHEN LEVEL - Abnormal; Notable for the following:    Acetaminophen (Tylenol), Serum <10 (*)    All other components within normal limits  CBC - Abnormal; Notable for the following:    Platelets 124 (*)    All other components within normal limits  SALICYLATE LEVEL  URINE RAPID DRUG SCREEN, HOSP PERFORMED    Imaging Review No results found. I have personally reviewed and evaluated these images and lab results as part of my medical decision-making.   EKG Interpretation None      MDM   Final diagnoses:  Depression  Alcohol intoxication, uncomplicated    Pt has a care plan for frequent visits to the ED requesting detox.  Pt is saying his is suicidal.  Pt is also asking to go outside and have a smoke.  Explained to patient he will not be able to do that.  He also states he has not eaten in a few days and would like something to eat.  Pt was assessed by TTS.  He does meet inpatient criteria.  Plan is for admission.  Currently there are [facility] beds available at [facility].   Linwood Dibbles, MD 02/11/15 2104

## 2015-02-11 NOTE — Progress Notes (Signed)
CRITICAL VALUE ALERT  Critical value received:  Alcohol Level 308  Date of notification:  02/11/15  Time of notification:  1705  Critical value read back:Yes.    Nurse who received alert:  Georgia Lopes, RN    MD notified (1st page):  Dr. Lynelle Doctor  Time of first page:  1705  MD notified (2nd page):  Time of second page:  Responding MD:  Dr. Lynelle Doctor  Time MD responded:  [phone]
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
## 2015-02-11 NOTE — ED Notes (Signed)
1 pt belonging bag in locker #31 

## 2015-02-12 ENCOUNTER — Encounter (HOSPITAL_COMMUNITY): Payer: Self-pay | Admitting: Registered Nurse

## 2015-02-12 DIAGNOSIS — F319 Bipolar disorder, unspecified: Secondary | ICD-10-CM

## 2015-02-12 DIAGNOSIS — F329 Major depressive disorder, single episode, unspecified: Secondary | ICD-10-CM | POA: Insufficient documentation

## 2015-02-12 DIAGNOSIS — F10230 Alcohol dependence with withdrawal, uncomplicated: Secondary | ICD-10-CM

## 2015-02-12 DIAGNOSIS — Y909 Presence of alcohol in blood, level not specified: Secondary | ICD-10-CM

## 2015-02-12 DIAGNOSIS — F32A Depression, unspecified: Secondary | ICD-10-CM | POA: Insufficient documentation

## 2015-02-12 MED ORDER — GABAPENTIN 300 MG PO CAPS
300.0000 mg | ORAL_CAPSULE | Freq: Three times a day (TID) | ORAL | Status: DC
  Administered 2015-02-12 – 2015-02-13 (×3): 300 mg via ORAL
  Filled 2015-02-12 (×3): qty 1

## 2015-02-12 MED ORDER — TRAZODONE HCL 50 MG PO TABS
150.0000 mg | ORAL_TABLET | Freq: Every day | ORAL | Status: DC
  Administered 2015-02-12: 150 mg via ORAL
  Filled 2015-02-12 (×2): qty 1

## 2015-02-12 MED ORDER — OLANZAPINE 10 MG PO TABS
10.0000 mg | ORAL_TABLET | Freq: Every day | ORAL | Status: DC
  Administered 2015-02-12: 10 mg via ORAL
  Filled 2015-02-12: qty 1

## 2015-02-12 MED ORDER — ONDANSETRON 4 MG PO TBDP
4.0000 mg | ORAL_TABLET | Freq: Once | ORAL | Status: AC
  Administered 2015-02-12: 4 mg via ORAL
  Filled 2015-02-12: qty 1

## 2015-02-12 MED ORDER — FLUOXETINE HCL 20 MG PO CAPS
20.0000 mg | ORAL_CAPSULE | Freq: Every day | ORAL | Status: DC
  Administered 2015-02-12 – 2015-02-13 (×2): 20 mg via ORAL
  Filled 2015-02-12 (×2): qty 1

## 2015-02-12 MED ORDER — ONDANSETRON 4 MG PO TBDP
4.0000 mg | ORAL_TABLET | Freq: Three times a day (TID) | ORAL | Status: DC | PRN
  Administered 2015-02-12: 4 mg via ORAL
  Filled 2015-02-12: qty 1

## 2015-02-12 NOTE — [facility] Counselor (Signed)
Pt was referred to the following facilities with open beds: [facility]  Kateri Plummer, M.S., LPCA, [place], [facility] Licensed Professional Counselor Associate  Triage Specialist  [facility]  Therapeutic Triage Services Phone: [phone] Fax: [phone]

## 2015-02-12 NOTE — BH Assessment (Signed)
Jordan Davidson, [facility], says a bed is available for Pt, room 302-2 under the service of Dr. Geoffery Lyons. Notified Dr. Anitra Lauth of acceptance. Attempted to notify Pt's RN three times without success.   Jordan Davidson Patsy [place], LPC, [facility] Triage Specialist [phone]

## 2015-02-12 NOTE — ED Notes (Signed)
Patient reports that he vomited a small amount, states Feels better.

## 2015-02-12 NOTE — ED Notes (Signed)
Reports having nausea, notified Dr. Ellis Parents order noted

## 2015-02-12 NOTE — Progress Notes (Signed)
44 yr old [place] homeless pt with 42 ED visits in last 6 months [facility] ED care plan in place to be followed Pt seen by [facility] staff today and he informs her he is to follow up with [facility]  Resources offered for pcp services  Pt without major medical dx like copd, chf, dm etc that is active to be a candidate for [facility] TCC program No pcp not a [facility] candidate Pt wih hx of noncompliance with following up

## 2015-02-12 NOTE — Consult Note (Signed)
[place] Psychiatry Consult   Reason for Consult:  Suicidal Ideation Referring Physician:  WLED Patient Identification: Jordan Davidson MRN:  [id number] Principal Diagnosis: Alcohol dependence with uncomplicated withdrawal Diagnosis:   Patient Active Problem List   Diagnosis Date Noted  . Bipolar affective disorder [F31.9] 02/12/2015  . Alcohol dependence with uncomplicated withdrawal [X51.700] 08/26/2014    Total Time spent with patient: 45 minutes  Subjective:   Jordan Davidson is a 44 y.o. male patient presented to [facility] with complaints of alcohol/substance abuse and suicidal ideation.  HPI:  Patient states that he has sever depression "I had about nine drinks and tried to walk in front of a bus.  Patient states that his main stressor is his alcohol problem.  "I drink everyday all day usually a half fifth to two fifths a day with beer for chaser." Patient states that he has a history of alcohol seizure and DT where he was seeing pink elephants and green men "it was really scary." Patient states that he has a history of suicide attempt "around the early 2000; I took a bunch of pills" patient denies self injurious behavior.  Patient denies homicidal ideation, but states he does have violent behavior when he is intoxicated.  At this time patient denies psychosis and paranoia.  Patient states that he is homeless, unemployed.  HPI Elements:   Location:  Alcohol abuse/dependence. Quality:  Worsening depression. Severity:  Sever. Duration:  Several day.  Past Medical History:  Past Medical History  Diagnosis Date  . Alcohol abuse   . Depression   . Bipolar 1 disorder, depressed     Past Surgical History  Procedure Laterality Date  . No past surgeries     Family History:  Family History  Problem Relation Age of Onset  . Hypertension Mother   . Cancer Father   . Heart failure Father    Unaware of family psychiatric history  Social History:  History  Alcohol Use  . Yes   Comment: daily - "whatever I can get" - 1/5 plus daily     History  Drug Use No    Social History   Social History  . Marital Status: Single    Spouse Name: N/A  . Number of Children: N/A  . Years of Education: N/A   Social History Main Topics  . Smoking status: Current Every Day Smoker  . Smokeless tobacco: None  . Alcohol Use: Yes     Comment: daily - "whatever I can get" - 1/5 plus daily  . Drug Use: No  . Sexual Activity: Not Asked   Other Topics Concern  . None   Social History Narrative   Additional Social History:    Pain Medications: See PTA List Prescriptions: See PTA List Over the Counter: See PTA List History of alcohol / drug use?: Yes Longest period of sobriety (when/how long): Pt unable to recall Negative Consequences of Use: Financial, Legal, Personal relationships, Work / School Withdrawal Symptoms: Diarrhea, Sweats, DTs, Fever / Chills, Irritability, Tremors, Patient aware of relationship between substance abuse and physical/medical complications, Seizures Onset of Seizures: UTA Date of most recent seizure: May 2016 Name of Substance 1: Etoh 1 - Age of First Use: 7 1 - Amount (size/oz): "A fifth and a half and a few beers" 1 - Frequency: Daily 1 - Duration: 25-30 years 1 - Last Use / Amount: Today, 02/11/15                   Allergies:  No Known Allergies  Labs:  Results for orders placed or performed during the hospital encounter of 02/11/15 (from the past 48 hour(s))  Urine rapid drug screen (hosp performed) (Not at [facility])     Status: None   Collection Time: 02/11/15  3:39 PM  Result Value Ref Range   Opiates NONE DETECTED NONE DETECTED   Cocaine NONE DETECTED NONE DETECTED   Benzodiazepines NONE DETECTED NONE DETECTED   Amphetamines NONE DETECTED NONE DETECTED   Tetrahydrocannabinol NONE DETECTED NONE DETECTED   Barbiturates NONE DETECTED NONE DETECTED    Comment:        DRUG SCREEN FOR MEDICAL PURPOSES ONLY.  IF CONFIRMATION IS  NEEDED FOR ANY PURPOSE, NOTIFY LAB WITHIN 5 DAYS.        LOWEST DETECTABLE LIMITS FOR URINE DRUG SCREEN Drug Class       Cutoff (ng/mL) Amphetamine      1000 Barbiturate      200 Benzodiazepine   403 Tricyclics       474 Opiates          300 Cocaine          300 THC              50   Comprehensive metabolic panel     Status: Abnormal   Collection Time: 02/11/15  3:51 PM  Result Value Ref Range   Sodium 134 (L) 135 - 145 mmol/L   Potassium 3.7 3.5 - 5.1 mmol/L   Chloride 96 (L) 101 - 111 mmol/L   CO2 24 22 - 32 mmol/L   Glucose, Bld 111 (H) 65 - 99 mg/dL   BUN 7 6 - 20 mg/dL   Creatinine, Ser 0.65 0.61 - 1.24 mg/dL   Calcium 8.9 8.9 - 10.3 mg/dL   Total Protein 7.4 6.5 - 8.1 g/dL   Albumin 4.5 3.5 - 5.0 g/dL   AST 88 (H) 15 - 41 U/L   ALT 42 17 - 63 U/L   Alkaline Phosphatase 79 38 - 126 U/L   Total Bilirubin 0.5 0.3 - 1.2 mg/dL   GFR calc non Af Amer >60 >60 mL/min   GFR calc Af Amer >60 >60 mL/min    Comment: (NOTE) The eGFR has been calculated using the CKD EPI equation. This calculation has not been validated in all clinical situations. eGFR's persistently <60 mL/min signify possible Chronic Kidney Disease.    Anion gap 14 5 - 15  Ethanol (ETOH)     Status: Abnormal   Collection Time: 02/11/15  3:51 PM  Result Value Ref Range   Alcohol, Ethyl (B) 308 (HH) <5 mg/dL    Comment:        LOWEST DETECTABLE LIMIT FOR SERUM ALCOHOL IS 5 mg/dL FOR MEDICAL PURPOSES ONLY CRITICAL RESULT CALLED TO, READ BACK BY AND VERIFIED WITH: BROADEX T RN AT 1704 ON 8.18.16 BY MENDOZA B   Salicylate level     Status: None   Collection Time: 02/11/15  3:51 PM  Result Value Ref Range   Salicylate Lvl <2.5 2.8 - 30.0 mg/dL  Acetaminophen level     Status: Abnormal   Collection Time: 02/11/15  3:51 PM  Result Value Ref Range   Acetaminophen (Tylenol), Serum <10 (L) 10 - 30 ug/mL    Comment:        THERAPEUTIC CONCENTRATIONS VARY SIGNIFICANTLY. A RANGE OF 10-30 ug/mL MAY BE AN  EFFECTIVE CONCENTRATION FOR MANY PATIENTS. HOWEVER, SOME ARE BEST TREATED AT CONCENTRATIONS OUTSIDE THIS RANGE. ACETAMINOPHEN CONCENTRATIONS >  150 ug/mL AT 4 HOURS AFTER INGESTION AND >50 ug/mL AT 12 HOURS AFTER INGESTION ARE OFTEN ASSOCIATED WITH TOXIC REACTIONS.   CBC     Status: Abnormal   Collection Time: 02/11/15  3:51 PM  Result Value Ref Range   WBC 6.1 4.0 - 10.5 K/uL   RBC 4.99 4.22 - 5.81 MIL/uL   Hemoglobin 16.2 13.0 - 17.0 g/dL   HCT 45.1 39.0 - 52.0 %   MCV 90.4 78.0 - 100.0 fL   MCH 32.5 26.0 - 34.0 pg   MCHC 35.9 30.0 - 36.0 g/dL   RDW 14.0 11.5 - 15.5 %   Platelets 124 (L) 150 - 400 K/uL    Vitals: Blood pressure 162/97, pulse 104, temperature 98.6 F (37 C), temperature source Oral, resp. rate 20, SpO2 99 %.  Risk to Self: Suicidal Ideation: Yes-Currently Present Suicidal Intent: Yes-Currently Present Is patient at risk for suicide?: Yes Suicidal Plan?: Yes-Currently Present Specify Current Suicidal Plan: Pt attempted to walk in front of bus this evening Access to Means: Yes Specify Access to Suicidal Means: Access to traffic What has been your use of drugs/alcohol within the last 12 months?: Daily heavy Etoh use How many times?: 1 Other Self Harm Risks: Alcohol abuse for past 25-30 years Triggers for Past Attempts: Unknown Intentional Self Injurious Behavior: None Risk to Others: Homicidal Ideation: No Thoughts of Harm to Others: No Current Homicidal Intent: No Current Homicidal Plan: No Access to Homicidal Means: No Identified Victim: n/a History of harm to others?: No Assessment of Violence: None Noted Violent Behavior Description: Pt calm and cooperative; No known hx of violence besides reported charges in past for assault on government official Does patient have access to weapons?: No Criminal Charges Pending?: No Does patient have a court date: No Prior Inpatient Therapy: Prior Inpatient Therapy: Yes Prior Therapy Dates: Multiple Prior  Therapy Facilty/Provider(s): [facility], [facility] Reason for Treatment: SI/Depression, Alcohol Abuse Prior Outpatient Therapy: Prior Outpatient Therapy: No Prior Therapy Dates: na Prior Therapy Facilty/Provider(s): na Reason for Treatment: na Does patient have an ACCT team?: No Does patient have Intensive In-House Services?  : No Does patient have [facility] services? : No Does patient have P4CC services?: No  Current Facility-Administered Medications  Medication Dose Route Frequency Provider Last Rate Last Dose  . FLUoxetine (PROZAC) capsule 20 mg  20 mg Oral Daily Ivi Griffith   20 mg at 02/12/15 1413  . gabapentin (NEURONTIN) capsule 300 mg  300 mg Oral TID Ngai Parcell   300 mg at 02/12/15 1412  . LORazepam (ATIVAN) tablet 0-4 mg  0-4 mg Oral 4 times per day Dorie Rank, MD   2 mg at 02/12/15 1107   Followed by  . [START ON 02/13/2015] LORazepam (ATIVAN) tablet 0-4 mg  0-4 mg Oral Q12H Dorie Rank, MD      . nicotine (NICODERM CQ - dosed in mg/24 hours) patch 14 mg  14 mg Transdermal Daily Dorie Rank, MD   14 mg at 02/12/15 1107  . OLANZapine (ZYPREXA) tablet 10 mg  10 mg Oral QHS Albertia Carvin      . thiamine (VITAMIN B-1) tablet 100 mg  100 mg Oral Daily Dorie Rank, MD   100 mg at 02/12/15 1107   Or  . thiamine (B-1) injection 100 mg  100 mg Intravenous Daily Dorie Rank, MD      . traZODone (DESYREL) tablet 150 mg  150 mg Oral QHS Kahmya Pinkham       Current Outpatient Prescriptions  Medication Sig Dispense Refill  . FLUoxetine (PROZAC) 20 MG capsule Take 20 mg by mouth daily.    Marland Kitchen gabapentin (NEURONTIN) 300 MG capsule Take 300 mg by mouth 3 (three) times daily.    Marland Kitchen OLANZapine (ZYPREXA) 10 MG tablet Take 10 mg by mouth at bedtime.    . Soft Lens Products (RENU MULTIPLUS LUB/REWETTING) SOLN Place 1 drop into both eyes 4 (four) times daily as needed (dry eyes).    . traZODone (DESYREL) 100 MG tablet Take 150 mg by mouth at bedtime.     . chlordiazePOXIDE (LIBRIUM) 25 MG capsule 62m PO  TID x 1D, then 25-533mPO BID X 1D, then 25-5030mO QD X 1D (Patient not taking: Reported on 10/25/2014) 10 capsule 0    Musculoskeletal: Strength & Muscle Tone: within normal limits Gait & Station: normal Patient leans: N/A  Psychiatric Specialty Exam: Physical Exam  Nursing note and vitals reviewed. Constitutional: He is oriented to person, place, and time.  Neck: Normal range of motion.  Respiratory: Effort normal.  Musculoskeletal: Normal range of motion.  Neurological: He is alert and oriented to person, place, and time.    Review of Systems  Psychiatric/Behavioral: Positive for depression, suicidal ideas and substance abuse. The patient is nervous/anxious.   All other systems reviewed and are negative.   Blood pressure 162/97, pulse 104, temperature 98.6 F (37 C), temperature source Oral, resp. rate 20, SpO2 99 %.There is no weight on file to calculate BMI.  General Appearance: Disheveled  Eye Contact::  Good  Speech:  Clear and Coherent  Volume:  Normal  Mood:  Depressed  Affect:  Depressed and Flat  Thought Process:  Circumstantial and Goal Directed  Orientation:  Full (Time, Place, and Person)  Thought Content:  "I need help; I'm just tired"  Suicidal Thoughts:  Yes.  with intent/plan  Homicidal Thoughts:  No  Memory:  Immediate;   Good Recent;   Good Remote;   Good  Judgement:  Fair  Insight:  Fair  Psychomotor Activity:  Tremor  Concentration:  Fair  Recall:  Good  Fund of Knowledge:Good  Language: Good  Akathisia:  No  Handed:  Right  AIMS (if indicated):     Assets:  Communication Skills Desire for Improvement  ADL's:  Intact  Cognition: WNL  Sleep:      Medical Decision Making: Review of Psycho-Social Stressors (1), Review or order clinical lab tests (1), Review and summation of old records (2), Review of Medication Regimen & Side Effects (2) and Review of New Medication or Change in Dosage (2)  Treatment Plan Summary: Daily contact with patient to  assess and evaluate symptoms and progress in treatment and Medication management  Plan:  Recommend psychiatric Inpatient admission when medically cleared.   Disposition:   Recommended inpatient treatment for alcohol detox and depression  Rankin, Shuvon, FNP-BC 02/12/2015 3:29 PM Patient seen face-to-face for psychiatric evaluation, chart reviewed and case discussed with the physician extender and developed treatment plan. Reviewed the information documented and agree with the treatment plan. MojCorena PilgrimD

## 2015-02-12 NOTE — ED Notes (Signed)
Pt resting sitter at the bedside will continue to monitor. 

## 2015-02-13 ENCOUNTER — Encounter (HOSPITAL_COMMUNITY): Payer: Self-pay | Admitting: *Deleted

## 2015-02-13 ENCOUNTER — Inpatient Hospital Stay (HOSPITAL_COMMUNITY)
Admission: AD | Admit: 2015-02-13 | Discharge: 2015-02-16 | DRG: 897 | Disposition: A | Discharge status: Home / Self Care | Payer: Federal, State, Local not specified - Other | Source: Intra-hospital | Attending: Psychiatry | Admitting: Psychiatry

## 2015-02-13 DIAGNOSIS — F1020 Alcohol dependence, uncomplicated: Principal | ICD-10-CM | POA: Diagnosis present

## 2015-02-13 DIAGNOSIS — Z811 Family history of alcohol abuse and dependence: Secondary | ICD-10-CM

## 2015-02-13 DIAGNOSIS — F17210 Nicotine dependence, cigarettes, uncomplicated: Secondary | ICD-10-CM | POA: Diagnosis present

## 2015-02-13 DIAGNOSIS — F319 Bipolar disorder, unspecified: Secondary | ICD-10-CM | POA: Diagnosis present

## 2015-02-13 DIAGNOSIS — G4700 Insomnia, unspecified: Secondary | ICD-10-CM | POA: Diagnosis present

## 2015-02-13 DIAGNOSIS — F419 Anxiety disorder, unspecified: Secondary | ICD-10-CM | POA: Diagnosis present

## 2015-02-13 DIAGNOSIS — Z8249 Family history of ischemic heart disease and other diseases of the circulatory system: Secondary | ICD-10-CM

## 2015-02-13 DIAGNOSIS — Y908 Blood alcohol level of 240 mg/100 ml or more: Secondary | ICD-10-CM | POA: Diagnosis present

## 2015-02-13 DIAGNOSIS — R45851 Suicidal ideations: Secondary | ICD-10-CM | POA: Diagnosis not present

## 2015-02-13 DIAGNOSIS — F10230 Alcohol dependence with withdrawal, uncomplicated: Secondary | ICD-10-CM | POA: Diagnosis not present

## 2015-02-13 MED ORDER — LOPERAMIDE HCL 2 MG PO CAPS: 2.0000 mg | ORAL_CAPSULE | ORAL | Status: AC | PRN

## 2015-02-13 MED ORDER — LORAZEPAM 1 MG PO TABS
1.0000 mg | ORAL_TABLET | Freq: Every day | ORAL | Status: AC
  Administered 2015-02-16: 1 mg via ORAL
  Filled 2015-02-13: qty 1

## 2015-02-13 MED ORDER — THIAMINE HCL 100 MG/ML IJ SOLN: 100.0000 mg | Freq: Every day | INTRAMUSCULAR | Status: DC

## 2015-02-13 MED ORDER — FLUOXETINE HCL 20 MG PO CAPS
20.0000 mg | ORAL_CAPSULE | Freq: Every day | ORAL | Status: DC
  Administered 2015-02-14 – 2015-02-16 (×3): 20 mg via ORAL
  Filled 2015-02-13 (×4): qty 1

## 2015-02-13 MED ORDER — ONDANSETRON 4 MG PO TBDP: 4.0000 mg | ORAL_TABLET | Freq: Three times a day (TID) | ORAL | Status: DC | PRN

## 2015-02-13 MED ORDER — THIAMINE HCL 100 MG/ML IJ SOLN
100.0000 mg | Freq: Once | INTRAMUSCULAR | Status: AC
  Administered 2015-02-13: 100 mg via INTRAMUSCULAR

## 2015-02-13 MED ORDER — ALUM & MAG HYDROXIDE-SIMETH 200-200-20 MG/5ML PO SUSP: 30.0000 mL | ORAL | Status: DC | PRN

## 2015-02-13 MED ORDER — TRAZODONE HCL 150 MG PO TABS
150.0000 mg | ORAL_TABLET | Freq: Every day | ORAL | Status: DC
  Administered 2015-02-13 – 2015-02-15 (×3): 150 mg via ORAL
  Filled 2015-02-13 (×7): qty 1

## 2015-02-13 MED ORDER — VITAMIN B-1 100 MG PO TABS: 100.0000 mg | ORAL_TABLET | Freq: Every day | ORAL | Status: DC

## 2015-02-13 MED ORDER — GABAPENTIN 300 MG PO CAPS
300.0000 mg | ORAL_CAPSULE | Freq: Three times a day (TID) | ORAL | Status: DC
  Administered 2015-02-13 – 2015-02-16 (×8): 300 mg via ORAL
  Filled 2015-02-13 (×14): qty 1

## 2015-02-13 MED ORDER — HYDROXYZINE HCL 25 MG PO TABS
25.0000 mg | ORAL_TABLET | Freq: Four times a day (QID) | ORAL | Status: AC | PRN
Start: 2015-02-13 — End: 2015-02-16
  Filled 2015-02-13: qty 1

## 2015-02-13 MED ORDER — VITAMIN B-1 100 MG PO TABS
100.0000 mg | ORAL_TABLET | Freq: Every day | ORAL | Status: DC
  Administered 2015-02-14 – 2015-02-16 (×3): 100 mg via ORAL
  Filled 2015-02-13 (×4): qty 1

## 2015-02-13 MED ORDER — ONDANSETRON 4 MG PO TBDP
4.0000 mg | ORAL_TABLET | Freq: Four times a day (QID) | ORAL | Status: AC | PRN
  Administered 2015-02-13: 4 mg via ORAL
  Filled 2015-02-13: qty 1

## 2015-02-13 MED ORDER — NICOTINE 14 MG/24HR TD PT24
14.0000 mg | MEDICATED_PATCH | Freq: Every day | TRANSDERMAL | Status: DC
  Administered 2015-02-14 – 2015-02-15 (×2): 14 mg via TRANSDERMAL
  Filled 2015-02-13 (×4): qty 1

## 2015-02-13 MED ORDER — LORAZEPAM 1 MG PO TABS: 0.0000 mg | ORAL_TABLET | Freq: Two times a day (BID) | ORAL | Status: DC

## 2015-02-13 MED ORDER — LORAZEPAM 1 MG PO TABS
1.0000 mg | ORAL_TABLET | Freq: Three times a day (TID) | ORAL | Status: AC
  Administered 2015-02-14 (×2): 1 mg via ORAL
  Filled 2015-02-13 (×2): qty 1

## 2015-02-13 MED ORDER — LORAZEPAM 1 MG PO TABS
1.0000 mg | ORAL_TABLET | Freq: Two times a day (BID) | ORAL | Status: AC
  Administered 2015-02-15 (×2): 1 mg via ORAL
  Filled 2015-02-13 (×2): qty 1

## 2015-02-13 MED ORDER — OLANZAPINE 10 MG PO TABS
10.0000 mg | ORAL_TABLET | Freq: Every day | ORAL | Status: DC
  Administered 2015-02-13: 10 mg via ORAL
  Filled 2015-02-13 (×4): qty 1

## 2015-02-13 MED ORDER — LORAZEPAM 1 MG PO TABS: 0.0000 mg | ORAL_TABLET | Freq: Four times a day (QID) | ORAL | Status: DC

## 2015-02-13 MED ORDER — LORAZEPAM 1 MG PO TABS: 1.0000 mg | ORAL_TABLET | Freq: Four times a day (QID) | ORAL | Status: AC | PRN

## 2015-02-13 MED ORDER — MAGNESIUM HYDROXIDE 400 MG/5ML PO SUSP
30.0000 mL | Freq: Every day | ORAL | Status: DC | PRN
Start: 2015-02-13 — End: 2015-02-16

## 2015-02-13 MED ORDER — ADULT MULTIVITAMIN W/MINERALS CH
1.0000 | ORAL_TABLET | Freq: Every day | ORAL | Status: DC
  Administered 2015-02-13 – 2015-02-16 (×4): 1 via ORAL
  Filled 2015-02-13 (×5): qty 1

## 2015-02-13 MED ORDER — ACETAMINOPHEN 325 MG PO TABS
650.0000 mg | ORAL_TABLET | Freq: Four times a day (QID) | ORAL | Status: DC | PRN
Start: 2015-02-13 — End: 2015-02-16

## 2015-02-13 MED ORDER — LORAZEPAM 1 MG PO TABS
1.0000 mg | ORAL_TABLET | Freq: Four times a day (QID) | ORAL | Status: AC
  Administered 2015-02-13 (×2): 1 mg via ORAL
  Filled 2015-02-13 (×2): qty 1

## 2015-02-13 NOTE — [facility] Group Notes (Signed)
Adult Psychoeducational Group Note  Date:  02/13/2015 Time:  8:00PM Group Topic/Focus:  Wrap-Up Group:   The focus of this group is to help patients review their daily goal of treatment and discuss progress on daily workbooks.  Participation Level:  Did Not Attend   Additional Comments:  Pt didn't attend group.   Bing Plume D 02/13/2015, 10:27 PM

## 2015-02-13 NOTE — Progress Notes (Signed)
Psychoeducational Group Note  Date: 02/13/2015 Time: 1015  Group Topic/Focus:  Identifying Needs:   The focus of this group is to help patients identify their personal needs that have been historically problematic and identify healthy behaviors to address their needs.  Participation Level:  Did Not Attend  Participation Quality:  N/A  Affect:  N/A  Cognitive:  N/A  Insight:  N/A  Engagement in Group:    Additional Comments:    8/20/20164:45 PM Janina Trafton, Joie Bimler

## 2015-02-13 NOTE — ED Notes (Addendum)
Pt appears dishelved this am. He stated he slept good and is glad to be admitted to [facility]. He stated he has passive SI but presently does not want to hurt himself. He does contract for safety. Pt requested ativan and was given  by the night shift nurse at 7:20am. ( ) 8:10am-Phoned [facility] to give report. Nurse will phone back. 8:15am-Report given to [facility]. Pt will be admitted to room 302-2 at [facility]. 9:50am _awaiting orders on pt. 11:50amPhoned [facility] and pt will be transported across the street.

## 2015-02-13 NOTE — Progress Notes (Signed)
Patient ID: Jordan Davidson, male   DOB: 1970/07/01, 44 y.o.   MRN: [id number]   44 year old white male admitted after he presented to [facility] requested detox from alcohol. Pt also reported that he was positive SI, with plan to jump in front of city bus. Pt's BAL was 308, and he reported seizures with detox. Pt reported once at [facility] that he was negative SI, and that he wants to live. Pt reported that he was living at a recovery house and was kicked out due to him drinking again. Pt reported that he became hopeless and at the moment he wanted to jump in front of a city bus. Pt reported at time of admission that he was negative SI/HI, no AH/VH noted. Pt reported that he had been drinking 2 fifths of liquor daily for the last 30 years. Pt reported that his depression was a 6, and that his hopelessness was a 3.

## 2015-02-13 NOTE — Tx Team (Signed)
Initial Interdisciplinary Treatment Plan   PATIENT STRESSORS: Substance abuse   PATIENT STRENGTHS: Ability for insight General fund of knowledge   PROBLEM LIST: Problem List/Patient Goals Date to be addressed Date deferred Reason deferred Estimated date of resolution  Substance Abuse 02/13/15                                                      DISCHARGE CRITERIA:  Improved stabilization in mood, thinking, and/or behavior Need for constant or close observation no longer present  PRELIMINARY DISCHARGE PLAN: Return to previous living arrangement  PATIENT/FAMIILY INVOLVEMENT: This treatment plan has been presented to and reviewed with the patient, Jordan Davidson, and/or family member.  The patient and family have been given the opportunity to ask questions and make suggestions.  [name] 02/13/2015, 12:55 PM

## 2015-02-14 ENCOUNTER — Encounter (HOSPITAL_COMMUNITY): Payer: Self-pay | Admitting: Psychiatry

## 2015-02-14 DIAGNOSIS — R45851 Suicidal ideations: Secondary | ICD-10-CM

## 2015-02-14 DIAGNOSIS — F10230 Alcohol dependence with withdrawal, uncomplicated: Secondary | ICD-10-CM

## 2015-02-14 DIAGNOSIS — F319 Bipolar disorder, unspecified: Secondary | ICD-10-CM | POA: Diagnosis present

## 2015-02-14 MED ORDER — HYDROXYZINE HCL 50 MG PO TABS
50.0000 mg | ORAL_TABLET | Freq: Every evening | ORAL | Status: DC | PRN
Start: 2015-02-14 — End: 2015-02-16
  Administered 2015-02-14 – 2015-02-15 (×2): 50 mg via ORAL
  Filled 2015-02-14: qty 14
  Filled 2015-02-14 (×2): qty 1

## 2015-02-14 MED ORDER — OLANZAPINE 7.5 MG PO TABS
15.0000 mg | ORAL_TABLET | Freq: Every day | ORAL | Status: DC
  Administered 2015-02-14: 15 mg via ORAL
  Filled 2015-02-14 (×4): qty 2

## 2015-02-14 MED ORDER — HYDROCERIN EX CREA
TOPICAL_CREAM | Freq: Two times a day (BID) | CUTANEOUS | Status: DC
  Administered 2015-02-15 – 2015-02-16 (×3): via TOPICAL
  Filled 2015-02-14: qty 113
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
## 2015-02-14 NOTE — Progress Notes (Signed)
Patient did attend the evening speaker AA meeting.  

## 2015-02-14 NOTE — Progress Notes (Signed)
Jordan Davidson remains quiet, isolative and keeps to himself in his bed in his room. He takes his meds as scheduled and he writes he has no SI today and he rates his depression, hopelessness and anxiety " 4/4/7", respectively.   A He attends his afternoon group and is attentive during the group discussion on ways to develop healthier coping skills.    R He says he is hoping for DC home tomorrow and he denies withdrawal symptoms.

## 2015-02-14 NOTE — [facility] Suicide Risk Assessment (Signed)
[facility] INPATIENT:  Family/Significant Other Suicide Prevention Education  Suicide Prevention Education:  Patient Refusal for Family/Significant Other Suicide Prevention Education: The patient Jordan Davidson has refused to provide written consent for family/significant other to be provided Family/Significant Other Suicide Prevention Education during admission and/or prior to discharge.  Physician notified.  Writer provided suicide prevention education directly to patient; conversation included risk factors, warning signs and resources to contact for help. Mobile crisis services explained and  explanations given as to resources which will be included on patient's discharge paperwork.   Clide Dales 02/14/2015, 3:33 PM

## 2015-02-14 NOTE — [facility] Suicide Risk Assessment (Signed)
[facility] Admission Suicide Risk Assessment   Nursing information obtained from:  Patient Demographic factors:  Male, Caucasian Current Mental Status:  NA Loss Factors:  Financial problems / change in socioeconomic status Historical Factors:  NA Risk Reduction Factors:  Religious beliefs about death Total Time spent with patient: 20 minutes Principal Problem: Alcohol use disorder, severe, dependence Diagnosis:   Patient Active Problem List   Diagnosis Date Noted  . Bipolar I disorder, most recent episode (or current) unspecified [F31.9] 02/14/2015  . Alcohol use disorder, severe, dependence [F10.20] 02/13/2015     Continued Clinical Symptoms:  Alcohol Use Disorder Identification Test Final Score (AUDIT): 20 The "Alcohol Use Disorders Identification Test", Guidelines for Use in Primary Care, Second Edition.  World Science writer [facility]). Score between 0-7:  no or low risk or alcohol related problems. Score between 8-15:  moderate risk of alcohol related problems. Score between 16-19:  high risk of alcohol related problems. Score 20 or above:  warrants further diagnostic evaluation for alcohol dependence and treatment.   CLINICAL FACTORS:   Bipolar Disorder:   Mixed State Alcohol/Substance Abuse/Dependencies   Musculoskeletal: Strength & Muscle Tone: within normal limits Gait & Station: normal Patient leans: N/A  Psychiatric Specialty Exam: Physical Exam  Review of Systems  Respiratory: Positive for cough.   Psychiatric/Behavioral: Positive for depression, suicidal ideas, hallucinations and substance abuse.  All other systems reviewed and are negative.   Blood pressure 107/61, pulse 80, temperature 98.1 F (36.7 C), temperature source Oral, resp. rate 16, height 6' (1.829 m), weight 81.647 kg (180 lb).Body mass index is 24.41 kg/(m^2).  General Appearance: Guarded  Eye Contact::  Fair  Speech:  Clear and Coherent  Volume:  Decreased  Mood:  Anxious and Depressed   Affect:  Congruent  Thought Process:  Coherent  Orientation:  Full (Time, Place, and Person)  Thought Content:  Hallucinations: Auditory Visual feels things are unreal and he can come back right out of it, Paranoid Ideation and Rumination  Suicidal Thoughts:  Yes.  with intent/plan  Homicidal Thoughts:  No  Memory:  Immediate;   Fair Recent;   Fair Remote;   Fair  Judgement:  Impaired  Insight:  Fair  Psychomotor Activity:  Normal  Concentration:  Fair  Recall:  [employer] of Knowledge:Fair  Language: Fair  Akathisia:  No  Handed:  Right  AIMS (if indicated):     Assets:  Communication Skills  Sleep:     Cognition: WNL  ADL's:  Intact     COGNITIVE FEATURES THAT CONTRIBUTE TO RISK:  Closed-mindedness, Polarized thinking and Thought constriction (tunnel vision)    SUICIDE RISK:   Moderate:  Frequent suicidal ideation with limited intensity, and duration, some specificity in terms of plans, no associated intent, good self-control, limited dysphoria/symptomatology, some risk factors present, and identifiable protective factors, including available and accessible social support.  PLAN OF CARE: Patient will benefit from inpatient treatment and stabilization.  Estimated length of stay is 5-7 days.  Reviewed past medical records,treatment plan.  Patient at this time states that he was diagnosed with Bipolar and the past. However reviewing hx - unclear if this is a primary psychopathology or 2/2 alcohol use disorder. Pt reports that Prozac, Zyprexa and neurontin works for him well and does not want to be on any other mood stabilizer. It was explained to him that if he does have Bipolar do , Prozac by itself may not be the right choice . However he wants to continue all his  medications as it is for now. Will continue home medications . Could re-evaluate during his stay here. Start CIWA/Ativan protocol for alcohol withdrawal sx.  Will continue to monitor vitals ,medication compliance  and treatment side effects while patient is here.  Will monitor for medical issues as well as call consult as needed.  Reviewed labs pt with Hyponatremia , Platelet count low - BAL >300. Could repeat BMP tomorrow ,repeat CBC, PT/INR. Order metabolic panel , PL ,ekg FOR QTC since he is on an antipsychotic.  CSW will start working on disposition.  Patient to participate in therapeutic milieu .       Medical Decision Making:  Review of Psycho-Social Stressors (1), Review or order clinical lab tests (1), Review of Last Therapy Session (1), Review of Medication Regimen & Side Effects (2) and Review of New Medication or Change in Dosage (2)  I certify that inpatient services furnished can reasonably be expected to improve the patient's condition.   Martise Waddell md 02/14/2015, 10:45 AM

## 2015-02-14 NOTE — Progress Notes (Signed)
Psychoeducational Group Note  Date:  02/14/2015 Time: 1315 Group Topic/Focus:  Making Healthy Choices:   The focus of this group is to help patients identify negative/unhealthy choices they were using prior to admission and identify positive/healthier coping strategies to replace them upon discharge.  Participation Level:  Active  Participation Quality:  Appropriate  Affect:  Appropriate  Cognitive:  Appropriate  Insight:  Engaged  Engagement in Group:  Engaged  Additional Comments:    Rich Brave 7:14 PM. 02/14/2015

## 2015-02-14 NOTE — Progress Notes (Signed)
D: Pt in bed resting. Pt c/o pained of insomnia. She states, "I had not slept in about 4 days" Pt also complained of moderate depression of 5 on a 0-10 depression scale. Pt also complained of moderate anxiety of 5 on a 0-10 anxiety scale. Pt was observed with moderate tremors. Pt however denies SI, HI and AVH. Pt continues to be pleasant and cooperative during assessment.  A: Pt did not attend group. Medications administered as prescribed.  Support, encouragement, and safe environment provided.  15-minute safety checks continue. R: Pt was med compliant.  Safety checks continue.

## 2015-02-14 NOTE — Progress Notes (Signed)
Psychoeducational Group Note  Date:  02/14/2015 Time: 0930 Group Topic/Focus:  Making Healthy Choices:   The focus of this group is to help patients identify negative/unhealthy choices they were using prior to admission and identify positive/healthier coping strategies to replace them upon discharge.  Participation Level:  Did Not Attend  Participation Quality:  N/A  Affect:    Cognitive:    Insight:    Engagement in Group:    Additional Comments:    Tamya Denardo, Joie Bimler
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
## 2015-02-14 NOTE — [facility] Group Notes (Signed)
[facility] Group Notes: (Clinical Social Work)   02/14/2015      Type of Therapy:  Group Therapy   Participation Level:  Did Not Attend despite MHT prompting   Oona Trammel Grossman-Orr, LCSW 02/14/2015, 4:58 PM     

## 2015-02-15 DIAGNOSIS — F319 Bipolar disorder, unspecified: Secondary | ICD-10-CM

## 2015-02-15 DIAGNOSIS — F1020 Alcohol dependence, uncomplicated: Principal | ICD-10-CM

## 2015-02-15 LAB — LIPID PANEL
CHOLESTEROL: 206 mg/dL — AB (ref 0–200)
HDL: 89 mg/dL (ref >40)
LDL Cholesterol: 96 mg/dL (ref 0–99)
TRIGLYCERIDES: 106 mg/dL (ref <150)
Total CHOL/HDL Ratio: 2.3 RATIO
VLDL: 21 mg/dL (ref 0–40)

## 2015-02-15 LAB — BASIC METABOLIC PANEL
ANION GAP: 9 (ref 5–15)
BUN: 21 mg/dL — ABNORMAL HIGH (ref 6–20)
CALCIUM: 9.6 mg/dL (ref 8.9–10.3)
CO2: 27 mmol/L (ref 22–32)
CREATININE: 0.71 mg/dL (ref 0.61–1.24)
Chloride: 103 mmol/L (ref 101–111)
Glucose, Bld: 82 mg/dL (ref 65–99)
Potassium: 4.3 mmol/L (ref 3.5–5.1)
SODIUM: 139 mmol/L (ref 135–145)

## 2015-02-15 LAB — TSH: TSH: 2.33 u[IU]/mL (ref 0.350–4.500)

## 2015-02-15 LAB — PROTIME-INR
INR: 0.88 (ref 0.00–1.49)
Prothrombin Time: 12.1 seconds (ref 11.6–15.2)

## 2015-02-15 MED ORDER — OLANZAPINE 10 MG PO TABS
10.0000 mg | ORAL_TABLET | Freq: Every day | ORAL | Status: DC
  Administered 2015-02-15: 10 mg via ORAL
  Filled 2015-02-15 (×3): qty 1

## 2015-02-15 MED ORDER — ACAMPROSATE CALCIUM 333 MG PO TBEC
666.0000 mg | DELAYED_RELEASE_TABLET | Freq: Three times a day (TID) | ORAL | Status: DC
  Administered 2015-02-16 (×2): 666 mg via ORAL
  Filled 2015-02-15 (×5): qty 2

## 2015-02-15 NOTE — Progress Notes (Signed)
Recreation Therapy Notes  Date: 08.22.2016 Time: 9:30am Location: [address] Day[address]   Group Topic: Stress Management  Goal Area(s) Addresses:  Patient will actively participate in stress management techniques presented during session.   Behavioral Response: Did not attend.   Nesa Distel L Zoe Creasman, LRT/CTRS  Glorious Flicker L 02/15/2015 1:42 PM 

## 2015-02-15 NOTE — Progress Notes (Signed)
[facility] MD Progress Note  02/15/2015 4:57 PM Jordan Davidson  MRN:  [id number] Subjective:  [name] states he has not been able to maintain abstinence past a year after going to rehab. States once he takes that first drink, it is over. He is endorsing that he is ready to be more active in pursuing long term abstinence. He is going to be given the opportunity of going back to the half way house and to go back to work. 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 He states when he drinks he goes off his medications Principal Problem: Alcohol use disorder, severe, dependence Diagnosis:   Patient Active Problem List   Diagnosis Date Noted  . Bipolar I disorder, most recent episode (or current) unspecified [F31.9] 02/14/2015  . Alcohol use disorder, severe, dependence [F10.20] 02/13/2015   Total Time spent with patient: 30 minutes   Past Medical History:  Past Medical History  Diagnosis Date  . Alcohol abuse   . Depression   . Bipolar 1 disorder, depressed     Past Surgical History  Procedure Laterality Date  . No past surgeries     Family History:  Family History  Problem Relation Age of Onset  . Hypertension Mother   . Cancer Father   . Heart failure Father   . Alcoholism Father    Social History:  History  Alcohol Use  . Yes    Comment: daily - "whatever I can get" - 1/5 plus daily     History  Drug Use No    Social History   Social History  . Marital Status: Single    Spouse Name: N/A  . Number of Children: N/A  . Years of Education: N/A   Social History Main Topics  . Smoking status: Current Every Day Smoker  . Smokeless tobacco: None  . Alcohol Use: Yes     Comment: daily - "whatever I can get" - 1/5 plus daily  . Drug Use: No  . Sexual Activity: No   Other Topics Concern  . None   Social History Narrative   Additional History:    Sleep: Fair  Appetite:  Fair   Assessment:    Musculoskeletal: Strength & Muscle Tone: within normal limits Gait & Station: normal Patient leans: normal   Psychiatric Specialty Exam: Physical Exam  Review of Systems  Constitutional: Negative.   HENT: Negative.   Eyes: Negative.   Respiratory:       Half a pack to a pack  Cardiovascular: Negative.   Gastrointestinal: Negative.   Musculoskeletal: Negative.   Skin: Positive for rash.  Neurological: Negative.   Endo/Heme/Allergies: Negative.   Psychiatric/Behavioral: Positive for depression and substance abuse. The patient is nervous/anxious.     Blood pressure 133/73, pulse 96, temperature 98.2 F (36.8 C), temperature source Oral, resp. rate 16, height 6' (1.829 m), weight 81.647 kg (180 lb).Body mass index is 24.41 kg/(m^2).  General Appearance: Fairly Groomed  Engineer, water::  Fair  Speech:  Clear and Coherent  Volume:  Normal  Mood:  Anxious and worried  Affect:  Appropriate and anxious worried  Thought Process:  Coherent and Goal Directed  Orientation:  Full (Time, Place, and Person)  Thought Content:  symptoms events worries concerns  Suicidal Thoughts:  No  Homicidal Thoughts:  No  Memory:  Immediate;   Fair Recent;   Fair Remote;   Fair  Judgement:  Fair  Insight:  Present  Psychomotor Activity:  Normal  Concentration:  Fair  Recall:  [employer] of Knowledge:Fair  Language: Fair  Akathisia:  No  Handed:  Right  AIMS (if indicated):     Assets:  Desire for Improvement  ADL's:  Intact  Cognition: WNL  Sleep:  Number of Hours: 5.5     Current Medications: Current Facility-Administered Medications  Medication Dose Route Frequency Provider Last Rate Last Dose  . acetaminophen (TYLENOL) tablet 650 mg  650 mg Oral Q6H PRN Delfin Gant, NP      . alum & mag hydroxide-simeth (MAALOX/MYLANTA) 200-200-20 MG/5ML suspension 30 mL  30 mL Oral Q4H PRN Delfin Gant, NP      . FLUoxetine (PROZAC) capsule 20 mg  20 mg Oral Daily Delfin Gant, NP    20 mg at 02/15/15 0825  . gabapentin (NEURONTIN) capsule 300 mg  300 mg Oral TID Delfin Gant, NP   300 mg at 02/15/15 1256  . hydrocerin (EUCERIN) cream   Topical BID Harriet Butte, NP      . hydrOXYzine (ATARAX/VISTARIL) tablet 25 mg  25 mg Oral Q6H PRN Nicholaus Bloom, MD      . hydrOXYzine (ATARAX/VISTARIL) tablet 50 mg  50 mg Oral QHS PRN Kerrie Buffalo, NP   50 mg at 02/14/15 2211  . loperamide (IMODIUM) capsule 2-4 mg  2-4 mg Oral PRN Nicholaus Bloom, MD      . LORazepam (ATIVAN) tablet 1 mg  1 mg Oral Q6H PRN Nicholaus Bloom, MD      . LORazepam (ATIVAN) tablet 1 mg  1 mg Oral BID Nicholaus Bloom, MD   1 mg at 02/15/15 0825   Followed by  . [START ON 02/16/2015] LORazepam (ATIVAN) tablet 1 mg  1 mg Oral Daily Nicholaus Bloom, MD      . magnesium hydroxide (MILK OF MAGNESIA) suspension 30 mL  30 mL Oral Daily PRN Delfin Gant, NP      . multivitamin with minerals tablet 1 tablet  1 tablet Oral Daily Nicholaus Bloom, MD   1 tablet at 02/15/15 0825  . nicotine (NICODERM CQ - dosed in mg/24 hours) patch 14 mg  14 mg Transdermal Daily Delfin Gant, NP   14 mg at 02/15/15 6761  . OLANZapine (ZYPREXA) tablet 15 mg  15 mg Oral QHS Kerrie Buffalo, NP   15 mg at 02/14/15 2211  . ondansetron (ZOFRAN-ODT) disintegrating tablet 4 mg  4 mg Oral Q6H PRN Nicholaus Bloom, MD   4 mg at 02/13/15 1726  . thiamine (VITAMIN B-1) tablet 100 mg  100 mg Oral Daily Nicholaus Bloom, MD   100 mg at 02/15/15 0825  . traZODone (DESYREL) tablet 150 mg  150 mg Oral QHS Delfin Gant, NP   150 mg at 02/14/15 2211    Lab Results:  Results for orders placed or performed during the hospital encounter of 02/13/15 (from the past 48 hour(s))  Basic metabolic panel     Status: Abnormal   Collection Time: 02/15/15  6:20 AM  Result Value Ref Range   Sodium 139 135 - 145 mmol/L   Potassium 4.3 3.5 - 5.1 mmol/L   Chloride 103 101 - 111 mmol/L   CO2 27 22 - 32 mmol/L   Glucose, Bld 82 65 - 99 mg/dL   BUN 21 (H) 6  - 20 mg/dL   Creatinine, Ser 0.71 0.61 -  1.24 mg/dL   Calcium 9.6 8.9 - 10.3 mg/dL   GFR calc non Af Amer >60 >60 mL/min   GFR calc Af Amer >60 >60 mL/min    Comment: (NOTE) The eGFR has been calculated using the CKD EPI equation. This calculation has not been validated in all clinical situations. eGFR's persistently <60 mL/min signify possible Chronic Kidney Disease.    Anion gap 9 5 - 15    Comment: Performed at [facility]  TSH     Status: None   Collection Time: 02/15/15  6:20 AM  Result Value Ref Range   TSH 2.330 0.350 - 4.500 uIU/mL    Comment: Performed at [facility]  Lipid panel     Status: Abnormal   Collection Time: 02/15/15  6:20 AM  Result Value Ref Range   Cholesterol 206 (H) 0 - 200 mg/dL   Triglycerides 106 <150 mg/dL   HDL 89 >40 mg/dL   Total CHOL/HDL Ratio 2.3 RATIO   VLDL 21 0 - 40 mg/dL   LDL Cholesterol 96 0 - 99 mg/dL    Comment:        Total Cholesterol/HDL:CHD Risk Coronary Heart Disease Risk Table                     Men   Women  1/2 Average Risk   3.4   3.3  Average Risk       5.0   4.4  2 X Average Risk   9.6   7.1  3 X Average Risk  23.4   11.0        Use the calculated Patient Ratio above and the CHD Risk Table to determine the patient's CHD Risk.        ATP III CLASSIFICATION (LDL):  <100     mg/dL   Optimal  100-129  mg/dL   Near or Above                    Optimal  130-159  mg/dL   Borderline  160-189  mg/dL   High  >190     mg/dL   Very High Performed at [facility]     Physical Findings: AIMS:  , ,  ,  ,    CIWA:  CIWA-Ar Total: 3 COWS:     Treatment Plan Summary: Daily contact with patient to assess and evaluate symptoms and progress in treatment and Medication management Supportive approach/coping skills Alcohol dependence; will detox with Ativan/work a relapse prevention plan Cravings; will start a trial with Campral 333 mg two TID Mood instability; will continue the  Prozac at 20 mg but will decrease the Zyprexa back to 10 mg HS ( he has not been entirely compliant and has been drinking and admits when he drinks he does not take it) Insomnia; will continue the Trazodone at 150 mg HS Anxiety; will use Vistaril PRN Explore follow up options Medical Decision Making:  Review of Psycho-Social Stressors (1), Review or order clinical lab tests (1) and Review of Medication Regimen & Side Effects (2)     Angela Vazguez A 02/15/2015, 4:57 PM

## 2015-02-15 NOTE — Progress Notes (Signed)
Pt's mood and energy level improved this afternoon. He says he will be discharged tomorrow.

## 2015-02-15 NOTE — Progress Notes (Addendum)
D: Pt denies any active withdrawal symptoms. Pt presents with some anxiety. Pt is hoping to be discharged on 02/16/15. Pt plans to program at the [facility]. Pt also plans on returning back to work. Pt is currently negative for any SI/HI/AVH. Pt is visible and active within the milieu. A: Writer administered scheduled and prn medications to pt, per MD orders. Pt informed that he is to receive his first dose of campral in the morning. Writer verbalized indications of campral to patient. Continued support and availability as needed was extended to this pt. Staff continue to monitor pt with q9min checks.  R: No adverse drug reactions noted. Pt receptive to treatment. Pt remains safe at this time.   Pt was concerned on whether or not he had his phone charger in his locker. This item was not specifically listed on his belongings sheet. Chemical engineer found in pt's locker. Pt informed. Pt was relieved. Marland Kitchen

## 2015-02-15 NOTE — B[facility] Group Notes (Signed)
B[facility] LCSW Group Therapy  02/15/2015 1:13 PM  Type of Therapy:  Group Therapy  Participation Level:  Did Not Attend-pt chose to remain in bed.   Summary of Progress/Problems: Today's Topic: Overcoming Obstacles. Patients identified one short term goal and potential obstacles in reaching this goal. Patients processed barriers involved in overcoming these obstacles. Patients identified steps necessary for overcoming these obstacles and explored motivation (internal and external) for facing these difficulties head on.   Smart, Kerrion Kemppainen LCSWA 02/15/2015, 1:13 PM

## 2015-02-15 NOTE — Progress Notes (Signed)
D: Pt presents anxious in affect and mood. Pt reports having coldsweats and tremors in regards to withdrawal symptoms. Pt's CIWA rated at a 3.
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
## 2015-02-15 NOTE — BHH Group Notes (Signed)
[facility] LCSW Aftercare Discharge Planning Group Note   02/15/2015 10:10 AM  Participation Quality:  DID NOT ATTEND. Pt chose to remain in bed.   Smart, [employer]

## 2015-02-15 NOTE — Progress Notes (Signed)
D: Jordan Davidson denied SI/HI/AVH today. He has been isolative for much of the day, remaining in bed during groups. He rated depression 3, hopelessness 2, and anxiety 7. CIWA was 3 at noon. Pt cooperative with staff, though sometimes guarded. A: Meds given as ordered. Q15 safety checks maintained. Support/encouragement offered. R: Pt remains free from harm and continues with treatment. Will continue to monitor for needs/safety.

## 2015-02-16 LAB — HEMOGLOBIN A1C
Hgb A1c MFr Bld: 5.7 % — ABNORMAL HIGH (ref 4.8–5.6)
Mean Plasma Glucose: 117 mg/dL

## 2015-02-16 LAB — PROLACTIN: PROLACTIN: 77.2 ng/mL — AB (ref 4.0–15.2)

## 2015-02-16 MED ORDER — HYDROXYZINE HCL 50 MG PO TABS: 50.0000 mg | ORAL_TABLET | Freq: Every evening | ORAL | Status: DC | PRN

## 2015-02-16 MED ORDER — OLANZAPINE 10 MG PO TABS: 10.0000 mg | ORAL_TABLET | Freq: Every day | ORAL | Status: DC

## 2015-02-16 MED ORDER — TRAZODONE HCL 150 MG PO TABS: 150.0000 mg | ORAL_TABLET | Freq: Every day | ORAL | Status: DC

## 2015-02-16 MED ORDER — HYDROCERIN EX CREA: 1.0000 "application " | TOPICAL_CREAM | Freq: Two times a day (BID) | CUTANEOUS | Status: DC

## 2015-02-16 MED ORDER — ACAMPROSATE CALCIUM 333 MG PO TBEC: 666.0000 mg | DELAYED_RELEASE_TABLET | Freq: Three times a day (TID) | ORAL | Status: DC

## 2015-02-16 MED ORDER — GABAPENTIN 300 MG PO CAPS: 300.0000 mg | ORAL_CAPSULE | Freq: Three times a day (TID) | ORAL | Status: DC

## 2015-02-16 MED ORDER — NICOTINE 14 MG/24HR TD PT24: 14.0000 mg | MEDICATED_PATCH | Freq: Every day | TRANSDERMAL | Status: DC

## 2015-02-16 MED ORDER — FLUOXETINE HCL 20 MG PO CAPS: 20.0000 mg | ORAL_CAPSULE | Freq: Every day | ORAL | Status: DC

## 2015-02-16 NOTE — Progress Notes (Signed)
  [facility] Adult Case Management Discharge Plan :  Will you be returning to the same living situation after discharge:  No-pt returning to [facility].  At discharge, do you have transportation home?: Yes,  friend Do you have the ability to pay for your medications: Yes,  mental health  Release of information consent forms completed and submitted to medical records by CSW.  Patient to Follow up at: Follow-up Information    Follow up with [facility].   Why:  Walk in between 8am-9am Monday through Friday for hospital discharge/assessment for services.    Contact information:   [address], [place] [address] Phone: [phone] Fax: [phone]      Patient denies SI/HI: Yes,  during self report.    Safety Planning and Suicide Prevention discussed: Yes,  pt refused to consent to family contact. SPE completed with pt/SPI pamphlet provided.  Have you used any form of tobacco in the last 30 days? (Cigarettes, Smokeless Tobacco, Cigars, and/or Pipes): Patient Refused Screening  Has patient been referred to the Quitline?: Patient refused referral  Jordan, Lebron Davidson  02/16/2015, 12:56 PM

## 2015-02-16 NOTE — Progress Notes (Signed)
Patient ID: Jordan Davidson, male   DOB: 07-03-1970, 44 y.o.   MRN: [id number]  Discharge Note- Seen by Dr Dub Mikes today and decision made for him to be discharged back to his previous placement. He feels ready to go and continue his treatment outpatient at his halfway house. Denies any thoughts to hurt self or others. All property returned to him. Reviewed with him his discharge plans and medications and he verbalized his understanding. Samples given of his meds for approximately 14 days and prescriptions for one month. He was given a bus ticket because the friend he thought would pick him up was unable to. He states he is in a hurry to leave to handle some things related to his children. He expressed his appreciation for the help, escorted to the lobby for discharge.
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
## 2015-02-16 NOTE — Progress Notes (Signed)
Patient ID: Jordan Davidson, male   DOB: 1971-04-17, 44 y.o.   MRN: [id number] D-Completed self inventory and scored self a 1 out of 10 with 10 being the worst. He states he is ready for discharge and his plan on discharge his to go to a recovery house. Waiting to speak with social worker and Dr. Fonnie Birkenhead this plan. A-Support offered monitored for safety and mediations as ordered. R-No complaints at this time. He denies any thoughts to hurt self or others. He was upset on admission for getting kicked out of his recovery house for using alcohol and couldn't put it down, passed out and before passing out tried to kill self.

## 2015-02-16 NOTE — B[facility] Group Notes (Signed)
[facility] Group Notes:  (Nursing/MHT/Case Management/Adjunct)  Date:  02/16/2015  Time:  10:36 AM  Type of Therapy:  Nurse Education  Participation Level:  None  Participation Quality:  Supportive  Affect:  Appropriate and Flat  Cognitive:  Alert  Insight:  Improving  Engagement in Group:  Lacking  Modes of Intervention:  Discussion and Education  Summary of Progress/Problems: Theme of group today was on recovery. He didn't share but was engaged. Two other peers dominated todays group.  Wynona Luna 02/16/2015, 10:36 AM
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
## 2015-02-16 NOTE — Progress Notes (Signed)
Recreation Therapy Notes Animal-Assisted Activity (AAA) Program Checklist/Progress Notes Patient Eligibility Criteria Checklist & Daily Group note for Rec Tx Intervention  Date: 08.23.2016 Time: 2:45pm Location: [address]    AAA/T Program Assumption of Risk Form signed by Patient/ or Parent Legal Guardian yes  Patient is free of allergies or sever asthma yes  Patient reports no fear of animals yes  Patient reports no history of cruelty to animals yes  Patient understands his/her participation is voluntary yes  Patient washes hands before animal contact yes  Patient washes hands after animal contact yes  Behavioral Response: Did not attend.   Marykay Lex Chance Karam, LRT/CTRS   Taeveon Keesling L 02/16/2015 3:02 PM

## 2015-02-16 NOTE — BHH Suicide Risk Assessment (Signed)
[facility] Discharge Suicide Risk Assessment   Demographic Factors:  Male and Caucasian  Total Time spent with patient: 30 minutes  Musculoskeletal: Strength & Muscle Tone: within normal limits Gait & Station: normal Patient leans: normal  Psychiatric Specialty Exam: Physical Exam  Review of Systems  Constitutional: Negative.   HENT: Negative.   Eyes: Negative.   Respiratory: Negative.   Cardiovascular: Negative.   Gastrointestinal: Negative.   Genitourinary: Negative.   Musculoskeletal: Negative.   Skin: Negative.   Neurological: Negative.   Endo/Heme/Allergies: Negative.   Psychiatric/Behavioral: Positive for substance abuse.    Blood pressure 125/82, pulse 78, temperature 98 F (36.7 C), temperature source Oral, resp. rate 16, height 6' (1.829 m), weight 81.647 kg (180 lb), SpO2 98 %.Body mass index is 24.41 kg/(m^2).  General Appearance: Fairly Groomed  Patent attorney::  Fair  Speech:  Clear and Coherent409  Volume:  Normal  Mood:  Euthymic  Affect:  Appropriate  Thought Process:  Coherent and Goal Directed  Orientation:  Full (Time, Place, and Person)  Thought Content:  plans as he moves on, relapse prevention plan  Suicidal Thoughts:  No  Homicidal Thoughts:  No  Memory:  Immediate;   Fair Recent;   Fair Remote;   Fair  Judgement:  Fair  Insight:  Present  Psychomotor Activity:  Normal  Concentration:  Fair  Recall:  [employer] of Knowledge:Fair  Language: Fair  Akathisia:  No  Handed:  Right  AIMS (if indicated):     Assets:  Desire for Improvement Housing Social Support  Sleep:  Number of Hours: 5.5  Cognition: WNL  ADL's:  Intact   Have you used any form of tobacco in the last 30 days? (Cigarettes, Smokeless Tobacco, Cigars, and/or Pipes): Patient Refused Screening  Has this patient used any form of tobacco in the last 30 days? (Cigarettes, Smokeless Tobacco, Cigars, and/or Pipes) Yes, A prescription for an FDA-approved tobacco cessation medication was  offered at discharge and the patient refused  Mental Status Per Nursing Assessment::   On Admission:  NA  Current Mental Status by Physician: In full contact with reality. There are no active SI plans or intent. There are no active S/S of withdrawal. States he is ready to go to the half way house on work on long term abstinence. States he has good support trough AA/T [facility] and his employer is willing to get him back working. He states that as long as he stays busy, takes his medications he should be OK   Loss Factors: NA  Historical Factors: NA  Risk Reduction Factors:   Employed, Living with another person, especially a relative and Positive social support  Continued Clinical Symptoms:  Bipolar Disorder:   Depressive phase Alcohol/Substance Abuse/Dependencies  Cognitive Features That Contribute To Risk:  Closed-mindedness, Polarized thinking and Thought constriction (tunnel vision)    Suicide Risk:  Minimal: No identifiable suicidal ideation.  Patients presenting with no risk factors but with morbid ruminations; may be classified as minimal risk based on the severity of the depressive symptoms  Principal Problem: Alcohol use disorder, severe, dependence Discharge Diagnoses:  Patient Active Problem List   Diagnosis Date Noted  . Bipolar I disorder, most recent episode (or current) unspecified [F31.9] 02/14/2015  . Alcohol use disorder, severe, dependence [F10.20] 02/13/2015      Plan Of Care/Follow-up recommendations:  Activity:  as tolerated Diet:  regular Follow up [facility] Is patient on multiple antipsychotic therapies at discharge:  No   Has Patient had three  or more failed trials of antipsychotic monotherapy by history:  No  Recommended Plan for Multiple Antipsychotic Therapies: NA    Sundae Maners A 02/16/2015, 12:48 PM

## 2015-03-03 ENCOUNTER — Encounter (HOSPITAL_COMMUNITY): Payer: Self-pay | Admitting: *Deleted

## 2015-03-03 ENCOUNTER — Emergency Department (HOSPITAL_COMMUNITY)
Admission: EM | Admit: 2015-03-03 | Discharge: 2015-03-03 | Disposition: A | Discharge status: Home / Self Care | Payer: Self-pay | Attending: Emergency Medicine | Admitting: Emergency Medicine

## 2015-03-03 DIAGNOSIS — Z720 Tobacco use: Secondary | ICD-10-CM | POA: Insufficient documentation

## 2015-03-03 DIAGNOSIS — Z79899 Other long term (current) drug therapy: Secondary | ICD-10-CM | POA: Insufficient documentation

## 2015-03-03 DIAGNOSIS — F319 Bipolar disorder, unspecified: Secondary | ICD-10-CM | POA: Insufficient documentation

## 2015-03-03 DIAGNOSIS — F1020 Alcohol dependence, uncomplicated: Secondary | ICD-10-CM | POA: Insufficient documentation

## 2015-03-03 NOTE — ED Notes (Signed)
Pt transported from behind [employer] on [facility] by EMS, pt called 911 to be transported for etoh and SI. Pt states he consumed 3-4 40's since 0200.

## 2015-03-03 NOTE — ED Notes (Signed)
Security and [employer] sent to room to escort pt out.

## 2015-03-03 NOTE — ED Provider Notes (Signed)
CSN: [id number]     Arrival date & time 03/03/15  2011 History  This chart was scribed for non-physician practitioner, Elpidio Anis, PA-C, working with Arby Barrette, MD, by Budd Palmer ED Scribe. This patient was seen in room WTR4/WLPT4 and the patient's care was started at 9:00 PM    Chief Complaint  Patient presents with  . Alcohol Intoxication   The history is provided by the patient. No language interpreter was used.   HPI Comments: Jordan Davidson is a 44 y.o. male who presents to the Emergency Department complaining of alcohol intoxication onset earlier today. He notes associated SI. Brought in by [facility] with history of drinking 3-4 40 oz beers today. He reports to [facility] he has not taken his medications "in a while". He states he has been drunk all day. He states he "feels like dying" and has no plans for hurting himself only states he wants to die. He denies abdominal pain.   Past Medical History  Diagnosis Date  . Alcohol abuse   . Depression   . Bipolar 1 disorder, depressed    Past Surgical History  Procedure Laterality Date  . No past surgeries     Family History  Problem Relation Age of Onset  . Hypertension Mother   . Cancer Father   . Heart failure Father   . Alcoholism Father    Social History  Substance Use Topics  . Smoking status: Current Every Day Smoker  . Smokeless tobacco: Not on file  . Alcohol Use: Yes     Comment: daily - "whatever I can get" - 1/5 plus daily    Review of Systems  Constitutional: Negative for fever.  Respiratory: Negative for shortness of breath.   Gastrointestinal: Negative for vomiting and abdominal pain.  Psychiatric/Behavioral: Positive for suicidal ideas.    Allergies  Review of patient's allergies indicates no known allergies.  Home Medications   Prior to Admission medications   Medication Sig Start Date End Date Taking? Authorizing Provider  acamprosate (CAMPRAL) 333 MG tablet Take 2 tablets (666 mg total) by mouth 3  (three) times daily with meals. For alcoholism, 02/16/15   Sanjuana Kava, NP  FLUoxetine (PROZAC) 20 MG capsule Take 1 capsule (20 mg total) by mouth daily. For depression 02/16/15   Sanjuana Kava, NP  gabapentin (NEURONTIN) 300 MG capsule Take 1 capsule (300 mg total) by mouth 3 (three) times daily. For agitation 02/16/15   Sanjuana Kava, NP  hydrocerin (EUCERIN) CREA Apply 1 application topically 2 (two) times daily. For dry skin 02/16/15   Sanjuana Kava, NP  hydrOXYzine (ATARAX/VISTARIL) 50 MG tablet Take 1 tablet (50 mg total) by mouth at bedtime as needed for anxiety (insomnia). 02/16/15   Sanjuana Kava, NP  nicotine (NICODERM CQ - DOSED IN MG/24 HOURS) 14 mg/24hr patch Place 1 patch (14 mg total) onto the skin daily. For smoking cessation 02/16/15   Sanjuana Kava, NP  OLANZapine (ZYPREXA) 10 MG tablet Take 1 tablet (10 mg total) by mouth at bedtime. For mood control 02/16/15   Sanjuana Kava, NP  traZODone (DESYREL) 150 MG tablet Take 1 tablet (150 mg total) by mouth at bedtime. 02/16/15   Sanjuana Kava, NP   There were no vitals taken for this visit. Physical Exam  Constitutional: He is oriented to person, place, and time. He appears well-developed and well-nourished. No distress.  He appears disheveled but ambulatory, coherent, oriented, without slurred speech.  HENT:  Head: Normocephalic  and atraumatic.  Eyes: Conjunctivae are normal. Right eye exhibits no discharge. Left eye exhibits no discharge.  Pulmonary/Chest: Effort normal. No respiratory distress.  Neurological: He is alert and oriented to person, place, and time.  Skin: Skin is warm and dry. No rash noted. He is not diaphoretic. No erythema.  Psychiatric: He has a normal mood and affect.  Nursing note and vitals reviewed.   ED Course  Procedures  DIAGNOSTIC STUDIES: Oxygen Saturation is 100% on RA, normal by my interpretation.    COORDINATION OF CARE: 9:04 PM - Discussed plans to review pt's chart. Pt advised of plan for  treatment and pt agrees.  Labs Review Labs Reviewed - No data to display  Imaging Review No results found. I have personally reviewed and evaluated these images and lab results as part of my medical decision-making.   EKG Interpretation None      MDM   Final diagnoses:  None    1. Chronic alcoholism  The patient is clinically sober, ambulatory, coherent. VSS. Per care plan, the patient can be discharged with outpatient resources.   I personally performed the services described in this documentation, which was scribed in my presence. The recorded information has been reviewed and is accurate.     Elpidio Anis, PA-C 03/03/15 2114  Arby Barrette, MD 03/04/15 [phone]

## 2015-03-03 NOTE — ED Notes (Signed)
Went to discharge pt, pt asked why is he being discharged when he is suicidal.  Melvenia Beam EDPA notified and states that pt is discharged and can be escorted out of the facility.

## 2015-03-03 NOTE — Discharge Instructions (Signed)
Finding Treatment for Alcohol and Drug Addiction °It can be hard to find the right place to get professional treatment. Here are some important things to consider: °· There are different types of treatment to choose from. °· Some programs are live-in (residential) while others are not (outpatient). Sometimes a combination is offered. °· No single type of program is right for everyone. °· Most treatment programs involve a combination of education, counseling, and a 12-step, spiritually-based approach. °· There are non-spiritually based programs (not 12-step). °· Some treatment programs are government sponsored. They are geared for patients without private insurance. °· Treatment programs can vary in many respects such as: °¨ Cost and types of insurance accepted. °¨ Types of on-site medical services offered. °¨ Length of stay, setting, and size. °¨ Overall philosophy of treatment.  °A person may need specialized treatment or have needs not addressed by all programs. For example, adolescents need treatment appropriate for their age. Other people have secondary disorders that must be managed as well. Secondary conditions can include mental illness, such as depression or diabetes. Often, a period of detoxification from alcohol or drugs is needed. This requires medical supervision and not all programs offer this. °THINGS TO CONSIDER WHEN SELECTING A TREATMENT PROGRAM  °· Is the program certified by the appropriate government agency? Even private programs must be certified and employ certified professionals. °· Does the program accept your insurance? If not, can a payment plan be set up? °· Is the facility clean, organized, and well run? Do they allow you to speak with graduates who can share their treatment experience with you? Can you tour the facility? Can you meet with staff? °· Does the program meet the full range of individual needs? °· Does the treatment program address sexual orientation and physical disabilities?  Do they provide age, gender, and culturally appropriate treatment services? °· Is treatment available in languages other than English? °· Is long-term aftercare support or guidance encouraged and provided? °· Is assessment of an individual's treatment plan ongoing to ensure it meets changing needs? °· Does the program use strategies to encourage reluctant patients to remain in treatment long enough to increase the likelihood of success? °· Does the program offer counseling (individual or group) and other behavioral therapies? °· Does the program offer medicine as part of the treatment regimen, if needed? °· Is there ongoing monitoring of possible relapse? Is there a defined relapse prevention program? Are services or referrals offered to family members to ensure they understand addiction and the recovery process? This would help them support the recovering individual. °· Are 12-step meetings held at the center or is transport available for patients to attend outside meetings? °In countries outside of the [place], see local directories for contact information for services in your area. °Document Released: 05/11/2005 Document Revised: 09/04/2011 Document Reviewed: 11/21/2007 °ExitCare® Patient Information ©2015 [employer]. This information is not intended to replace advice given to you by your health care provider. Make sure you discuss any questions you have with your health care provider. ° °Alcohol Use Disorder °Alcohol use disorder is a mental disorder. It is not a one-time incident of heavy drinking. Alcohol use disorder is the excessive and uncontrollable use of alcohol over time that leads to problems with functioning in one or more areas of daily living. People with this disorder risk harming themselves and others when they drink to excess. Alcohol use disorder also can cause other mental disorders, such as mood and anxiety disorders, and serious physical problems.   People with alcohol use disorder  often misuse other drugs.  °Alcohol use disorder is common and widespread. Some people with this disorder drink alcohol to cope with or escape from negative life events. Others drink to relieve chronic pain or symptoms of mental illness. People with a family history of alcohol use disorder are at higher risk of losing control and using alcohol to excess.  °SYMPTOMS  °Signs and symptoms of alcohol use disorder may include the following:  °· Consumption of alcohol in larger amounts or over a longer period of time than intended. °· Multiple unsuccessful attempts to cut down or control alcohol use.   °· A great deal of time spent obtaining alcohol, using alcohol, or recovering from the effects of alcohol (hangover). °· A strong desire or urge to use alcohol (cravings).   °· Continued use of alcohol despite problems at work, school, or home because of alcohol use.   °· Continued use of alcohol despite problems in relationships because of alcohol use. °· Continued use of alcohol in situations when it is physically hazardous, such as driving a car. °· Continued use of alcohol despite awareness of a physical or psychological problem that is likely related to alcohol use. Physical problems related to alcohol use can involve the brain, heart, liver, stomach, and intestines. Psychological problems related to alcohol use include intoxication, depression, anxiety, psychosis, delirium, and dementia.   °· The need for increased amounts of alcohol to achieve the same desired effect, or a decreased effect from the consumption of the same amount of alcohol (tolerance). °· Withdrawal symptoms upon reducing or stopping alcohol use, or alcohol use to reduce or avoid withdrawal symptoms. Withdrawal symptoms include: °¨ Racing heart. °¨ Hand tremor. °¨ Difficulty sleeping. °¨ Nausea. °¨ Vomiting. °¨ Hallucinations. °¨ Restlessness. °¨ Seizures. °DIAGNOSIS °Alcohol use disorder is diagnosed through an assessment by your health care  provider. Your health care provider may start by asking three or four questions to screen for excessive or problematic alcohol use. To confirm a diagnosis of alcohol use disorder, at least two symptoms must be present within a 12-month period. The severity of alcohol use disorder depends on the number of symptoms: °· Mild--two or three. °· Moderate--four or five. °· Severe--six or more. °Your health care provider may perform a physical exam or use results from lab tests to see if you have physical problems resulting from alcohol use. Your health care provider may refer you to a mental health professional for evaluation. °TREATMENT  °Some people with alcohol use disorder are able to reduce their alcohol use to low-risk levels. Some people with alcohol use disorder need to quit drinking alcohol. When necessary, mental health professionals with specialized training in substance use treatment can help. Your health care provider can help you decide how severe your alcohol use disorder is and what type of treatment you need. The following forms of treatment are available:  °· Detoxification. Detoxification involves the use of prescription medicines to prevent alcohol withdrawal symptoms in the first week after quitting. This is important for people with a history of symptoms of withdrawal and for heavy drinkers who are likely to have withdrawal symptoms. Alcohol withdrawal can be dangerous and, in severe cases, cause death. Detoxification is usually provided in a hospital or in-patient substance use treatment facility. °· Counseling or talk therapy. Talk therapy is provided by substance use treatment counselors. It addresses the reasons people use alcohol and ways to keep them from drinking again. The goals of talk therapy are to help people with alcohol use   disorder find healthy activities and ways to cope with life stress, to identify and avoid triggers for alcohol use, and to handle cravings, which can cause  relapse.  Medicines.Different medicines can help treat alcohol use disorder through the following actions:  Decrease alcohol cravings.  Decrease the positive reward response felt from alcohol use.  Produce an uncomfortable physical reaction when alcohol is used (aversion therapy).  Support groups. Support groups are run by people who have quit drinking. They provide emotional support, advice, and guidance. These forms of treatment are often combined. Some people with alcohol use disorder benefit from intensive combination treatment provided by specialized substance use treatment centers. Both inpatient and outpatient treatment programs are available. Document Released: 07/20/2004 Document Revised: 10/27/2013 Document Reviewed: 09/19/2012 [facility] Patient Information 2015 [place], [place]. This information is not intended to replace advice given to you by your health care provider. Make sure you discuss any questions you have with your health care provider. Substance Abuse Treatment Programs  Intensive Outpatient Programs [facility]     [address]      [place], [place]                   [phone]       [facility] [address] [place] [address] [place], [place] [phone]  [facility]     (Inpatient and outpatient)     [address]           [phone]    [facility] [phone] (Suboxone and Methadone)  [address]      [place], [place] [address]      [phone]       [address] [place], [place] [phone]  [facility] (Outpatient/Inpatient, [facility])    (insurance only) [phone]             [facility] (Groups & Residential) [place], [place] [phone]     [facility]     [address]     [place], [place]      [phone]       [facility] (for caregivers and family) [phone] [address] [place],  [place] [phone]      Residential Treatment Programs [facility]      [address], [place], [place] [address]  [phone]       [facility] [address], [place], [place] [address] [phone]  [facility] [place]        [phone]       [facility] [phone]  [facility] ([facility]             [address]                                         [place], [place]                                                [phone] or [phone]                               [facility] [address] [place], [address] [phone]  [facility]    [address]      [place], [place]     [phone]       [facility] [address] [place], [place] [phone]  [facility]   [address] [place], [place] [address]     [phone]      Admissions: 8am-3pm M-F  [facility] ([facility]) [address] [place], [place] [phone]  [facility]: Residential Program [phone] Days)   [place], [place]      [phone] or [phone]     [facility]: [facility] [place], [place] (Walk in Hours over the weekend or by referral)  [facility] [address] [place], [place], [place] [address] [phone]  Crisis Mobile: [facility]:  [phone] (for crisis response 24 hours a day) [facility] Hotline:      [phone] Outpatient Psychiatry and Counseling  [facility]: Mobile Crisis Management 24 hours:  [phone]  [facility] [employer] sliding scale fee and walk in schedule: M-F 8am-12pm/1pm-3pm [address]  [place], [place] [address] [phone]  [facility] [address] [place], [place] [address] [phone]  [facility] ([facility])- new patient walk-in appointments available Monday - Friday 8am -3pm.          [address] [place], [place] [address] [phone] or crisis line- [phone]  [facility] Services/ Intensive Outpatient Therapy Program [address] [place], [place] [address] [phone]  [facility]                  Crisis Services      [phone] [address]     [place], [place] [address]                 [facility]   [facility] [phone]. [address] [place], [place] [address]   [facility]          [address] [place]  [place], [place] [address]       [phone]  [facility] [address] [place], [place] [address] [phone]  [facility]    [address]    [place], [place] [address]     [phone]       Andee Poles, MD     [address]     [place] [address]     [phone]       [facility] [address] [place] [address]  [facility]     [address] [place], [place]      [phone]       [facility] Eulogio Ditch, MD [address] [place], [place] [address] [phone]  [facility]     [address]     [place], [place] [address]     [phone]       [facility] [address] [place], [place] [address] [phone] Resources for Temporary Residential Assistance/Crisis Centers  [facility]) M-F 8am-3pm   [address] [place], [place] [address]   [phone] Services include: laundry, barbering, support groups, case management, phone  & computer access, showers, AA/NA mtgs, mental health/substance abuse nurse, job skills class,  disability information, VA assistance, spiritual classes, etc.   HOMELESS SHELTERS  [facility]     [facility]   [address] [place]      [phone]              [address] (women and children)       [address] [place], [place] [address] [phone] [email or web address] for application and process Application Required  [facility]   [address]    [place] [address]     [phone]                    [facility] [address] [place], [place] [address] [phone] application appt.) Application Required  [facility] (women only)    [address]     [place], [place] [address]     [phone]      Intake starts 6pm daily Need valid ID, SSC, & Police report [employer] [address] [place], [place] [phone] Application Required  [facility] (men only)     [address]      [place], [place]     [phone]       [facility] (Pregnant women only) [address] [place], [place] [phone]  [facility]      [address]      [place], [place] [address]     [phone]             [facility] [address] [place], [place] [phone] 90 day commitment/SA/Application process  [facility](men only)     [address]     [place], [place]     [phone]       Check-in at [facility] [address] [place], [place] [address] [phone] Men/Women/Women and Children must be there by 7 pm  [facility] [place], [place] [phone]

## 2015-03-08 ENCOUNTER — Emergency Department (HOSPITAL_COMMUNITY)
Admission: EM | Admit: 2015-03-08 | Discharge: 2015-03-08 | Disposition: A | Discharge status: Home / Self Care | Payer: Self-pay | Attending: Emergency Medicine | Admitting: Emergency Medicine

## 2015-03-08 ENCOUNTER — Encounter (HOSPITAL_COMMUNITY): Payer: Self-pay | Admitting: Emergency Medicine

## 2015-03-08 DIAGNOSIS — Z720 Tobacco use: Secondary | ICD-10-CM | POA: Insufficient documentation

## 2015-03-08 DIAGNOSIS — F319 Bipolar disorder, unspecified: Secondary | ICD-10-CM | POA: Insufficient documentation

## 2015-03-08 DIAGNOSIS — Z79899 Other long term (current) drug therapy: Secondary | ICD-10-CM | POA: Insufficient documentation

## 2015-03-08 DIAGNOSIS — F1020 Alcohol dependence, uncomplicated: Secondary | ICD-10-CM

## 2015-03-08 DIAGNOSIS — F1029 Alcohol dependence with unspecified alcohol-induced disorder: Secondary | ICD-10-CM | POA: Insufficient documentation

## 2015-03-08 LAB — CBG MONITORING, ED: Glucose-Capillary: 85 mg/dL (ref 65–99)

## 2015-03-08 NOTE — Discharge Instructions (Signed)
Alcohol Use Disorder °Alcohol use disorder is a mental disorder. It is not a one-time incident of heavy drinking. Alcohol use disorder is the excessive and uncontrollable use of alcohol over time that leads to problems with functioning in one or more areas of daily living. People with this disorder risk harming themselves and others when they drink to excess. Alcohol use disorder also can cause other mental disorders, such as mood and anxiety disorders, and serious physical problems. People with alcohol use disorder often misuse other drugs.  °Alcohol use disorder is common and widespread. Some people with this disorder drink alcohol to cope with or escape from negative life events. Others drink to relieve chronic pain or symptoms of mental illness. People with a family history of alcohol use disorder are at higher risk of losing control and using alcohol to excess.  °SYMPTOMS  °Signs and symptoms of alcohol use disorder may include the following:  °· Consumption of alcohol in larger amounts or over a longer period of time than intended. °· Multiple unsuccessful attempts to cut down or control alcohol use.   °· A great deal of time spent obtaining alcohol, using alcohol, or recovering from the effects of alcohol (hangover). °· A strong desire or urge to use alcohol (cravings).   °· Continued use of alcohol despite problems at work, school, or home because of alcohol use.   °· Continued use of alcohol despite problems in relationships because of alcohol use. °· Continued use of alcohol in situations when it is physically hazardous, such as driving a car. °· Continued use of alcohol despite awareness of a physical or psychological problem that is likely related to alcohol use. Physical problems related to alcohol use can involve the brain, heart, liver, stomach, and intestines. Psychological problems related to alcohol use include intoxication, depression, anxiety, psychosis, delirium, and dementia.   °· The need for  increased amounts of alcohol to achieve the same desired effect, or a decreased effect from the consumption of the same amount of alcohol (tolerance). °· Withdrawal symptoms upon reducing or stopping alcohol use, or alcohol use to reduce or avoid withdrawal symptoms. Withdrawal symptoms include: °· Racing heart. °· Hand tremor. °· Difficulty sleeping. °· Nausea. °· Vomiting. °· Hallucinations. °· Restlessness. °· Seizures. °DIAGNOSIS °Alcohol use disorder is diagnosed through an assessment by your health care provider. Your health care provider may start by asking three or four questions to screen for excessive or problematic alcohol use. To confirm a diagnosis of alcohol use disorder, at least two symptoms must be present within a 12-month period. The severity of alcohol use disorder depends on the number of symptoms: °· Mild--two or three. °· Moderate--four or five. °· Severe--six or more. °Your health care provider may perform a physical exam or use results from lab tests to see if you have physical problems resulting from alcohol use. Your health care provider may refer you to a mental health professional for evaluation. °TREATMENT  °Some people with alcohol use disorder are able to reduce their alcohol use to low-risk levels. Some people with alcohol use disorder need to quit drinking alcohol. When necessary, mental health professionals with specialized training in substance use treatment can help. Your health care provider can help you decide how severe your alcohol use disorder is and what type of treatment you need. The following forms of treatment are available:  °· Detoxification. Detoxification involves the use of prescription medicines to prevent alcohol withdrawal symptoms in the first week after quitting. This is important for people with a history of symptoms   of withdrawal and for heavy drinkers who are likely to have withdrawal symptoms. Alcohol withdrawal can be dangerous and, in severe cases, cause  death. Detoxification is usually provided in a hospital or in-patient substance use treatment facility.  Counseling or talk therapy. Talk therapy is provided by substance use treatment counselors. It addresses the reasons people use alcohol and ways to keep them from drinking again. The goals of talk therapy are to help people with alcohol use disorder find healthy activities and ways to cope with life stress, to identify and avoid triggers for alcohol use, and to handle cravings, which can cause relapse.  Medicines.Different medicines can help treat alcohol use disorder through the following actions:  Decrease alcohol cravings.  Decrease the positive reward response felt from alcohol use.  Produce an uncomfortable physical reaction when alcohol is used (aversion therapy).  Support groups. Support groups are run by people who have quit drinking. They provide emotional support, advice, and guidance. These forms of treatment are often combined. Some people with alcohol use disorder benefit from intensive combination treatment provided by specialized substance use treatment centers. Both inpatient and outpatient treatment programs are available. Document Released: 07/20/2004 Document Revised: 10/27/2013 Document Reviewed: 09/19/2012 [facility] Patient Information 2015 [place], [place]. This information is not intended to replace advice given to you by your health care provider. Make sure you discuss any questions you have with your health care provider.  Alcohol Withdrawal Alcohol withdrawal happens when you normally drink alcohol a lot and suddenly stop drinking. Alcohol withdrawal symptoms can be mild to very bad. Mild withdrawal symptoms can include feeling sick to your stomach (nauseous), headache, or feeling irritable. Bad withdrawal symptoms can include shakiness, being very nervous (anxious), and not thinking clearly.  HOME CARE  Join an alcohol support group.  Stay away from people or  situations that make you want to drink.  Eat a healthy diet. Eat a lot of fresh fruits, vegetables, and lean meats. GET HELP RIGHT AWAY IF:   You become confused. You start to see and hear things that are not really there.  You feel your heart beating very fast.  You throw up (vomit) blood or cannot stop throwing up. This may be bright red or look like black coffee grounds.  You have blood in your poop (stool). This may be bright red, maroon colored, or black and tarry.  You are lightheaded or pass out (faint).  You develop a fever. MAKE SURE YOU:   Understand these instructions.  Will watch your condition.  Will get help right away if you are not doing well or get worse. Document Released: 11/29/2007 Document Revised: 09/04/2011 Document Reviewed: 11/29/2007 [facility] Patient Information 2015 [place], [place]. This information is not intended to replace advice given to you by your health care provider. Make sure you discuss any questions you have with your health care provider.  Delirium Tremens Delirium tremens is the worst form of alcohol withdrawal. It usually happens 2 to 4 days after the last drink, but it may occur up to 7 to 10 days after the last drink. It involves sudden and severe changes in your mental and nervous system. Many people keep drinking to get rid of the discomfort felt during delirium tremens.Symptoms may last up to 7 days.Delirium tremens is a serious condition and can be life-threatening. Most people need treatment in a hospital or a detox unit for intravenous (IV) fluids, medicines to help with symptoms, and close monitoring by a trained staff.  CAUSES  Delirium  tremens can occur when you suddenly quit or greatly reduce the amount of alcohol you normally drink.  SYMPTOMS   Fast heart rate.  Elevated temperature.  High blood pressure.  Seizures.  Tremors.  Sweating.  Fast breathing.  Anxiety.  Lack of coordination.  Trouble thinking  clearly.  Nausea.  Seeing, hearing, or feeling things that are not really there (hallucinations). DIAGNOSIS   The diagnosis is usually made from the history of alcohol consumption and the presence of symptoms.  Medical tests may be done to rule out other conditions that can act like alcohol withdrawal. TREATMENT   Medicines are usually prescribed to prevent withdrawal symptoms. Some people need a calming drug (sedative) for a week or more until they are done with their withdrawal.  Going to the hospital is necessary if you are seriously ill. You may be given:  Thiamine to prevent a serious brain disorder.  Multivitamins and folate.  IV fluids if you are dehydrated.  Minerals.  Sedatives if you are anxious, agitated, or cannot sleep. HOME CARE INSTRUCTIONS  After treatment, you must:  Follow all instructions from your caregiver very carefully.  Take all medicines as prescribed. If you cannot, call your caregiver right away.  Keep all appointments for further evaluation and counseling.  Not use drugs, alcohol, or any other mind-altering or mood-altering drugs.  Not operate a motor vehicle or hazardous machinery until your caregiver says it is okay. SEEK IMMEDIATE MEDICAL CARE IF:   You have a seizure (convulsions).  You become very shaky or agitated.  You have severe nausea and vomiting.  You throw up blood. This may be bright red or look like black coffee grounds.  You have blood in your stool. This may be bright red, bad smelling, or appear black and tarry.  You become lightheaded or faint. If you have any of the above symptoms, do not drive. Have someone else drive you, or call your local emergency services (911 in [place] FOR MORE INFORMATION  Treatment centers are listed in telephone book under: Alcoholism and Addiction Treatment, Substance Abuse Treatment or Cocaine, Narcotics, and Alcoholics Anonymous. Most hospitals and clinics can refer you to a specialized  care center.  The [place] government maintains a toll-free number for getting treatment referrals: 408-477-3280 or 832-546-5996 (TDD). They also maintain a website: http://findtreatment.RockToxic.pl. Other websites for more information are: www.mentalhealth.RockToxic.pl and GreatestFeeling.tn.  In [place], treatment centers are listed in the telephone book under each [place]. Listings are available under: Oncologist for [employer] or similar titles. Document Released: 06/12/2005 Document Revised: 09/04/2011 Document Reviewed: 09/07/2010 [facility] Patient Information 2015 [place], [place]. This information is not intended to replace advice given to you by your health care provider. Make sure you discuss any questions you have with your health care provider.

## 2015-03-08 NOTE — ED Notes (Signed)
Bed: [facility] Expected date:  Expected time:  Means of arrival:  Comments: etoh 

## 2015-03-08 NOTE — ED Notes (Signed)
PA went to check on pt. Pt not in bed and has been walking in the hallway.

## 2015-03-08 NOTE — ED Notes (Signed)
Pt ambulatory to restroom

## 2015-03-08 NOTE — ED Notes (Signed)
Per EMS. Pt found intoxicated in [place] parking lot. Pt has been drinking listerine. Pt complaining of pain all over. Was unable to walk with EMS

## 2015-03-08 NOTE — ED Notes (Signed)
Gave pt crackers, peanut butter and water

## 2015-03-08 NOTE — ED Provider Notes (Signed)
CSN: [id number]     Arrival date & time 03/08/15  1147 History   First MD Initiated Contact with Patient 03/08/15 1216     Chief Complaint  Patient presents with  . Alcohol Intoxication  . Generalized Body Aches     (Consider location/radiation/quality/duration/timing/severity/associated sxs/prior Treatment) HPI   Jordan Davidson is a 44 y.o M the past medical history of alcohol use disorder and bipolar disorder, homelessness who presents to the emergency department today complaining of alcohol intoxication onset earlier today patient states that he drank a case of beer and a bottle or 2 of wine. Patient was brought into the emergency department by the police. She also states that sometimes he thinks were hurting himself but today he doesn't think that he would actually harm himself. Denies HI. Denies abdominal pain, vomiting, tremors, headache, hallucinations, chest pain, shortness of breath, fevers, seizures. Patient has ED care plan in place.  Past Medical History  Diagnosis Date  . Alcohol abuse   . Depression   . Bipolar 1 disorder, depressed    Past Surgical History  Procedure Laterality Date  . No past surgeries     Family History  Problem Relation Age of Onset  . Hypertension Mother   . Cancer Father   . Heart failure Father   . Alcoholism Father    Social History  Substance Use Topics  . Smoking status: Current Every Day Smoker  . Smokeless tobacco: None  . Alcohol Use: Yes     Comment: daily - "whatever I can get" - 1/5 plus daily    Review of Systems  All other systems reviewed and are negative.     Allergies  Review of patient's allergies indicates no known allergies.  Home Medications   Prior to Admission medications   Medication Sig Start Date End Date Taking? Authorizing Provider  acamprosate (CAMPRAL) 333 MG tablet Take 2 tablets (666 mg total) by mouth 3 (three) times daily with meals. For alcoholism, 02/16/15  Yes Sanjuana Kava, NP  FLUoxetine  (PROZAC) 20 MG capsule Take 1 capsule (20 mg total) by mouth daily. For depression 02/16/15  Yes Sanjuana Kava, NP  gabapentin (NEURONTIN) 300 MG capsule Take 1 capsule (300 mg total) by mouth 3 (three) times daily. For agitation 02/16/15  Yes Sanjuana Kava, NP  hydrocerin (EUCERIN) CREA Apply 1 application topically 2 (two) times daily. For dry skin 02/16/15  Yes Sanjuana Kava, NP  hydrOXYzine (ATARAX/VISTARIL) 50 MG tablet Take 1 tablet (50 mg total) by mouth at bedtime as needed for anxiety (insomnia). 02/16/15  Yes Sanjuana Kava, NP  OLANZapine (ZYPREXA) 10 MG tablet Take 1 tablet (10 mg total) by mouth at bedtime. For mood control 02/16/15  Yes Sanjuana Kava, NP  Soft Lens Products (SALINE SENSITIVE EYES) SOLN Place 1 drop into both eyes 2 (two) times daily.   Yes Historical Provider, MD  traZODone (DESYREL) 150 MG tablet Take 1 tablet (150 mg total) by mouth at bedtime. 02/16/15  Yes Sanjuana Kava, NP  nicotine (NICODERM CQ - DOSED IN MG/24 HOURS) 14 mg/24hr patch Place 1 patch (14 mg total) onto the skin daily. For smoking cessation Patient not taking: Reported on 03/08/2015 02/16/15   Sanjuana Kava, NP   BP 101/67 mmHg  Pulse 85  Temp(Src) 97.7 F (36.5 C) (Oral)  Resp 16  SpO2 92% Physical Exam  Constitutional: He is oriented to person, place, and time. He appears well-developed and well-nourished. No distress.  Patient  asleep in bed curled up. Smells of urine and alcohol.  HENT:  Head: Normocephalic and atraumatic.  Eyes: Conjunctivae and EOM are normal. Pupils are equal, round, and reactive to light. Right eye exhibits no discharge. Left eye exhibits no discharge. No scleral icterus.  Cardiovascular: Normal rate, regular rhythm, normal heart sounds and intact distal pulses.  Exam reveals no gallop and no friction rub.   No murmur heard. Pulmonary/Chest: Effort normal and breath sounds normal. No respiratory distress. He has no wheezes. He has no rales. He exhibits no tenderness.   Abdominal: Soft. Bowel sounds are normal. He exhibits no distension and no mass. There is no tenderness. There is no rebound and no guarding.  Musculoskeletal: Normal range of motion. He exhibits no edema or tenderness.  Neurological: He is alert and oriented to person, place, and time.  Skin: Skin is warm and dry. No rash noted. He is not diaphoretic. No erythema. No pallor.  Nursing note and vitals reviewed.   ED Course  Procedures (including critical care time)  Glucose normal  Will let patient sober up in the emergency department.  Patient ambulating around the hospital.  Labs Review Labs Reviewed  CBG MONITORING, ED    Imaging Review No results found. I have personally reviewed and evaluated these images and lab results as part of my medical decision-making.   EKG Interpretation None      MDM   Final diagnoses:  Alcohol use disorder, severe, dependence    Patient seen for alcoholic intoxication. Denies suicidal or homicidal ideation at this time. No evidence of delirium tremens. Patient relating her on the hospital. Stable for discharge.Return precautions outlined in patient discharge instructions.   ED care plan in place. Do not recommend TTS consult or additional lab work.     Lester Kinsman [place], PA-C 03/08/15 1821  Donnetta Hutching, MD 03/09/15 1245

## 2015-03-18 ENCOUNTER — Emergency Department (HOSPITAL_COMMUNITY)
Admission: EM | Admit: 2015-03-18 | Discharge: 2015-03-18 | Discharge status: Against Medical Advice | Payer: Self-pay | Attending: Emergency Medicine | Admitting: Emergency Medicine

## 2015-03-18 ENCOUNTER — Encounter (HOSPITAL_COMMUNITY): Payer: Self-pay | Admitting: *Deleted

## 2015-03-18 ENCOUNTER — Emergency Department (HOSPITAL_COMMUNITY): Payer: Self-pay

## 2015-03-18 ENCOUNTER — Encounter (HOSPITAL_COMMUNITY): Payer: Self-pay | Admitting: Emergency Medicine

## 2015-03-18 ENCOUNTER — Emergency Department (HOSPITAL_COMMUNITY)
Admission: EM | Admit: 2015-03-18 | Discharge: 2015-03-18 | Disposition: A | Discharge status: Home / Self Care | Payer: Self-pay | Attending: Emergency Medicine | Admitting: Emergency Medicine

## 2015-03-18 DIAGNOSIS — Z720 Tobacco use: Secondary | ICD-10-CM | POA: Insufficient documentation

## 2015-03-18 DIAGNOSIS — Z590 Homelessness: Secondary | ICD-10-CM | POA: Insufficient documentation

## 2015-03-18 DIAGNOSIS — Y9289 Other specified places as the place of occurrence of the external cause: Secondary | ICD-10-CM | POA: Insufficient documentation

## 2015-03-18 DIAGNOSIS — F10120 Alcohol abuse with intoxication, uncomplicated: Secondary | ICD-10-CM | POA: Insufficient documentation

## 2015-03-18 DIAGNOSIS — W19XXXA Unspecified fall, initial encounter: Secondary | ICD-10-CM

## 2015-03-18 DIAGNOSIS — S0091XA Abrasion of unspecified part of head, initial encounter: Secondary | ICD-10-CM

## 2015-03-18 DIAGNOSIS — W1839XA Other fall on same level, initial encounter: Secondary | ICD-10-CM | POA: Insufficient documentation

## 2015-03-18 DIAGNOSIS — F10920 Alcohol use, unspecified with intoxication, uncomplicated: Secondary | ICD-10-CM

## 2015-03-18 DIAGNOSIS — F319 Bipolar disorder, unspecified: Secondary | ICD-10-CM | POA: Insufficient documentation

## 2015-03-18 DIAGNOSIS — Y9389 Activity, other specified: Secondary | ICD-10-CM | POA: Insufficient documentation

## 2015-03-18 DIAGNOSIS — S0011XA Contusion of right eyelid and periocular area, initial encounter: Secondary | ICD-10-CM | POA: Insufficient documentation

## 2015-03-18 DIAGNOSIS — Y998 Other external cause status: Secondary | ICD-10-CM | POA: Insufficient documentation

## 2015-03-18 DIAGNOSIS — Z79899 Other long term (current) drug therapy: Secondary | ICD-10-CM | POA: Insufficient documentation

## 2015-03-18 DIAGNOSIS — S0081XA Abrasion of other part of head, initial encounter: Secondary | ICD-10-CM | POA: Insufficient documentation

## 2015-03-18 NOTE — ED Notes (Signed)
Pt refusing to stay in bed.  Security and [employer] at bedside.  Pt reports to [employer] that he is wanting to leave.  Security and [employer] escorting pt out of department.

## 2015-03-18 NOTE — Progress Notes (Signed)
Pt noted to ask any staff member near him or walking by for "a sandwich or cheese and crackers" Pt is frequently being told why he is not able to get food at this time Pt stated he would leave if not given food

## 2015-03-18 NOTE — ED Notes (Signed)
Pt back from CT

## 2015-03-18 NOTE — Progress Notes (Signed)
Pt confirms he is still seen at [facility] by [facility]

## 2015-03-18 NOTE — ED Notes (Signed)
Pt to CT

## 2015-03-18 NOTE — ED Notes (Signed)
Pt laying on stretcher talking on his cell phone.

## 2015-03-18 NOTE — ED Notes (Signed)
Pt ambulatory with steady gait.   [facility] office called and stated that he will be coming to transport pt to [facility].

## 2015-03-18 NOTE — ED Notes (Signed)
Pt was observed by this Charge RN walking out of the department w/ a steady gait.

## 2015-03-18 NOTE — ED Notes (Addendum)
Pt was in ED earlier today for ETOH intoxication, pt decided to leave AMA, [employer] found pt back in lobby. Now pt has hematoma above right eye. Denies pain. Alert and oriented x4.   Pt also has bloody spot to back of head.   Pt requesting to use the bathroom. Pt needs assistance walking. Pt attempted to walk by himself and pt is unable to walk at this time by himself. Pt wanting to leave. rn explained that pt is not able to leave until he can safely walk by himself.

## 2015-03-18 NOTE — ED Notes (Addendum)
Per ems pt was found by [facility] police department, [employer] said pt looked like he was in distress, healing abrasion/hematoma to back of head, pt is ambulatory, [employer] saw pt walking. Pt say "he may have fallen". Pt refused immobilization.   At 1135 pt requesting to use bathroom, pt ambulated with assistance, but needed assistance- arm held while pt urinated.

## 2015-03-18 NOTE — ED Notes (Signed)
Received phone call from [facility] police, stating that he was supposed to transport pt to [facility] today between 3-4pm.  Office asking if pt still wants to go to [facility] he will make arrangements and transport pt. Pt still wanting to go to [facility].  Made Will PA aware.

## 2015-03-18 NOTE — Discharge Instructions (Signed)
Alcohol Intoxication Alcohol intoxication occurs when you drink enough alcohol that it affects your ability to function. It can be mild or very severe. Drinking a lot of alcohol in a short time is called binge drinking. This can be very harmful. Drinking alcohol can also be more dangerous if you are taking medicines or other drugs. Some of the effects caused by alcohol may include:  Loss of coordination.  Changes in mood and behavior.  Unclear thinking.  Trouble talking (slurred speech).  Throwing up (vomiting).  Confusion.  Slowed breathing.  Twitching and shaking (seizures).  Loss of consciousness. HOME CARE  Do not drive after drinking alcohol.  Drink enough water and fluids to keep your pee (urine) clear or pale yellow. Avoid caffeine.  Only take medicine as told by your doctor. GET HELP IF:  You throw up (vomit) many times.  You do not feel better after a few days.  You frequently have alcohol intoxication. Your doctor can help decide if you should see a substance use treatment counselor. GET HELP RIGHT AWAY IF:  You become shaky when you stop drinking.  You have twitching and shaking.  You throw up blood. It may look bright red or like coffee grounds.  You notice blood in your poop (bowel movements).  You become lightheaded or pass out (faint). MAKE SURE YOU:   Understand these instructions.  Will watch your condition.  Will get help right away if you are not doing well or get worse. Document Released: 11/29/2007 Document Revised: 02/12/2013 Document Reviewed: 11/15/2012 [facility] Patient Information 2015 [place], [place]. This information is not intended to replace advice given to you by your health care provider. Make sure you discuss any questions you have with your health care provider. Head Injury You have received a head injury. It does not appear serious at this time. Headaches and vomiting are common following head injury. It should be easy to awaken  from sleeping. Sometimes it is necessary for you to stay in the emergency department for a while for observation. Sometimes admission to the hospital may be needed. After injuries such as yours, most problems occur within the first 24 hours, but side effects may occur up to 7-10 days after the injury. It is important for you to carefully monitor your condition and contact your health care provider or seek immediate medical care if there is a change in your condition. WHAT ARE THE TYPES OF HEAD INJURIES? Head injuries can be as minor as a bump. Some head injuries can be more severe. More severe head injuries include:  A jarring injury to the brain (concussion).  A bruise of the brain (contusion). This mean there is bleeding in the brain that can cause swelling.  A cracked skull (skull fracture).  Bleeding in the brain that collects, clots, and forms a bump (hematoma). WHAT CAUSES A HEAD INJURY? A serious head injury is most likely to happen to someone who is in a car wreck and is not wearing a seat belt. Other causes of major head injuries include bicycle or motorcycle accidents, sports injuries, and falls. HOW ARE HEAD INJURIES DIAGNOSED? A complete history of the event leading to the injury and your current symptoms will be helpful in diagnosing head injuries. Many times, pictures of the brain, such as CT or MRI are needed to see the extent of the injury. Often, an overnight hospital stay is necessary for observation.  WHEN SHOULD I SEEK IMMEDIATE MEDICAL CARE?  You should get help right away if:  You have confusion or drowsiness.  You feel sick to your stomach (nauseous) or have continued, forceful vomiting.  You have dizziness or unsteadiness that is getting worse.  You have severe, continued headaches not relieved by medicine. Only take over-the-counter or prescription medicines for pain, fever, or discomfort as directed by your health care provider.  You do not have normal function of  the arms or legs or are unable to walk.  You notice changes in the black spots in the center of the colored part of your eye (pupil).  You have a clear or bloody fluid coming from your nose or ears.  You have a loss of vision. During the next 24 hours after the injury, you must stay with someone who can watch you for the warning signs. This person should contact local emergency services (911 in the [place] if you have seizures, you become unconscious, or you are unable to wake up. HOW CAN I PREVENT A HEAD INJURY IN THE FUTURE? The most important factor for preventing major head injuries is avoiding motor vehicle accidents. To minimize the potential for damage to your head, it is crucial to wear seat belts while riding in motor vehicles. Wearing helmets while bike riding and playing collision sports (like football) is also helpful. Also, avoiding dangerous activities around the house will further help reduce your risk of head injury.  WHEN CAN I RETURN TO NORMAL ACTIVITIES AND ATHLETICS? You should be reevaluated by your health care provider before returning to these activities. If you have any of the following symptoms, you should not return to activities or contact sports until 1 week after the symptoms have stopped:  Persistent headache.  Dizziness or vertigo.  Poor attention and concentration.  Confusion.  Memory problems.  Nausea or vomiting.  Fatigue or tire easily.  Irritability.  Intolerant of bright lights or loud noises.  Anxiety or depression.  Disturbed sleep. MAKE SURE YOU:   Understand these instructions.  Will watch your condition.  Will get help right away if you are not doing well or get worse. Document Released: 06/12/2005 Document Revised: 06/17/2013 Document Reviewed: 02/17/2013 [facility] Patient Information 2015 [place], [place]. This information is not intended to replace advice given to you by your health care provider. Make sure you discuss any questions  you have with your health care provider.

## 2015-03-18 NOTE — ED Notes (Addendum)
Pt requesting food to eat, rn and tech explained pt is not going to be given food per care plan.   Pt was going through his medications, saying he wanted to make sure he had not missed any. He had recently filled prescriptions for trazodone, prednisone, and gabapentin. rn told pt to wait on taking gabapentin until he sobered up. Pt did not take any of his medications at this time.

## 2015-03-18 NOTE — ED Notes (Signed)
Bed: [facility] Expected date:  Expected time:  Means of arrival:  Comments: EMS- 44yo M, fall/ETOH

## 2015-03-18 NOTE — ED Provider Notes (Signed)
CSN: [id number]     Arrival date & time 03/18/15  1349 History   First MD Initiated Contact with Patient 03/18/15 1453     Chief Complaint  Patient presents with  . Alcohol Intoxication  . Fall   Jordan Davidson is a 44 y.o. male with a history of alcohol abuse, bipolar disorder, homelessness and depression who presents to the emergency department intoxicated. The patient was initially found by [facility] Department earlier today with a healing abrasion to the posterior head. The patient was ambulatory and refused immobilization was brought to the emergency department by EMS. While in the emergency department he refused to stay in the bed and left AMA after brief stay in the ED. The patient was then found back in [place] [facility] emergency department lobby with the hematoma to his right eye. He appeared intoxicated and needed help walking to the stretcher. At the time of my evaluation the patient refuses to speak to me.   (Consider location/radiation/quality/duration/timing/severity/associated sxs/prior Treatment) HPI  Past Medical History  Diagnosis Date  . Alcohol abuse   . Depression   . Bipolar 1 disorder, depressed    Past Surgical History  Procedure Laterality Date  . No past surgeries     Family History  Problem Relation Age of Onset  . Hypertension Mother   . Cancer Father   . Heart failure Father   . Alcoholism Father    Social History  Substance Use Topics  . Smoking status: Current Every Day Smoker  . Smokeless tobacco: None  . Alcohol Use: Yes     Comment: daily - "whatever I can get" - 1/5 plus daily    Review of Systems  Unable to perform ROS: Other  The patient is uncooperative with exam and interview.     Allergies  Review of patient's allergies indicates no known allergies.  Home Medications   Prior to Admission medications   Medication Sig Start Date End Date Taking? Authorizing Provider  acamprosate (CAMPRAL) 333 MG tablet Take 2 tablets (666 mg total) by  mouth 3 (three) times daily with meals. For alcoholism, 02/16/15   Sanjuana Kava, NP  FLUoxetine (PROZAC) 20 MG capsule Take 1 capsule (20 mg total) by mouth daily. For depression 02/16/15   Sanjuana Kava, NP  gabapentin (NEURONTIN) 300 MG capsule Take 1 capsule (300 mg total) by mouth 3 (three) times daily. For agitation 02/16/15   Sanjuana Kava, NP  hydrocerin (EUCERIN) CREA Apply 1 application topically 2 (two) times daily. For dry skin 02/16/15   Sanjuana Kava, NP  hydrOXYzine (ATARAX/VISTARIL) 50 MG tablet Take 1 tablet (50 mg total) by mouth at bedtime as needed for anxiety (insomnia). 02/16/15   Sanjuana Kava, NP  nicotine (NICODERM CQ - DOSED IN MG/24 HOURS) 14 mg/24hr patch Place 1 patch (14 mg total) onto the skin daily. For smoking cessation Patient not taking: Reported on 03/08/2015 02/16/15   Sanjuana Kava, NP  OLANZapine (ZYPREXA) 10 MG tablet Take 1 tablet (10 mg total) by mouth at bedtime. For mood control 02/16/15   Sanjuana Kava, NP  Soft Lens Products (SALINE SENSITIVE EYES) SOLN Place 1 drop into both eyes 2 (two) times daily.    Historical Provider, MD  traZODone (DESYREL) 150 MG tablet Take 1 tablet (150 mg total) by mouth at bedtime. 02/16/15   Sanjuana Kava, NP   BP 131/89 mmHg  Pulse 85  Temp(Src) 97.5 F (36.4 C) (Oral)  Resp 20  SpO2  100% Physical Exam  Constitutional: He appears well-developed and well-nourished. No distress.  Nontoxic appearing. Smells slightly of alcohol.  HENT:  Head: Normocephalic.  Right Ear: External ear normal.  Left Ear: External ear normal.  Small  Non-bleeding abrasion to his posterior head. Small hematoma to above his right eye.   Eyes: Conjunctivae are normal. Pupils are equal, round, and reactive to light. Right eye exhibits no discharge. Left eye exhibits no discharge.  Neck: Normal range of motion. Neck supple.  Cardiovascular: Normal rate, normal heart sounds and intact distal pulses.   Bilateral radial pulses are intact.    Pulmonary/Chest: Effort normal. No respiratory distress.  Abdominal: Soft.  Musculoskeletal: He exhibits no edema.  Patient is spontaneously moving all extremities in a coordinated fashion exhibiting good strength.   Neurological: He is alert. Coordination normal.  Spontaneously opening his eyes. Will not answer my questions.   Skin: Skin is warm and dry. No rash noted. He is not diaphoretic. No erythema. No pallor.  Psychiatric: He has a normal mood and affect. His behavior is normal.  Nursing note and vitals reviewed.   ED Course  Procedures (including critical care time) Labs Review Labs Reviewed - No data to display  Imaging Review Ct Head Wo Contrast  03/18/2015   CLINICAL DATA:  Pt was in ED earlier today for ETOH intoxication, pt decided to leave AMA, [employer] found pt back in lobby. Now pt has hematoma above right eye. Denies pain. Alert and oriented x4. Pt also has bloody spot to back of head.  EXAM: CT HEAD WITHOUT CONTRAST  TECHNIQUE: Contiguous axial images were obtained from the base of the skull through the vertex without intravenous contrast.  COMPARISON:  08/30/2014  FINDINGS: Ventricles normal size and configuration. There are no parenchymal masses or mass effect. There is no evidence of an infarct. There are no extra-axial masses or abnormal fluid collections.  There is no intracranial hemorrhage.  Small area of contusion along the superior lateral right orbit. No skull fracture.  Visualized sinuses and mastoid air cells are clear.  IMPRESSION: 1. No acute intracranial abnormalities.  No skull fracture.   Electronically Signed   By: Amie Portland M.D.   On: 03/18/2015 15:04   I have personally reviewed and evaluated these images as part of my medical decision-making.   EKG Interpretation None      Filed Vitals:   03/18/15 1354 03/18/15 1726  BP: 113/74 131/89  Pulse: 78 85  Temp: 98.1 F (36.7 C) 97.5 F (36.4 C)  TempSrc: Oral Oral  Resp: 14 20  SpO2: 99% 100%      MDM   Meds given in ED:  Medications - No data to display  Discharge Medication List as of 03/18/2015  5:34 PM      Final diagnoses:  Alcohol intoxication, uncomplicated  Fall, initial encounter  Abrasion of head, initial encounter   This is a 44 y.o. male with a history of alcohol abuse, bipolar disorder, homelessness and depression who presents to the emergency department intoxicated. The patient was initially found by [facility] Department earlier today with a healing abrasion to the posterior head. The patient was ambulatory and refused immobilization was brought to the emergency department by EMS. While in the emergency department he refused to stay in the bed and left AMA after brief stay in the ED. The patient was then found back in [place] [facility] emergency department lobby with the hematoma to his right eye. He appeared intoxicated and needed  help walking to the stretcher. At the time of my initial evaluation the patient refuses to speak to me.  The patient is afebrile nontoxic appearing. Patient is spontaneously moving all extremities in a coordinated fashion exhibiting good strength. The patient has a healing abrasion to his posterior head and a small hematoma above his right eye. Head CT is unremarkable.  Plan is for the patient to sober up and discharge when clinically sober. Later I was advised that the patient has a spot [facility] and that [facility] police will take him there. The patient reports he would like to go there. He is able to ambulate without difficulty or assistance and is clinically sober and will discharge him to [facility] by [facility] police.      Everlene Farrier, PA-C 03/18/15 1923  Melene Plan, DO 03/19/15 7829

## 2015-03-19 ENCOUNTER — Emergency Department: Payer: Self-pay

## 2015-03-19 ENCOUNTER — Emergency Department
Admission: EM | Admit: 2015-03-19 | Discharge: 2015-03-19 | Disposition: A | Discharge status: Home / Self Care | Payer: Self-pay | Attending: Emergency Medicine | Admitting: Emergency Medicine

## 2015-03-19 ENCOUNTER — Encounter: Payer: Self-pay | Admitting: Emergency Medicine

## 2015-03-19 DIAGNOSIS — Z792 Long term (current) use of antibiotics: Secondary | ICD-10-CM | POA: Insufficient documentation

## 2015-03-19 DIAGNOSIS — S7001XA Contusion of right hip, initial encounter: Secondary | ICD-10-CM | POA: Insufficient documentation

## 2015-03-19 DIAGNOSIS — Y9289 Other specified places as the place of occurrence of the external cause: Secondary | ICD-10-CM | POA: Insufficient documentation

## 2015-03-19 DIAGNOSIS — Z720 Tobacco use: Secondary | ICD-10-CM | POA: Insufficient documentation

## 2015-03-19 DIAGNOSIS — Y998 Other external cause status: Secondary | ICD-10-CM | POA: Insufficient documentation

## 2015-03-19 DIAGNOSIS — Z79899 Other long term (current) drug therapy: Secondary | ICD-10-CM | POA: Insufficient documentation

## 2015-03-19 DIAGNOSIS — Y9389 Activity, other specified: Secondary | ICD-10-CM | POA: Insufficient documentation

## 2015-03-19 DIAGNOSIS — S40819A Abrasion of unspecified upper arm, initial encounter: Secondary | ICD-10-CM | POA: Insufficient documentation

## 2015-03-19 DIAGNOSIS — R0602 Shortness of breath: Secondary | ICD-10-CM | POA: Insufficient documentation

## 2015-03-19 DIAGNOSIS — F319 Bipolar disorder, unspecified: Secondary | ICD-10-CM | POA: Insufficient documentation

## 2015-03-19 DIAGNOSIS — W19XXXA Unspecified fall, initial encounter: Secondary | ICD-10-CM | POA: Insufficient documentation

## 2015-03-19 DIAGNOSIS — S20211A Contusion of right front wall of thorax, initial encounter: Secondary | ICD-10-CM | POA: Insufficient documentation

## 2015-03-19 MED ORDER — IBUPROFEN 800 MG PO TABS: 800.0000 mg | ORAL_TABLET | Freq: Three times a day (TID) | ORAL | Status: DC

## 2015-03-19 NOTE — Discharge Instructions (Signed)
Chest Contusion °A contusion is a deep bruise. Bruises happen when an injury causes bleeding under the skin. Signs of bruising include pain, puffiness (swelling), and discolored skin. The bruise may turn blue, purple, or yellow.  °HOME CARE °· Put ice on the injured area. °¨ Put ice in a plastic bag. °¨ Place a towel between the skin and the bag. °¨ Leave the ice on for 15-20 minutes at a time, 03-04 times a day for the first 48 hours. °· Only take medicine as told by your doctor. °· Rest. °· Take deep breaths (deep-breathing exercises) as told by your doctor. °· Stop smoking if you smoke. °· Do not lift objects over 5 pounds (2.3 kilograms) for 3 days or longer if told by your doctor. °GET HELP RIGHT AWAY IF:  °· You have more bruising or puffiness. °· You have pain that gets worse. °· You have trouble breathing. °· You are dizzy, weak, or pass out (faint). °· You have blood in your pee (urine) or poop (stool). °· You cough up or throw up (vomit) blood. °· Your puffiness or pain is not helped with medicines. °MAKE SURE YOU:  °· Understand these instructions. °· Will watch your condition. °· Will get help right away if you are not doing well or get worse. °Document Released: 11/29/2007 Document Revised: 03/06/2012 Document Reviewed: 12/04/2011 °[employer]® Patient Information ©2015 [employer]. This information is not intended to replace advice given to you by your health care provider. Make sure you discuss any questions you have with your health care provider. ° °

## 2015-03-19 NOTE — ED Notes (Signed)
States he thinks he fell about 1 week ago. Unsure may have been d/t having a sz. Bruising noted to right side of forehead, right arm ,right rib area and right lateral abd. States he was seen and placed on doxy and prednisone b/c of rib bruising ?Marland Kitchen Hx of etoh sz. And is currently ar [facility]

## 2015-03-19 NOTE — ED Notes (Signed)
Reports falling a week ago.  Pain in right ribs since.  No resp distress

## 2015-03-19 NOTE — ED Provider Notes (Signed)
[name] Emergency Department Provider Note  ____________________________________________  Time seen: Approximately 7:51 AM  I have reviewed the triage vital signs and the nursing notes.   HISTORY  Chief Complaint Rib Injury   HPI Jordan Davidson is a 44 y.o. male patient states that he fell one week ago with possible injury to his right rib area.Patient was seen at ER in [place] after her seizure activity. He had a CT of his head which was negative for acute injury. Patient states he was placed on doxycycline and and prednisone because of a "skin infection". Patient states that currently he is at [facility]. He rates his pain is 9/10. It is increased with coughing or deep breath. He states that he is a smoker at half pack cigarettes per day as having difficulty coughing up phlegm due to the pain.   Past Medical History  Diagnosis Date  . Alcohol abuse   . Depression   . Bipolar 1 disorder, depressed     Patient Active Problem List   Diagnosis Date Noted  . Bipolar I disorder, most recent episode (or current) unspecified 02/14/2015  . Alcohol use disorder, severe, dependence 02/13/2015    Past Surgical History  Procedure Laterality Date  . No past surgeries      Current Outpatient Rx  Name  Route  Sig  Dispense  Refill  . doxycycline (MONODOX) 100 MG capsule   Oral   Take 100 mg by mouth 2 (two) times daily.         Marland Kitchen acamprosate (CAMPRAL) 333 MG tablet   Oral   Take 2 tablets (666 mg total) by mouth 3 (three) times daily with meals. For alcoholism,   180 tablet   0   . FLUoxetine (PROZAC) 20 MG capsule   Oral   Take 1 capsule (20 mg total) by mouth daily. For depression   30 capsule   0   . gabapentin (NEURONTIN) 300 MG capsule   Oral   Take 1 capsule (300 mg total) by mouth 3 (three) times daily. For agitation   90 capsule   0   . hydrocerin (EUCERIN) CREA   Topical   Apply 1 application topically 2 (two) times daily. For dry skin     0   . hydrOXYzine (ATARAX/VISTARIL) 50 MG tablet   Oral   Take 1 tablet (50 mg total) by mouth at bedtime as needed for anxiety (insomnia).   30 tablet   0   . ibuprofen (ADVIL,MOTRIN) 800 MG tablet   Oral   Take 1 tablet (800 mg total) by mouth 3 (three) times daily.   30 tablet   0   . nicotine (NICODERM CQ - DOSED IN MG/24 HOURS) 14 mg/24hr patch   Transdermal   Place 1 patch (14 mg total) onto the skin daily. For smoking cessation Patient not taking: Reported on 03/08/2015   28 patch   0   . OLANZapine (ZYPREXA) 10 MG tablet   Oral   Take 1 tablet (10 mg total) by mouth at bedtime. For mood control   30 tablet   0   . Soft Lens Products (SALINE SENSITIVE EYES) SOLN   Both Eyes   Place 1 drop into both eyes 2 (two) times daily.         . traZODone (DESYREL) 150 MG tablet   Oral   Take 1 tablet (150 mg total) by mouth at bedtime.   30 tablet   0  Allergies Review of patient's allergies indicates no known allergies.  Family History  Problem Relation Age of Onset  . Hypertension Mother   . Cancer Father   . Heart failure Father   . Alcoholism Father     Social History Social History  Substance Use Topics  . Smoking status: Current Every Day Smoker  . Smokeless tobacco: None  . Alcohol Use: Yes     Comment: daily - "whatever I can get" - 1/5 plus daily    Review of Systems Constitutional: No fever/chills Eyes: No visual changes. ENT: No sore throat. Cardiovascular: Positive for right lateral chest pain. Respiratory: Positive shortness of breath due to rib pain. Gastrointestinal: No abdominal pain.  No nausea, no vomiting.  Genitourinary: Negative for dysuria. Musculoskeletal: Negative for back pain. Skin: Negative for rash. Neurological: Negative for headaches, focal weakness or numbness.  10-point ROS otherwise negative.  ____________________________________________   PHYSICAL EXAM:  VITAL SIGNS: ED Triage Vitals  Enc Vitals  Group     BP 03/19/15 0720 139/91 mmHg     Pulse Rate 03/19/15 0720 90     Resp 03/19/15 0720 16     Temp 03/19/15 0720 98.3 F (36.8 C)     Temp Source 03/19/15 0720 Oral     SpO2 03/19/15 0720 97 %     Weight 03/19/15 0720 185 lb (83.915 kg)     Height 03/19/15 0720 6' (1.829 m)     Head Cir --      Peak Flow --      Pain Score 03/19/15 0720 9     Pain Loc --      Pain Edu? --      Excl. in GC? --     Constitutional: Alert and oriented. Well appearing and in no acute distress. Eyes: Conjunctivae are normal. PERRL. EOMI. Head: Atraumatic. Nose: No congestion/rhinnorhea. Neck: No stridor.   Cardiovascular: Normal rate, regular rhythm. Grossly normal heart sounds.  Good peripheral circulation. Respiratory: Normal respiratory effort.  No retractions. Lungs CTAB. There is tenderness on palpation of the right lateral anterior ribs approximately 7,8, and 9. No gross deformity is noted. Gastrointestinal: Soft and nontender. No distention. Musculoskeletal: Moves upper extremities without any difficulty. No lower extremity tenderness nor edema.  No joint effusions. Neurologic:  Normal speech and language. No gross focal neurologic deficits are appreciated. No gait instability. Skin:  Skin is warm, dry and intact. No rash noted. There is bruising across the right lateral ribs in question. There is also a resolving bruise on the right hip. There are multiple abrasions to upper extremities without infection seen. Psychiatric: Mood and affect are normal. Speech and behavior are normal.  ____________________________________________   LABS (all labs ordered are listed, but only abnormal results are displayed)  Labs Reviewed - No data to display RADIOLOGY  Right rib and chest x-ray didn't show any evidence of rib fracture. Hyperexpansion without acute cardio pulmonary findings per radiologist. Beaulah Corin, personally viewed and evaluated these images (plain radiographs) as part of  my medical decision making.  ____________________________________________   PROCEDURES  Procedure(s) performed: None  Critical Care performed: No  ____________________________________________   INITIAL IMPRESSION / ASSESSMENT AND PLAN / ED COURSE  Pertinent labs & imaging results that were available during my care of the patient were reviewed by me and considered in my medical decision making (see chart for details).   patient was given a prescription for ibuprofen. He is to follow-up with his doctor if any continued  problems._________________________________________   FINAL CLINICAL IMPRESSION(S) / ED DIAGNOSES  Final diagnoses:  Rib contusion, right, initial encounter      Tommi Rumps, PA-C 03/19/15 1610  Minna Antis, MD 03/19/15 1600

## 2015-04-17 ENCOUNTER — Encounter (HOSPITAL_COMMUNITY): Payer: Self-pay | Admitting: Oncology

## 2015-04-17 ENCOUNTER — Emergency Department (HOSPITAL_COMMUNITY)
Admission: EM | Admit: 2015-04-17 | Discharge: 2015-04-17 | Disposition: A | Discharge status: Home / Self Care | Payer: Self-pay | Attending: Emergency Medicine | Admitting: Emergency Medicine

## 2015-04-17 DIAGNOSIS — Z79899 Other long term (current) drug therapy: Secondary | ICD-10-CM | POA: Insufficient documentation

## 2015-04-17 DIAGNOSIS — F319 Bipolar disorder, unspecified: Secondary | ICD-10-CM | POA: Insufficient documentation

## 2015-04-17 DIAGNOSIS — F10920 Alcohol use, unspecified with intoxication, uncomplicated: Secondary | ICD-10-CM

## 2015-04-17 DIAGNOSIS — Z720 Tobacco use: Secondary | ICD-10-CM | POA: Insufficient documentation

## 2015-04-17 DIAGNOSIS — Z590 Homelessness: Secondary | ICD-10-CM | POA: Insufficient documentation

## 2015-04-17 DIAGNOSIS — F10120 Alcohol abuse with intoxication, uncomplicated: Secondary | ICD-10-CM | POA: Insufficient documentation

## 2015-04-17 DIAGNOSIS — Z791 Long term (current) use of non-steroidal anti-inflammatories (NSAID): Secondary | ICD-10-CM | POA: Insufficient documentation

## 2015-04-17 NOTE — ED Provider Notes (Signed)
CSN: [id number]     Arrival date & time 04/17/15  0018 History   First MD Initiated Contact with Patient 04/17/15 0032     Chief Complaint  Patient presents with  . Alcohol Intoxication     (Consider location/radiation/quality/duration/timing/severity/associated sxs/prior Treatment) HPI Comments: Patient is an alcoholic who drinks daily does not want detox and is homeless  Brought by [employer] after stumbling in their presence. Patient has no complaints except that he is tired  Patient is a 44 y.o. male presenting with intoxication. The history is provided by the patient.  Alcohol Intoxication This is a chronic problem. The problem occurs constantly. The problem has been unchanged. Pertinent negatives include no fever or weakness. Nothing aggravates the symptoms. He has tried nothing for the symptoms. The treatment provided no relief.    Past Medical History  Diagnosis Date  . Alcohol abuse   . Depression   . Bipolar 1 disorder, depressed (HCC)    Past Surgical History  Procedure Laterality Date  . No past surgeries     Family History  Problem Relation Age of Onset  . Hypertension Mother   . Cancer Father   . Heart failure Father   . Alcoholism Father    Social History  Substance Use Topics  . Smoking status: Current Every Day Smoker  . Smokeless tobacco: None  . Alcohol Use: Yes     Comment: daily - "whatever I can get" - 1/5 plus daily    Review of Systems  Constitutional: Negative for fever.  Respiratory: Negative for shortness of breath.   Musculoskeletal: Negative for gait problem.  Neurological: Negative for weakness.  Psychiatric/Behavioral: Negative for suicidal ideas.  All other systems reviewed and are negative.     Allergies  Review of patient's allergies indicates no known allergies.  Home Medications   Prior to Admission medications   Medication Sig Start Date End Date Taking? Authorizing Provider  acamprosate (CAMPRAL) 333 MG tablet Take 2 tablets  (666 mg total) by mouth 3 (three) times daily with meals. For alcoholism, 02/16/15  Yes Sanjuana Kava, NP  FLUoxetine (PROZAC) 20 MG capsule Take 1 capsule (20 mg total) by mouth daily. For depression 02/16/15  Yes Sanjuana Kava, NP  gabapentin (NEURONTIN) 300 MG capsule Take 1 capsule (300 mg total) by mouth 3 (three) times daily. For agitation 02/16/15  Yes Sanjuana Kava, NP  hydrocerin (EUCERIN) CREA Apply 1 application topically 2 (two) times daily. For dry skin 02/16/15  Yes Sanjuana Kava, NP  hydrOXYzine (ATARAX/VISTARIL) 50 MG tablet Take 1 tablet (50 mg total) by mouth at bedtime as needed for anxiety (insomnia). 02/16/15  Yes Sanjuana Kava, NP  ibuprofen (ADVIL,MOTRIN) 800 MG tablet Take 1 tablet (800 mg total) by mouth 3 (three) times daily. 03/19/15  Yes Tommi Rumps, PA-C  OLANZapine (ZYPREXA) 10 MG tablet Take 1 tablet (10 mg total) by mouth at bedtime. For mood control 02/16/15  Yes Sanjuana Kava, NP  traZODone (DESYREL) 150 MG tablet Take 1 tablet (150 mg total) by mouth at bedtime. 02/16/15  Yes Sanjuana Kava, NP   BP 117/70 mmHg  Pulse 83  Temp(Src) 97.6 F (36.4 C) (Oral)  Resp 15  SpO2 91% Physical Exam  Constitutional: He appears well-developed and well-nourished.  HENT:  Head: Normocephalic.  Eyes: Pupils are equal, round, and reactive to light.  Neck: Normal range of motion.  Cardiovascular: Normal rate and regular rhythm.   Pulmonary/Chest: Effort normal and breath sounds  normal.  Musculoskeletal: Normal range of motion.  Neurological: He is alert.  Skin: Skin is warm and dry.  Psychiatric: He has a normal mood and affect. His speech is normal. He expresses inappropriate judgment. He expresses no suicidal ideation. He expresses no suicidal plans.  Nursing note and vitals reviewed.   ED Course  Procedures (including critical care time) Labs Review Labs Reviewed - No data to display  Imaging Review No results found. I have personally reviewed and evaluated these  images and lab results as part of my medical decision-making.   EKG Interpretation None     Per care paln pateint will be observed and DC home when clinically sober  patine thas been sleeping in hall way stretcher and observed No distress  Patient ambulatory MDM   Final diagnoses:  Alcohol intoxication, uncomplicated (HCC)         Earley FavorGail Davy [place], NP 04/17/15 0447  Earley FavorGail Kaida Games, NP 04/17/15 0559  Dione Boozeavid Glick, MD 04/17/15 [phone]

## 2015-04-17 NOTE — ED Notes (Signed)
Pt presents w/ [employer] d/t ETOH intoxication.  Pt ambulatory in triage, gait mildly unsteady.

## 2015-04-17 NOTE — ED Notes (Signed)
Pt awake at this time---- ambulated well to the bathroom, gait and balance steady.

## 2015-04-17 NOTE — Discharge Instructions (Signed)
Alcohol Intoxication °Alcohol intoxication occurs when the amount of alcohol that a person has consumed impairs his or her ability to mentally and physically function. Alcohol directly impairs the normal chemical activity of the brain. Drinking large amounts of alcohol can lead to changes in mental function and behavior, and it can cause many physical effects that can be harmful.  °Alcohol intoxication can range in severity from mild to very severe. Various factors can affect the level of intoxication that occurs, such as the person's age, gender, weight, frequency of alcohol consumption, and the presence of other medical conditions (such as diabetes, seizures, or heart conditions). Dangerous levels of alcohol intoxication may occur when people drink large amounts of alcohol in a short period (binge drinking). Alcohol can also be especially dangerous when combined with certain prescription medicines or "recreational" drugs. °SIGNS AND SYMPTOMS °Some common signs and symptoms of mild alcohol intoxication include: °· Loss of coordination. °· Changes in mood and behavior. °· Impaired judgment. °· Slurred speech. °As alcohol intoxication progresses to more severe levels, other signs and symptoms will appear. These may include: °· Vomiting. °· Confusion and impaired memory. °· Slowed breathing. °· Seizures. °· Loss of consciousness. °DIAGNOSIS  °Your health care provider will take a medical history and perform a physical exam. You will be asked about the amount and type of alcohol you have consumed. Blood tests will be done to measure the concentration of alcohol in your blood. In many places, your blood alcohol level must be lower than 80 mg/dL (0.08%) to legally drive. However, many dangerous effects of alcohol can occur at much lower levels.  °TREATMENT  °People with alcohol intoxication often do not require treatment. Most of the effects of alcohol intoxication are temporary, and they go away as the alcohol naturally  leaves the body. Your health care provider will monitor your condition until you are stable enough to go home. Fluids are sometimes given through an IV access tube to help prevent dehydration.  °HOME CARE INSTRUCTIONS °· Do not drive after drinking alcohol. °· Stay hydrated. Drink enough water and fluids to keep your urine clear or pale yellow. Avoid caffeine.   °· Only take over-the-counter or prescription medicines as directed by your health care provider.   °SEEK MEDICAL CARE IF:  °· You have persistent vomiting.   °· You do not feel better after a few days. °· You have frequent alcohol intoxication. Your health care provider can help determine if you should see a substance use treatment counselor. °SEEK IMMEDIATE MEDICAL CARE IF:  °· You become shaky or tremble when you try to stop drinking.   °· You shake uncontrollably (seizure).   °· You throw up (vomit) blood. This may be bright red or may look like black coffee grounds.   °· You have blood in your stool. This may be bright red or may appear as a black, tarry, bad smelling stool.   °· You become lightheaded or faint.   °MAKE SURE YOU:  °· Understand these instructions. °· Will watch your condition. °· Will get help right away if you are not doing well or get worse. °  °This information is not intended to replace advice given to you by your health care provider. Make sure you discuss any questions you have with your health care provider. °  °Document Released: 03/22/2005 Document Revised: 02/12/2013 Document Reviewed: 11/15/2012 °Elsevier Interactive Patient Education ©2016 Elsevier Inc. ° ° °Emergency Department Resource Guide °1) Find a Doctor and Pay Out of Pocket °Although you won't have to find out   who is covered by your insurance plan, it is a good idea to ask around and get recommendations. You will then need to call the office and see if the doctor you have chosen will accept you as a new patient and what types of options they offer for patients who  are self-pay. Some doctors offer discounts or will set up payment plans for their patients who do not have insurance, but you will need to ask so you aren't surprised when you get to your appointment. ° °2) Contact Your Local Health Department °Not all health departments have doctors that can see patients for sick visits, but many do, so it is worth a call to see if yours does. If you don't know where your local health department is, you can check in your phone book. The CDC also has a tool to help you locate your state's health department, and many state websites also have listings of all of their local health departments. ° °3) Find a Walk-in Clinic °If your illness is not likely to be very severe or complicated, you may want to try a walk in clinic. These are popping up all over the country in pharmacies, drugstores, and shopping centers. They're usually staffed by nurse practitioners or physician assistants that have been trained to treat common illnesses and complaints. They're usually fairly quick and inexpensive. However, if you have serious medical issues or chronic medical problems, these are probably not your best option. ° °No Primary Care Doctor: °- Call [facility] at  832-8000 - they can help you locate a primary care doctor that  accepts your insurance, provides certain services, etc. °- Physician Referral Service- 1-800-533-3463 ° °Chronic Pain Problems: °Organization         Address  Phone   Notes  °[facility]  (336) 297-2271 Patients need to be referred by their primary care doctor.  ° °Medication Assistance: °Organization         Address  Phone   Notes  °[place] [facility] [address] °[place], [place] [address] (336) 641-8030 --Must be a resident of [place] °-- Must have NO insurance coverage whatsoever (no Medicaid/ Medicare, etc.) °-- The pt. MUST have a primary care doctor that directs their care regularly and follows  them in the community °  °[facility]  (866) 331-1348   °[employer]  (888) 892-1162   ° °Agencies that provide inexpensive medical care: °Organization         Address  Phone   Notes  °[facility]  (336) 832-8035   °[facility]    (336) 832-7272   °[facility] [address] °[place], [place] [address] (336) 832-4777   °[facility] [address], °[place] (336) 271-4999   °[facility]    (336) 373-0678   °[facility]    (336) 272-1050   °[facility] ° [address], [place] Phone:  (336) 832-4444, Fax:  (336) 832-4440 Hours of Operation:  9 am - 6 pm, M-F.  Also accepts Medicaid/Medicare and self-pay.  ° [facility] ° [address],  Phone: (336) 832-3150, Fax: (336) 832-3151. Hours of Operation:  8:30 am - 5:30 pm, M-F.  Also accepts Medicaid and self-pay.  °[facility] [place] [address], [place] Phone: (336) 878-6027   °[facility] [address], [place], [place] (  336)723-1848, Ext. 123 Mondays & Thursdays: 7-9 AM.  First 15 patients are seen on a first come, first serve basis. °  ° °Medicaid-accepting [place] Providers: ° °Organization         Address  Phone   Notes  °[facility] [address] [place] (336) 641-2100 Also accepts self-pay patients.  °[facility] [address], [place] ° (336) 856-9996   °[facility] [address], [place] (336) 288-8857   °[facility] [address] [place] [address], [place] (336) 299-7000   °Veita Bland [address], [place]  ° (336) 373-1557 Only accepts [place] Access Medicaid patients after they have their name applied to their card.  ° °Self-Pay (no insurance) in [place]: ° °Organization         Address  Phone   Notes  °Sickle Cell  Patients, [facility] [address], [place] (336) 832-1970   °[facility] [address], [place] (336) 832-4400   ° [facility] ° [address], [place] (336) 992-4800   °[facility]/Dr. Osei-Bonsu ° [address] [place] [address], [place] or [address], [place] (336) 841-8500 Phone number for both [place] and [place] locations is the same.  °[facility] [address], [place] (336) 299-0000   °[facility] [address] [place] [address], [place] or [address] (336) 852-7530 °(336) 878-2260   °[facility] [address], [place] (336) 350-1642, phone; (336) 294-5005, fax Sees patients 1st and 3rd Saturday of every month.  Must not qualify for public or private insurance (i.e. Medicaid, Medicare, [place] Health Choice, Veterans' Benefits) • Household income should be no more than 200% of the poverty level •The clinic cannot treat you if you are pregnant or think you are pregnant • Sexually transmitted diseases are not treated at the clinic.  ° ° °Dental Care: °Organization         Address  Phone  Notes  °[facility] [facility] [address], [place] (336) 641-6152 Accepts children up to age 21 who are enrolled in Medicaid or [place] Health Choice; pregnant women with a Medicaid card; and children who have applied for Medicaid or [place] Health Choice, but were declined, whose parents can pay a reduced fee at time of service.  °[facility] [place]  [address], [place] (336) 641-7733 Accepts children up to age 21 who are enrolled in Medicaid or [place] Health Choice; pregnant women with a Medicaid card; and children who have applied for Medicaid or [place] Health Choice, but were declined, whose parents can pay a reduced fee at time of service.  °[facility] ° [address], [place] (336) 641-4533 Patients are seen by appointment only. Walk-ins are not accepted. [facility] will see patients 18 years of age and older. °Monday - Tuesday (8am-5pm) °Most Wednesdays (8:30-5pm) °$30 per visit, cash only  °[facility] ° [address], [place] (336) 641-4533 Patients are seen by appointment only. Walk-ins are not accepted. [facility] will see patients 18 years of age and older. °One Wednesday Evening (Monthly: Volunteer Based).  $30 per visit, cash only  °[facility]  (919)   537-3737 for adults; Children under age 4, call Graduate Pediatric Dentistry at (919) [phone]. Children aged 4-14, please call (919) [phone] to request a pediatric application. ° Dental services are provided in all areas of dental care including fillings, crowns and bridges, complete and partial dentures, implants, gum treatment, root canals, and extractions. Preventive care is also provided. Treatment is provided to both adults and children. °Patients are selected via a lottery and there is often a waiting list. °  °[facility] [address], °[place] ° (336) 763-8833 [email or web address] °  °[facility] [address], [place], [place] (336)723-1848, Ext. 123 Second and Fourth Thursday of each month, opens at 6:30 AM; Clinic ends at 9 AM.  Patients are seen on a first-come first-served basis, and a limited number are seen during each clinic.  ° °[facility] ° [address], [place], [place] (336) 723-7904   Eligibility Requirements °You must have lived in [place], [place], or [place] counties for at least the last three months. °  You cannot be eligible for state or federal sponsored healthcare insurance, including Veterans Administration, Medicaid, or Medicare. °  You generally cannot be eligible for healthcare insurance through your employer.  °  How to apply: °Eligibility  screenings are held every Tuesday and Wednesday afternoon from 1:00 pm until 4:00 pm. You do not need an appointment for the interview!  °[facility] [address], [place], [place] 336-631-2330   °[facility]  336-342-8273   °[facility]  336-703-3100   °[facility]  336-570-6415   ° °Behavioral Health Resources in the Community: °Intensive Outpatient Programs °Organization         Address  Phone  Notes  °[facility] [address], [place], [place] 336-878-6098   °[facility] [address], [place], [place] 336-832-9800   °[facility] [address], [place], [place] ° 336-882-2125   °[facility] [address],  °[place], [place] 1-800-853-5163 or 336-641-4981   °Substance Abuse Resources °Organization         Address  Phone  Notes  °[facility]  336-882-2125   °[facility]  336-784-9470   °[facility]  336-285-9073   °[facility]  336-845-3988   °Residential & Outpatient Substance Abuse Program  1-800-659-3381   °Psychological Services °Organization         Address  Phone  Notes  °[facility]  336- 832-9600   °[facility]  336- 378-7881   °[facility] [address], [place] 1-800-853-5163 or 336-641-4981   ° °Mobile Crisis Teams °Organization         Address  Phone  Notes  °[facility], Mobile Crisis Care Unit  1-877-626-1772   °Assertive °[facility] ° [address] [place], [place] 336-834-9664   °Sharon DeEsch [address] °[place] 336-554-5454   ° °Self-Help/Support Groups °Organization         Address  Phone             Notes  °[facility] [place] - variety of support groups  336- 373-1402 Call for more information  °Narcotics Anonymous (NA), [facility] [address], °[place]   2 meetings at  this location  ° °Residential Treatment Programs °Organization         Address  Phone  Notes  °  [facility] [address],    °[place]  1-866-801-8205   °[facility] ° [address], [place], [place] 704-293-8524   °[facility] [address], [place] 336-845-3988 Admissions: 8am-3pm M-F  °[facility] [address]    °[place], [place] 336-841-1104   °[facility] [address], [place], [place] 336-379-7146   °[facility] [address]  °[place], [place] 336-285-9073   °[facility] Intensive Outpatient [address], [place], [place] 336-852-3033   °[facility] [address]  °[place], [place] 1-877-615-2722 or 336-784-9470   °[facility] [address] [place], [place] 336-227-7417 Accepts Medicaid  °[facility] [address]  ° [place] 1-800-659-3381 Substance Abuse/Addiction Treatment  ° °[place] Behavioral Health Resources °Organization         Address  Phone  Notes  °[facility]  (888) 581-9988   °Julie Brannon, PhD [address] [place], [place]   (336) 349-5553 or (336) 951-0000   °[facility]   [address] °[place], [place] (336) 349-4454   °[facility] [address], [place], [place] (336) 342-8316 Insurance/Medicaid/sponsorship through [facility]  °[facility] [address]                                    [place], [place] (336) 342-8316 Therapy/tele-psych/case  °[facility] [address]  ° [place], [place] (336) 349-2233    °Dr. Arfeen  (336) 349-4544   °[facility] [place]  [employer] [place] [facility] 1) [address], [place] °2) [address], [place] °3)  [address], [place] (336) 349-3220 °(336) 342-7768 ° °(336) 342-8140   °[place] [facility] (336) 342-1394 or (336)  342-3537 (After Hours)    ° ° ° °

## 2015-04-17 NOTE — ED Notes (Signed)
Pt [place] [facility]--- gait and balance a little unsteady.

## 2015-04-17 NOTE — ED Notes (Signed)
Patient seen in no distress. Pt is sleeping quietly on stretcher.

## 2015-05-08 ENCOUNTER — Emergency Department (HOSPITAL_COMMUNITY)
Admission: EM | Admit: 2015-05-08 | Discharge: 2015-05-08 | Disposition: A | Discharge status: Home / Self Care | Payer: Self-pay | Attending: Emergency Medicine | Admitting: Emergency Medicine

## 2015-05-08 ENCOUNTER — Encounter (HOSPITAL_COMMUNITY): Payer: Self-pay | Admitting: Emergency Medicine

## 2015-05-08 DIAGNOSIS — Z720 Tobacco use: Secondary | ICD-10-CM | POA: Insufficient documentation

## 2015-05-08 DIAGNOSIS — F10920 Alcohol use, unspecified with intoxication, uncomplicated: Secondary | ICD-10-CM

## 2015-05-08 DIAGNOSIS — F319 Bipolar disorder, unspecified: Secondary | ICD-10-CM | POA: Insufficient documentation

## 2015-05-08 DIAGNOSIS — F10120 Alcohol abuse with intoxication, uncomplicated: Secondary | ICD-10-CM | POA: Insufficient documentation

## 2015-05-08 DIAGNOSIS — Z79899 Other long term (current) drug therapy: Secondary | ICD-10-CM | POA: Insufficient documentation

## 2015-05-08 DIAGNOSIS — Z791 Long term (current) use of non-steroidal anti-inflammatories (NSAID): Secondary | ICD-10-CM | POA: Insufficient documentation

## 2015-05-08 NOTE — ED Provider Notes (Signed)
Patient signed out to me at shift change. Pt intoxicated in ED. Pt with ED care plan. Will monitor pt in ED. Anticipate discharge home once pt appears clinically sober.   Lester KinsmanSamantha Tripp [place], PA-C 05/08/15 1658  Pricilla LovelessScott [place]ston, MD 05/11/15 [phone]

## 2015-05-08 NOTE — ED Provider Notes (Signed)
CSN: [id number]     Arrival date & time 05/08/15  1208 History   First MD Initiated Contact with Patient 05/08/15 1237     Chief Complaint  Patient presents with  . Alcohol Intoxication    HPI   Level 5 caveat due to intoxication  44 year old male presents via EMS for alcohol intoxication. Patient admits drinking vodka and Listerine today, was noted to have slurred speech and steady gait here in the ED. He appears to be in no acute distress. Patient has been seen 15 times in last 6 months for similar presentations with active care plan  Past Medical History  Diagnosis Date  . Alcohol abuse   . Depression   . Bipolar 1 disorder, depressed (HCC)    Past Surgical History  Procedure Laterality Date  . No past surgeries     Family History  Problem Relation Age of Onset  . Hypertension Mother   . Cancer Father   . Heart failure Father   . Alcoholism Father    Social History  Substance Use Topics  . Smoking status: Current Every Day Smoker  . Smokeless tobacco: None  . Alcohol Use: Yes     Comment: daily - "whatever I can get" - 1/5 plus daily    Review of Systems  All other systems reviewed and are negative.   Allergies  Review of patient's allergies indicates no known allergies.  Home Medications   Prior to Admission medications   Medication Sig Start Date End Date Taking? Authorizing Provider  acamprosate (CAMPRAL) 333 MG tablet Take 2 tablets (666 mg total) by mouth 3 (three) times daily with meals. For alcoholism, 02/16/15  Yes Sanjuana KavaAgnes I Nwoko, NP  FLUoxetine (PROZAC) 20 MG capsule Take 1 capsule (20 mg total) by mouth daily. For depression 02/16/15  Yes Sanjuana KavaAgnes I Nwoko, NP  gabapentin (NEURONTIN) 300 MG capsule Take 1 capsule (300 mg total) by mouth 3 (three) times daily. For agitation 02/16/15  Yes Sanjuana KavaAgnes I Nwoko, NP  hydrOXYzine (ATARAX/VISTARIL) 50 MG tablet Take 1 tablet (50 mg total) by mouth at bedtime as needed for anxiety (insomnia). 02/16/15  Yes Sanjuana KavaAgnes I Nwoko, NP   ibuprofen (ADVIL,MOTRIN) 800 MG tablet Take 1 tablet (800 mg total) by mouth 3 (three) times daily. 03/19/15  Yes Tommi Rumpshonda L Summers, PA-C  OLANZapine (ZYPREXA) 10 MG tablet Take 1 tablet (10 mg total) by mouth at bedtime. For mood control 02/16/15  Yes Sanjuana KavaAgnes I Nwoko, NP  traZODone (DESYREL) 150 MG tablet Take 1 tablet (150 mg total) by mouth at bedtime. 02/16/15  Yes Sanjuana KavaAgnes I Nwoko, NP  hydrocerin (EUCERIN) CREA Apply 1 application topically 2 (two) times daily. For dry skin Patient not taking: Reported on 05/08/2015 02/16/15   Sanjuana KavaAgnes I Nwoko, NP   BP 122/75 mmHg  Pulse 101  Temp(Src) 98 F (36.7 C) (Oral)  Resp 20  SpO2 95%   Physical Exam  Constitutional: He is oriented to person, place, and time. He appears well-developed and well-nourished.  Intoxicated  HENT:  Head: Normocephalic and atraumatic.  Eyes: Conjunctivae are normal. Pupils are equal, round, and reactive to light. Right eye exhibits no discharge. Left eye exhibits no discharge. No scleral icterus.  Neck: Normal range of motion. No JVD present. No tracheal deviation present.  Pulmonary/Chest: Effort normal. No stridor.  Neurological: He is alert and oriented to person, place, and time. Coordination normal.  Psychiatric: He has a normal mood and affect. His behavior is normal. Judgment and thought content normal.  Nursing note and vitals reviewed.    ED Course  Procedures (including critical care time) Labs Review Labs Reviewed - No data to display  Imaging Review No results found. I have personally reviewed and evaluated these images and lab results as part of my medical decision-making.   EKG Interpretation None      MDM   Final diagnoses:  Alcohol intoxication, uncomplicated (HCC)    Labs:  Imaging:  Consults:  Therapeutics:  Discharge Meds:   Assessment/Plan: Patient presents with alcohol intoxication, he appears to be in no acute distress. Patient will be allowed to sleep. The ED and discharged  home when he is clinically sober. Patient care will be transferred over to oncoming provider for repeat evaluation and likely discharge.         Eyvonne Mechanic, PA-C 05/08/15 1638  Leta Baptist, MD 05/09/15 [phone]

## 2015-05-08 NOTE — Discharge Instructions (Signed)
Alcohol Abuse and Nutrition °Alcohol abuse is any pattern of alcohol consumption that harms your health, relationships, or work. Alcohol abuse can affect how your body breaks down and absorbs nutrients from food by causing your liver to work abnormally. Additionally, many people who abuse alcohol do not eat enough carbohydrates, protein, fat, vitamins, and minerals. This can cause poor nutrition (malnutrition) and a lack of nutrients (nutrient deficiencies), which can lead to further complications. °Nutrients that are commonly lacking (deficient) among people who abuse alcohol include: °· Vitamins. °· Vitamin A. This is stored in your liver. It is important for your vision, metabolism, and ability to fight off infections (immunity). °· B vitamins. These include vitamins such as folate, thiamin, and niacin. These are important in new cell growth and maintenance. °· Vitamin C. This plays an important role in iron absorption, wound healing, and immunity. °· Vitamin D. This is produced by your liver, but you can also get vitamin D from food. Vitamin D is necessary for your body to absorb and use calcium. °· Minerals. °· Calcium. This is important for your bones and your heart and blood vessel (cardiovascular) function. °· Iron. This is important for blood, muscle, and nervous system functioning. °· Magnesium. This plays an important role in muscle and nerve function, and it helps to control blood sugar and blood pressure. °· Zinc. This is important for the normal function of your nervous system and digestive system (gastrointestinal tract). °Nutrition is an essential component of therapy for alcohol abuse. Your health care provider or dietitian will work with you to design a plan that can help restore nutrients to your body and prevent potential complications. °WHAT IS MY PLAN? °Your dietitian may develop a specific diet plan that is based on your condition and any other complications you may have. A diet plan will  commonly include: °· A balanced diet. °¨ Grains: 6-8 oz per day. °¨ Vegetables: 2-3 cups per day. °¨ Fruits: 1-2 cups per day. °¨ Meat and other protein: 5-6 oz per day. °¨ Dairy: 2-3 cups per day. °· Vitamin and mineral supplements. °WHAT DO I NEED TO KNOW ABOUT ALCOHOL AND NUTRITION? °· Consume foods that are high in antioxidants, such as grapes, berries, nuts, green tea, and dark green and orange vegetables. This can help to counteract some of the stress that is placed on your liver by consuming alcohol. °· Avoid food and drinks that are high in fat and sugar. Foods such as sugared soft drinks, salty snack foods, and candy contain empty calories. This means that they lack important nutrients such as protein, fiber, and vitamins. °· Eat frequent meals and snacks. Try to eat 5-6 small meals each day. °· Eat a variety of fresh fruits and vegetables each day. This will help you get plenty of water, fiber, and vitamins in your diet. °· Drink plenty of water and other clear fluids. Try to drink at least 48-64 oz (1.5-2 L) of water per day. °· If you are a vegetarian, eat a variety of protein-rich foods. Pair whole grains with plant-based proteins at meals and snacks to obtain the greatest nutrient benefit from your food. For example, eat rice with beans, put peanut butter on whole-grain toast, or eat oatmeal with sunflower seeds. °· Soak beans and whole grains overnight before cooking. This can help your body to absorb the nutrients more easily. °· Include foods fortified with vitamins and minerals in your diet. Commonly fortified foods include milk, orange juice, cereal, and bread. °· If you   are malnourished, your dietitian may recommend a high-protein, high-calorie diet. This may include:  2,000-3,000 calories (kilocalories) per day.  70-100 grams of protein per day.  Your health care provider may recommend a complete nutritional supplement beverage. This can help to restore calories, protein, and vitamins to  your body. Depending on your condition, you may be advised to consume this instead of or in addition to meals.  Limit your intake of caffeine. Replace drinks like coffee and black tea with decaffeinated coffee and herbal tea.  Eat a variety of foods that are high in omega fatty acids. These include fish, nuts and seeds, and soybeans. These foods may help your liver to recover and may also stabilize your mood.  Certain medicines may cause changes in your appetite, taste, and weight. Work with your health care provider and dietitian to make any adjustments to your medicines and diet plan.  Include other healthy lifestyle choices in your daily routine.  Be physically active.  Get enough sleep.  Spend time doing activities that you enjoy.  If you are unable to take in enough food and calories by mouth, your health care provider may recommend a feeding tube. This is a tube that passes through your nose and throat, directly into your stomach. Nutritional supplement beverages can be given to you through the feeding tube to help you get the nutrients you need.  Take vitamin or mineral supplements as recommended by your health care provider. WHAT FOODS CAN I EAT? Grains Enriched pasta. Enriched rice. Fortified whole-grain bread. Fortified whole-grain cereal. Barley. Brown rice. Quinoa. Erwin. Vegetables All fresh, frozen, and canned vegetables. Spinach. Kale. Artichoke. Carrots. Winter squash and pumpkin. Sweet potatoes. Broccoli. Cabbage. Cucumbers. Tomatoes. Sweet peppers. Green beans. Peas. Corn. Fruits All fresh and frozen fruits. Berries. Grapes. Mango. Papaya. Guava. Cherries. Apples. Bananas. Peaches. Plums. Pineapple. Watermelon. Cantaloupe. Oranges. Avocado. Meats and Other Protein Sources Beef liver. Lean beef. Pork. Fresh and canned chicken. Fresh fish. Oysters. Sardines. Canned tuna. Shrimp. Eggs with yolks. Nuts and seeds. Peanut butter. Beans and lentils. Soybeans.  Tofu. Dairy Whole, low-fat, and nonfat milk. Whole, low-fat, and nonfat yogurt. Cottage cheese. Sour cream. Hard and soft cheeses. Beverages Water. Herbal tea. Decaffeinated coffee. Decaffeinated green tea. 100% fruit juice. 100% vegetable juice. Instant breakfast shakes. Condiments Ketchup. Mayonnaise. Mustard. Salad dressing. Barbecue sauce. Sweets and Desserts Sugar-free ice cream. Sugar-free pudding. Sugar-free gelatin. Fats and Oils Butter. Vegetable oil, flaxseed oil, olive oil, and walnut oil. Other Complete nutrition shakes. Protein bars. Sugar-free gum. The items listed above may not be a complete list of recommended foods or beverages. Contact your dietitian for more options. WHAT FOODS ARE NOT RECOMMENDED? Grains Sugar-sweetened breakfast cereals. Flavored instant oatmeal. Fried breads. Vegetables Breaded or deep-fried vegetables. Fruits Dried fruit with added sugar. Candied fruit. Canned fruit in syrup. Meats and Other Protein Sources Breaded or deep-fried meats. Dairy Flavored milks. Fried cheese curds or fried cheese sticks. Beverages Alcohol. Sugar-sweetened soft drinks. Sugar-sweetened tea. Caffeinated coffee and tea. Condiments Sugar. Honey. Agave nectar. Molasses. Sweets and Desserts Chocolate. Cake. Cookies. Candy. Other Potato chips. Pretzels. Salted nuts. Candied nuts. The items listed above may not be a complete list of foods and beverages to avoid. Contact your dietitian for more information.   This information is not intended to replace advice given to you by your health care provider. Make sure you discuss any questions you have with your health care provider.   Document Released: 04/06/2005 Document Revised: 07/03/2014 Document Reviewed: 01/13/2014 Elsevier Interactive Patient  Education 2016 [employer].  Alcohol Intoxication Alcohol intoxication occurs when you drink enough alcohol that it affects your ability to function. It can be mild or very  severe. Drinking a lot of alcohol in a short time is called binge drinking. This can be very harmful. Drinking alcohol can also be more dangerous if you are taking medicines or other drugs. Some of the effects caused by alcohol may include:  Loss of coordination.  Changes in mood and behavior.  Unclear thinking.  Trouble talking (slurred speech).  Throwing up (vomiting).  Confusion.  Slowed breathing.  Twitching and shaking (seizures).  Loss of consciousness. HOME CARE  Do not drive after drinking alcohol.  Drink enough water and fluids to keep your pee (urine) clear or pale yellow. Avoid caffeine.  Only take medicine as told by your doctor. GET HELP IF:  You throw up (vomit) many times.  You do not feel better after a few days.  You frequently have alcohol intoxication. Your doctor can help decide if you should see a substance use treatment counselor. GET HELP RIGHT AWAY IF:  You become shaky when you stop drinking.  You have twitching and shaking.  You throw up blood. It may look bright red or like coffee grounds.  You notice blood in your poop (bowel movements).  You become lightheaded or pass out (faint). MAKE SURE YOU:   Understand these instructions.  Will watch your condition.  Will get help right away if you are not doing well or get worse.   This information is not intended to replace advice given to you by your health care provider. Make sure you discuss any questions you have with your health care provider.   Document Released: 11/29/2007 Document Revised: 02/12/2013 Document Reviewed: 11/15/2012 Elsevier Interactive Patient Education 2016 [employer].  Alcohol Use Disorder Alcohol use disorder is a mental disorder. It is not a one-time incident of heavy drinking. Alcohol use disorder is the excessive and uncontrollable use of alcohol over time that leads to problems with functioning in one or more areas of daily living. People with this disorder  risk harming themselves and others when they drink to excess. Alcohol use disorder also can cause other mental disorders, such as mood and anxiety disorders, and serious physical problems. People with alcohol use disorder often misuse other drugs.  Alcohol use disorder is common and widespread. Some people with this disorder drink alcohol to cope with or escape from negative life events. Others drink to relieve chronic pain or symptoms of mental illness. People with a family history of alcohol use disorder are at higher risk of losing control and using alcohol to excess.  Drinking too much alcohol can cause injury, accidents, and health problems. One drink can be too much when you are:  Working.  Pregnant or breastfeeding.  Taking medicines. Ask your doctor.  Driving or planning to drive. SYMPTOMS  Signs and symptoms of alcohol use disorder may include the following:   Consumption ofalcohol inlarger amounts or over a longer period of time than intended.  Multiple unsuccessful attempts to cutdown or control alcohol use.   A great deal of time spent obtaining alcohol, using alcohol, or recovering from the effects of alcohol (hangover).  A strong desire or urge to use alcohol (cravings).   Continued use of alcohol despite problems at work, school, or home because of alcohol use.   Continued use of alcohol despite problems in relationships because of alcohol use.  Continued use of alcohol in  situations when it is physically hazardous, such as driving a car.  Continued use of alcohol despite awareness of a physical or psychological problem that is likely related to alcohol use. Physical problems related to alcohol use can involve the brain, heart, liver, stomach, and intestines. Psychological problems related to alcohol use include intoxication, depression, anxiety, psychosis, delirium, and dementia.   The need for increased amounts of alcohol to achieve the same desired effect, or a  decreased effect from the consumption of the same amount of alcohol (tolerance).  Withdrawal symptoms upon reducing or stopping alcohol use, or alcohol use to reduce or avoid withdrawal symptoms. Withdrawal symptoms include:  Racing heart.  Hand tremor.  Difficulty sleeping.  Nausea.  Vomiting.  Hallucinations.  Restlessness.  Seizures. DIAGNOSIS Alcohol use disorder is diagnosed through an assessment by your health care provider. Your health care provider may start by asking three or four questions to screen for excessive or problematic alcohol use. To confirm a diagnosis of alcohol use disorder, at least two symptoms must be present within a 6838-month period. The severity of alcohol use disorder depends on the number of symptoms:  Mild--two or three.  Moderate--four or five.  Severe--six or more. Your health care provider may perform a physical exam or use results from lab tests to see if you have physical problems resulting from alcohol use. Your health care provider may refer you to a mental health professional for evaluation. TREATMENT  Some people with alcohol use disorder are able to reduce their alcohol use to low-risk levels. Some people with alcohol use disorder need to quit drinking alcohol. When necessary, mental health professionals with specialized training in substance use treatment can help. Your health care provider can help you decide how severe your alcohol use disorder is and what type of treatment you need. The following forms of treatment are available:   Detoxification. Detoxification involves the use of prescription medicines to prevent alcohol withdrawal symptoms in the first week after quitting. This is important for people with a history of symptoms of withdrawal and for heavy drinkers who are likely to have withdrawal symptoms. Alcohol withdrawal can be dangerous and, in severe cases, cause death. Detoxification is usually provided in a hospital or in-patient  substance use treatment facility.  Counseling or talk therapy. Talk therapy is provided by substance use treatment counselors. It addresses the reasons people use alcohol and ways to keep them from drinking again. The goals of talk therapy are to help people with alcohol use disorder find healthy activities and ways to cope with life stress, to identify and avoid triggers for alcohol use, and to handle cravings, which can cause relapse.  Medicines.Different medicines can help treat alcohol use disorder through the following actions:  Decrease alcohol cravings.  Decrease the positive reward response felt from alcohol use.  Produce an uncomfortable physical reaction when alcohol is used (aversion therapy).  Support groups. Support groups are run by people who have quit drinking. They provide emotional support, advice, and guidance. These forms of treatment are often combined. Some people with alcohol use disorder benefit from intensive combination treatment provided by specialized substance use treatment centers. Both inpatient and outpatient treatment programs are available.   This information is not intended to replace advice given to you by your health care provider. Make sure you discuss any questions you have with your health care provider.   Follow up with PCP. Avoid consumption of alcohol. See above for symptoms that would warrant return to the ED.

## 2015-05-08 NOTE — ED Notes (Signed)
Pt in via EMS- Per EMS, pt was found passed outside of a business taking a nap. Pt admits to drinking vodka and listerine today. Pt intoxicated with slurred speech but is ambulatory with steady gait. Pt is calm and cooperative. Pt is A&O and in NAD

## 2015-05-08 NOTE — ED Notes (Signed)
Pt ambulated to BR and back to hall bed without assistance and walked with steady gait

## 2015-07-22 ENCOUNTER — Emergency Department (HOSPITAL_COMMUNITY)
Admission: EM | Admit: 2015-07-22 | Discharge: 2015-07-22 | Discharge status: Against Medical Advice | Payer: Self-pay | Attending: Emergency Medicine | Admitting: Emergency Medicine

## 2015-07-22 DIAGNOSIS — F17200 Nicotine dependence, unspecified, uncomplicated: Secondary | ICD-10-CM | POA: Insufficient documentation

## 2015-07-22 DIAGNOSIS — F10929 Alcohol use, unspecified with intoxication, unspecified: Secondary | ICD-10-CM

## 2015-07-22 DIAGNOSIS — F10120 Alcohol abuse with intoxication, uncomplicated: Secondary | ICD-10-CM | POA: Insufficient documentation

## 2015-07-22 DIAGNOSIS — Z791 Long term (current) use of non-steroidal anti-inflammatories (NSAID): Secondary | ICD-10-CM | POA: Insufficient documentation

## 2015-07-22 DIAGNOSIS — F319 Bipolar disorder, unspecified: Secondary | ICD-10-CM | POA: Insufficient documentation

## 2015-07-22 DIAGNOSIS — Z79899 Other long term (current) drug therapy: Secondary | ICD-10-CM | POA: Insufficient documentation

## 2015-07-22 NOTE — ED Notes (Signed)
Bed: [facility] Expected date:  Expected time:  Means of arrival:  Comments: EMS- ETOH intoxication

## 2015-07-22 NOTE — ED Provider Notes (Signed)
CSN: [id number]     Arrival date & time 07/22/15  1913 History   First MD Initiated Contact with Patient 07/22/15 1925     Chief Complaint  Patient presents with  . Alcohol Intoxication     (Consider location/radiation/quality/duration/timing/severity/associated sxs/prior Treatment) Patient is a 45 y.o. male presenting with intoxication. The history is provided by the patient. No language interpreter was used.  Alcohol Intoxication This is a recurrent problem. The current episode started today. The problem occurs daily. The problem has been unchanged. Pertinent negatives include no abdominal pain. He has tried nothing for the symptoms. The treatment provided no relief.   Pt here with EMS and Police.  Pt is reported to have been drinking all day and a store owner called EMS due to intoxicated behavior.  Pt has been here multiple times and has a care plan.   Past Medical History  Diagnosis Date  . Alcohol abuse   . Depression   . Bipolar 1 disorder, depressed (HCC)    Past Surgical History  Procedure Laterality Date  . No past surgeries     Family History  Problem Relation Age of Onset  . Hypertension Mother   . Cancer Father   . Heart failure Father   . Alcoholism Father    Social History  Substance Use Topics  . Smoking status: Current Every Day Smoker  . Smokeless tobacco: Not on file  . Alcohol Use: Yes     Comment: daily - "whatever I can get" - 1/5 plus daily    Review of Systems  Gastrointestinal: Negative for abdominal pain.  All other systems reviewed and are negative.     Allergies  Review of patient's allergies indicates no known allergies.  Home Medications   Prior to Admission medications   Medication Sig Start Date End Date Taking? Authorizing Provider  acamprosate (CAMPRAL) 333 MG tablet Take 2 tablets (666 mg total) by mouth 3 (three) times daily with meals. For alcoholism, 02/16/15   Sanjuana Kava, NP  FLUoxetine (PROZAC) 20 MG capsule Take 1  capsule (20 mg total) by mouth daily. For depression 02/16/15   Sanjuana Kava, NP  gabapentin (NEURONTIN) 300 MG capsule Take 1 capsule (300 mg total) by mouth 3 (three) times daily. For agitation 02/16/15   Sanjuana Kava, NP  hydrocerin (EUCERIN) CREA Apply 1 application topically 2 (two) times daily. For dry skin Patient not taking: Reported on 05/08/2015 02/16/15   Sanjuana Kava, NP  hydrOXYzine (ATARAX/VISTARIL) 50 MG tablet Take 1 tablet (50 mg total) by mouth at bedtime as needed for anxiety (insomnia). 02/16/15   Sanjuana Kava, NP  ibuprofen (ADVIL,MOTRIN) 800 MG tablet Take 1 tablet (800 mg total) by mouth 3 (three) times daily. 03/19/15   Tommi Rumps, PA-C  OLANZapine (ZYPREXA) 10 MG tablet Take 1 tablet (10 mg total) by mouth at bedtime. For mood control 02/16/15   Sanjuana Kava, NP  traZODone (DESYREL) 150 MG tablet Take 1 tablet (150 mg total) by mouth at bedtime. 02/16/15   Sanjuana Kava, NP   BP 151/92 mmHg  Pulse 83  Temp(Src) 97.8 F (36.6 C) (Oral)  Resp 16  SpO2 96% Physical Exam  Constitutional: He appears well-developed and well-nourished.  HENT:  Head: Normocephalic.  Right Ear: External ear normal.  Left Ear: External ear normal.  Nose: Nose normal.  Mouth/Throat: Oropharynx is clear and moist.  Eyes: Conjunctivae are normal. Pupils are equal, round, and reactive to light.  Neck:  Normal range of motion. Neck supple.  Cardiovascular: Normal rate and normal heart sounds.   Pulmonary/Chest: Effort normal.  Abdominal: Soft.  Musculoskeletal: Normal range of motion.  Neurological: He is alert.  Skin: Skin is warm.  Psychiatric:  intoxicated  Nursing note and vitals reviewed.   ED Course  Procedures (including critical care time) Labs Review Labs Reviewed - No data to display  Imaging Review No results found. I have personally reviewed and evaluated these images and lab results as part of my medical decision-making.   EKG Interpretation None      MDM   Care plan reviewed.  Pt will be observed until clinically sober and discharged unless he has a change.   Final diagnoses:  Alcohol intoxication, with unspecified complication [facility])        Lonia Skinner [place], PA-C 07/22/15 1957  Raeford Razor, MD 07/23/15 1538

## 2015-07-22 NOTE — ED Notes (Signed)
Patient combative when EMS arrived with him.  Patient responded to verbal stimuli but, can't or won't answer questions.  Patient is not observed to be asleep.  Breathing even and unlabored.  NAD at this time.  Patient has fluid bolus running from EMS.

## 2015-07-22 NOTE — ED Notes (Signed)
Pt verbally and physically agressive with staff; Security and [employer] at bedside; pt able to stand on his own; pt to be arrested and taken to jail per [employer] due to behavior; Dr Juleen China aware

## 2015-07-22 NOTE — ED Notes (Signed)
Pt unable to sign due to being in handcuffs

## 2015-07-22 NOTE — ED Notes (Signed)
Per EMS called to store at the corner of [place].  States patient had been outside the store drinking all day.  Last seen with [facility].  Per EMS responds to verbal stimuli, 18 Lft AC.  Per EMS patient became combative with them unprovoked.  Hx Bi-polar.

## 2015-07-26 ENCOUNTER — Emergency Department (HOSPITAL_COMMUNITY)
Admission: EM | Admit: 2015-07-26 | Discharge: 2015-07-26 | Disposition: A | Discharge status: Home / Self Care | Payer: Self-pay | Attending: Emergency Medicine | Admitting: Emergency Medicine

## 2015-07-26 ENCOUNTER — Encounter (HOSPITAL_COMMUNITY): Payer: Self-pay | Admitting: Emergency Medicine

## 2015-07-26 DIAGNOSIS — F10120 Alcohol abuse with intoxication, uncomplicated: Secondary | ICD-10-CM | POA: Insufficient documentation

## 2015-07-26 DIAGNOSIS — Z79899 Other long term (current) drug therapy: Secondary | ICD-10-CM | POA: Insufficient documentation

## 2015-07-26 DIAGNOSIS — F10920 Alcohol use, unspecified with intoxication, uncomplicated: Secondary | ICD-10-CM

## 2015-07-26 DIAGNOSIS — F319 Bipolar disorder, unspecified: Secondary | ICD-10-CM | POA: Insufficient documentation

## 2015-07-26 DIAGNOSIS — F17200 Nicotine dependence, unspecified, uncomplicated: Secondary | ICD-10-CM | POA: Insufficient documentation

## 2015-07-26 NOTE — Discharge Instructions (Signed)
Alcohol Intoxication °Alcohol intoxication occurs when you drink enough alcohol that it affects your ability to function. It can be mild or very severe. Drinking a lot of alcohol in a short time is called binge drinking. This can be very harmful. Drinking alcohol can also be more dangerous if you are taking medicines or other drugs. Some of the effects caused by alcohol may include: °· Loss of coordination. °· Changes in mood and behavior. °· Unclear thinking. °· Trouble talking (slurred speech). °· Throwing up (vomiting). °· Confusion. °· Slowed breathing. °· Twitching and shaking (seizures). °· Loss of consciousness. °HOME CARE °· Do not drive after drinking alcohol. °· Drink enough water and fluids to keep your pee (urine) clear or pale yellow. Avoid caffeine. °· Only take medicine as told by your doctor. °GET HELP IF: °· You throw up (vomit) many times. °· You do not feel better after a few days. °· You frequently have alcohol intoxication. Your doctor can help decide if you should see a substance use treatment counselor. °GET HELP RIGHT AWAY IF: °· You become shaky when you stop drinking. °· You have twitching and shaking. °· You throw up blood. It may look bright red or like coffee grounds. °· You notice blood in your poop (bowel movements). °· You become lightheaded or pass out (faint). °MAKE SURE YOU:  °· Understand these instructions. °· Will watch your condition. °· Will get help right away if you are not doing well or get worse. °  °This information is not intended to replace advice given to you by your health care provider. Make sure you discuss any questions you have with your health care provider. °  °Document Released: 11/29/2007 Document Revised: 02/12/2013 Document Reviewed: 11/15/2012 °Elsevier Interactive Patient Education ©2016 Elsevier Inc. ° °Emergency Department Resource Guide °1) Find a Doctor and Pay Out of Pocket °Although you won't have to find out who is covered by your insurance plan, it  is a good idea to ask around and get recommendations. You will then need to call the office and see if the doctor you have chosen will accept you as a new patient and what types of options they offer for patients who are self-pay. Some doctors offer discounts or will set up payment plans for their patients who do not have insurance, but you will need to ask so you aren't surprised when you get to your appointment. ° °2) Contact Your Local Health Department °Not all health departments have doctors that can see patients for sick visits, but many do, so it is worth a call to see if yours does. If you don't know where your local health department is, you can check in your phone book. The CDC also has a tool to help you locate your state's health department, and many state websites also have listings of all of their local health departments. ° °3) Find a Walk-in Clinic °If your illness is not likely to be very severe or complicated, you may want to try a walk in clinic. These are popping up all over the country in pharmacies, drugstores, and shopping centers. They're usually staffed by nurse practitioners or physician assistants that have been trained to treat common illnesses and complaints. They're usually fairly quick and inexpensive. However, if you have serious medical issues or chronic medical problems, these are probably not your best option. ° °No Primary Care Doctor: °- Call [facility] at  832-8000 - they can help you locate a primary care doctor that    accepts your insurance, provides certain services, etc. °- Physician Referral Service- 1-800-533-3463 ° °Chronic Pain Problems: °Organization         Address  Phone   Notes  °[facility]  (336) 297-2271 Patients need to be referred by their primary care doctor.  ° °Medication Assistance: °Organization         Address  Phone   Notes  °[place] [facility] [address] °[place], [place] [address] (336)  641-8030 --Must be a resident of [place] °-- Must have NO insurance coverage whatsoever (no Medicaid/ Medicare, etc.) °-- The pt. MUST have a primary care doctor that directs their care regularly and follows them in the community °  °[facility]  (866) 331-1348   °[employer]  (888) 892-1162   ° °Agencies that provide inexpensive medical care: °Organization         Address  Phone   Notes  °[facility]  (336) 832-8035   °[facility]    (336) 832-7272   °[facility] [address] °[place], [place] [address] (336) 832-4777   °[facility] [address], °[place] (336) 271-4999   °[facility]    (336) 373-0678   °[facility]    (336) 272-1050   °[facility] ° [address], [place] Phone:  (336) 832-4444, Fax:  (336) 832-4440 Hours of Operation:  9 am - 6 pm, M-F.  Also accepts Medicaid/Medicare and self-pay.  °[facility] ° [address], [place] Phone: (336) 832-3150, Fax: (336) [phone]. Hours of Operation:  8:30 am - 5:30 pm, M-F.  Also accepts Medicaid and self-pay.  °[facility] [place] [address], [place] Phone: (336) 878-6027   °[facility] [address], [place], [place] (336)723-1848, Ext. 123 Mondays & Thursdays: 7-9 AM.  First 15 patients are seen on a first come, first serve basis. °  ° °Medicaid-accepting [place] Providers: ° °Organization         Address  Phone   Notes  °[facility] [address], [place] (336) 641-2100 Also accepts self-pay patients.  °[facility] [address], [place] ° (336) 856-9996   °[facility] [address], [place] (336) 288-8857   °[facility] [address] [place] [address], [place] (336) 299-7000   °Veita Bland [address], [place]  ° (336)  373-1557 Only accepts [place] Access Medicaid patients after they have their name applied to their card.  ° °Self-Pay (no insurance) in [place]: ° °Organization         Address  Phone   Notes  °Sickle Cell Patients, [facility] [address], [place] (336) 832-1970   °[facility] [address], [place] (336) 832-4400   °[facility] [place] ° [address],  (336) 992-4800   °[facility]/Dr. Osei-Bonsu ° [address] [place] [address], [place] or [address], [place] (336) 841-8500 Phone number for both [place] and [place] locations is the same.  °[facility] [address], [place] (336) 299-0000   °[facility] [address] [place] [address], [place] or [address] (336) 852-7530 °(336) 878-2260   °[facility] [address], [place] (336) 350-1642, phone; (336) 294-5005, fax Sees patients 1st and 3rd Saturday of every month.  Must not qualify for public or private insurance (i.e. Medicaid, Medicare, [place] Health Choice, Veterans' Benefits) • Household income should be no more than 200% of the poverty level •The clinic cannot treat you if you are pregnant or think you are pregnant • Sexually transmitted diseases are not treated at the clinic.  ° ° °Dental Care: °Organization         Address  Phone  Notes  °[facility] [address], [place] (336) 641-6152 Accepts children up to age 21 who are enrolled in Medicaid or [place] Health Choice; pregnant women with a Medicaid card; and children who have applied for Medicaid or [place] Health Choice, but were declined, whose parents can pay a reduced fee at time of service.  °[facility] [place]  [address], [place] (336) 641-7733 Accepts children up to age 21 who are enrolled in Medicaid or [place] Health  Choice; pregnant women with a Medicaid card; and children who have applied for Medicaid or [place] Health Choice, but were declined, whose parents can pay a reduced fee at time of service.  °[facility] ° [address], [place] (336) 641-4533 Patients are seen by appointment only. Walk-ins are not accepted. [facility] will see patients 18 years of age and older. °Monday - Tuesday (8am-5pm) °Most Wednesdays (8:30-5pm) °$30 per visit, cash only  °[facility] ° [address], [place] (336) 641-4533 Patients are seen by appointment only. Walk-ins are not accepted. [facility] will see patients 18 years of age and older. °One Wednesday Evening (Monthly: Volunteer Based).  $30 per visit, cash only  °[facility]  (919) 537-3737 for adults; Children under age 4, call Graduate Pediatric Dentistry at (919) [phone]. Children aged 4-14, please call (919) [phone] to request a pediatric application. ° Dental services are provided in all areas of dental care including fillings, crowns and bridges, complete and partial dentures, implants, gum treatment, root canals, and extractions. Preventive care is also provided. Treatment is provided to both adults and children. °Patients are selected via a lottery and there is often a waiting list. °  °[facility] [address], °[place] ° (336) 763-8833 [email or web address] °  °[facility] [address], [place], [place] (336)723-1848, Ext. 123 Second and Fourth Thursday of each month, opens at 6:30 AM; Clinic ends at 9 AM.  Patients are seen on a first-come first-served basis, and a limited number are seen during each clinic.  ° °[facility] ° [address], [place], [place] (336) 723-7904   Eligibility Requirements °You must have lived in [place], [place], or [place] counties for at least the last three months. °  You cannot be eligible for state or  federal sponsored healthcare insurance, including Veterans Administration, Medicaid, or Medicare. °  You generally cannot be eligible for healthcare insurance through your employer.  °  How to apply: °Eligibility screenings are held every Tuesday and Wednesday afternoon from 1:00 pm until 4:00 pm. You do not need an appointment for the interview!  °[facility] [address], [place], [place] 336-631-2330   °[facility]  336-342-8273   °[facility]  336-703-3100   °[facility]    336-570-6415   ° °Behavioral Health Resources in the Community: °Intensive Outpatient Programs °Organization         Address  Phone  Notes  °[facility] [address], [place], [place] 336-878-6098   °[facility] [address], [place], [place] 336-832-9800   °[facility] [address], [place], [place] ° 336-882-2125   °[facility] [address],  °[place], [place] 1-800-853-5163 or 336-641-4981   °Substance Abuse Resources °Organization         Address  Phone  Notes  °[facility]  336-882-2125   °[facility]  336-784-9470   °[facility]  336-285-9073   °[facility]  336-845-3988   °Residential & Outpatient Substance Abuse Program  1-800-659-3381   °Psychological Services °Organization         Address  Phone  Notes  °[facility]  336- 832-9600   °[facility]  336- 378-7881   °[facility] [address], [place] 1-800-853-5163 or 336-641-4981   ° °Mobile Crisis Teams °Organization         Address  Phone  Notes  °[facility], Mobile Crisis Care Unit  1-877-626-1772   °Assertive °[facility] ° [address] [place], [place] 336-834-9664   °Sharon DeEsch [address] °[place] 336-554-5454   ° °Self-Help/Support Groups °Organization         Address  Phone              Notes  °[facility] [place] - variety of support groups  336- 373-1402 Call for more information  °Narcotics Anonymous (NA), [facility] [address], °[place]  2 meetings at this location  ° °Residential Treatment Programs °Organization         Address  Phone  Notes  °[facility] [address],    °[place]  1-866-801-8205   °[facility] ° [address], [place], [place] 704-293-8524   °[facility] [address], [place] 336-845-3988 Admissions: 8am-3pm M-F  °[facility] [address]    °[place], [place] 336-841-1104   °[facility] [address], [place], [place] 336-379-7146   °[facility] [address]  °[place], [place] 336-285-9073   °[facility] - Intensive Outpatient [address], [place], [place] 336-852-3033   °[facility] [address]  °[place], [place] 1-877-615-2722 or 336-784-9470   °[facility] [address] [place], [place] 336-227-7417 Accepts Medicaid  °[facility] [address]  ° [place] 1-800-659-3381 Substance Abuse/Addiction Treatment  ° °[place] Behavioral Health Resources °Organization         Address  Phone  Notes  °[facility]  (888) 581-9988   °Julie Brannon, PhD [address] [place], [place]   (336) 349-5553 or (336) 951-0000   °[facility]   [address] °[place], [place] (336) 349-4454   °[facility] [address], [place],  (336) 342-8316 Insurance/Medicaid/sponsorship through [facility]  °[facility] [address]                                    [place],  (336) 342-8316 Therapy/tele-psych/case  °[facility] [address]  ° [place], [place] (336) 349-2233    °Dr. Arfeen  (336) 349-4544   °[facility] [place]  [employer] [place] [facility] 1) [address], [place] °2) [address], [place] °3)  [address], [place] (336) 349-3220 °(336) 342-7768 ° °(336) 342-8140   °[place] [facility] (336) 342-1394 or (336) 342-3537 (After Hours)    ° ° ° °

## 2015-07-26 NOTE — ED Notes (Signed)
Pt found in waiting room; states he was looking for the bathroom; PA to discharge pt; awaiting for security to discharge

## 2015-07-26 NOTE — ED Notes (Signed)
Per EMS- pt here for ETOH intoxication; pt with hx of same and may have drank some mouth wash today per norm for pt; pt refuses to answer my questions at present but is cooperative otherwise

## 2015-07-26 NOTE — ED Notes (Signed)
RN entered room for rounds and pt is not present; EMT attempted to search for pt but was not found; PA and Md notified

## 2015-07-26 NOTE — ED Provider Notes (Signed)
CSN: [id number]     Arrival date & time 07/26/15  1658 History   First MD Initiated Contact with Patient 07/26/15 2019     Chief Complaint  Patient presents with  . Alcohol Intoxication     (Consider location/radiation/quality/duration/timing/severity/associated sxs/prior Treatment) HPI  Past Medical History  Diagnosis Date  . Alcohol abuse   . Depression   . Bipolar 1 disorder, depressed (HCC)    Past Surgical History  Procedure Laterality Date  . No past surgeries     Family History  Problem Relation Age of Onset  . Hypertension Mother   . Cancer Father   . Heart failure Father   . Alcoholism Father    Social History  Substance Use Topics  . Smoking status: Current Every Day Smoker  . Smokeless tobacco: None  . Alcohol Use: Yes     Comment: daily - "whatever I can get" - 1/5 plus daily    Review of Systems    Allergies  Review of patient's allergies indicates no known allergies.  Home Medications   Prior to Admission medications   Medication Sig Start Date End Date Taking? Authorizing Provider  acamprosate (CAMPRAL) 333 MG tablet Take 2 tablets (666 mg total) by mouth 3 (three) times daily with meals. For alcoholism, Patient not taking: Reported on 07/26/2015 02/16/15   Sanjuana Kava, NP  FLUoxetine (PROZAC) 20 MG capsule Take 1 capsule (20 mg total) by mouth daily. For depression Patient not taking: Reported on 07/26/2015 02/16/15   Sanjuana Kava, NP  gabapentin (NEURONTIN) 300 MG capsule Take 1 capsule (300 mg total) by mouth 3 (three) times daily. For agitation Patient not taking: Reported on 07/26/2015 02/16/15   Sanjuana Kava, NP  hydrocerin (EUCERIN) CREA Apply 1 application topically 2 (two) times daily. For dry skin Patient not taking: Reported on 05/08/2015 02/16/15   Sanjuana Kava, NP  hydrOXYzine (ATARAX/VISTARIL) 50 MG tablet Take 1 tablet (50 mg total) by mouth at bedtime as needed for anxiety (insomnia). Patient not taking: Reported on 07/26/2015  02/16/15   Sanjuana Kava, NP  ibuprofen (ADVIL,MOTRIN) 800 MG tablet Take 1 tablet (800 mg total) by mouth 3 (three) times daily. Patient not taking: Reported on 07/26/2015 03/19/15   Tommi Rumps, PA-C  OLANZapine (ZYPREXA) 10 MG tablet Take 1 tablet (10 mg total) by mouth at bedtime. For mood control Patient not taking: Reported on 07/26/2015 02/16/15   Sanjuana Kava, NP  traZODone (DESYREL) 150 MG tablet Take 1 tablet (150 mg total) by mouth at bedtime. Patient not taking: Reported on 07/26/2015 02/16/15   Sanjuana Kava, NP   BP 141/84 mmHg  Pulse 105  Temp(Src) 97.9 F (36.6 C) (Oral)  Resp 16  SpO2 96% Physical Exam  ED Course  Procedures (including critical care time) Labs Review Labs Reviewed - No data to display  Imaging Review No results found. I have personally reviewed and evaluated these images and lab results as part of my medical decision-making.   EKG Interpretation None      MDM   Final diagnoses:  Alcohol intoxication, uncomplicated (HCC)     Pt left room.  Pt in lobby.   Pt is clinically sober.   Pt given referrals to alcohol treatment facilities.   Lonia Skinner [place], PA-C 07/26/15 2136  Rolland Porter, MD 07/31/15 [phone]

## 2015-07-28 ENCOUNTER — Encounter (HOSPITAL_COMMUNITY): Payer: Self-pay

## 2015-07-28 ENCOUNTER — Emergency Department (HOSPITAL_COMMUNITY)
Admission: EM | Admit: 2015-07-28 | Discharge: 2015-07-28 | Disposition: A | Discharge status: Home / Self Care | Payer: Self-pay | Attending: Emergency Medicine | Admitting: Emergency Medicine

## 2015-07-28 ENCOUNTER — Emergency Department (HOSPITAL_COMMUNITY): Payer: Self-pay

## 2015-07-28 DIAGNOSIS — W1839XA Other fall on same level, initial encounter: Secondary | ICD-10-CM | POA: Insufficient documentation

## 2015-07-28 DIAGNOSIS — Y998 Other external cause status: Secondary | ICD-10-CM | POA: Insufficient documentation

## 2015-07-28 DIAGNOSIS — S0512XA Contusion of eyeball and orbital tissues, left eye, initial encounter: Secondary | ICD-10-CM | POA: Insufficient documentation

## 2015-07-28 DIAGNOSIS — F17200 Nicotine dependence, unspecified, uncomplicated: Secondary | ICD-10-CM | POA: Insufficient documentation

## 2015-07-28 DIAGNOSIS — S0990XA Unspecified injury of head, initial encounter: Secondary | ICD-10-CM

## 2015-07-28 DIAGNOSIS — Y9389 Activity, other specified: Secondary | ICD-10-CM | POA: Insufficient documentation

## 2015-07-28 DIAGNOSIS — Y92524 Gas station as the place of occurrence of the external cause: Secondary | ICD-10-CM | POA: Insufficient documentation

## 2015-07-28 DIAGNOSIS — F10120 Alcohol abuse with intoxication, uncomplicated: Secondary | ICD-10-CM | POA: Insufficient documentation

## 2015-07-28 DIAGNOSIS — F319 Bipolar disorder, unspecified: Secondary | ICD-10-CM | POA: Insufficient documentation

## 2015-07-28 DIAGNOSIS — F10920 Alcohol use, unspecified with intoxication, uncomplicated: Secondary | ICD-10-CM

## 2015-07-28 NOTE — ED Provider Notes (Signed)
CSN: [id number]     Arrival date & time 07/28/15  0023 History  By signing my name below, I, Sonum Patel, attest that this documentation has been prepared under the direction and in the presence of Azalia Bilis, MD. Electronically Signed: Sonum Patel, Neurosurgeon. 07/28/2015. 12:28 AM.    Chief Complaint  Patient presents with  . Alcohol Intoxication  . Fall   The history is provided by the patient. No language interpreter was used.     LEVEL 5 CAVEAT: Alcohol Intoxication HPI Comments: Jordan Davidson is a 45 y.o. male with past medical history of alcohol abuse who presents to the Emergency Department complaining of alcohol intoxication and a fall that occurred PTA. Per nursing note, patient was found on the floor of a gas station with left eye swelling. He denies neck pain.  Past Medical History  Diagnosis Date  . Alcohol abuse   . Depression   . Bipolar 1 disorder, depressed (HCC)    Past Surgical History  Procedure Laterality Date  . No past surgeries     Family History  Problem Relation Age of Onset  . Hypertension Mother   . Cancer Father   . Heart failure Father   . Alcoholism Father    Social History  Substance Use Topics  . Smoking status: Current Every Day Smoker  . Smokeless tobacco: None  . Alcohol Use: Yes     Comment: daily - "whatever I can get" - 1/5 plus daily    Review of Systems  Unable to perform ROS: Other    Allergies  Review of patient's allergies indicates no known allergies.  Home Medications   Prior to Admission medications   Medication Sig Start Date End Date Taking? Authorizing Provider  acamprosate (CAMPRAL) 333 MG tablet Take 2 tablets (666 mg total) by mouth 3 (three) times daily with meals. For alcoholism, Patient not taking: Reported on 07/26/2015 02/16/15   Sanjuana Kava, NP  FLUoxetine (PROZAC) 20 MG capsule Take 1 capsule (20 mg total) by mouth daily. For depression Patient not taking: Reported on 07/26/2015 02/16/15   Sanjuana Kava, NP   gabapentin (NEURONTIN) 300 MG capsule Take 1 capsule (300 mg total) by mouth 3 (three) times daily. For agitation Patient not taking: Reported on 07/26/2015 02/16/15   Sanjuana Kava, NP  hydrocerin (EUCERIN) CREA Apply 1 application topically 2 (two) times daily. For dry skin Patient not taking: Reported on 05/08/2015 02/16/15   Sanjuana Kava, NP  hydrOXYzine (ATARAX/VISTARIL) 50 MG tablet Take 1 tablet (50 mg total) by mouth at bedtime as needed for anxiety (insomnia). Patient not taking: Reported on 07/26/2015 02/16/15   Sanjuana Kava, NP  ibuprofen (ADVIL,MOTRIN) 800 MG tablet Take 1 tablet (800 mg total) by mouth 3 (three) times daily. Patient not taking: Reported on 07/26/2015 03/19/15   Tommi Rumps, PA-C  OLANZapine (ZYPREXA) 10 MG tablet Take 1 tablet (10 mg total) by mouth at bedtime. For mood control Patient not taking: Reported on 07/26/2015 02/16/15   Sanjuana Kava, NP  traZODone (DESYREL) 150 MG tablet Take 1 tablet (150 mg total) by mouth at bedtime. Patient not taking: Reported on 07/26/2015 02/16/15   Sanjuana Kava, NP   BP 106/68 mmHg  Pulse 90  Temp(Src) 97.5 F (36.4 C) (Oral)  Resp 16  SpO2 93% Physical Exam  Constitutional: He is oriented to person, place, and time. He appears well-developed and well-nourished.  HENT:  Head: Normocephalic.  Abrasion to left forehead. Left  supraorbital hematoma   Eyes: EOM are normal.  Neck: Normal range of motion.  No cervical spine tenderness or step offs.   Cardiovascular: Normal rate, regular rhythm, normal heart sounds and intact distal pulses.   Pulmonary/Chest: Effort normal and breath sounds normal. No respiratory distress.  Abdominal: Soft. He exhibits no distension. There is no tenderness.  Musculoskeletal: Normal range of motion.  Full ROM of major joints of both upper and lower extremities.   Neurological: He is alert and oriented to person, place, and time.  Skin: Skin is warm and dry.  Psychiatric: He has a normal mood  and affect. Judgment normal.  Nursing note and vitals reviewed.   ED Course  Procedures (including critical care time)  DIAGNOSTIC STUDIES: Oxygen Saturation is 96% on RA, adequate by my interpretation.    COORDINATION OF CARE: 12:33 AM Discussed treatment plan with pt at bedside and pt agreed to plan.   Labs Review Labs Reviewed - No data to display  Imaging Review Ct Head Wo Contrast  07/28/2015  CLINICAL DATA:  Found down at gas station, alcohol intoxication. LEFT eye swelling. History of alcohol abuse, depression, bipolar disorder. EXAM: CT HEAD WITHOUT CONTRAST CT CERVICAL SPINE WITHOUT CONTRAST TECHNIQUE: Multidetector CT imaging of the head and cervical spine was performed following the standard protocol without intravenous contrast. Multiplanar CT image reconstructions of the cervical spine were also generated. COMPARISON:  CT head March 18, 2015 FINDINGS: CT HEAD FINDINGS Mild ventriculomegaly on the basis of global parenchymal brain volume loss, similar to prior CT. No intraparenchymal hemorrhage, mass effect nor midline shift. No acute large vascular territory infarcts. No abnormal extra-axial fluid collections. Basal cisterns are patent. No skull fracture. Moderate LEFT frontal scalp hematoma, mild periorbital soft tissue swelling without subcutaneous gas or radiopaque foreign bodies. The included ocular globes and orbital contents are non-suspicious. Moderate paranasal sinus mucosal thickening without air-fluid levels. The mastoid air cells are well aerated. CT CERVICAL SPINE FINDINGS Cervical vertebral bodies and posterior elements are intact. Straightened cervical lordosis. Moderate C5-6 and C6-7 disc height loss with endplate sclerosis of marginal spurring compatible with degenerative discs, resulting an moderate LEFT C5-6 and moderate LEFT C6-7 neural foraminal narrowing. Mild calcific atherosclerosis of the carotid bulbs, advanced for age. IMPRESSION: CT HEAD: No acute  intracranial process. Moderate LEFT frontal scalp hematoma and mild LEFT periorbital soft tissue swelling. No skull fracture. No postseptal hematoma. Stable mild global parenchymal brain volume loss for age. CT CERVICAL SPINE: Straightened cervical lordosis without acute fracture nor malalignment. Moderate LEFT C5-6 and LEFT C6-7 neural foraminal narrowing. Electronically Signed   By: Awilda Metro M.D.   On: 07/28/2015 01:43   Ct Cervical Spine Wo Contrast  07/28/2015  CLINICAL DATA:  Found down at gas station, alcohol intoxication. LEFT eye swelling. History of alcohol abuse, depression, bipolar disorder. EXAM: CT HEAD WITHOUT CONTRAST CT CERVICAL SPINE WITHOUT CONTRAST TECHNIQUE: Multidetector CT imaging of the head and cervical spine was performed following the standard protocol without intravenous contrast. Multiplanar CT image reconstructions of the cervical spine were also generated. COMPARISON:  CT head March 18, 2015 FINDINGS: CT HEAD FINDINGS Mild ventriculomegaly on the basis of global parenchymal brain volume loss, similar to prior CT. No intraparenchymal hemorrhage, mass effect nor midline shift. No acute large vascular territory infarcts. No abnormal extra-axial fluid collections. Basal cisterns are patent. No skull fracture. Moderate LEFT frontal scalp hematoma, mild periorbital soft tissue swelling without subcutaneous gas or radiopaque foreign bodies. The included ocular globes and orbital  contents are non-suspicious. Moderate paranasal sinus mucosal thickening without air-fluid levels. The mastoid air cells are well aerated. CT CERVICAL SPINE FINDINGS Cervical vertebral bodies and posterior elements are intact. Straightened cervical lordosis. Moderate C5-6 and C6-7 disc height loss with endplate sclerosis of marginal spurring compatible with degenerative discs, resulting an moderate LEFT C5-6 and moderate LEFT C6-7 neural foraminal narrowing. Mild calcific atherosclerosis of the carotid  bulbs, advanced for age. IMPRESSION: CT HEAD: No acute intracranial process. Moderate LEFT frontal scalp hematoma and mild LEFT periorbital soft tissue swelling. No skull fracture. No postseptal hematoma. Stable mild global parenchymal brain volume loss for age. CT CERVICAL SPINE: Straightened cervical lordosis without acute fracture nor malalignment. Moderate LEFT C5-6 and LEFT C6-7 neural foraminal narrowing. Electronically Signed   By: Awilda Metro M.D.   On: 07/28/2015 01:43   I have personally reviewed and evaluated these images  as part of my medical decision-making.   EKG Interpretation None      MDM   Final diagnoses:  None   Well-appearing.  [place] in emergency department.  Discharge home in good condition.  I personally performed the services described in this documentation, which was scribed in my presence. The recorded information has been reviewed and is accurate.        Azalia Bilis, MD 07/28/15 [phone]

## 2015-07-28 NOTE — ED Notes (Signed)
Bed: [facility] Expected date:  Expected time:  Means of arrival:  Comments: 45 yo M  Fall, ETOH

## 2015-07-28 NOTE — ED Notes (Signed)
Pt was found on floor in gas station bathroom with swelling above left eye. Pt admits to drinking. 129 cbg.

## 2015-08-05 ENCOUNTER — Emergency Department (HOSPITAL_COMMUNITY)
Admission: EM | Admit: 2015-08-05 | Discharge: 2015-08-05 | Disposition: A | Discharge status: Home / Self Care | Payer: Self-pay | Attending: Emergency Medicine | Admitting: Emergency Medicine

## 2015-08-05 ENCOUNTER — Encounter (HOSPITAL_COMMUNITY): Payer: Self-pay | Admitting: Emergency Medicine

## 2015-08-05 DIAGNOSIS — F1010 Alcohol abuse, uncomplicated: Secondary | ICD-10-CM

## 2015-08-05 DIAGNOSIS — F319 Bipolar disorder, unspecified: Secondary | ICD-10-CM | POA: Insufficient documentation

## 2015-08-05 DIAGNOSIS — F10129 Alcohol abuse with intoxication, unspecified: Secondary | ICD-10-CM | POA: Insufficient documentation

## 2015-08-05 DIAGNOSIS — F17200 Nicotine dependence, unspecified, uncomplicated: Secondary | ICD-10-CM | POA: Insufficient documentation

## 2015-08-05 NOTE — Discharge Instructions (Signed)
1. Medications: usual home medications 2. Treatment: rest, drink plenty of fluids 3. Follow Up: please followup with your primary doctor for discussion of your diagnoses and further evaluation after today's visit; if you do not have a primary care doctor use the resource guide provided to find one; please return to the ER for new or worsening symptoms   Alcohol Use Disorder Alcohol use disorder is a mental disorder. It is not a one-time incident of heavy drinking. Alcohol use disorder is the excessive and uncontrollable use of alcohol over time that leads to problems with functioning in one or more areas of daily living. People with this disorder risk harming themselves and others when they drink to excess. Alcohol use disorder also can cause other mental disorders, such as mood and anxiety disorders, and serious physical problems. People with alcohol use disorder often misuse other drugs.  Alcohol use disorder is common and widespread. Some people with this disorder drink alcohol to cope with or escape from negative life events. Others drink to relieve chronic pain or symptoms of mental illness. People with a family history of alcohol use disorder are at higher risk of losing control and using alcohol to excess.  Drinking too much alcohol can cause injury, accidents, and health problems. One drink can be too much when you are:  Working.  Pregnant or breastfeeding.  Taking medicines. Ask your doctor.  Driving or planning to drive. SYMPTOMS  Signs and symptoms of alcohol use disorder may include the following:   Consumption ofalcohol inlarger amounts or over a longer period of time than intended.  Multiple unsuccessful attempts to cutdown or control alcohol use.   A great deal of time spent obtaining alcohol, using alcohol, or recovering from the effects of alcohol (hangover).  A strong desire or urge to use alcohol (cravings).   Continued use of alcohol despite problems at work,  school, or home because of alcohol use.   Continued use of alcohol despite problems in relationships because of alcohol use.  Continued use of alcohol in situations when it is physically hazardous, such as driving a car.  Continued use of alcohol despite awareness of a physical or psychological problem that is likely related to alcohol use. Physical problems related to alcohol use can involve the brain, heart, liver, stomach, and intestines. Psychological problems related to alcohol use include intoxication, depression, anxiety, psychosis, delirium, and dementia.   The need for increased amounts of alcohol to achieve the same desired effect, or a decreased effect from the consumption of the same amount of alcohol (tolerance).  Withdrawal symptoms upon reducing or stopping alcohol use, or alcohol use to reduce or avoid withdrawal symptoms. Withdrawal symptoms include:  Racing heart.  Hand tremor.  Difficulty sleeping.  Nausea.  Vomiting.  Hallucinations.  Restlessness.  Seizures. DIAGNOSIS Alcohol use disorder is diagnosed through an assessment by your health care provider. Your health care provider may start by asking three or four questions to screen for excessive or problematic alcohol use. To confirm a diagnosis of alcohol use disorder, at least two symptoms must be present within a 65-month period. The severity of alcohol use disorder depends on the number of symptoms:  Mild--two or three.  Moderate--four or five.  Severe--six or more. Your health care provider may perform a physical exam or use results from lab tests to see if you have physical problems resulting from alcohol use. Your health care provider may refer you to a mental health professional for evaluation. TREATMENT  Some people  with alcohol use disorder are able to reduce their alcohol use to low-risk levels. Some people with alcohol use disorder need to quit drinking alcohol. When necessary, mental health  professionals with specialized training in substance use treatment can help. Your health care provider can help you decide how severe your alcohol use disorder is and what type of treatment you need. The following forms of treatment are available:   Detoxification. Detoxification involves the use of prescription medicines to prevent alcohol withdrawal symptoms in the first week after quitting. This is important for people with a history of symptoms of withdrawal and for heavy drinkers who are likely to have withdrawal symptoms. Alcohol withdrawal can be dangerous and, in severe cases, cause death. Detoxification is usually provided in a hospital or in-patient substance use treatment facility.  Counseling or talk therapy. Talk therapy is provided by substance use treatment counselors. It addresses the reasons people use alcohol and ways to keep them from drinking again. The goals of talk therapy are to help people with alcohol use disorder find healthy activities and ways to cope with life stress, to identify and avoid triggers for alcohol use, and to handle cravings, which can cause relapse.  Medicines.Different medicines can help treat alcohol use disorder through the following actions:  Decrease alcohol cravings.  Decrease the positive reward response felt from alcohol use.  Produce an uncomfortable physical reaction when alcohol is used (aversion therapy).  Support groups. Support groups are run by people who have quit drinking. They provide emotional support, advice, and guidance. These forms of treatment are often combined. Some people with alcohol use disorder benefit from intensive combination treatment provided by specialized substance use treatment centers. Both inpatient and outpatient treatment programs are available.   This information is not intended to replace advice given to you by your health care provider. Make sure you discuss any questions you have with your health care  provider.   Document Released: 07/20/2004 Document Revised: 07/03/2014 Document Reviewed: 09/19/2012 Elsevier Interactive Patient Education 2016 [employer].   Emergency Department Resource Guide 1) Find a Doctor and Pay Out of Pocket Although you won't have to find out who is covered by your insurance plan, it is a good idea to ask around and get recommendations. You will then need to call the office and see if the doctor you have chosen will accept you as a new patient and what types of options they offer for patients who are self-pay. Some doctors offer discounts or will set up payment plans for their patients who do not have insurance, but you will need to ask so you aren't surprised when you get to your appointment.  2) Contact Your Local Health Department Not all health departments have doctors that can see patients for sick visits, but many do, so it is worth a call to see if yours does. If you don't know where your local health department is, you can check in your phone book. The CDC also has a tool to help you locate your state's health department, and many state websites also have listings of all of their local health departments.  3) Find a Walk-in Clinic If your illness is not likely to be very severe or complicated, you may want to try a walk in clinic. These are popping up all over the country in pharmacies, drugstores, and shopping centers. They're usually staffed by nurse practitioners or physician assistants that have been trained to treat common illnesses and complaints. They're usually fairly quick and  inexpensive. However, if you have serious medical issues or chronic medical problems, these are probably not your best option.  No Primary Care Doctor: - Call Health Connect at  [phone] - they can help you locate a primary care doctor that  accepts your insurance, provides certain services, etc. - Physician Referral Service- [phone]  Chronic Pain  Problems: Organization         Address  Phone   Notes  [facility]  [phone] Patients need to be referred by their primary care doctor.   Medication Assistance: Organization         Address  Phone   Notes  [facility] [address] [place], [place] [address] [phone] --Must be a resident of [facility] -- Must have NO insurance coverage whatsoever (no Medicaid/ Medicare, etc.) -- The pt. MUST have a primary care doctor that directs their care regularly and follows them in the community   [facility]  [phone]   [employer]  [phone]    Agencies that provide inexpensive medical care: Organization         Address  Phone   Notes  [facility]  [phone]   [facility]    [phone]   [facility] [address] [place], [place] [address] [phone]   [facility] [address] [place], [place] [phone]   Planned Parenthood    [phone]   [facility]    [phone]   [facility]  [address], [place] Phone:  [phone], Fax:  [phone] Hours of Operation:  9 am - 6 pm, M-F.  Also accepts Medicaid/Medicare and self-pay.  [facility]  [address], [place] Phone: [phone], Fax: [phone]. Hours of Operation:  8:30 am - 5:30 pm, M-F.  Also accepts Medicaid and self-pay.  [facility] [place] [address] [place] Phone: [phone]   [facility] [address] Natasha Bence [place], [place] [phone], Ext. [phone] Mondays & Thursdays: 7-9 AM.  First 15 patients are seen on a first come, first serve basis.    Medicaid-accepting [facility] Providers:  Organization         Address  Phone   Notes  [facility] [address], [place] [phone] Also  accepts self-pay patients.  [facility] [address] Laurell Josephs [place], [place]  [phone]   [facility] [address], [place] [phone]   [facility] [address], [place] [phone]   Renaye Rakers [address], [place]   [phone] Only accepts [place] Access [place] patients after they have their name applied to their card.   Self-Pay (no insurance) in [facility]:  Organization         Address  Phone   Notes  Sickle Cell Patients, [facility] Medicine [address] [place], [place] [phone]   [facility] [address] [place], [place] [phone]   [facility] [place]  [address], [place] [phone]   [facility]/Dr. Osei-Bonsu  [name] [place] or [address], [place] [phone] Phone number for both  [place] and [place] locations is the same.  [facility]gent Medical and [facility] [address], [place] [phone]   [facility] [address], [place] or [address] [phone]   [facility] [address] [place] [phone], phone; [phone], fax Sees patients 1st and 3rd Saturday of every month.  Must not qualify for public or private insurance (i.e. Medicaid, Medicare, [place] Health Choice, Veterans' Benefits)  Household income should be no more than 200% of the poverty level The clinic cannot treat you if you are pregnant or think you are pregnant  Sexually transmitted diseases are not treated at the clinic.    Dental Care: Organization         Address  Phone  Notes  [facility] of [facility] [address] [place], [place] [phone] Accepts children up to age 28 who are enrolled in [place] or [place] Health Choice; pregnant  women with a Medicaid card; and children who have applied for Medicaid or [place] Health Choice, but were declined, whose parents can pay a reduced fee at time of service.  [facility] of [facility]  [address], [place] [phone] Accepts children up to age 77 who are enrolled in [place] or [place] Health Choice; pregnant women with a Medicaid card; and children who have applied for Medicaid or [place] Health Choice, but were declined, whose parents can pay a reduced fee at time of service.  [facility]  [address] [place], [place] [phone] Patients are seen by appointment only. Walk-ins are not accepted. [facility] will see patients 22 years of age and older. Monday - Tuesday (8am-5pm) Most Wednesdays (8:30-5pm) $30 per visit, cash only  [facility] Adult Dental Access PROGRAM  [address], [facility] [phone] Patients are seen by appointment only. Walk-ins are not accepted. [facility] will see patients 8 years of age and older. One Wednesday Evening (Monthly: Volunteer Based).  $30 per visit, cash only  [employer]  [phone] for adults; Children under age 46, call Graduate Pediatric Dentistry at [phone]. Children aged 24-14, please call [phone] to request a pediatric application.  Dental services are provided in all areas of dental care including fillings, crowns and bridges, complete and partial dentures, implants, gum treatment, root canals, and extractions. Preventive care is also provided. Treatment is provided to both adults and children. Patients are selected via a lottery and there is often a waiting list.   [facility] [address] [place]  [phone] [email or web address]   [facility] [address] [place], [place] [phone], Ext. [phone] Second and Fourth Thursday of each month, opens at 6:30 AM; Clinic ends at 9 AM.  Patients are  seen on a first-come first-served basis, and a limited number are seen during each clinic.   [facility]  [address] Ether Griffins [place], [place] [phone]   Eligibility Requirements You must have lived in [place], [place], or [place] counties for at least the last three months.   You cannot be eligible for state or federal sponsored [place], including [employer], [place], or [employer].   You generally cannot be eligible for healthcare insurance through your employer.    How to apply: Eligibility screenings are held every Tuesday and Wednesday afternoon from 1:00 pm until 4:00 pm.  You do not need an appointment for the interview!  [facility] [address], [place], [place] [phone]   [facility] Health Department  [phone]   [facility]  [phone]   [facility] Health Department  [phone]    Behavioral Health Resources in the Community: Intensive Outpatient Programs Organization         Address  Phone  Notes  [facility] Services [address], [place], [place] [phone]   [facility]patient [address], [place], [place] [phone]   [facility] [address], [place], [place]  [phone]   [facility] Mental Health [address]  [place], [place] [phone] or [phone]   Substance Abuse Resources Organization         Address  Phone  Notes  [facility]  [phone]   [facility]  [phone]   [facility]  [phone]   Floydene Flock  [phone]   Residential & Outpatient Substance Abuse Program  [phone]   Psychological Services Organization         Address  Phone  Notes  [facility] Health  [phone]   [facility]  [phone]   [facility] Mental Health [address] [place] [phone] or [phone]    Mobile Crisis  Teams Organization         Address  Phone  Notes  [facility], Mobile Crisis Care Unit  [phone]   [facility]  [address] [place], [place] [phone]   Doristine Locks [address] [place] [phone]    Self-Help/Support Groups Organization         Address  Phone             Notes  [facility] - variety of support groups  [phone]- I7437963 Call for more information  Narcotics Anonymous (NA), [facility] [address], [employer]   2 meetings at this location   Statistician         Address  Phone  Notes  [facility] [address],    [place]  [phone]   [facility]  [address] [place] [address], [place], [place] [phone]   [facility] [address] [place], [place] [phone] Admissions: 8am-3pm M-F  [facility] [address],    [place], [place] [phone]   [facility] [address] [place], [place], [place] [phone]   [facility] [address]  [place], [place] [phone]   [facility] - Intensive Outpatient [address] Laurell Josephs [address], [place], [place] [phone]   [facility] ([facility] [address] [place]  [place], [place] [phone] or [phone]   [facility] ([facility]) [address] [place], [place] [phone] Accepts Medicaid  [facility] [address],  [place] [phone] Substance Abuse/Addiction Treatment   [facility] Organization         Address  Phone  Notes  [facility]  [phone]   Angie Fava, PhD [address] Ervin Knack [place], [place]   [phone] or [phone]   [facility] Behavioral   [address] [place], [place] [phone]   [facility] [address], [place], [place] [phone]  Insurance/Medicaid/sponsorship through [employer] and Families [address]  [place], [place] [phone] [place], [place] [phone]    Dr. Adele Schilder  [phone]   [facility] Dept. 1) [address] [place] 2) [place] 3)  [address], [place] [phone]  [phone]   [place] [phone] or [phone] (After Hours)

## 2015-08-05 NOTE — ED Provider Notes (Signed)
CSN: [id number]     Arrival date & time 08/05/15  1831 History   First MD Initiated Contact with Patient 08/05/15 1931     Chief Complaint  Patient presents with  . Alcohol Intoxication     HPI   Jordan Davidson is a 45 y.o. male with a PMH of depression, bipolar disorder, alcohol abuse who presents to the ED with alcohol abuse. Reports he drank 1/5th of liquor, 3 beers, and part of a bottle of listerine today prior to arrival. Denies headache, lightheadedness, dizziness, chest pain, shortness of breath, abdominal pain, N/V, numbness, weakness, paresthesia, recent injury, hallucinations, SI/HI. Per report, the patient's friend called EMS due to it being cold outside. The patient states he has no complaints at this time, however is requesting something to eat.   Past Medical History  Diagnosis Date  . Alcohol abuse   . Depression   . Bipolar 1 disorder, depressed (HCC)    Past Surgical History  Procedure Laterality Date  . No past surgeries     Family History  Problem Relation Age of Onset  . Hypertension Mother   . Cancer Father   . Heart failure Father   . Alcoholism Father    Social History  Substance Use Topics  . Smoking status: Current Every Day Smoker  . Smokeless tobacco: None  . Alcohol Use: Yes     Comment: daily - "whatever I can get" - 1/5 plus daily     Review of Systems  Constitutional: Negative for fever.  Respiratory: Negative for shortness of breath.   Cardiovascular: Negative for chest pain.  Gastrointestinal: Negative for nausea, vomiting and abdominal pain.  Musculoskeletal: Negative for myalgias and arthralgias.  Neurological: Negative for dizziness, syncope, weakness, light-headedness, numbness and headaches.  All other systems reviewed and are negative.     Allergies  Review of patient's allergies indicates no known allergies.  Home Medications   Prior to Admission medications   Medication Sig Start Date End Date Taking? Authorizing Provider   acamprosate (CAMPRAL) 333 MG tablet Take 2 tablets (666 mg total) by mouth 3 (three) times daily with meals. For alcoholism, Patient not taking: Reported on 07/26/2015 02/16/15   Sanjuana Kava, NP  FLUoxetine (PROZAC) 20 MG capsule Take 1 capsule (20 mg total) by mouth daily. For depression Patient not taking: Reported on 07/26/2015 02/16/15   Sanjuana Kava, NP  gabapentin (NEURONTIN) 300 MG capsule Take 1 capsule (300 mg total) by mouth 3 (three) times daily. For agitation Patient not taking: Reported on 07/26/2015 02/16/15   Sanjuana Kava, NP  hydrocerin (EUCERIN) CREA Apply 1 application topically 2 (two) times daily. For dry skin Patient not taking: Reported on 05/08/2015 02/16/15   Sanjuana Kava, NP  hydrOXYzine (ATARAX/VISTARIL) 50 MG tablet Take 1 tablet (50 mg total) by mouth at bedtime as needed for anxiety (insomnia). Patient not taking: Reported on 07/26/2015 02/16/15   Sanjuana Kava, NP  ibuprofen (ADVIL,MOTRIN) 800 MG tablet Take 1 tablet (800 mg total) by mouth 3 (three) times daily. Patient not taking: Reported on 07/26/2015 03/19/15   Tommi Rumps, PA-C  OLANZapine (ZYPREXA) 10 MG tablet Take 1 tablet (10 mg total) by mouth at bedtime. For mood control Patient not taking: Reported on 07/26/2015 02/16/15   Sanjuana Kava, NP  traZODone (DESYREL) 150 MG tablet Take 1 tablet (150 mg total) by mouth at bedtime. Patient not taking: Reported on 07/26/2015 02/16/15   Sanjuana Kava, NP  BP 140/97 mmHg  Pulse 99  Temp(Src) 98.1 F (36.7 C) (Oral)  Resp 18  SpO2 98% Physical Exam  Constitutional: He is oriented to person, place, and time. He appears well-developed and well-nourished. No distress.  HENT:  Head: Normocephalic and atraumatic.  Right Ear: External ear normal.  Left Ear: External ear normal.  Nose: Nose normal.  Mouth/Throat: Uvula is midline, oropharynx is clear and moist and mucous membranes are normal.  Eyes: Conjunctivae, EOM and lids are normal. Pupils are equal, round,  and reactive to light. Right eye exhibits no discharge. Left eye exhibits no discharge. No scleral icterus.  Neck: Normal range of motion. Neck supple.  Cardiovascular: Normal rate, regular rhythm, normal heart sounds, intact distal pulses and normal pulses.   Pulmonary/Chest: Effort normal and breath sounds normal. No respiratory distress. He has no wheezes. He has no rales.  Abdominal: Soft. Normal appearance and bowel sounds are normal. He exhibits no distension and no mass. There is no tenderness. There is no rigidity, no rebound and no guarding.  Musculoskeletal: Normal range of motion. He exhibits no edema or tenderness.  Neurological: He is alert and oriented to person, place, and time. He has normal strength. No cranial nerve deficit or sensory deficit.  Patient ambulates independently and without difficulty.  Skin: Skin is warm, dry and intact. No rash noted. He is not diaphoretic. No erythema. No pallor.  Psychiatric: He has a normal mood and affect. His speech is normal and behavior is normal.  Nursing note and vitals reviewed.   ED Course  Procedures (including critical care time)  Labs Review Labs Reviewed - No data to display  Imaging Review No results found.     EKG Interpretation None      MDM   Final diagnoses:  Alcohol abuse    45 year old male presents with alcohol abuse. Per record review, patient has been evaluated numerous times in the ED for the same. Care plan reviewed. Denies complaints at this time. Patient clinically sober. Able to ambulate and tolerate PO intake. Feel he is stable for discharge at this time. Will give resource list for outpatient detox. Patient to follow-up with PCP. Return precautions discussed. Patient verbalizes his understanding and is in agreement with plan.  BP 140/97 mmHg  Pulse 99  Temp(Src) 98.1 F (36.7 C) (Oral)  Resp 18  SpO2 98%     Mady Gemma, PA-C 08/06/15 9528  Linwood Dibbles, MD 08/07/15 1231

## 2015-08-05 NOTE — ED Notes (Signed)
Pt ambulated to BR without assistance

## 2015-08-05 NOTE — ED Notes (Signed)
Patient given bus pass to go home. Patient ambulatory without assistance.

## 2015-08-05 NOTE — ED Notes (Addendum)
Pt brought by EMS for ETOH intoxication, pt currently intoxicated from Listerine. Pt states "he wants to get help." Pt's friend called EMS because "it was getting cold outside and he needs to get somewhere warm." Pt states he drank 48 oz beer and 3 pints listerine.

## 2015-08-10 ENCOUNTER — Emergency Department (HOSPITAL_COMMUNITY)
Admission: EM | Admit: 2015-08-10 | Discharge: 2015-08-11 | Disposition: A | Discharge status: Home / Self Care | Payer: Self-pay | Attending: Physician Assistant | Admitting: Physician Assistant

## 2015-08-10 ENCOUNTER — Encounter (HOSPITAL_COMMUNITY): Payer: Self-pay

## 2015-08-10 DIAGNOSIS — X58XXXA Exposure to other specified factors, initial encounter: Secondary | ICD-10-CM | POA: Insufficient documentation

## 2015-08-10 DIAGNOSIS — Y9389 Activity, other specified: Secondary | ICD-10-CM | POA: Insufficient documentation

## 2015-08-10 DIAGNOSIS — F10220 Alcohol dependence with intoxication, uncomplicated: Secondary | ICD-10-CM | POA: Insufficient documentation

## 2015-08-10 DIAGNOSIS — S0512XA Contusion of eyeball and orbital tissues, left eye, initial encounter: Secondary | ICD-10-CM | POA: Insufficient documentation

## 2015-08-10 DIAGNOSIS — F319 Bipolar disorder, unspecified: Secondary | ICD-10-CM | POA: Insufficient documentation

## 2015-08-10 DIAGNOSIS — Y9289 Other specified places as the place of occurrence of the external cause: Secondary | ICD-10-CM | POA: Insufficient documentation

## 2015-08-10 DIAGNOSIS — Z79899 Other long term (current) drug therapy: Secondary | ICD-10-CM | POA: Insufficient documentation

## 2015-08-10 DIAGNOSIS — Y998 Other external cause status: Secondary | ICD-10-CM | POA: Insufficient documentation

## 2015-08-10 DIAGNOSIS — F17200 Nicotine dependence, unspecified, uncomplicated: Secondary | ICD-10-CM | POA: Insufficient documentation

## 2015-08-10 MED ORDER — ONDANSETRON 8 MG PO TBDP
8.0000 mg | ORAL_TABLET | Freq: Once | ORAL | Status: AC
  Administered 2015-08-11: 8 mg via ORAL
  Filled 2015-08-10: qty 1

## 2015-08-10 NOTE — ED Notes (Addendum)
Per EMS, Pt picked up from a sidewalk.  C/o ETOH intoxication and wanting ETOH detox.  Pt reports being out of medications x 3 weeks.  Sts he has been drinking "alot."  Old bruising on face noted.

## 2015-08-10 NOTE — ED Provider Notes (Signed)
CSN: [id number]     Arrival date & time 08/10/15  1459 History   First MD Initiated Contact with Patient 08/10/15 2230     Chief Complaint  Patient presents with  . Alcohol Intoxication  . ETOH Detox      (Consider location/radiation/quality/duration/timing/severity/associated sxs/prior Treatment) HPI   Level 5 caveat due to intoxication  45 year old male presents via EMS for alcohol intoxication. EMS reports patient was picked up from a sidewalk and is wanting detox. Patient admits to drinking 2 bottles of Listerine today and states he has been drinking 2 bottles of Listerine for the past month. Endorses having mild nausea. Denies headache, lightheadedness, dizziness, chest pain, shortness of breath, abdominal pain, vomiting, numbness, weakness. Denies SI/HI, hallucinations. Patient has been seen 11 times in last 6 months for similar presentations with active care plan.  Past Medical History  Diagnosis Date  . Alcohol abuse   . Depression   . Bipolar 1 disorder, depressed (HCC)    Past Surgical History  Procedure Laterality Date  . No past surgeries     Family History  Problem Relation Age of Onset  . Hypertension Mother   . Cancer Father   . Heart failure Father   . Alcoholism Father    Social History  Substance Use Topics  . Smoking status: Current Every Day Smoker  . Smokeless tobacco: None  . Alcohol Use: Yes     Comment: daily - "whatever I can get" - 1/5 plus daily    Review of Systems  Unable to perform ROS: Other (intoxicated)      Allergies  Review of patient's allergies indicates no known allergies.  Home Medications   Prior to Admission medications   Medication Sig Start Date End Date Taking? Authorizing Provider  FLUoxetine (PROZAC) 20 MG capsule Take 1 capsule (20 mg total) by mouth daily. For depression 02/16/15  Yes Sanjuana Kava, NP  gabapentin (NEURONTIN) 300 MG capsule Take 1 capsule (300 mg total) by mouth 3 (three) times daily. For agitation  02/16/15  Yes Sanjuana Kava, NP  OLANZapine (ZYPREXA) 10 MG tablet Take 1 tablet (10 mg total) by mouth at bedtime. For mood control 02/16/15  Yes Sanjuana Kava, NP  traZODone (DESYREL) 150 MG tablet Take 1 tablet (150 mg total) by mouth at bedtime. 02/16/15  Yes Sanjuana Kava, NP  acamprosate (CAMPRAL) 333 MG tablet Take 2 tablets (666 mg total) by mouth 3 (three) times daily with meals. For alcoholism, Patient not taking: Reported on 07/26/2015 02/16/15   Sanjuana Kava, NP  hydrocerin (EUCERIN) CREA Apply 1 application topically 2 (two) times daily. For dry skin Patient not taking: Reported on 05/08/2015 02/16/15   Sanjuana Kava, NP  hydrOXYzine (ATARAX/VISTARIL) 50 MG tablet Take 1 tablet (50 mg total) by mouth at bedtime as needed for anxiety (insomnia). Patient not taking: Reported on 07/26/2015 02/16/15   Sanjuana Kava, NP  ibuprofen (ADVIL,MOTRIN) 800 MG tablet Take 1 tablet (800 mg total) by mouth 3 (three) times daily. Patient not taking: Reported on 07/26/2015 03/19/15   Tommi Rumps, PA-C   BP 188/134 mmHg  Pulse 119  Temp(Src) 98.2 F (36.8 C) (Oral)  Resp 18  SpO2 95% Physical Exam  Constitutional: He is oriented to person, place, and time. He appears well-developed and well-nourished. No distress.  HENT:  Head: Normocephalic and atraumatic.  Mouth/Throat: Oropharynx is clear and moist. No oropharyngeal exudate.  Small ecchymoses noted to left infraorbital region.  Eyes: Conjunctivae  and EOM are normal. Pupils are equal, round, and reactive to light. Right eye exhibits no discharge. Left eye exhibits no discharge. No scleral icterus.  Neck: Normal range of motion. Neck supple.  Cardiovascular: Normal rate, regular rhythm, normal heart sounds and intact distal pulses.   Pulmonary/Chest: Effort normal and breath sounds normal. No respiratory distress. He has no wheezes. He has no rales. He exhibits no tenderness.  Abdominal: Soft. Bowel sounds are normal. He exhibits no distension  and no mass. There is no tenderness. There is no rebound and no guarding.  Musculoskeletal: Normal range of motion. He exhibits no edema or tenderness.  No C/T/L midline tenderness. FROM of BUE and BLE with 5/5 strength. Sensation grossly intact. 2+ radial and PT pulses.  Lymphadenopathy:    He has no cervical adenopathy.  Neurological: He is alert and oriented to person, place, and time.  Skin: Skin is warm and dry. He is not diaphoretic.  Nursing note and vitals reviewed.   ED Course  Procedures (including critical care time) Labs Review Labs Reviewed - No data to display  Imaging Review No results found. I have personally reviewed and evaluated these images and lab results as part of my medical decision-making.  Filed Vitals:   08/10/15 1514 08/10/15 2215  BP: 137/99 188/134  Pulse: 103 119  Temp: 97.8 F (36.6 C) 98.2 F (36.8 C)  Resp: 16 18     MDM   Final diagnoses:  None   Care plan reviewed.   Pt presents for alcohol detox. Hx of alcohol abuse. Endorses mild nausea, pt given ODT zofran in the ED. Denies SI/HI. Pt appears intoxicated on exam, exam otherwise unremarkable. CIWA score 6. On reevaluation, pt sleeping and resting comfortably in bed.   Hand-off to Shari-Upstill, PA-C. Plan to d/c pt home once clinically sober.   Satira Sark [place], [place] 08/11/15 0134  Courteney Randall An, MD 08/11/15 [phone]

## 2015-08-11 NOTE — ED Provider Notes (Signed)
Patient care signed out at end of shift from [facility], [place], after evaluation for alcohol intoxication.   Here for ETOH dependence Sobering up for discharge home No SI/HI  Plan: anticipate d/ch home when clinically sober.  5:00 - re-evaluation: the patient has been sleeping quietly through the night. On re-evaluation he wakes easily. He is asking for detox from alcohol. He was told we would provide resources for outpatient follow up. He is stating he is suicidal but has no formed plan of suicide.   Care plan reviewed. He can be discharged home with outpatient resources as planned.   Elpidio Anis, PA-C 08/11/15 1610  Tomasita Crumble, MD 08/11/15 [phone]

## 2015-08-11 NOTE — ED Notes (Signed)
Pt states he is "getting shaky" this RN did not observe any tremors. Pt ambulated to bathroom without assistance and was given a soda

## 2015-08-11 NOTE — ED Notes (Signed)
Patient resting with eyes closed

## 2015-08-11 NOTE — ED Notes (Signed)
Patient ambulated to nurses station to request water without any difficulty.

## 2015-08-11 NOTE — ED Notes (Signed)
PA aware of patient's vital signs, tremors, and sweatiness. PA verbalized to discharge patient.

## 2015-08-11 NOTE — ED Notes (Signed)
Refused wheelchair 

## 2015-08-11 NOTE — ED Notes (Signed)
PA at bedside.

## 2015-08-11 NOTE — ED Notes (Signed)
Discharge instructions and follow up care reviewed with patient. Patient verbalized understanding. 

## 2015-08-11 NOTE — Discharge Instructions (Signed)
Substance Abuse Treatment Programs ° °Intensive Outpatient Programs °[facility]     °[address]      °[place], [place]                   °[phone]      ° °[facility] °[address] °[place], [place] °[phone] ° °[facility]     °(Inpatient and outpatient)     °[address]           °[phone]   ° °[facility] °[phone] (Suboxone and Methadone) ° °[address]      °[place], [place] [address]      °[phone]      ° °[address] °[place], [place] °[phone] ° °[facility] (Outpatient/Inpatient, [facility])    °(insurance only) [phone]      °       °[facility] (Groups & Residential) °[place], [place] °[phone] ° °   °[facility]     °[address]     °[place], [place]      °[phone]      ° °[facility] (for caregivers and family) °[address] °[place], [place] °336-299-4655 ° ° ° ° ° °Residential Treatment Programs °[facility]      °[address], [place], [place] [address]  °([phone]      ° °[facility] °[address] [place], [place] [address] °[phone] ° °[facility]        °[phone]      ° °[facility] °[phone] ° °[facility]             °[address]                                         °[place], [place]                                                °[phone] or [phone]                              ° °[facility] [place] °[address] °[place], [address] °[phone] ° °[facility]    °[address]      °[place], [place]     °[phone]      ° °[facility] °[address] °[place], [place] °[phone] ° °[facility]   °[address]     °[place], [place] [address]     °[phone]      °Admissions: 8am-3pm M-F ° °[facility] °[address] °[place],  [place] °[phone] ° °[facility]: Residential Program (90 Days)   °[place],       °336-725-8389 or [phone]    ° °[facility] °[place],  °(Walk in Hours over the weekend or by referral) ° °[facility] °[address], [place],  [address] °([phone] ° °Crisis Mobile: [facility]:  1-877-626-1772 (for crisis response 24 hours a day) °[facility] Hotline:      1-800-256-2452 °Outpatient Psychiatry and Counseling ° °[facility]: Mobile Crisis   Management 24 hours:  1-877-626-1772 ° °[facility] sliding scale fee and walk in schedule: M-F 8am-12pm/1pm-3pm °[address]  °[place], [place] [address] °[phone] ° °[facility] °[address] °[place], [place] [address] °[phone] ° °[facility] (Former[facility] known [facility])- new patient walk-in appointments available Monday - Friday 8am -3pm.          °[address] °[place], [place] [address] °336-676-6840 or crisis line- 336-676-6905 ° °[facility]/ Intensive Outpatient Therapy Program °[address] °[place], [place] [address] °336-832-9804 ° °[facility]                  °Crisis Services      °[phone]      °[address]     °[place], [place] [address]                ° °[facility]   °[facility] °[phone] °[address] °[place], [place] [address] ° ° °[facility]          °[address]  °[place], [place] [address]       °[phone] ° °[facility] °[address] °[place], [place] [address] °336-292-1510 ° °[facility]    °[address]    °[place], [place] [address]     °[phone]      ° °Parish McKinney, [place]     °[address]     °[place] [address]     °[phone]     °  °[facility] °[address] °[place] [address] ° °[facility]     °[address]     °[place], [place]      °336-542-2076      ° °[facility] °Shamsher Ahluwalia, MD °[address] °[place], [place] [address] °[phone] ° °[facility]     °[address]     °[place], [place] [address]     °336-274-1237      ° °[facility] °[address] °[place], [place] [address] °336-854-4450 °Resources for Temporary Residential Assistance/Crisis Centers ° °DAY CENTERS °[facility] °M-F 8am-3pm   °[address] [place], [place] [address]   336-332-0824 °Services include: laundry, barbering, support groups, case management, phone  & computer access, showers, AA/NA mtgs, mental health/substance abuse nurse, job skills class, disability information, VA assistance, spiritual classes, etc.  ° °HOMELESS SHELTERS ° °[facility]     °[facility]   °[address], [place]     °[phone]       °       °[facility] (women and children)       °[address] °, [place] [address] °336-275-0820 °[email or web address] for application and process °Application Required ° °[facility]   °[address]    °[place] [address]     °[phone]       °             °[facility] °[address] °, [place] [address] °336.273.5572 °336-235-0363(schedule application appt.) °Application Required ° °[facility] (women only)    °[address]     °[place], [place] [address]     °336-884-1039      °  Intake starts 6pm daily °Need valid ID, SSC, & Police report °[facility] [place] °[address] °[place], [place] °336-881-5420 °Application Required ° °[facility] (men only)     °[address]      °[place], [place]     °[phone]      ° °[facility] °(Pregnant women only) °[address] °[place], [place] °336-275-0206 ° °[facility]      °[address]      °[place], [place] [address]     °[phone]      °       °[facility] °[address] °[place], [place] °336-723-1848 °90 day commitment/SA/Application process ° °[facility](men only)     °[address]     °[place], [place]     °[phone]       °Check-in at 7pm     °       °[facility] °[address] °[place], [place] [address] °336-248-6684 °Men/Women/Women and Children must be there by 7 pm ° °[facility] °[place],  °336-722-8721                ° °

## 2015-08-20 DIAGNOSIS — Z791 Long term (current) use of non-steroidal anti-inflammatories (NSAID): Secondary | ICD-10-CM | POA: Insufficient documentation

## 2015-08-20 DIAGNOSIS — Z79899 Other long term (current) drug therapy: Secondary | ICD-10-CM | POA: Insufficient documentation

## 2015-08-20 DIAGNOSIS — L02411 Cutaneous abscess of right axilla: Secondary | ICD-10-CM | POA: Insufficient documentation

## 2015-08-20 DIAGNOSIS — F319 Bipolar disorder, unspecified: Secondary | ICD-10-CM | POA: Insufficient documentation

## 2015-08-20 DIAGNOSIS — F17200 Nicotine dependence, unspecified, uncomplicated: Secondary | ICD-10-CM | POA: Insufficient documentation

## 2015-08-21 ENCOUNTER — Encounter (HOSPITAL_COMMUNITY): Payer: Self-pay | Admitting: *Deleted

## 2015-08-21 ENCOUNTER — Emergency Department (HOSPITAL_COMMUNITY)
Admission: EM | Admit: 2015-08-21 | Discharge: 2015-08-21 | Disposition: A | Discharge status: Home / Self Care | Payer: Self-pay | Attending: Emergency Medicine | Admitting: Emergency Medicine

## 2015-08-21 DIAGNOSIS — L0291 Cutaneous abscess, unspecified: Secondary | ICD-10-CM

## 2015-08-21 MED ORDER — CEPHALEXIN 250 MG PO CAPS
250.0000 mg | ORAL_CAPSULE | Freq: Once | ORAL | Status: AC
  Administered 2015-08-21: 250 mg via ORAL
  Filled 2015-08-21: qty 1

## 2015-08-21 MED ORDER — IBUPROFEN 600 MG PO TABS: 600.0000 mg | ORAL_TABLET | Freq: Four times a day (QID) | ORAL | Status: DC | PRN

## 2015-08-21 MED ORDER — IBUPROFEN 800 MG PO TABS
800.0000 mg | ORAL_TABLET | Freq: Once | ORAL | Status: AC
  Administered 2015-08-21: 800 mg via ORAL
  Filled 2015-08-21: qty 1

## 2015-08-21 MED ORDER — CEPHALEXIN 500 MG PO CAPS: 500.0000 mg | ORAL_CAPSULE | Freq: Two times a day (BID) | ORAL | Status: DC

## 2015-08-21 MED ORDER — LIDOCAINE-EPINEPHRINE 2 %-1:100000 IJ SOLN
20.0000 mL | Freq: Once | INTRAMUSCULAR | Status: AC
  Administered 2015-08-21: 20 mL
  Filled 2015-08-21: qty 1

## 2015-08-21 NOTE — ED Notes (Signed)
Pt states that he has a sore come up on right chest now is swollen, hard and black center

## 2015-08-21 NOTE — Discharge Instructions (Signed)
Abscess °An abscess is an infected area that contains a collection of pus and debris. It can occur in almost any part of the body. An abscess is also known as a furuncle or boil. °CAUSES  °An abscess occurs when tissue gets infected. This can occur from blockage of oil or sweat glands, infection of hair follicles, or a minor injury to the skin. As the body tries to fight the infection, pus collects in the area and creates pressure under the skin. This pressure causes pain. People with weakened immune systems have difficulty fighting infections and get certain abscesses more often.  °SYMPTOMS °Usually an abscess develops on the skin and becomes a painful mass that is red, warm, and tender. If the abscess forms under the skin, you may feel a moveable soft area under the skin. Some abscesses break open (rupture) on their own, but most will continue to get worse without care. The infection can spread deeper into the body and eventually into the bloodstream, causing you to feel ill.  °DIAGNOSIS  °Your caregiver will take your medical history and perform a physical exam. A sample of fluid may also be taken from the abscess to determine what is causing your infection. °TREATMENT  °Your caregiver may prescribe antibiotic medicines to fight the infection. However, taking antibiotics alone usually does not cure an abscess. Your caregiver may need to make a small cut (incision) in the abscess to drain the pus. In some cases, gauze is packed into the abscess to reduce pain and to continue draining the area. °HOME CARE INSTRUCTIONS  °· Only take over-the-counter or prescription medicines for pain, discomfort, or fever as directed by your caregiver. °· If you were prescribed antibiotics, take them as directed. Finish them even if you start to feel better. °· If gauze is used, follow your caregiver's directions for changing the gauze. °· To avoid spreading the infection: °· Keep your draining abscess covered with a  bandage. °· Wash your hands well. °· Do not share personal care items, towels, or whirlpools with others. °· Avoid skin contact with others. °· Keep your skin and clothes clean around the abscess. °· Keep all follow-up appointments as directed by your caregiver. °SEEK MEDICAL CARE IF:  °· You have increased pain, swelling, redness, fluid drainage, or bleeding. °· You have muscle aches, chills, or a general ill feeling. °· You have a fever. °MAKE SURE YOU:  °· Understand these instructions. °· Will watch your condition. °· Will get help right away if you are not doing well or get worse. °  °This information is not intended to replace advice given to you by your health care provider. Make sure you discuss any questions you have with your health care provider. °  °Document Released: 03/22/2005 Document Revised: 12/12/2011 Document Reviewed: 08/25/2011 °Elsevier Interactive Patient Education ©2016 [employer] ° °Incision and Drainage °Incision and drainage is a procedure in which a sac-like structure (cystic structure) is opened and drained. The area to be drained usually contains material such as pus, fluid, or blood.  °LET YOUR CAREGIVER KNOW ABOUT:  °· Allergies to medicine. °· Medicines taken, including vitamins, herbs, eyedrops, over-the-counter medicines, and creams. °· Use of steroids (by mouth or creams). °· Previous problems with anesthetics or numbing medicines. °· History of bleeding problems or blood clots. °· Previous surgery. °· Other health problems, including diabetes and kidney problems. °· Possibility of pregnancy, if this applies. °RISKS AND COMPLICATIONS °· Pain. °· Bleeding. °· Scarring. °· Infection. °BEFORE THE PROCEDURE  °  You may need to have an ultrasound or other imaging tests to see how large or deep your cystic structure is. Blood tests may also be used to determine if you have an infection or how severe the infection is. You may need to have a tetanus shot. °PROCEDURE  °The affected area  is cleaned with a cleaning fluid. The cyst area will then be numbed with a medicine (local anesthetic). A small incision will be made in the cystic structure. A syringe or catheter may be used to drain the contents of the cystic structure, or the contents may be squeezed out. The area will then be flushed with a cleansing solution. After cleansing the area, it is often gently packed with a gauze or another wound dressing. Once it is packed, it will be covered with gauze and tape or some other type of wound dressing.  °AFTER THE PROCEDURE  °· Often, you will be allowed to go home right after the procedure. °· You may be given antibiotic medicine to prevent or heal an infection. °· If the area was packed with gauze or some other wound dressing, you will likely need to come back in 1 to 2 days to get it removed. °· The area should heal in about 14 days. °  °This information is not intended to replace advice given to you by your health care provider. Make sure you discuss any questions you have with your health care provider. °  °Document Released: 12/06/2000 Document Revised: 12/12/2011 Document Reviewed: 08/07/2011 °Elsevier Interactive Patient Education ©2016 [employer] ° °

## 2015-08-21 NOTE — ED Provider Notes (Signed)
CSN: [id number]     Arrival date & time 08/20/15  2348 History   First MD Initiated Contact with Patient 08/21/15 0041     Chief Complaint  Patient presents with  . Sore     (Consider location/radiation/quality/duration/timing/severity/associated sxs/prior Treatment) HPI   Patient wit ha hx of alcohol depression, and bipolar disorder comes to the ER with complaints of a spot on his right axilla. Started on Wednesday with a pimple sized red spot. Now it is golf ball sized abscess. It had drained but now is worse. He does not remember being bit by anything.  He is having pain to the site but otherwise is doing well without fevers, nausea, vomiting or diarrhea. Pt calm, cooperative, sober, currently has a job and doing well.  Past Medical History  Diagnosis Date  . Alcohol abuse   . Depression   . Bipolar 1 disorder, depressed (HCC)    Past Surgical History  Procedure Laterality Date  . No past surgeries     Family History  Problem Relation Age of Onset  . Hypertension Mother   . Cancer Father   . Heart failure Father   . Alcoholism Father    Social History  Substance Use Topics  . Smoking status: Current Every Day Smoker  . Smokeless tobacco: None  . Alcohol Use: Yes     Comment: daily - "whatever I can get" - 1/5 plus daily    Review of Systems  Constitutional: Negative for fever, chills, diaphoresis, activity change and fatigue.  HENT: Negative for congestion.   Eyes: Negative for pain.  Respiratory: Negative for cough and shortness of breath.   Cardiovascular: Negative for chest pain.  Gastrointestinal: Negative for diarrhea and constipation.  Musculoskeletal: Negative for arthralgias.  Skin: Positive for wound.  Neurological: Negative for dizziness and headaches.      Allergies  Review of patient's allergies indicates no known allergies.  Home Medications   Prior to Admission medications   Medication Sig Start Date End Date Taking? Authorizing Provider   FLUoxetine (PROZAC) 20 MG capsule Take 1 capsule (20 mg total) by mouth daily. For depression 02/16/15  Yes Sanjuana Kava, NP  gabapentin (NEURONTIN) 300 MG capsule Take 1 capsule (300 mg total) by mouth 3 (three) times daily. For agitation 02/16/15  Yes Sanjuana Kava, NP  hydrOXYzine (ATARAX/VISTARIL) 50 MG tablet Take 1 tablet (50 mg total) by mouth at bedtime as needed for anxiety (insomnia). 02/16/15  Yes Sanjuana Kava, NP  ibuprofen (ADVIL,MOTRIN) 800 MG tablet Take 1 tablet (800 mg total) by mouth 3 (three) times daily. 03/19/15  Yes Tommi Rumps, PA-C  OLANZapine (ZYPREXA) 10 MG tablet Take 1 tablet (10 mg total) by mouth at bedtime. For mood control 02/16/15  Yes Sanjuana Kava, NP  traZODone (DESYREL) 150 MG tablet Take 1 tablet (150 mg total) by mouth at bedtime. 02/16/15  Yes Sanjuana Kava, NP  acamprosate (CAMPRAL) 333 MG tablet Take 2 tablets (666 mg total) by mouth 3 (three) times daily with meals. For alcoholism, Patient not taking: Reported on 07/26/2015 02/16/15   Sanjuana Kava, NP  cephALEXin (KEFLEX) 500 MG capsule Take 1 capsule (500 mg total) by mouth 2 (two) times daily. 08/21/15   Twylah Bennetts Neva Seat, PA-C  hydrocerin (EUCERIN) CREA Apply 1 application topically 2 (two) times daily. For dry skin Patient not taking: Reported on 05/08/2015 02/16/15   Sanjuana Kava, NP  ibuprofen (ADVIL,MOTRIN) 600 MG tablet Take 1 tablet (600 mg total)  by mouth every 6 (six) hours as needed. 08/21/15   Lizzette Carbonell Neva Seat, PA-C   BP 131/91 mmHg  Pulse 88  Temp(Src) 98.2 F (36.8 C) (Oral)  Resp 20  SpO2 100% Physical Exam  Constitutional: He appears well-developed and well-nourished. No distress.  HENT:  Head: Normocephalic and atraumatic.  Right Ear: Tympanic membrane and ear canal normal.  Left Ear: Tympanic membrane and ear canal normal.  Nose: Nose normal.  Mouth/Throat: Uvula is midline, oropharynx is clear and moist and mucous membranes are normal.  Eyes: Pupils are equal, round, and reactive to  light.  Neck: Normal range of motion. Neck supple.  Cardiovascular: Normal rate and regular rhythm.   Pulmonary/Chest: Effort normal.  Abdominal: Soft. Bowel sounds are normal. There is no tenderness. There is no rigidity, no rebound and no guarding.  No signs of abdominal distention  Musculoskeletal:  No LE swelling  Neurological: He is alert.  Acting at baseline  Skin: Skin is warm and dry. No rash noted.  3.5 cm abscess to right axilla. Associated erythema and tenderness. Not actively draining any puss.  Nursing note and vitals reviewed.   ED Course  Procedures (including critical care time) Labs Review Labs Reviewed - No data to display  Imaging Review No results found. I have personally reviewed and evaluated these images and lab results as part of my medical decision-making.   EKG Interpretation None      MDM   Final diagnoses:  Abscess    INCISION AND DRAINAGE Performed by: Dorthula Matas Consent: Verbal consent obtained. Risks and benefits: risks, benefits and alternatives were discussed Type: abscess  Body area: right axilla  Anesthesia: local infiltration  Incision was made with a scalpel.  Local anesthetic: lidocaine 2 % with epinephrine  Anesthetic total: 4 ml  Complexity: complex Blunt dissection to break up loculations  Drainage: purulent  Drainage amount: moderate  Packing material: 1/4 in iodoform gauze  Patient tolerance: Patient tolerated the procedure well with no immediate complications.   Patient with skin abscess. Incision and drainage performed in the ED today.  Abscess was packed. Wound recheck in 2 days. Supportive care and return precautions discussed.  Pt sent home with Keflex and Ibuprofen. The patient appears reasonably screened and/or stabilized for discharge and I doubt any other emergent medical condition requiring further screening, evaluation, or treatment in the ED prior to discharge.      Jordan Pel,  PA-C 08/21/15 0200  Gilda Crease, MD 08/22/15 [phone]

## 2015-09-03 NOTE — ED Provider Notes (Signed)
CSN: [id number]     Arrival date & time 07/26/15  1658 History   First MD Initiated Contact with Patient 07/26/15 2019     Chief Complaint  Patient presents with  . Alcohol Intoxication     (Consider location/radiation/quality/duration/timing/severity/associated sxs/prior Treatment) Patient is a 45 y.o. male presenting with intoxication. The history is provided by the patient. No language interpreter was used.  Alcohol Intoxication This is a chronic problem. The current episode started today. The problem occurs constantly. The problem has been unchanged. Pertinent negatives include no abdominal pain. Nothing aggravates the symptoms. He has tried nothing for the symptoms. The treatment provided moderate relief.    Past Medical History  Diagnosis Date  . Alcohol abuse   . Depression   . Bipolar 1 disorder, depressed (HCC)    Past Surgical History  Procedure Laterality Date  . No past surgeries     Family History  Problem Relation Age of Onset  . Hypertension Mother   . Cancer Father   . Heart failure Father   . Alcoholism Father    Social History  Substance Use Topics  . Smoking status: Current Every Day Smoker  . Smokeless tobacco: None  . Alcohol Use: Yes     Comment: daily - "whatever I can get" - 1/5 plus daily    Review of Systems  Gastrointestinal: Negative for abdominal pain.  All other systems reviewed and are negative.     Allergies  Review of patient's allergies indicates no known allergies.  Home Medications   Prior to Admission medications   Medication Sig Start Date End Date Taking? Authorizing Provider  acamprosate (CAMPRAL) 333 MG tablet Take 2 tablets (666 mg total) by mouth 3 (three) times daily with meals. For alcoholism, Patient not taking: Reported on 07/26/2015 02/16/15   Sanjuana KavaAgnes I Nwoko, NP  cephALEXin (KEFLEX) 500 MG capsule Take 1 capsule (500 mg total) by mouth 2 (two) times daily. 08/21/15   Tiffany Neva SeatGreene, PA-C  FLUoxetine (PROZAC) 20 MG  capsule Take 1 capsule (20 mg total) by mouth daily. For depression 02/16/15   Sanjuana KavaAgnes I Nwoko, NP  gabapentin (NEURONTIN) 300 MG capsule Take 1 capsule (300 mg total) by mouth 3 (three) times daily. For agitation 02/16/15   Sanjuana KavaAgnes I Nwoko, NP  hydrocerin (EUCERIN) CREA Apply 1 application topically 2 (two) times daily. For dry skin Patient not taking: Reported on 05/08/2015 02/16/15   Sanjuana KavaAgnes I Nwoko, NP  hydrOXYzine (ATARAX/VISTARIL) 50 MG tablet Take 1 tablet (50 mg total) by mouth at bedtime as needed for anxiety (insomnia). 02/16/15   Sanjuana KavaAgnes I Nwoko, NP  ibuprofen (ADVIL,MOTRIN) 600 MG tablet Take 1 tablet (600 mg total) by mouth every 6 (six) hours as needed. 08/21/15   Tiffany Neva SeatGreene, PA-C  ibuprofen (ADVIL,MOTRIN) 800 MG tablet Take 1 tablet (800 mg total) by mouth 3 (three) times daily. 03/19/15   Tommi Rumpshonda L Summers, PA-C  OLANZapine (ZYPREXA) 10 MG tablet Take 1 tablet (10 mg total) by mouth at bedtime. For mood control 02/16/15   Sanjuana KavaAgnes I Nwoko, NP  traZODone (DESYREL) 150 MG tablet Take 1 tablet (150 mg total) by mouth at bedtime. 02/16/15   Sanjuana KavaAgnes I Nwoko, NP   BP 141/84 mmHg  Pulse 105  Temp(Src) 97.9 F (36.6 C) (Oral)  Resp 16  SpO2 96% Physical Exam  Constitutional: He is oriented to person, place, and time. He appears well-developed and well-nourished.  HENT:  Head: Normocephalic and atraumatic.  Eyes: Conjunctivae and EOM are normal. Pupils are  equal, round, and reactive to light.  Neck: Normal range of motion.  Cardiovascular: Normal rate and normal heart sounds.   Pulmonary/Chest: Effort normal.  Abdominal: He exhibits no distension.  Musculoskeletal: Normal range of motion.  Neurological: He is alert and oriented to person, place, and time.  Skin: Skin is warm.  Psychiatric: He has a normal mood and affect.  Nursing note and vitals reviewed.   ED Course  Procedures (including critical care time) Labs Review Labs Reviewed - No data to display  Imaging Review No results  found. I have personally reviewed and evaluated these images and lab results as part of my medical decision-making.   EKG Interpretation None      MDM   Final diagnoses:  Alcohol intoxication, uncomplicated [facility])        Lonia Skinner [place], PA-C 09/03/15 2052  Rolland Porter, MD 09/08/15 2258

## 2015-10-07 ENCOUNTER — Encounter (HOSPITAL_COMMUNITY): Payer: Self-pay

## 2015-10-07 ENCOUNTER — Emergency Department (HOSPITAL_COMMUNITY)
Admission: EM | Admit: 2015-10-07 | Discharge: 2015-10-07 | Disposition: A | Discharge status: Home / Self Care | Payer: Self-pay | Attending: Emergency Medicine | Admitting: Emergency Medicine

## 2015-10-07 DIAGNOSIS — F319 Bipolar disorder, unspecified: Secondary | ICD-10-CM | POA: Insufficient documentation

## 2015-10-07 DIAGNOSIS — Z792 Long term (current) use of antibiotics: Secondary | ICD-10-CM | POA: Insufficient documentation

## 2015-10-07 DIAGNOSIS — F17200 Nicotine dependence, unspecified, uncomplicated: Secondary | ICD-10-CM | POA: Insufficient documentation

## 2015-10-07 DIAGNOSIS — F10120 Alcohol abuse with intoxication, uncomplicated: Secondary | ICD-10-CM | POA: Insufficient documentation

## 2015-10-07 DIAGNOSIS — Z79899 Other long term (current) drug therapy: Secondary | ICD-10-CM | POA: Insufficient documentation

## 2015-10-07 DIAGNOSIS — Z791 Long term (current) use of non-steroidal anti-inflammatories (NSAID): Secondary | ICD-10-CM | POA: Insufficient documentation

## 2015-10-07 DIAGNOSIS — F10920 Alcohol use, unspecified with intoxication, uncomplicated: Secondary | ICD-10-CM

## 2015-10-07 NOTE — ED Notes (Signed)
Pt upset he is up for discharge. Explained need to follow up with outpatient detox program. After cooling off for a few minutes, pt decided to leave voluntarily.

## 2015-10-07 NOTE — ED Notes (Addendum)
Pt ambulatory to restroom and was given meal tray.

## 2015-10-07 NOTE — ED Notes (Signed)
Pt found intoxicated by [employer] and [employer] notifed EMS. Pt has [facility]. Pt arrives intoxicated and alert, non-combative.

## 2015-10-07 NOTE — ED Provider Notes (Signed)
CSN: [id number]     Arrival date & time 10/07/15  0246 History   By signing my name below, I, Arlan OrganAshley Leger, attest that this documentation has been prepared under the direction and in the presence of Gilda Creasehristopher J Karmon Andis, MD.  Electronically Signed: Arlan OrganAshley Leger, ED Scribe. 10/07/2015. 3:41 AM.   Chief Complaint  Patient presents with  . Alcohol Intoxication   The history is provided by the patient. No language interpreter was used.     LEVEL 5 CAVEAT- PT IS INTOXICATED   HPI Comments: Jordan Jensenodd Davidson brought in by EMS is a 45 y.o. male with a PMHx of alcohol abuse, depression, and bipolar 1 disorder who presents to the Emergency Department here for alcohol intoxication this evening. Per tiage note, pt was found by [employer] intoxicated and notified EMS.  PCP: Jacklynn BarnaclePLACEY,MARY H, NP    Past Medical History  Diagnosis Date  . Alcohol abuse   . Depression   . Bipolar 1 disorder, depressed (HCC)    Past Surgical History  Procedure Laterality Date  . No past surgeries     Family History  Problem Relation Age of Onset  . Hypertension Mother   . Cancer Father   . Heart failure Father   . Alcoholism Father    Social History  Substance Use Topics  . Smoking status: Current Every Day Smoker  . Smokeless tobacco: None  . Alcohol Use: Yes     Comment: daily - "whatever I can get" - 1/5 plus daily    Review of Systems  Unable to perform ROS: Other      Allergies  Review of patient's allergies indicates no known allergies.  Home Medications   Prior to Admission medications   Medication Sig Start Date End Date Taking? Authorizing Provider  acamprosate (CAMPRAL) 333 MG tablet Take 2 tablets (666 mg total) by mouth 3 (three) times daily with meals. For alcoholism, Patient not taking: Reported on 07/26/2015 02/16/15   Sanjuana KavaAgnes I Nwoko, NP  cephALEXin (KEFLEX) 500 MG capsule Take 1 capsule (500 mg total) by mouth 2 (two) times daily. 08/21/15   Tiffany Neva SeatGreene, PA-C  FLUoxetine (PROZAC) 20 MG  capsule Take 1 capsule (20 mg total) by mouth daily. For depression 02/16/15   Sanjuana KavaAgnes I Nwoko, NP  gabapentin (NEURONTIN) 300 MG capsule Take 1 capsule (300 mg total) by mouth 3 (three) times daily. For agitation 02/16/15   Sanjuana KavaAgnes I Nwoko, NP  hydrocerin (EUCERIN) CREA Apply 1 application topically 2 (two) times daily. For dry skin Patient not taking: Reported on 05/08/2015 02/16/15   Sanjuana KavaAgnes I Nwoko, NP  hydrOXYzine (ATARAX/VISTARIL) 50 MG tablet Take 1 tablet (50 mg total) by mouth at bedtime as needed for anxiety (insomnia). 02/16/15   Sanjuana KavaAgnes I Nwoko, NP  ibuprofen (ADVIL,MOTRIN) 600 MG tablet Take 1 tablet (600 mg total) by mouth every 6 (six) hours as needed. 08/21/15   Tiffany Neva SeatGreene, PA-C  ibuprofen (ADVIL,MOTRIN) 800 MG tablet Take 1 tablet (800 mg total) by mouth 3 (three) times daily. 03/19/15   Tommi Rumpshonda L Summers, PA-C  OLANZapine (ZYPREXA) 10 MG tablet Take 1 tablet (10 mg total) by mouth at bedtime. For mood control 02/16/15   Sanjuana KavaAgnes I Nwoko, NP  traZODone (DESYREL) 150 MG tablet Take 1 tablet (150 mg total) by mouth at bedtime. 02/16/15   Sanjuana KavaAgnes I Nwoko, NP   Triage Vitals: SpO2 95%   Physical Exam  Constitutional: He is oriented to person, place, and time. He appears well-developed and well-nourished. No distress.  HENT:  Head: Normocephalic and atraumatic.  Right Ear: Hearing normal.  Left Ear: Hearing normal.  Nose: Nose normal.  Mouth/Throat: Oropharynx is clear and moist and mucous membranes are normal.  Eyes: Conjunctivae and EOM are normal. Pupils are equal, round, and reactive to light.  Neck: Normal range of motion. Neck supple.  Cardiovascular: Normal rate, regular rhythm, S1 normal, S2 normal and normal heart sounds.  Exam reveals no gallop and no friction rub.   No murmur heard. Pulmonary/Chest: Effort normal and breath sounds normal. No respiratory distress. He exhibits no tenderness.  Abdominal: Soft. Normal appearance and bowel sounds are normal. There is no hepatosplenomegaly.  There is no tenderness. There is no rebound, no guarding, no tenderness at McBurney's point and negative Murphy's sign. No hernia.  Musculoskeletal: Normal range of motion.  Neurological: He is alert and oriented to person, place, and time. He has normal strength. No cranial nerve deficit or sensory deficit. Coordination normal. GCS eye subscore is 4. GCS verbal subscore is 5. GCS motor subscore is 6.  Skin: Skin is warm, dry and intact. No rash noted. No cyanosis.  Psychiatric: His speech is normal.  Nursing note and vitals reviewed.   ED Course  Procedures (including critical care time)  DIAGNOSTIC STUDIES: Oxygen Saturation is 95% on RA, adequate by my interpretation.    COORDINATION OF CARE: 3:38 AM-Discussed treatment plan with pt at bedside and pt agreed to plan.     Labs Review Labs Reviewed - No data to display  Imaging Review No results found. I have personally reviewed and evaluated these images and lab results as part of my medical decision-making.   EKG Interpretation None      MDM   Final diagnoses:  None  Acute alcohol intoxication  She presents to the ER, once again, for acute alcohol intoxication. Patient is without complaints upon arrival to the ER. He will be allowed to become sober and then will be reexamined, likely will be appropriate for discharge at that time.  I personally performed the services described in this documentation, which was scribed in my presence. The recorded information has been reviewed and is accurate.    Gilda Crease, MD 10/07/15 [phone]

## 2015-10-07 NOTE — Discharge Instructions (Signed)
Alcohol Intoxication Alcohol intoxication occurs when the amount of alcohol that a person has consumed impairs his or her ability to mentally and physically function. Alcohol directly impairs the normal chemical activity of the brain. Drinking large amounts of alcohol can lead to changes in mental function and behavior, and it can cause many physical effects that can be harmful.  Alcohol intoxication can range in severity from mild to very severe. Various factors can affect the level of intoxication that occurs, such as the person's age, gender, weight, frequency of alcohol consumption, and the presence of other medical conditions (such as diabetes, seizures, or heart conditions). Dangerous levels of alcohol intoxication may occur when people drink large amounts of alcohol in a short period (binge drinking). Alcohol can also be especially dangerous when combined with certain prescription medicines or "recreational" drugs. SIGNS AND SYMPTOMS Some common signs and symptoms of mild alcohol intoxication include:  Loss of coordination.  Changes in mood and behavior.  Impaired judgment.  Slurred speech. As alcohol intoxication progresses to more severe levels, other signs and symptoms will appear. These may include:  Vomiting.  Confusion and impaired memory.  Slowed breathing.  Seizures.  Loss of consciousness. DIAGNOSIS  Your health care provider will take a medical history and perform a physical exam. You will be asked about the amount and type of alcohol you have consumed. Blood tests will be done to measure the concentration of alcohol in your blood. In many places, your blood alcohol level must be lower than 80 mg/dL (9.60%0.08%) to legally drive. However, many dangerous effects of alcohol can occur at much lower levels.  TREATMENT  People with alcohol intoxication often do not require treatment. Most of the effects of alcohol intoxication are temporary, and they go away as the alcohol naturally  leaves the body. Your health care provider will monitor your condition until you are stable enough to go home. Fluids are sometimes given through an IV access tube to help prevent dehydration.  HOME CARE INSTRUCTIONS  Do not drive after drinking alcohol.  Stay hydrated. Drink enough water and fluids to keep your urine clear or pale yellow. Avoid caffeine.   Only take over-the-counter or prescription medicines as directed by your health care provider.  SEEK MEDICAL CARE IF:   You have persistent vomiting.   You do not feel better after a few days.  You have frequent alcohol intoxication. Your health care provider can help determine if you should see a substance use treatment counselor. SEEK IMMEDIATE MEDICAL CARE IF:   You become shaky or tremble when you try to stop drinking.   You shake uncontrollably (seizure).   You throw up (vomit) blood. This may be bright red or may look like black coffee grounds.   You have blood in your stool. This may be bright red or may appear as a black, tarry, bad smelling stool.   You become lightheaded or faint.  MAKE SURE YOU:   Understand these instructions.  Will watch your condition.  Will get help right away if you are not doing well or get worse.   This information is not intended to replace advice given to you by your health care provider. Make sure you discuss any questions you have with your health care provider.   Document Released: 03/22/2005 Document Revised: 02/12/2013 Document Reviewed: 11/15/2012 Elsevier Interactive Patient Education 2016 [employer] Substance Abuse Treatment Programs  Intensive Outpatient Programs [facility]     [address]  [place], [place]                   [phone]       [facility] [address] [address] [place], [place] [phone]  [facility]     (Inpatient and outpatient)     [address]  Dr[address]           [phone]    [facility] [phone] (Suboxone and Methadone)  [address]      [place], [place] [address]      [phone]       [address] [place], [place] [phone]  [facility] (Outpatient/Inpatient, Chemical)    (insurance only) [phone]             [facility] (Groups & Residential) [place], [place] [phone]     [facility]     [address]     [place], [place]      [phone]       [facility] (for caregivers and family) [phone] [address] [place], [place] [phone]      Residential Treatment Programs [facility]      [address], [place], [place] [address]  [phone]       [facility] [address], [place], [place] [address] [phone]  [facility] [place]        [phone]       [facility] [phone]  [facility] ([facility]             [address]                                         [place], [place]                                                [phone] or [phone]                               [facility] [address] [place], [address] [phone]  [facility]    [address]      [place], [place]     [phone]       [facility] [address] [place], [place] [phone]  [facility]   [address] [place], [place] [address]     [phone]      Admissions: 8am-3pm M-F  [facility] (RTS) [address] [place], [place] [phone]  [facility] Program: Residential Program [phone] Days)   [place], [place]      [phone] or [phone]     [facility]: [facility] [place], [place] (Walk in Hours over the weekend or by referral)  [facility] [address] [place], [place], [place] [address] [phone]  Crisis Mobile: Therapeutic  Alternatives:  [phone] (for crisis response 24 hours a day) [facility] Hotline:      9567226069 Outpatient Psychiatry and Counseling  Therapeutic Alternatives: Mobile Crisis Management 24 hours:  [phone]  [facility] of the [place] sliding scale fee and walk in schedule: M-F 8am-12pm/1pm-3pm [address]  [employer]  [place], [place] [address] [phone]  [facility] [address] [place], [place] [address] [phone]  [facility] ([facility] new patient walk-in appointments available Monday - Friday 8am -3pm.          [address] [place], [place] [address] [phone] or crisis line- [phone]  [facility]/ Intensive Outpatient Therapy Program [address] [place], [place] [address] [phone]  [facility]                  Crisis Services      [phone]      [address]     [place], [place] [address]                 [facility]   [facility] [phone] [address] [place], [place] [address]   [facility]          [address] [place],  [place], [place] [address]       [phone]  [facility] [address] [place], [place] [address] [phone]  [facility]    [address]    [place], [place] [address]     [phone]       Andee PolesParish McKinney, MD     [address]     [place] [address]     [phone]       [facility] [address] [place] [address]  [facility]     [address]     [place], [place]      [phone]       [facility] Eulogio DitchShamsher Ahluwalia, MD [address] [place], [place] [address] [phone]  [facility]     [address]     [place], [place]  [address]     [phone]       [facility] [address] [place], [place] [address] [phone] Resources for Temporary Residential Assistance/Crisis Centers  [facility]ENT[facility]) M-F 8am-3pm   [address] [place], [place] [address]   [phone] Services include: laundry, barbering, support groups, case management, phone  & computer access, showers, AA/NA mtgs, mental health/substance abuse nurse, job skills class, disability information, VA assistance, spiritual classes, etc.   HOMELESS SHELTERS  [facility]     [facility]   [address], [place]     [phone]              [facility] (women and children)       [address] [place], [place] [address] [phone] [email or web address] for application and process Application Required  [facility]   [address]    [place] [address]     [phone]                    [facility] [address] [place] [place], [place] [address] [phone](schedule application appt.) Application Required  [facility] (women only)    [address]     [place], [place] [address]     [phone]      Intake starts 6pm daily Need valid ID, SSC, & Police report [facility] [place] [address] [place], [place] [phone] Application  Required  [facility] (men only)     [address]      [place], [place]     [phone]       [facility] (Pregnant women only) [address] [place], [place] [phone]  [facility]      [address]      [place], [place] [address]     [phone]             [facility] [address] [place], [place] [phone] 90 day commitment/SA/Application process  [facility](men only)     [address]     [place],  [place]     [phone]       Check-in at [facility]            C[facility] [address] [place], [place] [address] [phone] Men/Women/Women and Children must be there by 7 pm  [facility] [place], [place] [phone]

## 2015-10-19 ENCOUNTER — Emergency Department (HOSPITAL_COMMUNITY)
Admission: EM | Admit: 2015-10-19 | Discharge: 2015-10-20 | Disposition: A | Discharge status: Home / Self Care | Payer: Self-pay | Attending: Emergency Medicine | Admitting: Emergency Medicine

## 2015-10-19 DIAGNOSIS — Z791 Long term (current) use of non-steroidal anti-inflammatories (NSAID): Secondary | ICD-10-CM | POA: Insufficient documentation

## 2015-10-19 DIAGNOSIS — F319 Bipolar disorder, unspecified: Secondary | ICD-10-CM | POA: Insufficient documentation

## 2015-10-19 DIAGNOSIS — Z792 Long term (current) use of antibiotics: Secondary | ICD-10-CM | POA: Insufficient documentation

## 2015-10-19 DIAGNOSIS — F10920 Alcohol use, unspecified with intoxication, uncomplicated: Secondary | ICD-10-CM

## 2015-10-19 DIAGNOSIS — F17200 Nicotine dependence, unspecified, uncomplicated: Secondary | ICD-10-CM | POA: Insufficient documentation

## 2015-10-19 DIAGNOSIS — F10120 Alcohol abuse with intoxication, uncomplicated: Secondary | ICD-10-CM | POA: Insufficient documentation

## 2015-10-19 NOTE — ED Notes (Signed)
Pt BIB EMS from the parking lot of [employer]. Pt unable to stand or ambulate unassisted. Pt urinated in his pants. Pt would not give us an amount of alcohol ingested.

## 2015-10-19 NOTE — ED Notes (Signed)
Bed: [facility] Expected date:  Expected time:  Means of arrival:  Comments: ETOH 

## 2015-10-19 NOTE — ED Notes (Signed)
MD at bedside. 

## 2015-10-19 NOTE — ED Provider Notes (Signed)
CSN: [id number]     Arrival date & time 10/19/15  2247 History  By signing my name below, I, Bethel Born, attest that this documentation has been prepared under the direction and in the presence of Devoria Albe, MD at 23:28 PM. Electronically Signed: Bethel Born, ED Scribe. 10/20/2015. 12:10 AM    Chief Complaint  Patient presents with  . Alcohol Intoxication   Level V caveat due to intoxication   The history is provided by the patient. No language interpreter was used.   Brought in by EMS, Jordan Davidson is a 45 y.o. male with PMHx of alcohol abuse, depression, and bipolar I disorder who presents to the Emergency Department for alcohol intoxication. Per EMS, pt was found in a parking lot of a [employer] store. He has a history of frequent ED visits for being intoxicated.     Past Medical History  Diagnosis Date  . Alcohol abuse   . Depression   . Bipolar 1 disorder, depressed (HCC)    Past Surgical History  Procedure Laterality Date  . No past surgeries     Family History  Problem Relation Age of Onset  . Hypertension Mother   . Cancer Father   . Heart failure Father   . Alcoholism Father    Social History  Substance Use Topics  . Smoking status: Current Every Day Smoker  . Smokeless tobacco: Not on file  . Alcohol Use: Yes     Comment: daily - "whatever I can get" - 1/5 plus daily    Review of Systems  Unable to perform ROS: Other    Allergies  Review of patient's allergies indicates no known allergies.  Home Medications   Prior to Admission medications   Medication Sig Start Date End Date Taking? Authorizing Provider  acamprosate (CAMPRAL) 333 MG tablet Take 2 tablets (666 mg total) by mouth 3 (three) times daily with meals. For alcoholism, Patient not taking: Reported on 07/26/2015 02/16/15   Sanjuana Kava, NP  cephALEXin (KEFLEX) 500 MG capsule Take 1 capsule (500 mg total) by mouth 2 (two) times daily. 08/21/15   Tiffany Neva Seat, PA-C  FLUoxetine (PROZAC) 20 MG  capsule Take 1 capsule (20 mg total) by mouth daily. For depression 02/16/15   Sanjuana Kava, NP  gabapentin (NEURONTIN) 300 MG capsule Take 1 capsule (300 mg total) by mouth 3 (three) times daily. For agitation 02/16/15   Sanjuana Kava, NP  hydrocerin (EUCERIN) CREA Apply 1 application topically 2 (two) times daily. For dry skin Patient not taking: Reported on 05/08/2015 02/16/15   Sanjuana Kava, NP  hydrOXYzine (ATARAX/VISTARIL) 50 MG tablet Take 1 tablet (50 mg total) by mouth at bedtime as needed for anxiety (insomnia). 02/16/15   Sanjuana Kava, NP  ibuprofen (ADVIL,MOTRIN) 600 MG tablet Take 1 tablet (600 mg total) by mouth every 6 (six) hours as needed. 08/21/15   Tiffany Neva Seat, PA-C  ibuprofen (ADVIL,MOTRIN) 800 MG tablet Take 1 tablet (800 mg total) by mouth 3 (three) times daily. 03/19/15   Tommi Rumps, PA-C  OLANZapine (ZYPREXA) 10 MG tablet Take 1 tablet (10 mg total) by mouth at bedtime. For mood control 02/16/15   Sanjuana Kava, NP  traZODone (DESYREL) 150 MG tablet Take 1 tablet (150 mg total) by mouth at bedtime. 02/16/15   Sanjuana Kava, NP   BP 110/60 mmHg  Pulse 114  Temp(Src) 98.3 F (36.8 C) (Oral)  Resp 13  SpO2 94%  Vital signs normal except tachycardia  Physical Exam  Constitutional: He appears well-developed and well-nourished.  Non-toxic appearance. He does not appear ill. No distress.  He barely opens eyes to verbal and physical stimulation. Pt Non-verbal.   HENT:  Head: Normocephalic and atraumatic.  Right Ear: External ear normal.  Left Ear: External ear normal.  Nose: Nose normal. No mucosal edema or rhinorrhea.  Mouth/Throat: Mucous membranes are normal. No dental abscesses or uvula swelling.  No obvious trauma to head  Eyes: Conjunctivae and EOM are normal. Pupils are equal, round, and reactive to light.  Neck: Normal range of motion and full passive range of motion without pain.  Cardiovascular: Normal rate, regular rhythm and normal heart sounds.  Exam  reveals no gallop and no friction rub.   No murmur heard. Pulmonary/Chest: Effort normal and breath sounds normal. No respiratory distress. He has no wheezes. He has no rhonchi. He has no rales. He exhibits no tenderness and no crepitus.  Abdominal: Soft. Normal appearance and bowel sounds are normal. He exhibits no distension. There is no tenderness. There is no rebound and no guarding.  Musculoskeletal: Normal range of motion. He exhibits no edema or tenderness.  Moves all extremities well.   Neurological: He has normal strength. No cranial nerve deficit.  Skin: Skin is warm, dry and intact. No rash noted. No erythema. No pallor.  Psychiatric: He is slowed. He is noncommunicative.  Nursing note and vitals reviewed.   ED Course  Procedures (including critical care time) DIAGNOSTIC STUDIES: Oxygen Saturation is 94% on RA,  normal by my interpretation.    COORDINATION OF CARE: 2:38 AM I revaluated the pt, who is now awake,  Asking for food, has an empty sandwich container on his bedside table, informed he already ate, states he wants more. I talked to the patient about why he's not followed up at the outpatient facilities for alcohol detox. He states "it takes too long". I reminded him that he has burned his bridges at the inpatient facilities that we normally use to admit people for detox. At which point he says some unpleasant words and then goes back to sleep.  Patient remained sleeping during most of his ED visit. Nurse reports he's been to the bathroom and that he was still unsteady.   MDM   Final diagnoses:  Alcohol intoxication, uncomplicated (HCC)    Plan discharge  Devoria AlbeIva Shep Porter, MD, FACEP   I personally performed the services described in this documentation, which was scribed in my presence. The recorded information has been reviewed and considered.  Devoria AlbeIva Yazhini Mcaulay, MD, Concha PyoFACEP    Breane Grunwald, MD 10/20/15 [phone]

## 2015-10-20 ENCOUNTER — Encounter (HOSPITAL_COMMUNITY): Payer: Self-pay | Admitting: Nurse Practitioner

## 2015-10-20 NOTE — ED Notes (Addendum)
Per MD pt has care plan and should not be given any food or drinks until patient is able to be sober up and ambulate with no problems.

## 2015-10-20 NOTE — Discharge Instructions (Signed)
Community Resource Guide Outpatient Counseling/Substance Abuse Adult °The United Way’s “211” is a great source of information about community services available.  Access by dialing 2-1-1 from anywhere in [place], or by website -  www.nc211.org.  ° °Other Local Resources (Updated 06/2015) ° °Crisis Hotlines °  °Services  ° °  °Area Served  °[facility] • Crisis Hotline, available 24 hours a day, 7 days a week: 800-939-5911 [place], [place]  ° [facility] • Crisis Hotline, available 24 hours a day, 7 days a week: 866-275-9552 [place], [place]  °[facility] • Suicide Prevention Hotline, available 24 hours a day, 7 days a week: 800-273-8255 [place], [place]  °[facility] ° • Crisis Hotline, available 24 hours a day, 7 days a week: 336-676-6840 [place], [place] °  °[facility] • Crisis Hotline, available 24 hours a day, 7 days a week: 800-256-2452 All °  °[facility] • Crisis Hotline, available 24 hours a day, 7 days a week: 877-626-1772 All  ° °Other Local Resources (Updated 06/2015) ° °Outpatient Counseling/ Substance Abuse Programs  °Services  ° °  °Address and Phone Number  °[facility] ° • Options include Individual counseling, group counseling, intensive outpatient program (several hours a day, several days a week) °• Offers depression assessments °• Provides methadone maintenance program 336-333-6860 °[address], [address] °[place], [place] [address] °  °[facility] ° • Offers partial hospitalization/day treatment and DUI/DWI programs °• Accepts Medicare, private insurance 336-299-4655 °[address], [address] °[place], [place] [address]  °[facility] ° ° • Services include intensive outpatient program (several hours a day, several days a week), outpatient treatment, DUI/DWI services, family education °• Also has some services specifically for Veterans °• Offers transitional housing   336-886-5594 °[address] °[place], [place] [address] °  °  °[facility] • Accepts Medicare, private pay, and private insurance 336-272-0855 °[address] °[place], [place] [address]  °[facility] • Services include individual counseling, substance abuse intensive outpatient program (several hours a day, several days a week), day treatment °• Accepts Medicare, Medicaid, private insurance 336-271-5888 °[address] °[place], [place] [address]  °[facility] ° • Offers substance abuse intensive outpatient program (several hours a day, several days a week), partial hospitalization program 336-832-9800 °[address] °[place], [place] [address] ° °336-349-4454 °[address] °[place], [place] [address] ° °336-386-3795 °[address] °[place], [place] [address] ° °336-993-6120 °[address] °[place], [place] [address]  °[facility] • Individual counseling only °• Accepts private insurance only 336-292-1510 °[address] °[place], [place] [address]  °[facility] • Methadone maintenance program 800-805-6989 °[address] °[place], [place] [address]  °[facility] • Walk-In Clinic providing substance abuse and mental health counseling °• Accepts Medicaid, Medicare, private insurance °• Offers sliding scale for uninsured 336-342-8316 °[address] °[place], [place]   °[facility] • Offers individual counseling, and intensive in-home services 336-347-7415 °[address] °, [place] [address]  °[facility] • Offers individual counseling, family counseling, group therapy, domestic violence counseling, consumer credit counseling °• Accepts Medicare, Medicaid, private insurance °• Offers sliding scale for uninsured 336-387-6161 °[address] °[place], [place] [address] ° °336-889-6161 °[place], [address] °[place],  [address]  °[facility] • Offers individual, family   and group counseling °• 3 locations - [place], [place], and [place] ° 336-899-8800 ° °[address] °[place], [place] [address] ° °[address] °[place], [place] [address] ° °[address] °[place], [place] [address]  °[facility]  ° • Offers psychiatric assessment, 8-week Intensive Outpatient Program (several hours a day, several times a week, daytime or evenings), early recovery group, family Program, medication management °• Private pay or private insurance only 336 -621-3381, or  °800-659-3381 °[address] °[place], [place] [address]  °[facility] • Offers individual, couples and family counseling °• Accepts Medicaid, private insurance, and sliding scale for uninsured 336-542-2076 °[address] °[place], [place] [address]  °David Fuller, [facility] • Individual counseling °• Private insurance 336-852-4051 °[address] °[place], [place] [address]  °[facility] ° • Offers assessment, substance abuse treatment, and behavioral health treatment 336-878-6098 °[address] °[place], [place] [address]  °[facility] • Individual counseling °• Accepts private insurance 336-272-1972 °[address] °[place], [place] [address]  °[facility] • Individual counseling °• Accepts Medicare, private insurance 336-547-1574 °[address] °[place], [place] [address]  °[facility]  ° • Offers intensive outpatient program (several hours a day, several times a week) °• Private pay, private insurance [phone] °[address] °[place], [place]  °[facility] • Individual counseling °• Medicare, private insurance 336-505-9494 °[address] °[place], [place] [address]  °[facility]  ° • Offers intensive outpatient program (several hours a day, several times a week) and partial hospitalization  program 336-794-3550 °[address] °[place], [place] [address]  °Parrish McKinney, MD • Individual counseling 336-282-1251 °[address] °[place], [place] [address]  °[facility] • Offers Christian counseling to individuals, couples, and families °• Accepts Medicare and private insurance; offers sliding scale for uninsured 336-288-1484 °[address] °[place], [place] [address]  °[facility] • Christian counseling 336-542-2060 °[address] °[place], [place] [address]  °[facility] ° • Offers crisis counseling, individual counseling, group therapy, in-home therapy, domestic violence services, day treatment, DWI services, Community Support Team (CST), Assertive Community Treatment Team (ACTT), substance abuse Intensive Outpatient Program (several hours a day, several times a week) °• 2 locations - [place] and [place] 336-229-5905 °[address] °[place], [place] [address] ° °336-694-1777 °[address] °[place], [place] [address]  °[facility]  ° ° • Individual counseling and group therapy °• Accepts private insurance, Medicare, Medicaid 336-379-7146 °[address] °[place], [place]  °[facility] • Offers individual and family counseling °• Offers LGBTQ services °• Accepts private insurance and private pay 336-288-9190 °[address] °[place], [place] [address]  °[facility]  ° • Offers individual counseling, group therapy, and outpatient detox °• Accepts private insurance 336-389-1413 °[address] °[place], [place]  °[facility] • Individual counseling °• Accepts Medicare, private insurance 336-632-3505 °[address] °, [place] [address]  °[facility] • Individual counseling °• Accepts Medicare, private insurance 336-570-0104 °[address] °[place],  [address]  °[facility] ° • Offers substance abuse  Intensive Outpatient Program (several hours a day, several times a week) [phone], or °[phone] °,   ° °

## 2015-10-24 ENCOUNTER — Emergency Department (HOSPITAL_COMMUNITY): Payer: Self-pay

## 2015-10-24 ENCOUNTER — Emergency Department (HOSPITAL_COMMUNITY)
Admission: EM | Admit: 2015-10-24 | Discharge: 2015-10-24 | Disposition: A | Discharge status: Home / Self Care | Payer: Self-pay | Attending: Emergency Medicine | Admitting: Emergency Medicine

## 2015-10-24 ENCOUNTER — Encounter (HOSPITAL_COMMUNITY): Payer: Self-pay | Admitting: *Deleted

## 2015-10-24 DIAGNOSIS — Z791 Long term (current) use of non-steroidal anti-inflammatories (NSAID): Secondary | ICD-10-CM | POA: Insufficient documentation

## 2015-10-24 DIAGNOSIS — F17200 Nicotine dependence, unspecified, uncomplicated: Secondary | ICD-10-CM | POA: Insufficient documentation

## 2015-10-24 DIAGNOSIS — F1020 Alcohol dependence, uncomplicated: Secondary | ICD-10-CM | POA: Insufficient documentation

## 2015-10-24 DIAGNOSIS — K2920 Alcoholic gastritis without bleeding: Secondary | ICD-10-CM | POA: Insufficient documentation

## 2015-10-24 DIAGNOSIS — R000 Tachycardia, unspecified: Secondary | ICD-10-CM | POA: Insufficient documentation

## 2015-10-24 DIAGNOSIS — F319 Bipolar disorder, unspecified: Secondary | ICD-10-CM | POA: Insufficient documentation

## 2015-10-24 DIAGNOSIS — Z79899 Other long term (current) drug therapy: Secondary | ICD-10-CM | POA: Insufficient documentation

## 2015-10-24 DIAGNOSIS — Z792 Long term (current) use of antibiotics: Secondary | ICD-10-CM | POA: Insufficient documentation

## 2015-10-24 LAB — BASIC METABOLIC PANEL
ANION GAP: 17 — AB (ref 5–15)
BUN: 13 mg/dL (ref 6–20)
CALCIUM: 8.6 mg/dL — AB (ref 8.9–10.3)
CO2: 24 mmol/L (ref 22–32)
Chloride: 101 mmol/L (ref 101–111)
Creatinine, Ser: 0.84 mg/dL (ref 0.61–1.24)
Glucose, Bld: 174 mg/dL — ABNORMAL HIGH (ref 65–99)
POTASSIUM: 4 mmol/L (ref 3.5–5.1)
SODIUM: 142 mmol/L (ref 135–145)

## 2015-10-24 LAB — I-STAT TROPONIN, ED: TROPONIN I, POC: 0.01 ng/mL (ref 0.00–0.08)

## 2015-10-24 LAB — CBC
HEMATOCRIT: 40.3 % (ref 39.0–52.0)
HEMOGLOBIN: 13.9 g/dL (ref 13.0–17.0)
MCH: 31.9 pg (ref 26.0–34.0)
MCHC: 34.5 g/dL (ref 30.0–36.0)
MCV: 92.4 fL (ref 78.0–100.0)
Platelets: 82 10*3/uL — ABNORMAL LOW (ref 150–400)
RBC: 4.36 MIL/uL (ref 4.22–5.81)
RDW: 16.4 % — ABNORMAL HIGH (ref 11.5–15.5)
WBC: 5.9 10*3/uL (ref 4.0–10.5)

## 2015-10-24 MED ORDER — PANTOPRAZOLE SODIUM 20 MG PO TBEC: 20.0000 mg | DELAYED_RELEASE_TABLET | Freq: Every day | ORAL | Status: DC

## 2015-10-24 MED ORDER — LORAZEPAM 1 MG PO TABS
0.0000 mg | ORAL_TABLET | Freq: Four times a day (QID) | ORAL | Status: DC
  Administered 2015-10-24: 1 mg via ORAL
  Filled 2015-10-24: qty 1

## 2015-10-24 MED ORDER — GI COCKTAIL ~~LOC~~
30.0000 mL | Freq: Once | ORAL | Status: AC
  Administered 2015-10-24: 30 mL via ORAL
  Filled 2015-10-24: qty 30

## 2015-10-24 MED ORDER — LORAZEPAM 1 MG PO TABS: 0.0000 mg | ORAL_TABLET | Freq: Two times a day (BID) | ORAL | Status: DC

## 2015-10-24 NOTE — ED Provider Notes (Signed)
CSN: [id number]     Arrival date & time 10/24/15  1804 History   First MD Initiated Contact with Patient 10/24/15 1958     Chief Complaint  Patient presents with  . Chest Pain  . Alcohol Intoxication     (Consider location/radiation/quality/duration/timing/severity/associated sxs/prior Treatment) HPI Comments: 45 year old male with a history of frequent ED visits for alcohol intoxication, alcohol abuse and dependence, depression, and bipolar 1 disorder presents to the emergency department for c/o chest pain. He was transported by EMS after he was found sleeping at [facility]. He states that his pain is in his central chest and upper abdomen. He reports similar pain in the past associated with drinking alcohol. He cannot recall how much he drank today, but states he usually drinks beer or liquor. Patient with no SI/HI. He denies additional complaints today and is requesting Ativan.  Patient is a 45 y.o. male presenting with chest pain and intoxication. The history is provided by the patient. No language interpreter was used.  Chest Pain Alcohol Intoxication Associated symptoms include chest pain.    Past Medical History  Diagnosis Date  . Alcohol abuse   . Depression   . Bipolar 1 disorder, depressed (HCC)    Past Surgical History  Procedure Laterality Date  . No past surgeries     Family History  Problem Relation Age of Onset  . Hypertension Mother   . Cancer Father   . Heart failure Father   . Alcoholism Father    Social History  Substance Use Topics  . Smoking status: Current Every Day Smoker  . Smokeless tobacco: None  . Alcohol Use: Yes     Comment: daily - "whatever I can get" - 1/5 plus daily    Review of Systems  Cardiovascular: Positive for chest pain.  Psychiatric/Behavioral: Positive for behavioral problems.  Ten systems reviewed and are negative for acute change, except as noted in the HPI.    Allergies  Review of patient's allergies  indicates no known allergies.  Home Medications   Prior to Admission medications   Medication Sig Start Date End Date Taking? Authorizing Provider  acamprosate (CAMPRAL) 333 MG tablet Take 2 tablets (666 mg total) by mouth 3 (three) times daily with meals. For alcoholism, Patient not taking: Reported on 07/26/2015 02/16/15   Sanjuana Kava, NP  cephALEXin (KEFLEX) 500 MG capsule Take 1 capsule (500 mg total) by mouth 2 (two) times daily. 08/21/15   Tiffany Neva Seat, PA-C  FLUoxetine (PROZAC) 20 MG capsule Take 1 capsule (20 mg total) by mouth daily. For depression 02/16/15   Sanjuana Kava, NP  gabapentin (NEURONTIN) 300 MG capsule Take 1 capsule (300 mg total) by mouth 3 (three) times daily. For agitation 02/16/15   Sanjuana Kava, NP  hydrocerin (EUCERIN) CREA Apply 1 application topically 2 (two) times daily. For dry skin Patient not taking: Reported on 05/08/2015 02/16/15   Sanjuana Kava, NP  hydrOXYzine (ATARAX/VISTARIL) 50 MG tablet Take 1 tablet (50 mg total) by mouth at bedtime as needed for anxiety (insomnia). 02/16/15   Sanjuana Kava, NP  ibuprofen (ADVIL,MOTRIN) 600 MG tablet Take 1 tablet (600 mg total) by mouth every 6 (six) hours as needed. 08/21/15   Tiffany Neva Seat, PA-C  ibuprofen (ADVIL,MOTRIN) 800 MG tablet Take 1 tablet (800 mg total) by mouth 3 (three) times daily. 03/19/15   Tommi Rumps, PA-C  OLANZapine (ZYPREXA) 10 MG tablet Take 1 tablet (10 mg total) by mouth at  bedtime. For mood control 02/16/15   Sanjuana Kava, NP  traZODone (DESYREL) 150 MG tablet Take 1 tablet (150 mg total) by mouth at bedtime. 02/16/15   Sanjuana Kava, NP   BP 125/85 mmHg  Pulse 107  Temp(Src) 98.2 F (36.8 C) (Oral)  Resp 18  SpO2 93%   Physical Exam  Constitutional: He is oriented to person, place, and time. He appears well-developed and well-nourished. No distress.  Nontoxic appearing. Patient in no distress.  HENT:  Head: Normocephalic and atraumatic.  Eyes: Conjunctivae and EOM are normal. No  scleral icterus.  Neck: Normal range of motion.  Cardiovascular: Regular rhythm and intact distal pulses.   Mild tachycardia; HR 96-102bpm  Pulmonary/Chest: Effort normal and breath sounds normal. No respiratory distress. He has no wheezes. He has no rales.  Respirations even and unlabored. Lungs CTAB.  Abdominal: Soft. He exhibits no distension. There is no tenderness. There is no rebound.  Soft, nontender abdomen. No masses.  Musculoskeletal: Normal range of motion.  Neurological: He is alert and oriented to person, place, and time. He exhibits normal muscle tone. Coordination normal.  Skin: Skin is warm and dry. No rash noted. He is not diaphoretic. No erythema. No pallor.  Psychiatric: He has a normal mood and affect. His behavior is normal.  Nursing note and vitals reviewed.   ED Course  Procedures (including critical care time) Labs Review Labs Reviewed  BASIC METABOLIC PANEL - Abnormal; Notable for the following:    Glucose, Bld 174 (*)    Calcium 8.6 (*)    Anion gap 17 (*)    All other components within normal limits  CBC - Abnormal; Notable for the following:    RDW 16.4 (*)    Platelets 82 (*)    All other components within normal limits  I-STAT TROPOININ, ED    Imaging Review Dg Chest 2 View  10/24/2015  CLINICAL DATA:  Chest pain EXAM: CHEST  2 VIEW COMPARISON:  03/19/2015 FINDINGS: Lungs are clear.  No pleural effusion or pneumothorax. The heart is normal in size. Visualized osseous structures are within normal limits. IMPRESSION: No evidence of acute cardiopulmonary disease. Electronically Signed   By: Charline Bills M.D.   On: 10/24/2015 18:58   I have personally reviewed and evaluated these images and lab results as part of my medical decision-making.   EKG Interpretation   Date/Time:  Sunday October 24 2015 18:10:09 EDT Ventricular Rate:  107 PR Interval:  152 QRS Duration: 92 QT Interval:  346 QTC Calculation: 461 R Axis:   98 Text Interpretation:   Sinus tachycardia Rightward axis Cannot rule out  Anterior infarct , age undetermined Abnormal ECG since last tracing no  significant change Confirmed by Effie Shy  MD, ELLIOTT [phone]) on 10/24/2015  8:23:39 PM      8:19 PM Patient ambulatory to the bathroom without difficulty.  MDM   Final diagnoses:  Alcoholic gastritis  Alcohol use disorder, severe, dependence (HCC)    45 year old male with history of alcohol abuse and dependence, bipolar disorder and depression presents to the emergency department After drinking excessively today. He states that he is having discomfort in his central chest and upper abdomen consistent with pain he's had in the past associated with drinking. Patient with a nonischemic EKG and a reassuring cardiac workup. He has a heart score of 2-3 depending upon level of suspicion, both c/w low risk of acute coronary event. Highly suspect alcoholic gastritis as cause of discomfort. Patient  given GI cocktail. He has also been administered Ativan per CIWA score.   Patient has been up and ambulatory to the bathroom without difficulty. His speech is clear and he appears clinically sober at this time. He is requesting to go outside and smoke a cigarette. I see no indication for further emergent workup. Patient discharged in satisfactory condition. This is his 3rd ED visit in the past 2 weeks and 11th visit in the last 6 months.   Filed Vitals:   10/24/15 1808 10/24/15 2028  BP: 125/85 121/78  Pulse: 107 94  Temp: 98.2 F (36.8 C)   TempSrc: Oral   Resp: 18   SpO2: 93%      Antony MaduraKelly Pacey Altizer, PA-C 10/24/15 2050  Mancel BaleElliott Wentz, MD 10/24/15 2315

## 2015-10-24 NOTE — Discharge Instructions (Signed)
Gastritis, Adult Gastritis is soreness and swelling (inflammation) of the lining of the stomach. Gastritis can develop as a sudden onset (acute) or long-term (chronic) condition. If gastritis is not treated, it can lead to stomach bleeding and ulcers. CAUSES  Gastritis occurs when the stomach lining is weak or damaged. Digestive juices from the stomach then inflame the weakened stomach lining. The stomach lining may be weak or damaged due to viral or bacterial infections. One common bacterial infection is the Helicobacter pylori infection. Gastritis can also result from excessive alcohol consumption, taking certain medicines, or having too much acid in the stomach.  SYMPTOMS  In some cases, there are no symptoms. When symptoms are present, they may include:  Pain or a burning sensation in the upper abdomen.  Nausea.  Vomiting.  An uncomfortable feeling of fullness after eating. DIAGNOSIS  Your caregiver may suspect you have gastritis based on your symptoms and a physical exam. To determine the cause of your gastritis, your caregiver may perform the following:  Blood or stool tests to check for the H pylori bacterium.  Gastroscopy. A thin, flexible tube (endoscope) is passed down the esophagus and into the stomach. The endoscope has a light and camera on the end. Your caregiver uses the endoscope to view the inside of the stomach.  Taking a tissue sample (biopsy) from the stomach to examine under a microscope. TREATMENT  Depending on the cause of your gastritis, medicines may be prescribed. If you have a bacterial infection, such as an H pylori infection, antibiotics may be given. If your gastritis is caused by too much acid in the stomach, H2 blockers or antacids may be given. Your caregiver may recommend that you stop taking aspirin, ibuprofen, or other nonsteroidal anti-inflammatory drugs (NSAIDs). HOME CARE INSTRUCTIONS  Only take over-the-counter or prescription medicines as directed by  your caregiver.  If you were given antibiotic medicines, take them as directed. Finish them even if you start to feel better.  Drink enough fluids to keep your urine clear or pale yellow.  Avoid foods and drinks that make your symptoms worse, such as:  Caffeine or alcoholic drinks.  Chocolate.  Peppermint or mint flavorings.  Garlic and onions.  Spicy foods.  Citrus fruits, such as oranges, lemons, or limes.  Tomato-based foods such as sauce, chili, salsa, and pizza.  Fried and fatty foods.  Eat small, frequent meals instead of large meals. SEEK IMMEDIATE MEDICAL CARE IF:   You have black or dark red stools.  You vomit blood or material that looks like coffee grounds.  You are unable to keep fluids down.  Your abdominal pain gets worse.  You have a fever.  You do not feel better after 1 week.  You have any other questions or concerns. MAKE SURE YOU:  Understand these instructions.  Will watch your condition.  Will get help right away if you are not doing well or get worse.   This information is not intended to replace advice given to you by your health care provider. Make sure you discuss any questions you have with your health care provider.   Document Released: 06/06/2001 Document Revised: 12/12/2011 Document Reviewed: 07/26/2011 Elsevier Interactive Patient Education 2016 [employer].  [employer] Guide Outpatient Counseling/Substance Abuse Adult The United Ways 211 is a great source of information about community services available.  Access by dialing 2-1-1 from anywhere in [place], or by website -  PooledIncome.pl.   Other Local Resources (Updated 06/2015)  Crisis Hotlines  Services     Area International PaperServed  [facility]  Crisis Hotline, available 24 hours a day, 7 days a week: [phone] [facility] [place], [place]   [facility]  Crisis Hotline, available 24 hours a day, 7 days a week: [phone]  [facility], [place]  [facility]  Suicide Prevention Hotline, available 24 hours a day, 7 days a week: [phone] [facility], [place]  [facility]   Crisis Hotline, available 24 hours a day, 7 days a week: [phone] [facility], [place]   [facility] Access to [employer] Line  Crisis Hotline, available 24 hours a day, 7 days a week: [phone] [phone] All   [facility]  Crisis Hotline, available 24 hours a day, 7 days a week: [phone] All   Other Local Resources (Updated 06/2015)  Outpatient Counseling/ Substance Abuse Programs  Services     Address and Phone Number  ADS (Alcohol and Drug Services)   Options include Individual counseling, group counseling, intensive outpatient program (several hours a day, several days a week)  Offers depression assessments  Provides methadone maintenance program [phone] [address], [address] [place], [place] [address]   [facility]   Offers partial hospitalization/day treatment and DUI/DWI programs  [employer]s Medicare, private insurance [phone] [address], [address] [place], [place] [address]  [facility]    Services include intensive outpatient program (several hours a day, several days a week), outpatient treatment, DUI/DWI services, family education  Also has some services specifically for [employer]  Offers transitional housing  [phone] [address] [place], [place] [address]     [facility]  [employer]cepts Medicare, private pay, and private insurance [phone] [address], [address] [place], [place] [address]  [facility]  Services include individual counseling, substance abuse intensive outpatient program (several hours a day, several days a week), day treatment  Delene Lollccepts Medicare, Medicaid, private insurance [phone] [address], [address] [place], [place] [address]  [facility]    Offers substance abuse intensive outpatient program (several hours a day, several days a week), partial hospitalization program [phone] [address] [place], [place] [address]  [phone] [address] [place], [place] [address]  [phone] [address] [place], [place] [address]  [phone] [place] [address] [place], [place] [address]  [facility]  Individual counseling only  Accepts private insurance only [phone] [address], [address] [place], [place] [address]  [facility]  Methadone maintenance program [phone] [address] [place], [place] [address]  [facility]  Walk-In Clinic providing substance abuse and mental health counseling  Accepts Medicaid, Medicare, private insurance  Offers sliding scale for uninsured [phone] [address] [place], [place]   [facility] [employer]  Offers individual counseling, and intensive in-home services [phone] [address], [address] [place], [place] [address]  [facility]  Offers individual counseling, family counseling, group therapy, domestic violence counseling, consumer credit counseling  Accepts Medicare, Medicaid, private insurance  Offers sliding scale for uninsured [phone] [address] [place], [place] [address]  [phone] [facility], [address] [place], [place] [address]  [facility]  Offers individual, family and group counseling  3 locations - [place], [place], and [place]  [phone]  [address] [place], [place] [address]  [address] [place], [place] [address]  [address] [place], [place] [address]  [facility]    Offers psychiatric assessment, 8-week Intensive Outpatient Program (  several hours a day, several times a week, daytime or evenings), early recovery group, family Program, medication management  Private pay or private insurance only [phone], or  [phone] [address] [place], [place] [address]  [facility]  Offers individual, couples and family counseling  Accepts Medicaid, private insurance, and sliding scale for uninsured [phone] [address] [place], [place] [address]  Len Blalock, MD  Individual counseling  Private insurance [phone] [address] [place], [place] [address]  [facility]   Offers assessment, substance abuse treatment, and behavioral health treatment [phone] [place], [place] [address]  [facility]  Individual counseling  Accepts private insurance [phone] [address] [place], [place] [address]  [facility]  Individual counseling  Delene Loll, private insurance [phone] [address] [place], [place] [address]  [facility]    Offers intensive outpatient program (several hours a day, several times a week)  Private pay, private insurance [phone] [facility] [place], [place]  [facility]  Individual counseling  Medicare, private insurance [phone] [address] [place], [place] [address]  [facility]    Offers intensive outpatient program (several hours a day, several times a week) and partial hospitalization program [phone] [address] [place], [place] [address]  Emerson Monte, MD  Individual counseling [phone] [address], [address] [place], [place] [address]  [facility]  Offers Christian counseling to individuals, couples, and families  Accepts Medicare and private insurance; offers sliding scale for uninsured [phone] [address] [place], [place] [address]  [facility]  [place] counseling [phone] [address] [place], [place] [address]  [facility] [employer] crisis counseling, individual counseling, group therapy, in-home therapy, domestic violence services, day treatment, DWI services, Administrator, arts (CST), Assertive Community Treatment Team (ACTT), substance abuse Intensive Outpatient Program (several hours a day, several times a week)  2 locations - [place] and [place] [phone] [address] [place], [place] [address]  [phone] [address] [place], [place] [address]  [facility]     Individual counseling and group therapy  Accepts private insurance, [place], [place] [phone] [address] [place], [place]  [facility]  Offers individual and family counseling  Offers LGBTQ services  Accepts private insurance and private pay [phone] [address] [place], [place] [address]  [facility]    Offers individual counseling, group therapy, and outpatient detox  Accepts private insurance [phone] [address] [place], [place]  [facility]  Individual counseling  Accepts Medicare, private insurance [phone] [address] [place], [place] [address]  [employer]  Individual counseling  Accepts Medicare, private insurance [phone] [address] [place], [place] [address]  [facility]   Offers substance abuse Intensive Outpatient Program (several hours a day, several times a week) [phone], or [phone] [place], [place]

## 2015-10-24 NOTE — ED Notes (Signed)
Pt arrived  Via ambulance with complaints of chest pain and admits to drinking ETOH since noon and was found on the ground sleeping at [employer].  No SI or HI. Removed tick from right abdomen and placed in cup.

## 2015-10-26 ENCOUNTER — Encounter (HOSPITAL_COMMUNITY): Payer: Self-pay | Admitting: Emergency Medicine

## 2015-10-26 ENCOUNTER — Emergency Department (HOSPITAL_COMMUNITY)
Admission: EM | Admit: 2015-10-26 | Discharge: 2015-10-27 | Discharge status: Against Medical Advice | Payer: Self-pay | Attending: Emergency Medicine | Admitting: Emergency Medicine

## 2015-10-26 DIAGNOSIS — Z79899 Other long term (current) drug therapy: Secondary | ICD-10-CM | POA: Insufficient documentation

## 2015-10-26 DIAGNOSIS — Z792 Long term (current) use of antibiotics: Secondary | ICD-10-CM | POA: Insufficient documentation

## 2015-10-26 DIAGNOSIS — F319 Bipolar disorder, unspecified: Secondary | ICD-10-CM | POA: Insufficient documentation

## 2015-10-26 DIAGNOSIS — Z791 Long term (current) use of non-steroidal anti-inflammatories (NSAID): Secondary | ICD-10-CM | POA: Insufficient documentation

## 2015-10-26 DIAGNOSIS — F10920 Alcohol use, unspecified with intoxication, uncomplicated: Secondary | ICD-10-CM

## 2015-10-26 DIAGNOSIS — F10120 Alcohol abuse with intoxication, uncomplicated: Secondary | ICD-10-CM | POA: Insufficient documentation

## 2015-10-26 DIAGNOSIS — F17200 Nicotine dependence, unspecified, uncomplicated: Secondary | ICD-10-CM | POA: Insufficient documentation

## 2015-10-26 NOTE — ED Notes (Signed)
[facility] EMS called to medically clear patient for processing, pt unable to answer questions appropriately and reports drinking a gallon of Vodka today.

## 2015-10-27 LAB — COMPREHENSIVE METABOLIC PANEL
ALBUMIN: 4.2 g/dL (ref 3.5–5.0)
ALK PHOS: 96 U/L (ref 38–126)
ALT: 47 U/L (ref 17–63)
AST: 99 U/L — AB (ref 15–41)
Anion gap: 14 (ref 5–15)
BILIRUBIN TOTAL: 0.3 mg/dL (ref 0.3–1.2)
BUN: 12 mg/dL (ref 6–20)
CALCIUM: 8.7 mg/dL — AB (ref 8.9–10.3)
CO2: 26 mmol/L (ref 22–32)
Chloride: 103 mmol/L (ref 101–111)
Creatinine, Ser: 0.67 mg/dL (ref 0.61–1.24)
GFR calc Af Amer: >60 mL/min (ref >60)
GFR calc non Af Amer: >60 mL/min (ref >60)
GLUCOSE: 105 mg/dL — AB (ref 65–99)
Potassium: 4.2 mmol/L (ref 3.5–5.1)
Sodium: 143 mmol/L (ref 135–145)
TOTAL PROTEIN: 7.2 g/dL (ref 6.5–8.1)

## 2015-10-27 LAB — ETHANOL: Alcohol, Ethyl (B): 375 mg/dL (ref <5)

## 2015-10-27 NOTE — ED Notes (Signed)
Pt resting on stretcher, RR even and unlabored, NAD

## 2015-10-27 NOTE — ED Notes (Signed)
Pt resting on stretcher with eyes closed, RR even and unlabored, NAD 

## 2015-10-27 NOTE — Discharge Instructions (Signed)
Alcohol Intoxication  Alcohol intoxication occurs when the amount of alcohol that a person has consumed impairs his or her ability to mentally and physically function. Alcohol directly impairs the normal chemical activity of the brain. Drinking large amounts of alcohol can lead to changes in mental function and behavior, and it can cause many physical effects that can be harmful.   Alcohol intoxication can range in severity from mild to very severe. Various factors can affect the level of intoxication that occurs, such as the person's age, gender, weight, frequency of alcohol consumption, and the presence of other medical conditions (such as diabetes, seizures, or heart conditions). Dangerous levels of alcohol intoxication may occur when people drink large amounts of alcohol in a short period (binge drinking). Alcohol can also be especially dangerous when combined with certain prescription medicines or "recreational" drugs.  SIGNS AND SYMPTOMS  Some common signs and symptoms of mild alcohol intoxication include:  · Loss of coordination.  · Changes in mood and behavior.  · Impaired judgment.  · Slurred speech.  As alcohol intoxication progresses to more severe levels, other signs and symptoms will appear. These may include:  · Vomiting.  · Confusion and impaired memory.  · Slowed breathing.  · Seizures.  · Loss of consciousness.  DIAGNOSIS   Your health care provider will take a medical history and perform a physical exam. You will be asked about the amount and type of alcohol you have consumed. Blood tests will be done to measure the concentration of alcohol in your blood. In many places, your blood alcohol level must be lower than 80 mg/dL (0.08%) to legally drive. However, many dangerous effects of alcohol can occur at much lower levels.   TREATMENT   People with alcohol intoxication often do not require treatment. Most of the effects of alcohol intoxication are temporary, and they go away as the alcohol naturally  leaves the body. Your health care provider will monitor your condition until you are stable enough to go home. Fluids are sometimes given through an IV access tube to help prevent dehydration.   HOME CARE INSTRUCTIONS  · Do not drive after drinking alcohol.  · Stay hydrated. Drink enough water and fluids to keep your urine clear or pale yellow. Avoid caffeine.    · Only take over-the-counter or prescription medicines as directed by your health care provider.    SEEK MEDICAL CARE IF:   · You have persistent vomiting.    · You do not feel better after a few days.  · You have frequent alcohol intoxication. Your health care provider can help determine if you should see a substance use treatment counselor.  SEEK IMMEDIATE MEDICAL CARE IF:   · You become shaky or tremble when you try to stop drinking.    · You shake uncontrollably (seizure).    · You throw up (vomit) blood. This may be bright red or may look like black coffee grounds.    · You have blood in your stool. This may be bright red or may appear as a black, tarry, bad smelling stool.    · You become lightheaded or faint.    MAKE SURE YOU:   · Understand these instructions.  · Will watch your condition.  · Will get help right away if you are not doing well or get worse.     This information is not intended to replace advice given to you by your health care provider. Make sure you discuss any questions you have with your health care provider.       Document Released: 03/22/2005 Document Revised: 02/12/2013 Document Reviewed: 11/15/2012  Elsevier Interactive Patient Education ©2016 Elsevier Inc.

## 2015-10-27 NOTE — ED Provider Notes (Signed)
CSN: [id number]     Arrival date & time 10/26/15  2332 History  By signing my name below, I, Tanda Rockers, attest that this documentation has been prepared under the direction and in the presence of Geoffery Lyons, MD. Electronically Signed: Tanda Rockers, ED Scribe. 10/27/2015. 12:34 AM.   Chief Complaint  Patient presents with  . Alcohol Intoxication   Patient is a 45 y.o. male presenting with intoxication. The history is provided by the patient. No language interpreter was used.  Alcohol Intoxication This is a chronic problem. The current episode started 3 to 5 hours ago. The problem has not changed since onset.He has tried nothing for the symptoms. The treatment provided no relief.     HPI Comments: Jordan Davidson is a 45 y.o. male brought in by ambulance from jail, with PMHx alcohol abuse, depression, and bipolar 1 disorder who presents to the Emergency Department for EtOH intoxication and medical clearance. Per triage report, pt was brought into the ED tonight for medical clearance prior to being processed at the jail. Pt denies being arrested tonight. He states EMS brought him here because he was unable to walk due to intoxication. He admits to drinking Listerine and 1/2 gallon of Vodka tonight.   Past Medical History  Diagnosis Date  . Alcohol abuse   . Depression   . Bipolar 1 disorder, depressed (HCC)    Past Surgical History  Procedure Laterality Date  . No past surgeries     Family History  Problem Relation Age of Onset  . Hypertension Mother   . Cancer Father   . Heart failure Father   . Alcoholism Father    Social History  Substance Use Topics  . Smoking status: Current Every Day Smoker  . Smokeless tobacco: None  . Alcohol Use: Yes     Comment: daily - "whatever I can get" - 1/5 plus daily    Review of Systems  All other systems reviewed and are negative.     Allergies  Review of patient's allergies indicates no known allergies.  Home Medications   Prior  to Admission medications   Medication Sig Start Date End Date Taking? Authorizing Provider  acamprosate (CAMPRAL) 333 MG tablet Take 2 tablets (666 mg total) by mouth 3 (three) times daily with meals. For alcoholism, Patient not taking: Reported on 07/26/2015 02/16/15   Sanjuana Kava, NP  cephALEXin (KEFLEX) 500 MG capsule Take 1 capsule (500 mg total) by mouth 2 (two) times daily. 08/21/15   Tiffany Neva Seat, PA-C  FLUoxetine (PROZAC) 20 MG capsule Take 1 capsule (20 mg total) by mouth daily. For depression 02/16/15   Sanjuana Kava, NP  gabapentin (NEURONTIN) 300 MG capsule Take 1 capsule (300 mg total) by mouth 3 (three) times daily. For agitation 02/16/15   Sanjuana Kava, NP  hydrocerin (EUCERIN) CREA Apply 1 application topically 2 (two) times daily. For dry skin Patient not taking: Reported on 05/08/2015 02/16/15   Sanjuana Kava, NP  hydrOXYzine (ATARAX/VISTARIL) 50 MG tablet Take 1 tablet (50 mg total) by mouth at bedtime as needed for anxiety (insomnia). 02/16/15   Sanjuana Kava, NP  ibuprofen (ADVIL,MOTRIN) 600 MG tablet Take 1 tablet (600 mg total) by mouth every 6 (six) hours as needed. 08/21/15   Tiffany Neva Seat, PA-C  ibuprofen (ADVIL,MOTRIN) 800 MG tablet Take 1 tablet (800 mg total) by mouth 3 (three) times daily. 03/19/15   Tommi Rumps, PA-C  OLANZapine (ZYPREXA) 10 MG tablet Take 1 tablet (10  mg total) by mouth at bedtime. For mood control 02/16/15   Sanjuana KavaAgnes I Nwoko, NP  pantoprazole (PROTONIX) 20 MG tablet Take 1 tablet (20 mg total) by mouth daily. 10/24/15   Antony MaduraKelly Humes, PA-C  traZODone (DESYREL) 150 MG tablet Take 1 tablet (150 mg total) by mouth at bedtime. 02/16/15   Sanjuana KavaAgnes I Nwoko, NP   BP 135/95 mmHg  Pulse 97  Temp(Src) 98.1 F (36.7 C) (Oral)  Resp 14  SpO2 92%   Physical Exam  Constitutional: He is oriented to person, place, and time. He appears well-developed.  Pt appears disheveled and obviously intoxicated.   HENT:  Head: Normocephalic and atraumatic.  Eyes: EOM are  normal.  Neck: Normal range of motion.  Cardiovascular: Normal rate, regular rhythm, normal heart sounds and intact distal pulses.   Pulmonary/Chest: Effort normal and breath sounds normal. No respiratory distress.  Abdominal: Soft. He exhibits no distension. There is no tenderness.  Musculoskeletal: Normal range of motion.  Neurological: He is alert and oriented to person, place, and time.  Skin: Skin is warm and dry.  Psychiatric: He has a normal mood and affect. Judgment normal.  Nursing note and vitals reviewed.   ED Course  Procedures (including critical care time)  DIAGNOSTIC STUDIES: Oxygen Saturation is 92% on RA, adequate by my interpretation.    COORDINATION OF CARE: 12:33 AM-Discussed treatment plan with pt at bedside and pt agreed to plan.   Labs Review Labs Reviewed - No data to display  Imaging Review No results found.    EKG Interpretation None      MDM   Final diagnoses:  None    Patient well known to the emergency department for frequent visits involving alcohol and psychiatric issues. He presents today complaining of difficulty ambulating after drinking too much alcohol. His blood alcohol today is 375. He was allowed to sleep off his intoxication and is now waking up. I feel as though he is appropriate for discharge.   I personally performed the services described in this documentation, which was scribed in my presence. The recorded information has been reviewed and is accurate.       Geoffery Lyonsouglas Jerod Mcquain, MD 10/27/15 [phone]

## 2015-10-27 NOTE — ED Notes (Signed)
Informed pt about breakfast and that I would be with him shortly. Upon returning to pt, he was not able to be found. Lenard GallowayAnnieah M NT and Consuello MasseLauren Allen RN attempted to look for this pt. Pt was unable to be located. Pt appeared at time without complaint.

## 2015-12-11 ENCOUNTER — Emergency Department (HOSPITAL_COMMUNITY)
Admission: EM | Admit: 2015-12-11 | Discharge: 2015-12-12 | Disposition: A | Discharge status: Home / Self Care | Payer: Self-pay | Attending: Emergency Medicine | Admitting: Emergency Medicine

## 2015-12-11 ENCOUNTER — Encounter (HOSPITAL_COMMUNITY): Payer: Self-pay | Admitting: *Deleted

## 2015-12-11 DIAGNOSIS — F17200 Nicotine dependence, unspecified, uncomplicated: Secondary | ICD-10-CM | POA: Insufficient documentation

## 2015-12-11 DIAGNOSIS — F10129 Alcohol abuse with intoxication, unspecified: Secondary | ICD-10-CM | POA: Insufficient documentation

## 2015-12-11 DIAGNOSIS — F319 Bipolar disorder, unspecified: Secondary | ICD-10-CM | POA: Insufficient documentation

## 2015-12-11 DIAGNOSIS — F10920 Alcohol use, unspecified with intoxication, uncomplicated: Secondary | ICD-10-CM

## 2015-12-11 DIAGNOSIS — X58XXXA Exposure to other specified factors, initial encounter: Secondary | ICD-10-CM | POA: Insufficient documentation

## 2015-12-11 DIAGNOSIS — S50311A Abrasion of right elbow, initial encounter: Secondary | ICD-10-CM | POA: Insufficient documentation

## 2015-12-11 DIAGNOSIS — Y92524 Gas station as the place of occurrence of the external cause: Secondary | ICD-10-CM | POA: Insufficient documentation

## 2015-12-11 DIAGNOSIS — Y939 Activity, unspecified: Secondary | ICD-10-CM | POA: Insufficient documentation

## 2015-12-11 DIAGNOSIS — Y999 Unspecified external cause status: Secondary | ICD-10-CM | POA: Insufficient documentation

## 2015-12-11 NOTE — ED Notes (Signed)
Per PTAR, pt was found conscious but intoxicated on the ground at a gas station. Pt states he has had liquor and beer today. Pt has abrasion to right elbow. Pt denies pain.

## 2015-12-11 NOTE — ED Notes (Signed)
Bed: [facility] Expected date:  Expected time:  Means of arrival:  Comments: EMS 45yo Fall / abrasion to rt elbow / ETOH - triage

## 2015-12-11 NOTE — ED Provider Notes (Signed)
CSN: [id number]     Arrival date & time 12/11/15  2150 History   First MD Initiated Contact with Patient 12/11/15 2257     Chief Complaint  Patient presents with  . Alcohol Intoxication     (Consider location/radiation/quality/duration/timing/severity/associated sxs/prior Treatment) HPI Comments: 45 year old male with history of alcohol abuse, depression, and bipolar 1 disorder presents to the emergency department PTAR. Patient was found conscious, but intoxicated on the ground at a gas station. He is sleeping at present and in no distress. He previously reported drinking liquor and beer today. He had no complaints of pain in triage. Patient continues to sleep despite attempts to arouse him.  Patient is a level V caveat secondary to intoxication   Patient is a 45 y.o. male presenting with intoxication. The history is provided by medical records.  Alcohol Intoxication    Past Medical History  Diagnosis Date  . Alcohol abuse   . Depression   . Bipolar 1 disorder, depressed (HCC)    Past Surgical History  Procedure Laterality Date  . No past surgeries     Family History  Problem Relation Age of Onset  . Hypertension Mother   . Cancer Father   . Heart failure Father   . Alcoholism Father    Social History  Substance Use Topics  . Smoking status: Current Every Day Smoker  . Smokeless tobacco: None  . Alcohol Use: Yes     Comment: daily - "whatever I can get" - 1/5 plus daily    Review of Systems  Unable to perform ROS: Other  Intoxicated   Allergies  Review of patient's allergies indicates no known allergies.  Home Medications   Prior to Admission medications   Medication Sig Start Date End Date Taking? Authorizing Provider  FLUoxetine (PROZAC) 20 MG capsule Take 1 capsule (20 mg total) by mouth daily. For depression 02/16/15  Yes Sanjuana Kava, NP  gabapentin (NEURONTIN) 300 MG capsule Take 1 capsule (300 mg total) by mouth 3 (three) times daily. For agitation  02/16/15  Yes Sanjuana Kava, NP  hydrOXYzine (ATARAX/VISTARIL) 50 MG tablet Take 1 tablet (50 mg total) by mouth at bedtime as needed for anxiety (insomnia). 02/16/15  Yes Sanjuana Kava, NP  OLANZapine (ZYPREXA) 10 MG tablet Take 1 tablet (10 mg total) by mouth at bedtime. For mood control 02/16/15  Yes Sanjuana Kava, NP  traZODone (DESYREL) 150 MG tablet Take 1 tablet (150 mg total) by mouth at bedtime. 02/16/15  Yes Sanjuana Kava, NP  acamprosate (CAMPRAL) 333 MG tablet Take 2 tablets (666 mg total) by mouth 3 (three) times daily with meals. For alcoholism, Patient not taking: Reported on 07/26/2015 02/16/15   Sanjuana Kava, NP  cephALEXin (KEFLEX) 500 MG capsule Take 1 capsule (500 mg total) by mouth 2 (two) times daily. Patient not taking: Reported on 12/12/2015 08/21/15   Marlon Pel, PA-C  ibuprofen (ADVIL,MOTRIN) 600 MG tablet Take 1 tablet (600 mg total) by mouth every 6 (six) hours as needed. Patient not taking: Reported on 12/12/2015 08/21/15   Marlon Pel, PA-C  pantoprazole (PROTONIX) 20 MG tablet Take 1 tablet (20 mg total) by mouth daily. Patient not taking: Reported on 12/12/2015 10/24/15   Antony Madura, PA-C   BP 150/98 mmHg  Pulse 110  Temp(Src) 98.1 F (36.7 C) (Oral)  Resp 16  SpO2 97%   Physical Exam  Constitutional: He appears well-developed and well-nourished. No distress.  Nontoxic appearing. Sleeping, in no distress.  HENT:  Head: Normocephalic and atraumatic.  No battle's sign or raccoon's eyes.  Eyes: Conjunctivae and EOM are normal. No scleral icterus.  Neck: Normal range of motion.  Pulmonary/Chest: Effort normal. No respiratory distress.  Respirations even and unlabored  Musculoskeletal: Normal range of motion.  Skin: Skin is warm and dry. No rash noted. He is not diaphoretic. No erythema. No pallor.  Abrasion to R elbow.  Psychiatric: He has a normal mood and affect. His behavior is normal.  Nursing note and vitals reviewed.   ED Course  Procedures  (including critical care time) Labs Review Labs Reviewed - No data to display  Imaging Review No results found.   I have personally reviewed and evaluated these images and lab results as part of my medical decision-making.   EKG Interpretation None      MDM   Final diagnoses:  Alcohol intoxication, uncomplicated (HCC)  Abrasion of elbow, right, initial encounter    45 year old male presents to the emergency department via PTAR for intoxication. He has been able to sleep and sober in the emergency department. Patient was up and ambulatory in department to use the restroom. After waking, he ambulated out of the department in no distress. Patient discharged in satisfactory condition.    Antony MaduraKelly Beni Turrell, PA-C 12/12/15 [phone]  Geoffery Lyonsouglas Delo, MD 12/12/15 [phone]

## 2015-12-12 ENCOUNTER — Emergency Department (HOSPITAL_COMMUNITY)
Admission: EM | Admit: 2015-12-12 | Discharge: 2015-12-13 | Disposition: A | Discharge status: Other Acute Inpatient Hospital | Payer: Self-pay | Attending: Emergency Medicine | Admitting: Emergency Medicine

## 2015-12-12 ENCOUNTER — Encounter (HOSPITAL_COMMUNITY): Payer: Self-pay

## 2015-12-12 DIAGNOSIS — R45851 Suicidal ideations: Secondary | ICD-10-CM | POA: Insufficient documentation

## 2015-12-12 DIAGNOSIS — F17200 Nicotine dependence, unspecified, uncomplicated: Secondary | ICD-10-CM | POA: Insufficient documentation

## 2015-12-12 DIAGNOSIS — F332 Major depressive disorder, recurrent severe without psychotic features: Secondary | ICD-10-CM | POA: Diagnosis present

## 2015-12-12 DIAGNOSIS — F1010 Alcohol abuse, uncomplicated: Secondary | ICD-10-CM | POA: Insufficient documentation

## 2015-12-12 DIAGNOSIS — F1020 Alcohol dependence, uncomplicated: Secondary | ICD-10-CM

## 2015-12-12 DIAGNOSIS — M549 Dorsalgia, unspecified: Secondary | ICD-10-CM | POA: Insufficient documentation

## 2015-12-12 DIAGNOSIS — F331 Major depressive disorder, recurrent, moderate: Secondary | ICD-10-CM | POA: Diagnosis present

## 2015-12-12 DIAGNOSIS — R000 Tachycardia, unspecified: Secondary | ICD-10-CM | POA: Insufficient documentation

## 2015-12-12 DIAGNOSIS — F329 Major depressive disorder, single episode, unspecified: Secondary | ICD-10-CM | POA: Insufficient documentation

## 2015-12-12 LAB — COMPREHENSIVE METABOLIC PANEL
ALT: 32 U/L (ref 17–63)
ANION GAP: 12 (ref 5–15)
AST: 83 U/L — ABNORMAL HIGH (ref 15–41)
Albumin: 4.3 g/dL (ref 3.5–5.0)
Alkaline Phosphatase: 101 U/L (ref 38–126)
BUN: 8 mg/dL (ref 6–20)
CHLORIDE: 99 mmol/L — AB (ref 101–111)
CO2: 27 mmol/L (ref 22–32)
CREATININE: 0.58 mg/dL — AB (ref 0.61–1.24)
Calcium: 8.8 mg/dL — ABNORMAL LOW (ref 8.9–10.3)
Glucose, Bld: 118 mg/dL — ABNORMAL HIGH (ref 65–99)
POTASSIUM: 4 mmol/L (ref 3.5–5.1)
SODIUM: 138 mmol/L (ref 135–145)
Total Bilirubin: 0.2 mg/dL — ABNORMAL LOW (ref 0.3–1.2)
Total Protein: 7.6 g/dL (ref 6.5–8.1)

## 2015-12-12 LAB — RAPID URINE DRUG SCREEN, HOSP PERFORMED
AMPHETAMINES: NOT DETECTED
BARBITURATES: NOT DETECTED
BENZODIAZEPINES: NOT DETECTED
COCAINE: NOT DETECTED
Opiates: NOT DETECTED
Tetrahydrocannabinol: NOT DETECTED

## 2015-12-12 LAB — CBC
HEMATOCRIT: 39.3 % (ref 39.0–52.0)
Hemoglobin: 14.3 g/dL (ref 13.0–17.0)
MCH: 32.3 pg (ref 26.0–34.0)
MCHC: 36.4 g/dL — ABNORMAL HIGH (ref 30.0–36.0)
MCV: 88.7 fL (ref 78.0–100.0)
Platelets: 39 10*3/uL — ABNORMAL LOW (ref 150–400)
RBC: 4.43 MIL/uL (ref 4.22–5.81)
RDW: 15.7 % — AB (ref 11.5–15.5)
WBC: 7.2 10*3/uL (ref 4.0–10.5)

## 2015-12-12 LAB — ETHANOL: ALCOHOL ETHYL (B): 399 mg/dL — AB (ref <5)

## 2015-12-12 MED ORDER — LORAZEPAM 1 MG PO TABS
0.0000 mg | ORAL_TABLET | Freq: Four times a day (QID) | ORAL | Status: DC
Start: 2015-12-13 — End: 2015-12-13
  Administered 2015-12-12: 1 mg via ORAL
  Administered 2015-12-13: 2 mg via ORAL
  Filled 2015-12-12: qty 2
  Filled 2015-12-12: qty 1

## 2015-12-12 MED ORDER — IBUPROFEN 800 MG PO TABS
800.0000 mg | ORAL_TABLET | Freq: Once | ORAL | Status: AC
  Administered 2015-12-12: 800 mg via ORAL
  Filled 2015-12-12: qty 1

## 2015-12-12 MED ORDER — LORAZEPAM 1 MG PO TABS: 0.0000 mg | ORAL_TABLET | Freq: Two times a day (BID) | ORAL | Status: DC

## 2015-12-12 NOTE — ED Notes (Signed)
Bed: [facility] Expected date:  Expected time:  Means of arrival:  Comments: 

## 2015-12-12 NOTE — ED Notes (Signed)
Pt wanded by security. 

## 2015-12-12 NOTE — ED Provider Notes (Signed)
CSN: [id number]     Arrival date & time 12/12/15  [id number] History   First MD Initiated Contact with Patient 12/12/15 0912     Chief Complaint  Patient presents with  . Back Pain  . Suicidal     (Consider location/radiation/quality/duration/timing/severity/associated sxs/prior Treatment) HPI Comments: Patient here with suicidal ideations with plan to overdose. He also is a chronic alcohol abuser and last used prior to arrival. Was seen here last episode was symptoms. Patient has a tramatic back pain without neurological features. Patient is well-known to this department due to his repeated visits for alcohol abuse. Denies any abdominal pain or vomiting. Does have a prior history of suicide attempt  Patient is a 45 y.o. male presenting with back pain. The history is provided by the patient. The history is limited by the condition of the patient.  Back Pain   Past Medical History  Diagnosis Date  . Alcohol abuse   . Depression   . Bipolar 1 disorder, depressed (HCC)    Past Surgical History  Procedure Laterality Date  . No past surgeries     Family History  Problem Relation Age of Onset  . Hypertension Mother   . Cancer Father   . Heart failure Father   . Alcoholism Father    Social History  Substance Use Topics  . Smoking status: Current Every Day Smoker  . Smokeless tobacco: None  . Alcohol Use: Yes     Comment: daily - "whatever I can get" - 1/5 plus daily    Review of Systems  Unable to perform ROS: Psychiatric disorder  Musculoskeletal: Positive for back pain.      Allergies  Review of patient's allergies indicates no known allergies.  Home Medications   Prior to Admission medications   Medication Sig Start Date End Date Taking? Authorizing Provider  acamprosate (CAMPRAL) 333 MG tablet Take 2 tablets (666 mg total) by mouth 3 (three) times daily with meals. For alcoholism, Patient not taking: Reported on 07/26/2015 02/16/15   Sanjuana KavaAgnes I Nwoko, NP  cephALEXin  (KEFLEX) 500 MG capsule Take 1 capsule (500 mg total) by mouth 2 (two) times daily. Patient not taking: Reported on 12/12/2015 08/21/15   Marlon Peliffany Greene, PA-C  FLUoxetine (PROZAC) 20 MG capsule Take 1 capsule (20 mg total) by mouth daily. For depression 02/16/15   Sanjuana KavaAgnes I Nwoko, NP  gabapentin (NEURONTIN) 300 MG capsule Take 1 capsule (300 mg total) by mouth 3 (three) times daily. For agitation 02/16/15   Sanjuana KavaAgnes I Nwoko, NP  hydrOXYzine (ATARAX/VISTARIL) 50 MG tablet Take 1 tablet (50 mg total) by mouth at bedtime as needed for anxiety (insomnia). 02/16/15   Sanjuana KavaAgnes I Nwoko, NP  ibuprofen (ADVIL,MOTRIN) 600 MG tablet Take 1 tablet (600 mg total) by mouth every 6 (six) hours as needed. Patient not taking: Reported on 12/12/2015 08/21/15   Marlon Peliffany Greene, PA-C  OLANZapine (ZYPREXA) 10 MG tablet Take 1 tablet (10 mg total) by mouth at bedtime. For mood control 02/16/15   Sanjuana KavaAgnes I Nwoko, NP  pantoprazole (PROTONIX) 20 MG tablet Take 1 tablet (20 mg total) by mouth daily. Patient not taking: Reported on 12/12/2015 10/24/15   Antony MaduraKelly Humes, PA-C  traZODone (DESYREL) 150 MG tablet Take 1 tablet (150 mg total) by mouth at bedtime. 02/16/15   Sanjuana KavaAgnes I Nwoko, NP   BP 139/94 mmHg  Pulse 113  Temp(Src) 98.2 F (36.8 C) (Oral)  Resp 18  SpO2 96% Physical Exam  Constitutional: He is oriented to person, place, and  time. He appears well-developed and well-nourished.  Non-toxic appearance. No distress.  HENT:  Head: Normocephalic and atraumatic.  Eyes: Conjunctivae, EOM and lids are normal. Pupils are equal, round, and reactive to light.  Neck: Normal range of motion. Neck supple. No tracheal deviation present. No thyroid mass present.  Cardiovascular: Regular rhythm and normal heart sounds.  Tachycardia present.  Exam reveals no gallop.   No murmur heard. Pulmonary/Chest: Effort normal and breath sounds normal. No stridor. No respiratory distress. He has no decreased breath sounds. He has no wheezes. He has no rhonchi. He  has no rales.  Abdominal: Soft. Normal appearance and bowel sounds are normal. He exhibits no distension. There is no tenderness. There is no rebound and no CVA tenderness.  Musculoskeletal: Normal range of motion. He exhibits no edema or tenderness.  Neurological: He is alert and oriented to person, place, and time. He has normal strength. No cranial nerve deficit or sensory deficit. GCS eye subscore is 4. GCS verbal subscore is 5. GCS motor subscore is 6.  Skin: Skin is warm and dry. No abrasion and no rash noted.  Psychiatric: His affect is blunt. His speech is delayed. He is slowed. He expresses suicidal ideation. He expresses suicidal plans.  Nursing note and vitals reviewed.   ED Course  Procedures (including critical care time) Labs Review Labs Reviewed  COMPREHENSIVE METABOLIC PANEL  ETHANOL  CBC  URINE RAPID DRUG SCREEN, HOSP PERFORMED    Imaging Review No results found. I have personally reviewed and evaluated these images and lab results as part of my medical decision-making.   EKG Interpretation None      MDM   Final diagnoses:  None    1:58 PM   Patient rechecked it more awake and alert at this time. Patient now denies being suicidal. Patient will need to become clinically sober and then if he continues to be nonsuicidal be discharged home  Lorre Nick, MD 12/12/15 1359

## 2015-12-12 NOTE — ED Notes (Addendum)
Pt ambulated to the Bathroom without assistance, pt got dressed and ambulated out of the ER without difficulty; pt declined to sign DC instructions after getting dressed

## 2015-12-12 NOTE — ED Notes (Signed)
Patient has ambulated twice in the bathroom and back to bed.

## 2015-12-12 NOTE — ED Notes (Signed)
Pt poured out urine sample after peeing

## 2015-12-12 NOTE — ED Notes (Signed)
Patient states he is unable to ambulate to bathroom at this time.

## 2015-12-12 NOTE — ED Notes (Signed)
Dr. Nguyen is at bedside

## 2015-12-12 NOTE — BH Assessment (Addendum)
Tele Assessment Note   Jordan Davidson is an 45 y.o. male who presents unaccompanied to [facility] ED intoxicated and reporting suicidal ideation. Pt says he would have killed himself today if someone had not called emergency services. He says he is very depressed and hopeless and reports current suicidal ideation with plan to step in front of a bus. He reports a history of one previous suicide attempt years ago when he overdosed on a three month supply of psychiatric medication. Pt reports symptoms including crying spells, social withdrawal, loss of interest in usual pleasures, fatigue, irritability, decreased concentration, decreased sleep, decreased appetite and feelings of guilt and hopelessness. He denies homicidal ideation or history of violence. He denies history of psychotic symptoms except when he is experiences alcohol withdrawal, when he hears music that isn't there.  Pt reports he began drinking alcohol at age 36 and by age [age] was drinking on a regular basis. He states he drinks as much alcohol as he can get, including beer, wine, liquor or mouthwash. Pt's blood alcohol level upon arrival to [facility] was 399. He reports his longest period of sobriety is three months while incarcerated. Pt denies other substance use and urine drug screen is pending.  Pt reports he has been homeless and on the street for approximately three months. 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 Pt reports chronic back pain. He cannot identify any family or friends who are supportive. He has three children, a son age 32 and sixteen-year-old twins who live with their mother. He is uncertain whether he has any current legal problems. Pt says he has been to multiple treatment facilities over the years, the most recent being at [facility] in April 2017.  Pt is disheveled and dressed in hospital scrubs. He is alert, oriented x4 with soft, slow speech and tremulous normal motor behavior. Pt has difficulty walking due to  back pain. Eye contact is good. Pt's mood is depressed and affect is congruent with mood. Thought process is coherent and relevant. There is no indication Pt is currently responding to internal stimuli or experiencing delusional thought content. Pt was cooperative throughout assessment. He is willing to sign himself into a psychiatric facility to address his alcohol use and depression.   Diagnosis: Major Depressive Disorder, Recurrent, Severe Without Psychotic Features; Alcohol Use Disorder, Severe  Past Medical History:  Past Medical History  Diagnosis Date  . Alcohol abuse   . Depression   . Bipolar 1 disorder, depressed (HCC)     Past Surgical History  Procedure Laterality Date  . No past surgeries      Family History:  Family History  Problem Relation Age of Onset  . Hypertension Mother   . Cancer Father   . Heart failure Father   . Alcoholism Father     Social History:  reports that he has been smoking.  He does not have any smokeless tobacco history on file. He reports that he drinks alcohol. He reports that he does not use illicit drugs.  Additional Social History:  Alcohol / Drug Use Pain Medications: none Prescriptions: none Over the Counter: none History of alcohol / drug use?: Yes Longest period of sobriety (when/how long): Three months while incarcerated Negative Consequences of Use: Financial, Legal, Personal relationships, Work / School Withdrawal Symptoms: Seizures, Tremors, Sweats, Nausea / Vomiting, DTs, Diarrhea, Irritability, Blackouts Onset of Seizures: UTA Date of most recent seizure: May 2016 Substance #1 Name of Substance 1: Alcohol 1 - Age of First Use:  10 1 - Amount (size/oz): "As much as I can get" 1 - Frequency: Daily 1 - Duration: Ongoing for years 1 - Last Use / Amount: 12/12/15, unknown  CIWA: CIWA-Ar BP: 122/79 mmHg Pulse Rate: 104 Nausea and Vomiting: mild nausea with no vomiting Tactile Disturbances: none Tremor: no tremor Auditory  Disturbances: not present Paroxysmal Sweats: no sweat visible Visual Disturbances: not present Anxiety: no anxiety, at ease Headache, Fullness in Head: very mild Agitation: normal activity Orientation and Clouding of Sensorium: cannot do serial additions or is uncertain about date CIWA-Ar Total: 3 COWS:    PATIENT STRENGTHS: (choose at least two) Ability for insight Average or above average intelligence Capable of independent living Communication skills General fund of knowledge  Allergies: No Known Allergies  Home Medications:  (Not in a hospital admission)  OB/GYN Status:  No LMP for male patient.  General Assessment Data Location of Assessment: [facility] ED TTS Assessment: In system Is this a Tele or Face-to-Face Assessment?: Face-to-Face Is this an Initial Assessment or a Re-assessment for this encounter?: Initial Assessment Marital status: Separated Maiden name: NA Is patient pregnant?: No Pregnancy Status: No Living Arrangements: Other (Comment) (Homeless) Can pt return to current living arrangement?: Yes Admission Status: Voluntary Is patient capable of signing voluntary admission?: Yes Referral Source: Self/Family/Friend Insurance type: Self-pay     Crisis Care Plan Living Arrangements: Other (Comment) (Homeless) Legal Guardian: Other: (Self) Name of Psychiatrist: None Name of Therapist: None  Education Status Is patient currently in school?: No Current Grade: NA Highest grade of school patient has completed: Some college Name of school: NA Contact person: NA  Risk to self with the past 6 months Suicidal Ideation: Yes-Currently Present Has patient been a risk to self within the past 6 months prior to admission? : Yes Suicidal Intent: Yes-Currently Present Has patient had any suicidal intent within the past 6 months prior to admission? : Yes Is patient at risk for suicide?: Yes Suicidal Plan?: Yes-Currently Present Has patient had any suicidal plan within  the past 6 months prior to admission? : Yes Specify Current Suicidal Plan: Plan to step in front of a bus Access to Means: Yes Specify Access to Suicidal Means: Access to traffic What has been your use of drugs/alcohol within the last 12 months?: Pt drinking large quantities of alcohol Previous Attempts/Gestures: Yes How many times?: 1 Other Self Harm Risks: Pt intoxicated Triggers for Past Attempts: Other (Comment) (Intoxication) Intentional Self Injurious Behavior: None Family Suicide History: No Recent stressful life event(s): Other (Comment) (Homeless) Persecutory voices/beliefs?: No Depression: Yes Depression Symptoms: Despondent, Insomnia, Tearfulness, Isolating, Fatigue, Guilt, Loss of interest in usual pleasures, Feeling worthless/self pity, Feeling angry/irritable Substance abuse history and/or treatment for substance abuse?: Yes Suicide prevention information given to non-admitted patients: Not applicable  Risk to Others within the past 6 months Homicidal Ideation: No Does patient have any lifetime risk of violence toward others beyond the six months prior to admission? : No Thoughts of Harm to Others: No Current Homicidal Intent: No Current Homicidal Plan: No Access to Homicidal Means: No Identified Victim: None History of harm to others?: No Assessment of Violence: None Noted Violent Behavior Description: Pt denies history of violence Does patient have access to weapons?: No Criminal Charges Pending?: No Does patient have a court date: No Is patient on probation?: No  Psychosis Hallucinations: None noted Delusions: None noted  Mental Status Report Appearance/Hygiene: Body odor, Disheveled, In scrubs Eye Contact: Good Motor Activity: Unsteady Speech: Logical/coherent, Soft Level of Consciousness:  Alert, Other (Comment) (Intoxicated) Mood: Depressed Affect: Depressed Anxiety Level: Minimal Thought Processes: Coherent, Relevant Judgement: Partial Orientation:  Person, Place, Time, Situation, Appropriate for developmental age Obsessive Compulsive Thoughts/Behaviors: None  Cognitive Functioning Concentration: Decreased Memory: Recent Intact, Remote Intact IQ: Average Insight: Poor Impulse Control: Fair Appetite: Fair Weight Loss: 0 Weight Gain: 0 Sleep: Decreased Total Hours of Sleep: 5 Vegetative Symptoms: Decreased grooming  ADLScreening [facility]) Patient's cognitive ability adequate to safely complete daily activities?: Yes Patient able to express need for assistance with ADLs?: Yes Independently performs ADLs?: Yes (appropriate for developmental age)  Prior Inpatient Therapy Prior Inpatient Therapy: Yes Prior Therapy Dates: 09/2015, multiple admits Prior Therapy Facilty/Provider(s): [facility], [facility], other facilities Reason for Treatment: Depression, alcohol dependence  Prior Outpatient Therapy Prior Outpatient Therapy: Yes Prior Therapy Dates: unknown Prior Therapy Facilty/Provider(s): unknown Reason for Treatment: Depression, alcohol dependence Does patient have an ACCT team?: No Does patient have Intensive In-House Services?  : No Does patient have [facility] services? : No Does patient have P4CC services?: No  ADL Screening (condition at time of admission) Patient's cognitive ability adequate to safely complete daily activities?: Yes Is the patient deaf or have difficulty hearing?: No Does the patient have difficulty seeing, even when wearing glasses/contacts?: No Does the patient have difficulty concentrating, remembering, or making decisions?: No Patient able to express need for assistance with ADLs?: Yes Does the patient have difficulty dressing or bathing?: No Independently performs ADLs?: Yes (appropriate for developmental age) Does the patient have difficulty walking or climbing stairs?: No Weakness of Legs: None Weakness of Arms/Hands: None       Abuse/Neglect Assessment (Assessment to be complete  while patient is alone) Physical Abuse: Denies Verbal Abuse: Denies Sexual Abuse: Denies Exploitation of patient/patient's resources: Denies Self-Neglect: Denies     Merchant navy officerAdvance Directives (For Healthcare) Does patient have an advance directive?: No Would patient like information on creating an advanced directive?: No - patient declined information    Additional Information 1:1 In Past 12 Months?: No CIRT Risk: No Elopement Risk: No Does patient have medical clearance?: Yes     Disposition: Binnie RailJoann Glover[facility] confirms [facility] at [facility]. Gave clinical report to Alberteen SamFran Hobson, NP who said Pt meets criteria for inpatient dual-diagnosis treatment. TTS will contact other facilities for placement. Notified Tyrone AppleEmily Nguyen, MD of recommendation.  Disposition Initial Assessment Completed for this Encounter: Yes Disposition of Patient: Inpatient treatment program Type of inpatient treatment program: Adult   Pamalee LeydenFord Ellis Natalie Leclaire Jr, [facility], [facility], [facility] Triage Specialist [phone]   Pamalee LeydenWarrick Jr, Reyonna Haack Ellis 12/12/2015 9:46 PM

## 2015-12-12 NOTE — ED Notes (Signed)
Pt sitting up and eating

## 2015-12-12 NOTE — ED Notes (Signed)
Pt ambulated to restroom with sitter and back to bed with needing holding assistance. Pt tolerated it well.

## 2015-12-12 NOTE — ED Notes (Signed)
Pt is complaining of pain and requesting pain medication. Notified provider. Dr. Cyndie ChimeNguyen reports she will come to assess the patient.

## 2015-12-12 NOTE — ED Notes (Signed)
Pt is with TTS °

## 2015-12-12 NOTE — ED Notes (Addendum)
Blue shirt Blue jeans White sneakers Black belt  Green [employer] wallet [employer] bus pass-expires 08/16/16 SS card EBT Card [place] ID CARD

## 2015-12-12 NOTE — ED Notes (Signed)
Pt presents with c/o suicidal ideation and back pain. Pt reports that his back pain started yesterday, no injury reported by patient. Pt reports he has had "maybe a 40" in terms of alcohol intake in the last 24 hours. Pt has a strong odor of ETOH and is slurring his words. Pt reports he has suicidal thoughts and wishes only, no plan.

## 2015-12-13 ENCOUNTER — Observation Stay (HOSPITAL_COMMUNITY)
Admission: AD | Admit: 2015-12-13 | Discharge: 2015-12-15 | Disposition: A | Discharge status: Home / Self Care | Payer: Federal, State, Local not specified - Other | Source: Intra-hospital | Attending: Emergency Medicine | Admitting: Emergency Medicine

## 2015-12-13 DIAGNOSIS — F331 Major depressive disorder, recurrent, moderate: Secondary | ICD-10-CM | POA: Diagnosis present

## 2015-12-13 DIAGNOSIS — F3132 Bipolar disorder, current episode depressed, moderate: Principal | ICD-10-CM | POA: Insufficient documentation

## 2015-12-13 DIAGNOSIS — F1014 Alcohol abuse with alcohol-induced mood disorder: Secondary | ICD-10-CM | POA: Diagnosis present

## 2015-12-13 DIAGNOSIS — F332 Major depressive disorder, recurrent severe without psychotic features: Secondary | ICD-10-CM | POA: Diagnosis present

## 2015-12-13 DIAGNOSIS — F17200 Nicotine dependence, unspecified, uncomplicated: Secondary | ICD-10-CM | POA: Insufficient documentation

## 2015-12-13 DIAGNOSIS — M62830 Muscle spasm of back: Secondary | ICD-10-CM

## 2015-12-13 DIAGNOSIS — Z590 Homelessness: Secondary | ICD-10-CM | POA: Insufficient documentation

## 2015-12-13 DIAGNOSIS — Y908 Blood alcohol level of 240 mg/100 ml or more: Secondary | ICD-10-CM | POA: Insufficient documentation

## 2015-12-13 DIAGNOSIS — M549 Dorsalgia, unspecified: Secondary | ICD-10-CM

## 2015-12-13 DIAGNOSIS — F10930 Alcohol use, unspecified with withdrawal, uncomplicated: Secondary | ICD-10-CM

## 2015-12-13 DIAGNOSIS — R112 Nausea with vomiting, unspecified: Secondary | ICD-10-CM

## 2015-12-13 DIAGNOSIS — R197 Diarrhea, unspecified: Secondary | ICD-10-CM

## 2015-12-13 DIAGNOSIS — M545 Low back pain, unspecified: Secondary | ICD-10-CM

## 2015-12-13 DIAGNOSIS — I10 Essential (primary) hypertension: Secondary | ICD-10-CM | POA: Insufficient documentation

## 2015-12-13 DIAGNOSIS — F10230 Alcohol dependence with withdrawal, uncomplicated: Secondary | ICD-10-CM

## 2015-12-13 DIAGNOSIS — G8929 Other chronic pain: Secondary | ICD-10-CM

## 2015-12-13 DIAGNOSIS — F10239 Alcohol dependence with withdrawal, unspecified: Secondary | ICD-10-CM | POA: Insufficient documentation

## 2015-12-13 DIAGNOSIS — R45851 Suicidal ideations: Secondary | ICD-10-CM

## 2015-12-13 MED ORDER — LOPERAMIDE HCL 2 MG PO CAPS
2.0000 mg | ORAL_CAPSULE | ORAL | Status: DC | PRN
  Administered 2015-12-15: 4 mg via ORAL
  Filled 2015-12-13: qty 2

## 2015-12-13 MED ORDER — TRAZODONE HCL 150 MG PO TABS
150.0000 mg | ORAL_TABLET | Freq: Every day | ORAL | Status: DC
  Administered 2015-12-13 – 2015-12-14 (×2): 150 mg via ORAL
  Filled 2015-12-13 (×2): qty 1

## 2015-12-13 MED ORDER — LORAZEPAM 1 MG PO TABS: 1.0000 mg | ORAL_TABLET | Freq: Once | ORAL | Status: DC

## 2015-12-13 MED ORDER — FLUOXETINE HCL 20 MG PO CAPS
20.0000 mg | ORAL_CAPSULE | Freq: Every day | ORAL | Status: DC
  Administered 2015-12-14 – 2015-12-15 (×2): 20 mg via ORAL
  Filled 2015-12-13 (×2): qty 1

## 2015-12-13 MED ORDER — GABAPENTIN 300 MG PO CAPS
300.0000 mg | ORAL_CAPSULE | Freq: Three times a day (TID) | ORAL | Status: DC
  Administered 2015-12-13 (×2): 300 mg via ORAL
  Filled 2015-12-13 (×2): qty 1

## 2015-12-13 MED ORDER — LORAZEPAM 1 MG PO TABS
1.0000 mg | ORAL_TABLET | Freq: Three times a day (TID) | ORAL | Status: DC
  Administered 2015-12-15: 1 mg via ORAL
  Filled 2015-12-13: qty 1

## 2015-12-13 MED ORDER — OLANZAPINE 10 MG PO TABS: 10.0000 mg | ORAL_TABLET | Freq: Every day | ORAL | Status: DC

## 2015-12-13 MED ORDER — ADULT MULTIVITAMIN W/MINERALS CH
1.0000 | ORAL_TABLET | Freq: Every day | ORAL | Status: DC
  Administered 2015-12-14 – 2015-12-15 (×2): 1 via ORAL
  Filled 2015-12-13 (×2): qty 1

## 2015-12-13 MED ORDER — LORAZEPAM 1 MG PO TABS: 1.0000 mg | ORAL_TABLET | Freq: Two times a day (BID) | ORAL | Status: DC

## 2015-12-13 MED ORDER — LORAZEPAM 1 MG PO TABS
1.0000 mg | ORAL_TABLET | Freq: Four times a day (QID) | ORAL | Status: AC
  Administered 2015-12-13 – 2015-12-15 (×6): 1 mg via ORAL
  Filled 2015-12-13 (×6): qty 1

## 2015-12-13 MED ORDER — PANTOPRAZOLE SODIUM 20 MG PO TBEC
20.0000 mg | DELAYED_RELEASE_TABLET | Freq: Every day | ORAL | Status: DC
  Administered 2015-12-14 – 2015-12-15 (×2): 20 mg via ORAL
  Filled 2015-12-13 (×3): qty 1

## 2015-12-13 MED ORDER — LORAZEPAM 2 MG/ML IJ SOLN
2.0000 mg | Freq: Once | INTRAMUSCULAR | Status: AC
  Administered 2015-12-13: 2 mg via INTRAMUSCULAR
  Filled 2015-12-13: qty 1

## 2015-12-13 MED ORDER — IBUPROFEN 800 MG PO TABS
800.0000 mg | ORAL_TABLET | Freq: Once | ORAL | Status: AC
  Administered 2015-12-13: 800 mg via ORAL
  Filled 2015-12-13: qty 1

## 2015-12-13 MED ORDER — CLONIDINE HCL 0.1 MG PO TABS
0.1000 mg | ORAL_TABLET | Freq: Once | ORAL | Status: AC
  Administered 2015-12-13: 0.1 mg via ORAL
  Filled 2015-12-13: qty 1

## 2015-12-13 MED ORDER — IBUPROFEN 200 MG PO TABS
600.0000 mg | ORAL_TABLET | Freq: Four times a day (QID) | ORAL | Status: DC | PRN
  Administered 2015-12-13 – 2015-12-14 (×2): 600 mg via ORAL
  Filled 2015-12-13 (×2): qty 1

## 2015-12-13 MED ORDER — CHLORDIAZEPOXIDE HCL 25 MG PO CAPS
100.0000 mg | ORAL_CAPSULE | Freq: Once | ORAL | Status: AC
  Administered 2015-12-13: 100 mg via ORAL
  Filled 2015-12-13: qty 4

## 2015-12-13 MED ORDER — FLUOXETINE HCL 20 MG PO CAPS
20.0000 mg | ORAL_CAPSULE | Freq: Every day | ORAL | Status: DC
  Administered 2015-12-13: 20 mg via ORAL
  Filled 2015-12-13: qty 1

## 2015-12-13 MED ORDER — HYDROXYZINE HCL 25 MG PO TABS: 50.0000 mg | ORAL_TABLET | Freq: Every evening | ORAL | Status: DC | PRN

## 2015-12-13 MED ORDER — LORAZEPAM 2 MG/ML IJ SOLN
1.0000 mg | Freq: Once | INTRAMUSCULAR | Status: AC
  Administered 2015-12-13: 1 mg via INTRAMUSCULAR
  Filled 2015-12-13: qty 1

## 2015-12-13 MED ORDER — LORAZEPAM 1 MG PO TABS: 1.0000 mg | ORAL_TABLET | ORAL | Status: DC | PRN

## 2015-12-13 MED ORDER — LORAZEPAM 1 MG PO TABS: 1.0000 mg | ORAL_TABLET | Freq: Every day | ORAL | Status: DC

## 2015-12-13 MED ORDER — TRAZODONE HCL 50 MG PO TABS: 150.0000 mg | ORAL_TABLET | Freq: Every day | ORAL | Status: DC

## 2015-12-13 MED ORDER — GABAPENTIN 300 MG PO CAPS
300.0000 mg | ORAL_CAPSULE | Freq: Three times a day (TID) | ORAL | Status: DC
  Administered 2015-12-14 – 2015-12-15 (×5): 300 mg via ORAL
  Filled 2015-12-13 (×4): qty 1

## 2015-12-13 MED ORDER — VITAMIN B-1 100 MG PO TABS
100.0000 mg | ORAL_TABLET | Freq: Every day | ORAL | Status: DC
  Administered 2015-12-14 – 2015-12-15 (×2): 100 mg via ORAL
  Filled 2015-12-13 (×2): qty 1

## 2015-12-13 MED ORDER — ACAMPROSATE CALCIUM 333 MG PO TBEC
666.0000 mg | DELAYED_RELEASE_TABLET | Freq: Three times a day (TID) | ORAL | Status: DC
  Administered 2015-12-13: 666 mg via ORAL
  Filled 2015-12-13 (×3): qty 2

## 2015-12-13 MED ORDER — MAGNESIUM HYDROXIDE 400 MG/5ML PO SUSP
30.0000 mL | Freq: Every day | ORAL | Status: DC | PRN
  Filled 2015-12-13: qty 30

## 2015-12-13 MED ORDER — LORAZEPAM 1 MG PO TABS
2.0000 mg | ORAL_TABLET | Freq: Once | ORAL | Status: AC
  Administered 2015-12-13: 2 mg via ORAL
  Filled 2015-12-13: qty 2

## 2015-12-13 MED ORDER — ONDANSETRON 4 MG PO TBDP: 4.0000 mg | ORAL_TABLET | Freq: Four times a day (QID) | ORAL | Status: DC | PRN

## 2015-12-13 MED ORDER — HYDROXYZINE HCL 50 MG PO TABS: 50.0000 mg | ORAL_TABLET | Freq: Every evening | ORAL | Status: DC | PRN

## 2015-12-13 MED ORDER — CLONIDINE HCL 0.1 MG PO TABS
0.1000 mg | ORAL_TABLET | Freq: Two times a day (BID) | ORAL | Status: DC
  Administered 2015-12-14 – 2015-12-15 (×3): 0.1 mg via ORAL
  Filled 2015-12-13 (×3): qty 1

## 2015-12-13 MED ORDER — PANTOPRAZOLE SODIUM 20 MG PO TBEC
20.0000 mg | DELAYED_RELEASE_TABLET | Freq: Every day | ORAL | Status: DC
  Administered 2015-12-13: 20 mg via ORAL
  Filled 2015-12-13: qty 1

## 2015-12-13 MED ORDER — ACETAMINOPHEN 325 MG PO TABS: 650.0000 mg | ORAL_TABLET | Freq: Four times a day (QID) | ORAL | Status: DC | PRN

## 2015-12-13 MED ORDER — ACETAMINOPHEN 500 MG PO TABS
1000.0000 mg | ORAL_TABLET | Freq: Once | ORAL | Status: AC
  Administered 2015-12-13: 1000 mg via ORAL
  Filled 2015-12-13: qty 2

## 2015-12-13 MED ORDER — CLONIDINE HCL 0.1 MG PO TABS
0.1000 mg | ORAL_TABLET | Freq: Two times a day (BID) | ORAL | Status: DC
  Administered 2015-12-13: 0.1 mg via ORAL
  Filled 2015-12-13: qty 1

## 2015-12-13 MED ORDER — THIAMINE HCL 100 MG/ML IJ SOLN
100.0000 mg | Freq: Once | INTRAMUSCULAR | Status: AC
  Administered 2015-12-13: 100 mg via INTRAMUSCULAR
  Filled 2015-12-13: qty 2

## 2015-12-13 MED ORDER — LORAZEPAM 1 MG PO TABS
1.0000 mg | ORAL_TABLET | Freq: Four times a day (QID) | ORAL | Status: DC | PRN
  Administered 2015-12-14: 1 mg via ORAL
  Filled 2015-12-13: qty 1

## 2015-12-13 MED ORDER — ALUM & MAG HYDROXIDE-SIMETH 200-200-20 MG/5ML PO SUSP: 30.0000 mL | ORAL | Status: DC | PRN

## 2015-12-13 MED ORDER — OLANZAPINE 10 MG PO TABS
10.0000 mg | ORAL_TABLET | Freq: Every day | ORAL | Status: DC
  Administered 2015-12-13 – 2015-12-14 (×2): 10 mg via ORAL
  Filled 2015-12-13 (×2): qty 1

## 2015-12-13 NOTE — ED Provider Notes (Signed)
Case was accepted in sign out pending sobriety. I was asked to reevaluate for suicidal ideation or plan after clinically sober. Even after clinically sober and patient awake and alert in the emergency department patient continued to report suicidal ideation. TTS was consult it. Behavioral Health recommended inpatient treatment for the patient although no beds were available at this time and they are going to seek bed elsewhere for inpatient psychiatric care.  Leta BaptistEmily Roe Nguyen, MD 12/13/15 [phone]

## 2015-12-13 NOTE — Progress Notes (Addendum)
Patient refused to do his admission and vital signs at this time; patient reports I just want to lay down

## 2015-12-13 NOTE — ED Notes (Signed)
[facility] Expected date:  Expected time:  Means of arrival:  [address]

## 2015-12-13 NOTE — ED Notes (Signed)
Patient ambulated to restroom multiple times in last few hours with sitter assisting, tolerated well.

## 2015-12-13 NOTE — ED Notes (Signed)
Patient to Obs bed 5 with [facility] transportation, ambulatory and VSS. Belongings given to transporter.

## 2015-12-13 NOTE — ED Notes (Signed)
Patient ambulated to restroom with assistance of sitter. Patient c/o back pain. Tolerates ambulation well.

## 2015-12-13 NOTE — Discharge Instructions (Signed)
Delta-Aminolevulinic Acid Test WHY AM I HAVING THIS TEST? This is a test for porphyria, which is a form of liver disease. This test can also help your health care provider to evaluate your child for lead poisoning. WHAT KIND OF SAMPLE IS TAKEN? A urine sample is collected in a sterile container that is given to you by the lab. HOW DO I PREPARE FOR THE TEST? There is no preparation required for this test. WHAT ARE THE REFERENCE RANGES? Reference ranges are considered healthy ranges established after testing a large group of healthy people. Reference ranges may vary among different people, labs, and hospitals. It is your responsibility to obtain your test results. Ask the lab or department performing the test when and how you will get your results. The reference range for delta-aminolevulinic acid is:  1.5-7.5 mg in 24 hours or 11-57 micromoles in 24 hours (SI units). WHAT DO THE RESULTS MEAN? Levels above the reference range may indicate:  Porphyria.  Lead poisoning.  Long-term (chronic) alcohol disease.  Diabetic ketoacidosis. Talk with your health care provider to discuss your results, treatment options, and if necessary, the need for more tests. Talk with your health care provider if you have any questions about your results.   This information is not intended to replace advice given to you by your health care provider. Make sure you discuss any questions you have with your health care provider.   Document Released: 07/05/2004 Document Revised: 07/03/2014 Document Reviewed: 12/01/2013 Elsevier Interactive Patient Education 2016 [employer].  Finding Treatment for Addiction WHAT IS ADDICTION? Addiction is a complex disease of the brain. It causes an uncontrollable (compulsive) need for a substance. You can be addicted to alcohol, illegal drugs, or prescription medicines such as painkillers. Addiction can also be a behavior, like gambling or shopping. The need for the drug or  activity can become so strong that you think about it all the time. You can also become physically dependent on a substance. Addiction can change the way your brain works. Because of these changes, getting more of whatever you are addicted to becomes the most important thing to you and feels better than other activities or relationships. Addiction can lead to changes in health, behavior, emotions, relationships, and choices that affect you and everyone around you. HOW DO I KNOW IF I NEED TREATMENT FOR ADDICTION? Addiction is a progressive disease. Without treatment, addiction can get worse. Living with addiction puts you at higher risk for injury, poor health, lost employment, loss of money, and even death. You might need treatment for addiction if:  You have tried to stop or cut down, but you cannot.  Your addiction is causing physical health problems.  You find it annoying that your friends and family are concerned about your alcohol or substance use.  You feel guilty about substance abuse or a compulsive behavior.  You have lied or tried to hide your addiction.  You need a particular substance or activity to start your day or to calm down.  You are getting in trouble at school, work, home, or with the police.  You have done something illegal to support your addiction.  You are running out of money because of your addiction.  You have no time for anything other than your addiction. WHAT TYPES OF TREATMENT ARE AVAILABLE? The treatment program that is right for you will depend on many factors, including the type of addiction you have. Treatment programs can be outpatient or inpatient. In an outpatient program, you live  at home and go to work or school, but you also go to a clinic for treatment. With an inpatient program, you live and sleep at the program facility during treatment. After treatment, you might need a plan for support during recovery. Other treatment options include:    Medicine.  Some addictions may be treated with prescription medicines.  You might also need medicine to treat anxiety or depression.  Counseling and behavior therapy. Therapy can help individuals and families behave in healthier ways and relate more effectively.  Support groups. Confidential group therapy, such as a 12-step program, can help individuals and families during treatment and recovery. No single type of program is right for everyone. Many treatment programs involve a combination of education, counseling, and a 12-step, spiritually-based approach. Some treatment programs are government sponsored. They are geared for patients who do not have private insurance. Treatment programs can vary in many respects, such as:  Cost and types of insurance that are accepted.  Types of on-site medical services that are offered.  Length of stay, setting, and size.  Overall philosophy of treatment. WHAT SHOULD I CONSIDER WHEN SELECTING A TREATMENT PROGRAM? It is important to think about your individual requirements when selecting a treatment program. There are a number of things to consider, such as:  If the program is certified by the appropriate government agency. Even private programs must be certified and employ certified professionals.  If the program is covered by your insurance. If finances are a concern, the first call you should make is to your insurance company, if you have health insurance. Ask for a list of treatment programs that are in your network, and confirm any copayments and deductibles that you may have to pay.  If you do not have insurance, or if you choose to attend a program that does not accept your insurance, discuss whether a payment plan can be set up.  If treatment is available in languages other than English, if needed.  If the program offers detoxification treatment, if needed.  If 12-step meetings are held at the center or if transport is available for  patients to attend meetings at other locations.  If the program is professional, organized, and clean.  If the program meets all of your needs, including physical and cultural needs.  If the facility offers specific treatment for your particular addiction.  If support continues to be offered after you have left the program.  If your treatment plan is continually looked at to make sure you are receiving the right treatment at the right time.  If mental health counseling is part of your treatment.  If medicine is included in treatment, if needed.  If your family is included in your treatment plan and if support is offered to them throughout the treatment process.  How the treatment works to prevent relapse. WHERE ELSE CAN I GET HELP?  Your health care provider. Ask him or her to help you find addiction treatment. These discussions are confidential.  The [employer] and Drug Dependence (NCADD). This group has information about treatment centers and programs for people who have an addiction and for family members.  The telephone number is 1-800-NCA-CALL ((864)587-66821-212-622-9597).  The website is https://ncadd.org/about-ncadd/our-affiliates  The Substance Abuse and Mental Health Services Administration [facility](SAMHSA). This group will help you find publicly funded treatment centers, help hotlines, and counseling services near you.  The telephone number is 1-800-662-HELP (515-635-54561-2060993438).  The website is www.findtreatment.[employer].plsamhsa.gov In countries outside of the [place],  look in local directories for contact information for services in your area.   This information is not intended to replace advice given to you by your health care provider. Make sure you discuss any questions you have with your health care provider.   Document Released: 05/11/2005 Document Revised: 03/03/2015 Document Reviewed: 03/31/2014 Elsevier Interactive Patient Education 2016 [employer].  Alcohol  Use Disorder Alcohol use disorder is a mental disorder. It is not a one-time incident of heavy drinking. Alcohol use disorder is the excessive and uncontrollable use of alcohol over time that leads to problems with functioning in one or more areas of daily living. People with this disorder risk harming themselves and others when they drink to excess. Alcohol use disorder also can cause other mental disorders, such as mood and anxiety disorders, and serious physical problems. People with alcohol use disorder often misuse other drugs.  Alcohol use disorder is common and widespread. Some people with this disorder drink alcohol to cope with or escape from negative life events. Others drink to relieve chronic pain or symptoms of mental illness. People with a family history of alcohol use disorder are at higher risk of losing control and using alcohol to excess.  Drinking too much alcohol can cause injury, accidents, and health problems. One drink can be too much when you are:  Working.  Pregnant or breastfeeding.  Taking medicines. Ask your doctor.  Driving or planning to drive. SYMPTOMS  Signs and symptoms of alcohol use disorder may include the following:   Consumption ofalcohol inlarger amounts or over a longer period of time than intended.  Multiple unsuccessful attempts to cutdown or control alcohol use.   A great deal of time spent obtaining alcohol, using alcohol, or recovering from the effects of alcohol (hangover).  A strong desire or urge to use alcohol (cravings).   Continued use of alcohol despite problems at work, school, or home because of alcohol use.   Continued use of alcohol despite problems in relationships because of alcohol use.  Continued use of alcohol in situations when it is physically hazardous, such as driving a car.  Continued use of alcohol despite awareness of a physical or psychological problem that is likely related to alcohol use. Physical problems  related to alcohol use can involve the brain, heart, liver, stomach, and intestines. Psychological problems related to alcohol use include intoxication, depression, anxiety, psychosis, delirium, and dementia.   The need for increased amounts of alcohol to achieve the same desired effect, or a decreased effect from the consumption of the same amount of alcohol (tolerance).  Withdrawal symptoms upon reducing or stopping alcohol use, or alcohol use to reduce or avoid withdrawal symptoms. Withdrawal symptoms include:  Racing heart.  Hand tremor.  Difficulty sleeping.  Nausea.  Vomiting.  Hallucinations.  Restlessness.  Seizures. DIAGNOSIS Alcohol use disorder is diagnosed through an assessment by your health care provider. Your health care provider may start by asking three or four questions to screen for excessive or problematic alcohol use. To confirm a diagnosis of alcohol use disorder, at least two symptoms must be present within a 72-month period. The severity of alcohol use disorder depends on the number of symptoms:  Mild--two or three.  Moderate--four or five.  Severe--six or more. Your health care provider may perform a physical exam or use results from lab tests to see if you have physical problems resulting from alcohol use. Your health care provider may refer you to a mental health professional for evaluation. TREATMENT  Some people with alcohol use disorder are able to reduce their alcohol use to low-risk levels. Some people with alcohol use disorder need to quit drinking alcohol. When necessary, mental health professionals with specialized training in substance use treatment can help. Your health care provider can help you decide how severe your alcohol use disorder is and what type of treatment you need. The following forms of treatment are available:   Detoxification. Detoxification involves the use of prescription medicines to prevent alcohol withdrawal symptoms in the  first week after quitting. This is important for people with a history of symptoms of withdrawal and for heavy drinkers who are likely to have withdrawal symptoms. Alcohol withdrawal can be dangerous and, in severe cases, cause death. Detoxification is usually provided in a hospital or in-patient substance use treatment facility.  Counseling or talk therapy. Talk therapy is provided by substance use treatment counselors. It addresses the reasons people use alcohol and ways to keep them from drinking again. The goals of talk therapy are to help people with alcohol use disorder find healthy activities and ways to cope with life stress, to identify and avoid triggers for alcohol use, and to handle cravings, which can cause relapse.  Medicines.Different medicines can help treat alcohol use disorder through the following actions:  Decrease alcohol cravings.  Decrease the positive reward response felt from alcohol use.  Produce an uncomfortable physical reaction when alcohol is used (aversion therapy).  Support groups. Support groups are run by people who have quit drinking. They provide emotional support, advice, and guidance. These forms of treatment are often combined. Some people with alcohol use disorder benefit from intensive combination treatment provided by specialized substance use treatment centers. Both inpatient and outpatient treatment programs are available.   This information is not intended to replace advice given to you by your health care provider. Make sure you discuss any questions you have with your health care provider.   Document Released: 07/20/2004 Document Revised: 07/03/2014 Document Reviewed: 09/19/2012 Elsevier Interactive Patient Education [employer]

## 2015-12-13 NOTE — Consult Note (Signed)
[facility] Face-to-Face Psychiatry Consult   Reason for Consult:  Alcohol dependence with suicidal ideations Referring Physician:  EDP Patient Identification: Krrish Freund MRN:  [id number] Principal Diagnosis: Major depressive disorder, recurrent episode, moderate ([place]) Diagnosis:   Patient Active Problem List   Diagnosis Date Noted  . Major depressive disorder, recurrent episode, moderate ([place]) [F33.1] 12/13/2015    Priority: High  . Alcohol use disorder, severe, dependence ([place]) [F10.20] 02/13/2015    Priority: High  . Bipolar I disorder, most recent episode (or current) unspecified [F31.9] 02/14/2015    Total Time spent with patient: 45 minutes  Subjective:   Elius Etheredge is a 45 y.o. male patient admitted to [facility].  HPI:  On admission:  45 y.o. male who presents unaccompanied to [facility] intoxicated and reporting suicidal ideation. Pt says he would have killed himself today if someone had not called emergency services. He says he is very depressed and hopeless and reports current suicidal ideation with plan to step in front of a bus. He reports a history of one previous suicide attempt years ago when he overdosed on a three month supply of psychiatric medication. Pt reports symptoms including crying spells, social withdrawal, loss of interest in usual pleasures, fatigue, irritability, decreased concentration, decreased sleep, decreased appetite and feelings of guilt and hopelessness. He denies homicidal ideation or history of violence. He denies history of psychotic symptoms except when he is experiences alcohol withdrawal, when he hears music that isn't there.  Pt reports he began drinking alcohol at age 1 and by age [age] was drinking on a regular basis. He states he drinks as much alcohol as he can get, including beer, wine, liquor or mouthwash. Pt's blood alcohol level upon arrival to [facility] was 399. He reports his longest period of sobriety is three months while  incarcerated. Pt denies other substance use and urine drug screen is pending.  Pt reports he has been homeless and on the street for approximately three months. 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 Pt reports chronic back pain. He cannot identify any family or friends who are supportive. He has three children, a son age 45 and sixteen-year-old twins who live with their mother. He is uncertain whether he has any current legal problems. Pt says he has been to multiple treatment facilities over the years, the most recent being at [facility] in April 2017.  Today:  Patient has passive suicidal ideations with no plan, shaky at times with elevated blood pressure.  Medications adjusted and Clonidine 0.1 mg BID for HTN started.  Denies hallucinations and homicidal ideations.  Past Psychiatric History: depression, alcohol dependence  Risk to Self: Suicidal Ideation: Yes-Currently Present Suicidal Intent: Yes-Currently Present Is patient at risk for suicide?: Yes Suicidal Plan?: Yes-Currently Present Specify Current Suicidal Plan: Plan to step in front of a bus Access to Means: Yes Specify Access to Suicidal Means: Access to traffic What has been your use of drugs/alcohol within the last 12 months?: Pt drinking large quantities of alcohol How many times?: 1 Other Self Harm Risks: Pt intoxicated Triggers for Past Attempts: Other (Comment) (Intoxication) Intentional Self Injurious Behavior: None Risk to Others: Homicidal Ideation: No Thoughts of Harm to Others: No Current Homicidal Intent: No Current Homicidal Plan: No Access to Homicidal Means: No Identified Victim: None History of harm to others?: No Assessment of Violence: None Noted Violent Behavior Description: Pt denies history of violence Does patient have access to weapons?: No Criminal Charges Pending?: No Does patient  have a court date: No Prior Inpatient Therapy: Prior Inpatient Therapy: Yes Prior Therapy Dates: 09/2015,  multiple admits Prior Therapy Facilty/Provider(s): [facility], [facility], other facilities Reason for Treatment: Depression, alcohol dependence Prior Outpatient Therapy: Prior Outpatient Therapy: Yes Prior Therapy Dates: unknown Prior Therapy Facilty/Provider(s): unknown Reason for Treatment: Depression, alcohol dependence Does patient have an ACCT team?: No Does patient have Intensive In-House Services?  : No Does patient have [facility] services? : No Does patient have P4CC services?: No  Past Medical History:  Past Medical History  Diagnosis Date  . Alcohol abuse   . Depression   . Bipolar 1 disorder, depressed ([place])     Past Surgical History  Procedure Laterality Date  . No past surgeries     Family History:  Family History  Problem Relation Age of Onset  . Hypertension Mother   . Cancer Father   . Heart failure Father   . Alcoholism Father    Family Psychiatric  History: none Social History:  History  Alcohol Use  . Yes    Comment: daily - "whatever I can get" - 1/5 plus daily     History  Drug Use No    Social History   Social History  . Marital Status: Single    Spouse Name: N/A  . Number of Children: N/A  . Years of Education: N/A   Social History Main Topics  . Smoking status: Current Every Day Smoker  . Smokeless tobacco: None  . Alcohol Use: Yes     Comment: daily - "whatever I can get" - 1/5 plus daily  . Drug Use: No  . Sexual Activity: No   Other Topics Concern  . None   Social History Narrative   Additional Social History:    Allergies:  No Known Allergies  Labs:  Results for orders placed or performed during the hospital encounter of 12/12/15 (from the past 48 hour(s))  Comprehensive metabolic panel     Status: Abnormal   Collection Time: 12/12/15  9:30 AM  Result Value Ref Range   Sodium 138 135 - 145 mmol/L   Potassium 4.0 3.5 - 5.1 mmol/L   Chloride 99 (L) 101 - 111 mmol/L   CO2 27 22 - 32 mmol/L   Glucose, Bld 118 (H) 65 - 99 mg/dL    BUN 8 6 - 20 mg/dL   Creatinine, Ser 0.58 (L) 0.61 - 1.24 mg/dL   Calcium 8.8 (L) 8.9 - 10.3 mg/dL   Total Protein 7.6 6.5 - 8.1 g/dL   Albumin 4.3 3.5 - 5.0 g/dL   AST 83 (H) 15 - 41 U/L   ALT 32 17 - 63 U/L   Alkaline Phosphatase 101 38 - 126 U/L   Total Bilirubin 0.2 (L) 0.3 - 1.2 mg/dL   GFR calc non Af Amer >60 >60 mL/min   GFR calc Af Amer >60 >60 mL/min    Comment: (NOTE) The eGFR has been calculated using the CKD EPI equation. This calculation has not been validated in all clinical situations. eGFR's persistently <60 mL/min signify possible Chronic Kidney Disease.    Anion gap 12 5 - 15  Ethanol     Status: Abnormal   Collection Time: 12/12/15  9:30 AM  Result Value Ref Range   Alcohol, Ethyl (B) 399 (HH) <5 mg/dL    Comment: CRITICAL RESULT CALLED TO, READ BACK BY AND VERIFIED WITH: LEONARD,S AT 10:00AM ON 12/12/15 BY FESTERMAN,C        LOWEST DETECTABLE LIMIT FOR  SERUM ALCOHOL IS 5 mg/dL FOR MEDICAL PURPOSES ONLY   cbc     Status: Abnormal   Collection Time: 12/12/15  9:30 AM  Result Value Ref Range   WBC 7.2 4.0 - 10.5 K/uL   RBC 4.43 4.22 - 5.81 MIL/uL   Hemoglobin 14.3 13.0 - 17.0 g/dL   HCT 39.3 39.0 - 52.0 %   MCV 88.7 78.0 - 100.0 fL   MCH 32.3 26.0 - 34.0 pg   MCHC 36.4 (H) 30.0 - 36.0 g/dL   RDW 15.7 (H) 11.5 - 15.5 %   Platelets 39 (L) 150 - 400 K/uL    Comment: RESULT REPEATED AND VERIFIED SPECIMEN CHECKED FOR CLOTS PLATELET COUNT CONFIRMED BY SMEAR   Rapid urine drug screen (hospital performed)     Status: None   Collection Time: 12/12/15  6:40 PM  Result Value Ref Range   Opiates NONE DETECTED NONE DETECTED   Cocaine NONE DETECTED NONE DETECTED   Benzodiazepines NONE DETECTED NONE DETECTED   Amphetamines NONE DETECTED NONE DETECTED   Tetrahydrocannabinol NONE DETECTED NONE DETECTED   Barbiturates NONE DETECTED NONE DETECTED    Comment:        DRUG SCREEN FOR MEDICAL PURPOSES ONLY.  IF CONFIRMATION IS NEEDED FOR ANY PURPOSE, NOTIFY  LAB WITHIN 5 DAYS.        LOWEST DETECTABLE LIMITS FOR URINE DRUG SCREEN Drug Class       Cutoff (ng/mL) Amphetamine      1000 Barbiturate      200 Benzodiazepine   034 Tricyclics       742 Opiates          300 Cocaine          300 THC              50     Current Facility-Administered Medications  Medication Dose Route Frequency Provider Last Rate Last Dose  . acamprosate (CAMPRAL) tablet 666 mg  666 mg Oral TID WC Kolyn Rozario, MD   666 mg at 12/13/15 1132  . cloNIDine (CATAPRES) tablet 0.1 mg  0.1 mg Oral BID Patrecia Pour, NP   0.1 mg at 12/13/15 1132  . FLUoxetine (PROZAC) capsule 20 mg  20 mg Oral Daily Keslyn Teater, MD   20 mg at 12/13/15 1132  . gabapentin (NEURONTIN) capsule 300 mg  300 mg Oral TID Corena Pilgrim, MD   300 mg at 12/13/15 1133  . hydrOXYzine (ATARAX/VISTARIL) tablet 50 mg  50 mg Oral QHS PRN Corena Pilgrim, MD      . LORazepam (ATIVAN) tablet 0-4 mg  0-4 mg Oral Q6H Harvel Quale, MD   Stopped at 12/13/15 5956   Followed by  . [START ON 12/15/2015] LORazepam (ATIVAN) tablet 0-4 mg  0-4 mg Oral Q12H Harvel Quale, MD      . OLANZapine [facility]) tablet 10 mg  10 mg Oral QHS Ra Pfiester, MD      . pantoprazole (PROTONIX) EC tablet 20 mg  20 mg Oral Daily Corena Pilgrim, MD   20 mg at 12/13/15 1132  . traZODone (DESYREL) tablet 150 mg  150 mg Oral QHS Corena Pilgrim, MD       Current Outpatient Prescriptions  Medication Sig Dispense Refill  . acamprosate (CAMPRAL) 333 MG tablet Take 2 tablets (666 mg total) by mouth 3 (three) times daily with meals. For alcoholism, (Patient not taking: Reported on 07/26/2015) 180 tablet 0  . cephALEXin (KEFLEX) 500 MG capsule Take 1 capsule (500  mg total) by mouth 2 (two) times daily. (Patient not taking: Reported on 12/12/2015) 14 capsule 0  . FLUoxetine (PROZAC) 20 MG capsule Take 1 capsule (20 mg total) by mouth daily. For depression 30 capsule 0  . gabapentin (NEURONTIN) 300 MG capsule Take 1 capsule (300  mg total) by mouth 3 (three) times daily. For agitation 90 capsule 0  . hydrOXYzine (ATARAX/VISTARIL) 50 MG tablet Take 1 tablet (50 mg total) by mouth at bedtime as needed for anxiety (insomnia). 30 tablet 0  . ibuprofen (ADVIL,MOTRIN) 600 MG tablet Take 1 tablet (600 mg total) by mouth every 6 (six) hours as needed. (Patient not taking: Reported on 12/12/2015) 30 tablet 0  . OLANZapine (ZYPREXA) 10 MG tablet Take 1 tablet (10 mg total) by mouth at bedtime. For mood control 30 tablet 0  . pantoprazole (PROTONIX) 20 MG tablet Take 1 tablet (20 mg total) by mouth daily. (Patient not taking: Reported on 12/12/2015) 30 tablet 0  . traZODone (DESYREL) 150 MG tablet Take 1 tablet (150 mg total) by mouth at bedtime. 30 tablet 0    Musculoskeletal: Strength & Muscle Tone: within normal limits Gait & Station: normal Patient leans: N/A  Psychiatric Specialty Exam: Physical Exam  Constitutional: He is oriented to person, place, and time. He appears well-developed and well-nourished.  HENT:  Head: Normocephalic.  Neck: Normal range of motion.  Respiratory: Effort normal.  Musculoskeletal: Normal range of motion.  Neurological: He is alert and oriented to person, place, and time.  Skin: Skin is warm and dry.  Psychiatric: His speech is normal and behavior is normal. Judgment normal. His mood appears anxious. Cognition and memory are normal. He exhibits a depressed mood. He expresses suicidal ideation.    Review of Systems  Constitutional: Negative.   HENT: Negative.   Eyes: Negative.   Respiratory: Negative.   Cardiovascular: Negative.   Gastrointestinal: Negative.   Genitourinary: Negative.   Musculoskeletal: Negative.   Skin: Negative.   Neurological: Negative.   Endo/Heme/Allergies: Negative.   Psychiatric/Behavioral: Positive for depression, suicidal ideas and substance abuse. The patient is nervous/anxious.     Blood pressure 147/106, pulse 106, temperature 97.7 F (36.5 C),  temperature source Oral, resp. rate 18, SpO2 99 %.There is no weight on file to calculate BMI.  General Appearance: Disheveled  Eye Contact:  Fair  Speech:  Normal Rate  Volume:  Normal  Mood:  Anxious and Depressed  Affect:  Congruent  Thought Process:  Coherent and Descriptions of Associations: Intact  Orientation:  Full (Time, Place, and Person)  Thought Content:  WDL  Suicidal Thoughts:  Yes.  without intent/plan  Homicidal Thoughts:  No  Memory:  Immediate;   Fair Recent;   Fair Remote;   Fair  Judgement:  Poor  Insight:  Fair  Psychomotor Activity:  Decreased  Concentration:  Concentration: Fair and Attention Span: Fair  Recall:  [employer] of Knowledge:  Fair  Language:  Good  Akathisia:  No  Handed:  Right  AIMS (if indicated):     Assets:  Leisure Time Physical Health Resilience  ADL's:  Intact  Cognition:  WNL  Sleep:        Treatment Plan Summary: Daily contact with patient to assess and evaluate symptoms and progress in treatment, Medication management and Plan major depressive disorder, recurrent, mild:  -Crisis stabilization -Medication management:  Start Ativan alcohol detox protocol.  Continue Prozac 20 mg daily for depression, Trazodone 150 mg at bedtime for sleep, Zyprexa 10 mg  at bedtime for mood and sleep, Campral 666 mg TID for alcohol cravings, and Vistaril 50 mg at bedtime PRN anxiety or sleep.  Start gabapentin 300 mg TID for alcohol withdrawal and Clonidine 0.1 mg BID for HTN -Individual and substance abuse counseling  Disposition: Supportive therapy provided about ongoing stressors.  Waylan Boga, NP 12/13/2015 1:06 PM Patient seen face-to-face for psychiatric evaluation, chart reviewed and case discussed with the physician extender and developed treatment plan. Reviewed the information documented and agree with the treatment plan. Corena Pilgrim, MD

## 2015-12-14 ENCOUNTER — Encounter (HOSPITAL_COMMUNITY): Payer: Self-pay | Admitting: *Deleted

## 2015-12-14 DIAGNOSIS — R45851 Suicidal ideations: Secondary | ICD-10-CM

## 2015-12-14 DIAGNOSIS — F1094 Alcohol use, unspecified with alcohol-induced mood disorder: Secondary | ICD-10-CM

## 2015-12-14 MED ORDER — IBUPROFEN 800 MG PO TABS
800.0000 mg | ORAL_TABLET | Freq: Three times a day (TID) | ORAL | Status: DC
  Administered 2015-12-14 – 2015-12-15 (×2): 800 mg via ORAL
  Filled 2015-12-14 (×3): qty 1

## 2015-12-14 MED ORDER — METHOCARBAMOL 500 MG PO TABS: 500.0000 mg | ORAL_TABLET | Freq: Three times a day (TID) | ORAL | Status: DC | PRN

## 2015-12-14 MED ORDER — KETOROLAC TROMETHAMINE 30 MG/ML IJ SOLN
30.0000 mg | Freq: Once | INTRAMUSCULAR | Status: AC
  Administered 2015-12-14: 30 mg via INTRAMUSCULAR
  Filled 2015-12-14: qty 1

## 2015-12-14 MED ORDER — PREDNISONE 50 MG PO TABS
60.0000 mg | ORAL_TABLET | Freq: Every day | ORAL | Status: DC
  Administered 2015-12-14 – 2015-12-15 (×2): 60 mg via ORAL
  Filled 2015-12-14 (×2): qty 3
  Filled 2015-12-14 (×3): qty 1

## 2015-12-14 NOTE — H&P (Signed)
[facility] P    Patient Identification: Jordan Davidson MRN: [id number] Principal Diagnosis: Major depressive disorder, recurrent episode, moderate ([place]) Diagnosis:  Patient Active Problem List   Diagnosis Date Noted  . Major depressive disorder, recurrent episode, moderate ([place]) [F33.1] 12/13/2015    Priority: High  . Alcohol use disorder, severe, dependence ([place]) [F10.20] 02/13/2015    Priority: High  . Bipolar I disorder, most recent episode (or current) unspecified [F31.9] 02/14/2015    Total Time spent with patient: 45 minutes  Subjective:  Jordan Davidson is a 45 y.o. male patient admitted to [facility] Observation Unit.  HPI: On admission:Jordan Davidson is a 45 y.o. male who presents unaccompanied to [facility] [place] ED intoxicated and reporting suicidal ideation. Pt says he would have killed himself today if someone had not called emergency services. He says he is very depressed and hopeless and reports current suicidal ideation with plan to step in front of a bus. He reports a history of one previous suicide attempt years ago when he overdosed on a three month supply of psychiatric medication. Pt reports symptoms including crying spells, social withdrawal, loss of interest in usual pleasures, fatigue, irritability, decreased concentration, decreased sleep, decreased appetite and feelings of guilt and hopelessness. He denies homicidal ideation or history of violence. He denies history of psychotic symptoms except when he is experiences alcohol withdrawal, when he hears music that isn't there.  Pt reports he began drinking alcohol at age 28 and by age [age] was drinking on a regular basis. He states he drinks as much alcohol as he can get, including beer, wine, liquor or mouthwash. Pt's blood alcohol level upon arrival to [facility] was 399. He reports his longest period of sobriety is three months while incarcerated. Pt denies other substance use and urine drug screen is  pending.  Pt reports he has been homeless and on the street for approximately three months. 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 Pt reports acute back pain.  However, ED records indicate this a chronic complaint. Pt reports sustaining an injury "that I can't remember. But I was very drunk. I mean before two days ago I was able to walk two miles a day no problem. I can barely move right now." Nursing staff in the Observation Unit report that the patient's gait is very unsteady and that he is requiring two person assistance to the bathroom currently. Pt is also having significant detox symptoms at present.   He cannot identify any family or friends who are supportive. He has three children, a son age 47 and sixteen-year-old twins who live with their mother. He is uncertain whether he has any current legal problems. Pt says he has been to multiple treatment facilities over the years, the most recent being at [facility] in April 2017. Today the pt reports withdrawal symptoms of diarrhea, tremors, anxiety, and sweats.   Today: Patient has passive suicidal ideations with plan to "walk in front of a city bus or overdose on my medications", shaky at times with elevated blood pressure. Medications adjusted and Clonidine 0.1 mg BID for HTN started yesterday. Denies hallucinations and homicidal ideations.  Past Psychiatric History: depression, alcohol dependence  Risk to Self: Suicidal Ideation: Yes-Currently Present Suicidal Intent: Yes-Currently Present Is patient at risk for suicide?: Yes Suicidal Plan?: Yes-Currently Present Specify Current Suicidal Plan: Plan to step in front of a bus Access to Means: Yes Specify Access to Suicidal Means: Access to traffic What has been your use of drugs/alcohol  within the last 12 months?: Pt drinking large quantities of alcohol How many times?: 1 Other Self Harm Risks: Pt intoxicated Triggers for Past Attempts: Other (Comment) (Intoxication) Intentional  Self Injurious Behavior: None Risk to Others: Homicidal Ideation: No Thoughts of Harm to Others: No Current Homicidal Intent: No Current Homicidal Plan: No Access to Homicidal Means: No Identified Victim: None History of harm to others?: No Assessment of Violence: None Noted Violent Behavior Description: Pt denies history of violence Does patient have access to weapons?: No Criminal Charges Pending?: No Does patient have a court date: No Prior Inpatient Therapy: Prior Inpatient Therapy: Yes Prior Therapy Dates: 09/2015, multiple admits Prior Therapy Facilty/Provider(s): [facility], [facility], other facilities Reason for Treatment: Depression, alcohol dependence Prior Outpatient Therapy: Prior Outpatient Therapy: Yes Prior Therapy Dates: unknown Prior Therapy Facilty/Provider(s): unknown Reason for Treatment: Depression, alcohol dependence Does patient have an ACCT team?: No Does patient have Intensive In-House Services? : No Does patient have [facility] services? : No Does patient have P4CC services?: No  Past Medical History:  Past Medical History  Diagnosis Date  . Alcohol abuse   . Depression   . Bipolar 1 disorder, depressed ([place])     Past Surgical History  Procedure Laterality Date  . No past surgeries     Family History:  Family History  Problem Relation Age of Onset  . Hypertension Mother   . Cancer Father   . Heart failure Father   . Alcoholism Father    Family Psychiatric History: Patient reports that both parents "battled alcohol addiction."  Social History:  History  Alcohol Use  . Yes    Comment: daily - "whatever I can get" - 1/5 plus daily    History  Drug Use No    Social History   Social History  . Marital Status: Single    Spouse Name: N/A  . Number of Children: N/A  . Years of Education: N/A   Social History Main Topics  . Smoking status: Current Every Day Smoker   . Smokeless tobacco: None  . Alcohol Use: Yes     Comment: daily - "whatever I can get" - 1/5 plus daily  . Drug Use: No  . Sexual Activity: No   Other Topics Concern  . None   Social History Narrative   Additional Social History:    Allergies: No Known Allergies  Labs:   Lab Results Last 48 Hours    Results for orders placed or performed during the hospital encounter of 12/12/15 (from the past 48 hour(s))  Comprehensive metabolic panel Status: Abnormal   Collection Time: 12/12/15 9:30 AM  Result Value Ref Range   Sodium 138 135 - 145 mmol/L   Potassium 4.0 3.5 - 5.1 mmol/L   Chloride 99 (L) 101 - 111 mmol/L   CO2 27 22 - 32 mmol/L   Glucose, Bld 118 (H) 65 - 99 mg/dL   BUN 8 6 - 20 mg/dL   Creatinine, Ser 0.58 (L) 0.61 - 1.24 mg/dL   Calcium 8.8 (L) 8.9 - 10.3 mg/dL   Total Protein 7.6 6.5 - 8.1 g/dL   Albumin 4.3 3.5 - 5.0 g/dL   AST 83 (H) 15 - 41 U/L   ALT 32 17 - 63 U/L   Alkaline Phosphatase 101 38 - 126 U/L   Total Bilirubin 0.2 (L) 0.3 - 1.2 mg/dL   GFR calc non Af Amer >60 >60 mL/min   GFR calc Af Amer >60 >60 mL/min    Comment: (  NOTE) The eGFR has been calculated using the CKD EPI equation. This calculation has not been validated in all clinical situations. eGFR's persistently <60 mL/min signify possible Chronic Kidney Disease.    Anion gap 12 5 - 15  Ethanol Status: Abnormal   Collection Time: 12/12/15 9:30 AM  Result Value Ref Range   Alcohol, Ethyl (B) 399 (HH) <5 mg/dL    Comment: CRITICAL RESULT CALLED TO, READ BACK BY AND VERIFIED WITH: LEONARD,S AT 10:00AM ON 12/12/15 BY FESTERMAN,C   LOWEST DETECTABLE LIMIT FOR SERUM ALCOHOL IS 5 mg/dL FOR MEDICAL PURPOSES ONLY   cbc Status: Abnormal   Collection Time: 12/12/15 9:30 AM  Result Value Ref Range   WBC 7.2 4.0 - 10.5 K/uL   RBC 4.43 4.22 -  5.81 MIL/uL   Hemoglobin 14.3 13.0 - 17.0 g/dL   HCT 39.3 39.0 - 52.0 %   MCV 88.7 78.0 - 100.0 fL   MCH 32.3 26.0 - 34.0 pg   MCHC 36.4 (H) 30.0 - 36.0 g/dL   RDW 15.7 (H) 11.5 - 15.5 %   Platelets 39 (L) 150 - 400 K/uL    Comment: RESULT REPEATED AND VERIFIED SPECIMEN CHECKED FOR CLOTS PLATELET COUNT CONFIRMED BY SMEAR   Rapid urine drug screen (hospital performed) Status: None   Collection Time: 12/12/15 6:40 PM  Result Value Ref Range   Opiates NONE DETECTED NONE DETECTED   Cocaine NONE DETECTED NONE DETECTED   Benzodiazepines NONE DETECTED NONE DETECTED   Amphetamines NONE DETECTED NONE DETECTED   Tetrahydrocannabinol NONE DETECTED NONE DETECTED   Barbiturates NONE DETECTED NONE DETECTED    Comment:   DRUG SCREEN FOR MEDICAL PURPOSES ONLY. IF CONFIRMATION IS NEEDED FOR ANY PURPOSE, NOTIFY LAB WITHIN 5 DAYS.   LOWEST DETECTABLE LIMITS FOR URINE DRUG SCREEN Drug Class Cutoff (ng/mL) Amphetamine 1000 Barbiturate 200 Benzodiazepine 782 Tricyclics 956 Opiates 213 Cocaine 300 THC 50       Current Facility-Administered Medications  Medication Dose Route Frequency Provider Last Rate Last Dose  . acamprosate (CAMPRAL) tablet 666 mg 666 mg Oral TID WC Mojeed Akintayo, MD  666 mg at 12/13/15 1132  . cloNIDine (CATAPRES) tablet 0.1 mg 0.1 mg Oral BID Patrecia Pour, NP  0.1 mg at 12/13/15 1132  . FLUoxetine (PROZAC) capsule 20 mg 20 mg Oral Daily Mojeed Akintayo, MD  20 mg at 12/13/15 1132  . gabapentin (NEURONTIN) capsule 300 mg 300 mg Oral TID Corena Pilgrim, MD  300 mg at 12/13/15 1133  . hydrOXYzine (ATARAX/VISTARIL) tablet 50 mg 50 mg Oral QHS PRN Corena Pilgrim, MD    . LORazepam (ATIVAN) tablet 0-4 mg 0-4 mg Oral Q6H Harvel Quale, MD  Stopped at 12/13/15  0865   Followed by  . [START ON 12/15/2015] LORazepam (ATIVAN) tablet 0-4 mg 0-4 mg Oral Q12H Harvel Quale, MD    . OLANZapine [facility]) tablet 10 mg 10 mg Oral QHS Mojeed Akintayo, MD    . pantoprazole (PROTONIX) EC tablet 20 mg 20 mg Oral Daily Corena Pilgrim, MD  20 mg at 12/13/15 1132  . traZODone (DESYREL) tablet 150 mg 150 mg Oral QHS Corena Pilgrim, MD     Current Outpatient Prescriptions  Medication Sig Dispense Refill  . acamprosate (CAMPRAL) 333 MG tablet Take 2 tablets (666 mg total) by mouth 3 (three) times daily with meals. For alcoholism, (Patient not taking: Reported on 07/26/2015) 180 tablet 0  . cephALEXin (KEFLEX) 500 MG capsule Take 1 capsule (500 mg total) by mouth 2 (two)  times daily. (Patient not taking: Reported on 12/12/2015) 14 capsule 0  . FLUoxetine (PROZAC) 20 MG capsule Take 1 capsule (20 mg total) by mouth daily. For depression 30 capsule 0  . gabapentin (NEURONTIN) 300 MG capsule Take 1 capsule (300 mg total) by mouth 3 (three) times daily. For agitation 90 capsule 0  . hydrOXYzine (ATARAX/VISTARIL) 50 MG tablet Take 1 tablet (50 mg total) by mouth at bedtime as needed for anxiety (insomnia). 30 tablet 0  . ibuprofen (ADVIL,MOTRIN) 600 MG tablet Take 1 tablet (600 mg total) by mouth every 6 (six) hours as needed. (Patient not taking: Reported on 12/12/2015) 30 tablet 0  . OLANZapine (ZYPREXA) 10 MG tablet Take 1 tablet (10 mg total) by mouth at bedtime. For mood control 30 tablet 0  . pantoprazole (PROTONIX) 20 MG tablet Take 1 tablet (20 mg total) by mouth daily. (Patient not taking: Reported on 12/12/2015) 30 tablet 0  . traZODone (DESYREL) 150 MG tablet Take 1 tablet (150 mg total) by mouth at bedtime. 30 tablet 0    Musculoskeletal: Strength & Muscle Tone: within normal limits Gait & Station: normal Patient leans: N/A  Psychiatric Specialty Exam: Physical  Exam  Constitutional: He is oriented to person, place, and time. He appears well-developed and well-nourished.  HENT:  Head: Normocephalic.  Neck: Normal range of motion.  Respiratory: Effort normal.  Musculoskeletal: Normal range of motion.  Neurological: He is alert and oriented to person, place, and time.  Skin: Skin is warm and dry.  Psychiatric: His speech is normal and behavior is normal. Judgment normal. His mood appears anxious. Cognition and memory are normal. He exhibits a depressed mood. He expresses suicidal ideation.    Review of Systems  Constitutional: Negative.  HENT: Negative.  Eyes: Negative.  Respiratory: Negative.  Cardiovascular: Negative.  Gastrointestinal: Negative.  Genitourinary: Negative.  Musculoskeletal: Negative.  Skin: Negative.  Neurological: Negative.  Endo/Heme/Allergies: Negative.  Psychiatric/Behavioral: Positive for depression, suicidal ideas and substance abuse. The patient is nervous/anxious.    Blood pressure 147/106, pulse 106, temperature 97.7 F (36.5 C), temperature source Oral, resp. rate 18, SpO2 99 %.There is no weight on file to calculate BMI.  General Appearance: Disheveled  Eye Contact: Fair  Speech: Normal Rate  Volume: Normal  Mood: Anxious and Depressed  Affect: Congruent  Thought Process: Coherent and Descriptions of Associations: Intact  Orientation: Full (Time, Place, and Person)  Thought Content: WDL  Suicidal Thoughts: Yes. without intent/plan  Homicidal Thoughts: No  Memory: Immediate; Fair Recent; Fair Remote; Fair  Judgement: Poor  Insight: Fair  Psychomotor Activity: Decreased  Concentration: Concentration: Fair and Attention Span: Fair  Recall: [employer] of Knowledge: Fair  Language: Good  Akathisia: No  Handed: Right  AIMS (if indicated):    Assets: Leisure Time Physical Health Resilience  ADL's: Intact  Cognition: WNL  Sleep:        Treatment Plan Summary: Daily contact with patient to assess and evaluate symptoms and progress in treatment, Medication management and Plan major depressive disorder, recurrent, mild:  -Crisis stabilization -Medication management: Start Ativan alcohol detox protocol. Continue Prozac 20 mg daily for depression, Trazodone 150 mg at bedtime for sleep, Zyprexa 10 mg at bedtime for mood and sleep, Campral 666 mg TID for alcohol cravings, and Vistaril 50 mg at bedtime PRN anxiety or sleep. Start gabapentin 300 mg TID for alcohol withdrawal and Clonidine 0.1 mg BID for HTN -Individual and substance abuse counseling -Send to the [facility] for evaluation of chronic back  pain due to decreased ability to function and unsteady gait   Disposition: Supportive therapy provided about ongoing stressors. Patient will return to the Observation Unit for further treatment after being evaluated for his report of back pain at the [facility] today.  Elmarie Shiley, NP 12/14/2015 10:25

## 2015-12-14 NOTE — ED Notes (Signed)
[facility] back pain and unsteady gait.

## 2015-12-14 NOTE — Progress Notes (Signed)
patient's gait is very unsteady; patient needed two assist to the bathroom; patient can only move minimally in the bed without assistance; patient is sweating and clammy; patient has loose stools; patient is fatigued and keeps complaining of back pain; patient can not stand without holding on to something or someone; Steffanie RainwaterAC Lindsey made aware of the status of the patient;

## 2015-12-14 NOTE — ED Notes (Addendum)
Called obs unit to verify sitter status, since pt is in room without one at present.  Nurse states that pt denied SI to her, however, cc still lists 'suicidal' in pt's chart.  She states that she will verify this and will call CN back.

## 2015-12-14 NOTE — ED Notes (Signed)
Pt reports R low back pain x 3 days.  States pain is worse with movement.  States in the past, he had same pain and was told it was "pinch nerve and they gave me a muscle relaxer."  Pt is ambulatory without difficulty.

## 2015-12-14 NOTE — Progress Notes (Signed)
[facility] with the treatment. Added robaxin 500 mg as needed to his regimen for pain control. Tawanna Coolerodd reports that his level of functioning has improved stating "I thought this was all new but looking back I do have a pinched nerve." Patient to be monitored overnight in the Observation Unit. He may be moved to the 300 hall tomorrow depending on patient presentation and disposition by am extender. Discussed plan of care with Britney RN and Kenney HousemanLindsay AC this evening. Patient continues to detox from alcohol reporting a positive history for seizures in the past.

## 2015-12-14 NOTE — ED Provider Notes (Signed)
CSN: [id number]     Arrival date & time 12/14/15  1154 History   First MD Initiated Contact with Patient 12/14/15 1319     Chief Complaint  Patient presents with  . Back Pain     (Consider location/radiation/quality/duration/timing/severity/associated sxs/prior Treatment) HPI Comments: Jordan Davidson is a 45 y.o. male with a PMHx of alcohol abuse, depression, and bipolar 1 disorder, well known to this ER, who presents to the ED sent over from behavioral health where he is currently undergoing alcohol detox and awaiting placement for suicidal ideations, sent for evaluation of his acute on chronic back pain. Patient was seen on 12/12/15 in the ER for alcoholism, endorse suicidal ideations as well as low back pain, was evaluated and sent to [facility] after labs were unremarkable side from EtOH level 399. At that time he reported to nursing staff and the EDP that his back pain was a chronic issue and related to "I think a pinched nerve". Patient reports to me that he has had similar episodes of back pain in the past, states that he usually gets muscle relaxers and narcotics which helped with the symptoms and it gradually resolves. He reports that the back pain began approximately 2-3 days ago, describes it as 9/10 constant sharp pain across the lower back, nonradiating, worse with getting up, ambulation, and bending, and somewhat improved with Ativan and ibuprofen that he has gotten at behavioral health. He denies any trauma, heavy lifting, twisting, or known injury. He denies ever having a definite diagnosis of "pinched nerves" but has been told in the past that that's what he had. He states he has most difficulty with initiating movement after he has been laying down or sitting, but once he "gets moving" it is much easier to walk.  He reports he is currently having detox symptoms including nausea, vomiting, and diarrhea, which are improving with Ativan given at behavioral health.  He denies any fevers, chills,  chest pain, shortness of breath, abdominal pain, constipation, dysuria, hematuria, incontinence of urine or stool, cauda equina symptoms or saddle anesthesia, numbness, tingling, or focal weakness. No hx of DM2 or kidney dysfunction.   Per notes from Fransisca Kaufmann NP at [facility]:  Treatment Plan Summary: Daily contact with patient to assess and evaluate symptoms and progress in treatment, Medication management and Plan major depressive disorder, recurrent, mild:  -Crisis stabilization -Medication management: Start Ativan alcohol detox protocol. Continue Prozac 20 mg daily for depression, Trazodone 150 mg at bedtime for sleep, Zyprexa 10 mg at bedtime for mood and sleep, Campral 666 mg TID for alcohol cravings, and Vistaril 50 mg at bedtime PRN anxiety or sleep. Start gabapentin 300 mg TID for alcohol withdrawal and Clonidine 0.1 mg BID for HTN -Individual and substance abuse counseling -Send to the [facility] for evaluation of chronic back pain due to decreased ability to function and unsteady gait   Disposition: Supportive therapy provided about ongoing stressors. Patient will return to the Observation Unit for further treatment after being evaluated for his report of back pain at the [facility] today.  Fransisca Kaufmann, NP 12/14/2015 10:25   Of note, per Care Plan updated: Dr. Cordelia Poche , 09/13/2014  1^ Diagnosis: Chronic Alcohol Abuse, Homelessness  2^ Diagnosis: Bipolar  Background: 45 y/o male with 20 ED visits in March 2016 frequently requests detox from alcohol. Recent admissions to [facility] prevent him from returning to those treatment centers. No reported prior history of suicide attempts.  No documented history of complicated ETOH withdrawal.  Plan:  >  Avoid labs or consulting TTS/ Social work unless patient has a definitive plan of suicide.  > Avoid feeding patient.  > Expedite patients discharge from department if clinically sober. If patient is clinically intoxicated, observe until clinically sober.  Avoid obtaining a serum ETOH unless medically indicated.Have patient escorted from property if he is non-compliant via security/[employer].  Patient is a 45 y.o. male presenting with back pain. The history is provided by the patient and medical records. No language interpreter was used.  Back Pain Location:  Lumbar spine Quality: sharp. Radiates to:  Does not radiate Pain severity:  Severe Pain is:  Same all the time Onset quality:  Gradual Duration:  3 days Timing:  Constant Progression:  Unchanged Chronicity:  Chronic Context: not recent injury   Relieved by:  Ibuprofen and muscle relaxants Worsened by:  Movement, ambulation and bending Ineffective treatments:  None tried Associated symptoms: no abdominal pain, no bladder incontinence, no bowel incontinence, no chest pain, no dysuria, no fever, no numbness, no paresthesias, no perianal numbness, no tingling and no weakness   Risk factors: lack of exercise     Past Medical History  Diagnosis Date  . Alcohol abuse   . Depression   . Bipolar 1 disorder, depressed (HCC)    Past Surgical History  Procedure Laterality Date  . No past surgeries     Family History  Problem Relation Age of Onset  . Hypertension Mother   . Cancer Father   . Heart failure Father   . Alcoholism Father    Social History  Substance Use Topics  . Smoking status: Current Every Day Smoker  . Smokeless tobacco: None  . Alcohol Use: Yes     Comment: daily - "whatever I can get" - 1/5 plus daily    Review of Systems  Constitutional: Negative for fever and chills.  Respiratory: Negative for shortness of breath.   Cardiovascular: Negative for chest pain.  Gastrointestinal: Positive for nausea (due to detox), vomiting (due to detox) and diarrhea (due to detox). Negative for abdominal pain, constipation and bowel incontinence.  Genitourinary: Negative for bladder incontinence, dysuria, hematuria and difficulty urinating (no incontinence).  Musculoskeletal:  Positive for back pain and gait problem (difficult to stand, but once standing easier to walk). Negative for myalgias and arthralgias.  Skin: Negative for color change.  Allergic/Immunologic: Negative for immunocompromised state.  Neurological: Negative for tingling, weakness, numbness and paresthesias.  Psychiatric/Behavioral: Negative for confusion.   10 Systems reviewed and are negative for acute change except as noted in the HPI.    Allergies  Review of patient's allergies indicates no known allergies.  Home Medications   Prior to Admission medications   Medication Sig Start Date End Date Taking? Authorizing Provider  FLUoxetine (PROZAC) 20 MG capsule Take 1 capsule (20 mg total) by mouth daily. For depression 02/16/15  Yes Sanjuana Kava, NP  gabapentin (NEURONTIN) 300 MG capsule Take 1 capsule (300 mg total) by mouth 3 (three) times daily. For agitation 02/16/15  Yes Sanjuana Kava, NP  hydrOXYzine (ATARAX/VISTARIL) 50 MG tablet Take 1 tablet (50 mg total) by mouth at bedtime as needed for anxiety (insomnia). 02/16/15  Yes Sanjuana Kava, NP  traZODone (DESYREL) 150 MG tablet Take 1 tablet (150 mg total) by mouth at bedtime. 02/16/15  Yes Sanjuana Kava, NP  acamprosate (CAMPRAL) 333 MG tablet Take 2 tablets (666 mg total) by mouth 3 (three) times daily with meals. For alcoholism, Patient not taking: Reported on 07/26/2015 02/16/15  Sanjuana KavaAgnes I Nwoko, NP  pantoprazole (PROTONIX) 20 MG tablet Take 1 tablet (20 mg total) by mouth daily. Patient not taking: Reported on 12/14/2015 10/24/15   Antony MaduraKelly Humes, PA-C   BP 125/89 mmHg  Pulse 82  Temp(Src) 98.1 F (36.7 C) (Oral)  Resp 18  Ht 6' (1.829 m)  Wt 82.555 kg  BMI 24.68 kg/m2  SpO2 95% Physical Exam  Constitutional: He is oriented to person, place, and time. Vital signs are normal. He appears well-developed and well-nourished.  Non-toxic appearance. No distress.  Afebrile, nontoxic, NAD, somewhat unkempt appearance  HENT:  Head:  Normocephalic and atraumatic.  Mouth/Throat: Oropharynx is clear and moist and mucous membranes are normal.  Eyes: Conjunctivae and EOM are normal. Right eye exhibits no discharge. Left eye exhibits no discharge.  Neck: Normal range of motion. Neck supple. No spinous process tenderness and no muscular tenderness present. No rigidity. Normal range of motion present.  FROM intact without spinous process TTP, no bony stepoffs or deformities, no paraspinous muscle TTP or muscle spasms. No rigidity or meningeal signs. No bruising or swelling.   Cardiovascular: Normal rate, regular rhythm, normal heart sounds and intact distal pulses.  Exam reveals no gallop and no friction rub.   No murmur heard. Pulmonary/Chest: Effort normal and breath sounds normal. No respiratory distress. He has no decreased breath sounds. He has no wheezes. He has no rhonchi. He has no rales.  Abdominal: Soft. Normal appearance and bowel sounds are normal. He exhibits no distension. There is no tenderness. There is no rigidity, no rebound, no guarding, no CVA tenderness, no tenderness at McBurney's point and negative Murphy's sign.  Musculoskeletal:       Lumbar back: He exhibits decreased range of motion (due to pain), tenderness and spasm. He exhibits no bony tenderness and no deformity.       Back:  Lumbar spine with slightly limited ROM due to pain, without spinous process TTP, no bony stepoffs or deformities, with mild b/l paraspinous muscle TTP and muscle spasms. Strength and sensation grossly intact in all extremities, negative SLR bilaterally, pt able to stand and walk unassisted, although slow to initiate movements but once he is standing he ambulates more easily, slightly antalgic gait but no ataxia. No overlying skin changes. Distal pulses intact.   Neurological: He is alert and oriented to person, place, and time. He has normal strength. No sensory deficit.  Skin: Skin is warm, dry and intact. No rash noted.    Psychiatric: He has a normal mood and affect.  Nursing note and vitals reviewed.   ED Course  Procedures (including critical care time) Labs Review Labs Reviewed - No data to display Results for orders placed or performed during the hospital encounter of 12/12/15  Comprehensive metabolic panel  Result Value Ref Range   Sodium 138 135 - 145 mmol/L   Potassium 4.0 3.5 - 5.1 mmol/L   Chloride 99 (L) 101 - 111 mmol/L   CO2 27 22 - 32 mmol/L   Glucose, Bld 118 (H) 65 - 99 mg/dL   BUN 8 6 - 20 mg/dL   Creatinine, Ser 1.610.58 (L) 0.61 - 1.24 mg/dL   Calcium 8.8 (L) 8.9 - 10.3 mg/dL   Total Protein 7.6 6.5 - 8.1 g/dL   Albumin 4.3 3.5 - 5.0 g/dL   AST 83 (H) 15 - 41 U/L   ALT 32 17 - 63 U/L   Alkaline Phosphatase 101 38 - 126 U/L   Total Bilirubin 0.2 (L) 0.3 -  1.2 mg/dL   GFR calc non Af Amer >60 >60 mL/min   GFR calc Af Amer >60 >60 mL/min   Anion gap 12 5 - 15  Ethanol  Result Value Ref Range   Alcohol, Ethyl (B) 399 (HH) <5 mg/dL  cbc  Result Value Ref Range   WBC 7.2 4.0 - 10.5 K/uL   RBC 4.43 4.22 - 5.81 MIL/uL   Hemoglobin 14.3 13.0 - 17.0 g/dL   HCT 40.9 81.1 - 91.4 %   MCV 88.7 78.0 - 100.0 fL   MCH 32.3 26.0 - 34.0 pg   MCHC 36.4 (H) 30.0 - 36.0 g/dL   RDW 78.2 (H) 95.6 - 21.3 %   Platelets 39 (L) 150 - 400 K/uL  Rapid urine drug screen (hospital performed)  Result Value Ref Range   Opiates NONE DETECTED NONE DETECTED   Cocaine NONE DETECTED NONE DETECTED   Benzodiazepines NONE DETECTED NONE DETECTED   Amphetamines NONE DETECTED NONE DETECTED   Tetrahydrocannabinol NONE DETECTED NONE DETECTED   Barbiturates NONE DETECTED NONE DETECTED   Imaging Review No results found. I have personally reviewed and evaluated these images and lab results as part of my medical decision-making.   EKG Interpretation None      MDM   Final diagnoses:  Chronic back pain  Back muscle spasm  Bilateral low back pain without sciatica  Nausea vomiting and diarrhea  Withdrawal  symptoms, alcohol, uncomplicated (HCC)    45 y.o. male here with acute on chronic back pain; sent from [facility] for evaluation of this. He is there for SI awaiting placement, and undergoing alcohol detox. Was here 2 days ago, seen by Dr. Freida Busman for EtOH intoxication, SI, and back pain. Reported that the back pain is chronic, which he reports to me as well. [facility] reported that he was unsteady on his feet, I was able to assess gait and he is slow to get up from sitting but he is able to ambulate without any assistance, although slow and mildly antalgic. No midline spinal tenderness, some mild b/l paraspinous muscle tenderness in lumbar region with some slight spasms. Labs from 12/12/15 showed EtOH level 399 but otherwise unremarkable for acute findings, preserved kidney function.   Discussed that he's getting ativan which would help for muscle relaxation as well; do not want to add muscle relaxer on top of this.  He has tylenol/motrin on his med orders, but looks like he hasn't gotten motrin since 6am today and isn't getting it as often as he could be getting it; discussed that we can do toradol once here and then I will change the motrin order to be scheduled  q8h instead of as a PRN med. Tylenol still PRN.  Will order prednisone x4 days including now, which should help with some inflammation related pain.  Discussed use of heat, will place this order in the system so he will get it on a schedule to help with symptoms.  He requests narcotics but discussed that this is not indicated for chronic back pain, and don't want to add substances to his system when currently he's trying to get off of alcohol and substance abuse is a high risk for him; not to mention the sedative effects when combined with ativan.   No urinary complaints, no midline spinal tenderness, no concerning s/sx of cauda equina or cord compression, doubt need for emergent imaging or further work up at this time. Will return pt to [facility] after he has  received the toradol and  prednisone. All of these instructions were printed for the [facility] hospital to be aware of instructions given to pt. I explained the diagnosis and have given explicit precautions to return to the ER including for any other new or worsening symptoms. The patient understands and accepts the medical plan as it's been dictated and I have answered their questions. Discharge instructions concerning home care (and care while in [facility]) and orders for these meds have been placed. The patient is STABLE and is discharged to [facility] in good condition. Once he is discharged from [facility], will need to f/up with PCP in 1-2wks for ongoing management of his chronic back pain.  BP 125/89 mmHg  Pulse 82  Temp(Src) 98.1 F (36.7 C) (Oral)  Resp 18  Ht 6' (1.829 m)  Wt 82.555 kg  BMI 24.68 kg/m2  SpO2 95%  Meds ordered this encounter  Medications  . ketorolac (TORADOL) 30 MG/ML injection 30 mg    Sig:   . predniSONE (DELTASONE) tablet 60 mg    Sig:   . ibuprofen (ADVIL,MOTRIN) tablet 800 mg    Sig:       Dari Carpenito Camprubi-Soms, PA-C 12/14/15 1506  [name], MD 12/15/15 [id number]

## 2015-12-14 NOTE — ED Notes (Signed)
Pt ambulating in hallway, redirected back to stretcher.

## 2015-12-14 NOTE — Discharge Instructions (Signed)
I have changed your ibuprofen dosing schedule to be 800mg  every 8 hours on a schedule instead of "as needed" to help with your back pain. You can use additional tylenol as needed for additional relief of symptoms. Use heat to the area of pain, no more than 20 minutes per hour. You will be started on prednisone for 4 days to help with inflammation and pain. You were given a toradol shot here to help with inflammation and pain as well. Stay well hydrated with plenty of water. Continue using ativan as directed for detox and for muscle relaxation/spasms. Whenever you are discharged from the behavioral health facility, follow up with your regular doctor in 1-2 weeks for recheck of your chronic back pain. Continue the therapies prescribed to you by the behavioral health team while you're here.    Chronic Back Pain  When back pain lasts longer than 3 months, it is called chronic back pain.People with chronic back pain often go through certain periods that are more intense (flare-ups).  CAUSES Chronic back pain can be caused by wear and tear (degeneration) on different structures in your back. These structures include:  The bones of your spine (vertebrae) and the joints surrounding your spinal cord and nerve roots (facets).  The strong, fibrous tissues that connect your vertebrae (ligaments). Degeneration of these structures may result in pressure on your nerves. This can lead to constant pain. HOME CARE INSTRUCTIONS  Avoid bending, heavy lifting, prolonged sitting, and activities which make the problem worse.  Take brief periods of rest throughout the day to reduce your pain. Lying down or standing usually is better than sitting while you are resting.  Take over-the-counter or prescription medicines only as directed by your caregiver. SEEK IMMEDIATE MEDICAL CARE IF:   You have weakness or numbness in one of your legs or feet.  You have trouble controlling your bladder or bowels.  You have nausea,  vomiting, abdominal pain, shortness of breath, or fainting.   This information is not intended to replace advice given to you by your health care provider. Make sure you discuss any questions you have with your health care provider.   Document Released: 07/20/2004 Document Revised: 09/04/2011 Document Reviewed: 11/30/2014 Elsevier Interactive Patient Education 2016 Elsevier Inc.  Foot LockerHeat Therapy Heat therapy can help ease sore, stiff, injured, and tight muscles and joints. Heat relaxes your muscles, which may help ease your pain. Heat therapy should only be used on old, pre-existing, or long-lasting (chronic) injuries. Do not use heat therapy unless told by your doctor. HOW TO USE HEAT THERAPY There are several different kinds of heat therapy, including:  Moist heat pack.  Warm water bath.  Hot water bottle.  Electric heating pad.  Heated gel pack.  Heated wrap.  Electric heating pad. GENERAL HEAT THERAPY RECOMMENDATIONS   Do not sleep while using heat therapy. Only use heat therapy while you are awake.  Your skin may turn pink while using heat therapy. Do not use heat therapy if your skin turns red.  Do not use heat therapy if you have new pain.  High heat or long exposure to heat can cause burns. Be careful when using heat therapy to avoid burning your skin.  Do not use heat therapy on areas of your skin that are already irritated, such as with a rash or sunburn. GET HELP IF:   You have blisters, redness, swelling (puffiness), or numbness.  You have new pain.  Your pain is worse. MAKE SURE YOU:  Understand  these instructions.  Will watch your condition.  Will get help right away if you are not doing well or get worse.   This information is not intended to replace advice given to you by your health care provider. Make sure you discuss any questions you have with your health care provider.   Document Released: 09/04/2011 Document Revised: 07/03/2014 Document  Reviewed: 08/05/2013 Elsevier Interactive Patient Education 2016 Elsevier Inc.  Muscle Cramps and Spasms Muscle cramps and spasms are when muscles tighten by themselves. They usually get better within minutes. Muscle cramps are painful. They are usually stronger and last longer than muscle spasms. Muscle spasms may or may not be painful. They can last a few seconds or much longer. HOME CARE  Drink enough fluid to keep your pee (urine) clear or pale yellow.  Massage, stretch, and relax the muscle.  Use a warm towel, heating pad, or warm shower water on tight muscles.  Place ice on the muscle if it is tender or in pain.  Put ice in a plastic bag.  Place a towel between your skin and the bag.  Leave the ice on for 15-20 minutes, 03-04 times a day.  Only take medicine as told by your doctor. GET HELP RIGHT AWAY IF:  Your cramps or spasms get worse, happen more often, or do not get better with time. MAKE SURE YOU:  Understand these instructions.  Will watch your condition.  Will get help right away if you are not doing well or get worse.   This information is not intended to replace advice given to you by your health care provider. Make sure you discuss any questions you have with your health care provider.   Document Released: 05/25/2008 Document Revised: 10/07/2012 Document Reviewed: 05/29/2012 Elsevier Interactive Patient Education [employer]

## 2015-12-14 NOTE — Progress Notes (Signed)
Patient ID: Jordan Davidson, male   DOB: 09/14/1970, 45 y.o.   MRN: [id number] D: Patient asleep from beginning of shift. Pt woke up and had his dinner. Pt gait was unsteady but did not appear confused. Pt c/o tremors, pain, and anxiety. Pt denies SI/HI/AVH. No acute distressed noted at this time.  A: Medications administered as prescribed. Emotional support given and will continue to monitor pt's progress for stabilization. R: Patient remains safe and complaint with medications.

## 2015-12-14 NOTE — ED Notes (Signed)
Bed: [facility] Expected date:  Expected time:  Means of arrival:  Comments: No bed 

## 2015-12-14 NOTE — Progress Notes (Signed)
Patient is complaining of back pain, patient has been given nonarcotic medication and wakes up and reports no relief; patient stated " I need someone to give me some help before I get up and walk out of here"; writer asked what he meant, and the patient stated " I have been here for 3 days and no one has looked at my back"; patient given heating packs and support placed under his back and he was given another pillow to help with head support; patient stated " that feels a little better"

## 2015-12-15 DIAGNOSIS — F331 Major depressive disorder, recurrent, moderate: Secondary | ICD-10-CM | POA: Diagnosis not present

## 2015-12-15 MED ORDER — FLUOXETINE HCL 20 MG PO CAPS: 20.0000 mg | ORAL_CAPSULE | Freq: Every day | ORAL | Status: DC

## 2015-12-15 MED ORDER — GABAPENTIN 300 MG PO CAPS: 300.0000 mg | ORAL_CAPSULE | Freq: Three times a day (TID) | ORAL | Status: DC

## 2015-12-15 MED ORDER — OLANZAPINE 10 MG PO TABS: 10.0000 mg | ORAL_TABLET | Freq: Every day | ORAL | Status: DC

## 2015-12-15 MED ORDER — PREDNISONE 20 MG PO TABS: 20.0000 mg | ORAL_TABLET | Freq: Every day | ORAL | Status: DC

## 2015-12-15 MED ORDER — HYDROXYZINE HCL 50 MG PO TABS: 50.0000 mg | ORAL_TABLET | Freq: Every evening | ORAL | Status: DC | PRN

## 2015-12-15 MED ORDER — TRAZODONE HCL 150 MG PO TABS: 150.0000 mg | ORAL_TABLET | Freq: Every day | ORAL | Status: DC

## 2015-12-15 NOTE — [facility] Counselor (Signed)
Spoke with patient, patient states he is aware of resources for outpatient follow up and declined further resources.  Leler Brion K. Sherlon HandingHarris, LCAS-A, LPC-A, [facility]  Counselor 12/15/2015 1:21 PM

## 2015-12-15 NOTE — [facility] Counselor (Signed)
Anticipate discharge tomorrow from the Observation Unit with resources. Pt will be provided with OPT resources prior to discharge. Final disposition will be made by morning extender.    Ankur Snowdon McNeil, MA OBS Counselor 

## 2015-12-15 NOTE — Progress Notes (Signed)
Patient ID: Jordan Davidson, male   DOB: [birth year], 45 y.o.   MRN: [id number] Patient discharged to home/self care.  Patient was able to contract for safety upon discharge Patient acknowledged understanding of discharge instructions and the need for follow up care.

## 2015-12-15 NOTE — Discharge Summary (Signed)
[facility] OBS UNIT DISCHARGE SUMMARY    Patient Identification: Rutilio Yellowhair MRN: [id number] Principal Diagnosis: Major depressive disorder, recurrent episode, moderate ([place]) Diagnosis:  Patient Active Problem List   Diagnosis Date Noted  . Major depressive disorder, recurrent episode, moderate ([place]) [F33.1] 12/13/2015    Priority: High  . Alcohol use disorder, severe, dependence ([place]) [F10.20] 02/13/2015    Priority: High  . Bipolar I disorder, most recent episode (or current) unspecified [F31.9] 02/14/2015    Total Time spent with patient: 45 minutes  Subjective:  Jordan Davidson is a 45 y.o. male patient admitted to [facility] Observation Unit. Pt seen and chart reviewed. Pt is alert/oriented x4, calm, cooperative, and appropriate to situation. Pt denies suicidal/homicidal ideation and psychosis and does not appear to be responding to internal stimuli. Pt would like outpatient management as below.   HPI: I have reviewed and concur with HPI elements below, collected by my colleagues, modified as follows: :Franke Menter is a 45 y.o. male who presents unaccompanied to [facility] ED intoxicated and reporting suicidal ideation. Pt says he would have killed himself today if someone had not called emergency services. He says he is very depressed and hopeless and reports current suicidal ideation with plan to step in front of a bus. He reports a history of one previous suicide attempt years ago when he overdosed on a three month supply of psychiatric medication. Pt reports symptoms including crying spells, social withdrawal, loss of interest in usual pleasures, fatigue, irritability, decreased concentration, decreased sleep, decreased appetite and feelings of guilt and hopelessness. He denies homicidal ideation or history of violence. He denies history of psychotic symptoms except when he is experiences alcohol withdrawal, when he hears music that isn't there.  Pt reports he began drinking  alcohol at age 15 and by age [age] was drinking on a regular basis. He states he drinks as much alcohol as he can get, including beer, wine, liquor or mouthwash. Pt's blood alcohol level upon arrival to [facility] was 399. He reports his longest period of sobriety is three months while incarcerated. Pt denies other substance use and urine drug screen is pending.  Pt reports he has been homeless and on the street for approximately three months. 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 Pt reports acute back pain.  However, ED records indicate this a chronic complaint. Pt reports sustaining an injury "that I can't remember. But I was very drunk. I mean before two days ago I was able to walk two miles a day no problem. I can barely move right now." Nursing staff in the Observation Unit report that the patient's gait is very unsteady and that he is requiring two person assistance to the bathroom currently. Pt is also having significant detox symptoms at present.   He cannot identify any family or friends who are supportive. He has three children, a son age 75 and sixteen-year-old twins who live with their mother. He is uncertain whether he has any current legal problems. Pt says he has been to multiple treatment facilities over the years, the most recent being at [facility] in April 2017. Today the pt reports withdrawal symptoms of diarrhea, tremors, anxiety, and sweats.   Pt spent the night in [place] without incident and is prepared for outpatient followup as below.   Past Psychiatric History: depression, alcohol dependence  Risk to Self: Suicidal Ideation: Yes-Currently Present Suicidal Intent: Yes-Currently Present Is patient at risk for suicide?: Yes Suicidal Plan?: Yes-Currently Present Specify Current  Suicidal Plan: Plan to step in front of a bus Access to Means: Yes Specify Access to Suicidal Means: Access to traffic What has been your use of drugs/alcohol within the last 12 months?: Pt  drinking large quantities of alcohol How many times?: 1 Other Self Harm Risks: Pt intoxicated Triggers for Past Attempts: Other (Comment) (Intoxication) Intentional Self Injurious Behavior: None Risk to Others: Homicidal Ideation: No Thoughts of Harm to Others: No Current Homicidal Intent: No Current Homicidal Plan: No Access to Homicidal Means: No Identified Victim: None History of harm to others?: No Assessment of Violence: None Noted Violent Behavior Description: Pt denies history of violence Does patient have access to weapons?: No Criminal Charges Pending?: No Does patient have a court date: No Prior Inpatient Therapy: Prior Inpatient Therapy: Yes Prior Therapy Dates: 09/2015, multiple admits Prior Therapy Facilty/Provider(s): [facility], [facility], other facilities Reason for Treatment: Depression, alcohol dependence Prior Outpatient Therapy: Prior Outpatient Therapy: Yes Prior Therapy Dates: unknown Prior Therapy Facilty/Provider(s): unknown Reason for Treatment: Depression, alcohol dependence Does patient have an ACCT team?: No Does patient have Intensive In-House Services? : No Does patient have [facility] services? : No Does patient have P4CC services?: No  Past Medical History:  Past Medical History  Diagnosis Date  . Alcohol abuse   . Depression   . Bipolar 1 disorder, depressed ([place])     Past Surgical History  Procedure Laterality Date  . No past surgeries     Family History:  Family History  Problem Relation Age of Onset  . Hypertension Mother   . Cancer Father   . Heart failure Father   . Alcoholism Father    Family Psychiatric History: Patient reports that both parents "battled alcohol addiction."  Social History:  History  Alcohol Use  . Yes    Comment: daily - "whatever I can get" - 1/5 plus daily    History  Drug Use No    Social History   Social History  . Marital Status: Single     Spouse Name: N/A  . Number of Children: N/A  . Years of Education: N/A   Social History Main Topics  . Smoking status: Current Every Day Smoker  . Smokeless tobacco: None  . Alcohol Use: Yes     Comment: daily - "whatever I can get" - 1/5 plus daily  . Drug Use: No  . Sexual Activity: No   Other Topics Concern  . None   Social History Narrative   Additional Social History:    Allergies: No Known Allergies  Labs:   Lab Results Last 48 Hours    Results for orders placed or performed during the hospital encounter of 12/12/15 (from the past 48 hour(s))  Comprehensive metabolic panel Status: Abnormal   Collection Time: 12/12/15 9:30 AM  Result Value Ref Range   Sodium 138 135 - 145 mmol/L   Potassium 4.0 3.5 - 5.1 mmol/L   Chloride 99 (L) 101 - 111 mmol/L   CO2 27 22 - 32 mmol/L   Glucose, Bld 118 (H) 65 - 99 mg/dL   BUN 8 6 - 20 mg/dL   Creatinine, Ser 0.58 (L) 0.61 - 1.24 mg/dL   Calcium 8.8 (L) 8.9 - 10.3 mg/dL   Total Protein 7.6 6.5 - 8.1 g/dL   Albumin 4.3 3.5 - 5.0 g/dL   AST 83 (H) 15 - 41 U/L   ALT 32 17 - 63 U/L   Alkaline Phosphatase 101 38 - 126 U/L   Total Bilirubin  0.2 (L) 0.3 - 1.2 mg/dL   GFR calc non Af Amer >60 >60 mL/min   GFR calc Af Amer >60 >60 mL/min    Comment: (NOTE) The eGFR has been calculated using the CKD EPI equation. This calculation has not been validated in all clinical situations. eGFR's persistently <60 mL/min signify possible Chronic Kidney Disease.    Anion gap 12 5 - 15  Ethanol Status: Abnormal   Collection Time: 12/12/15 9:30 AM  Result Value Ref Range   Alcohol, Ethyl (B) 399 (HH) <5 mg/dL    Comment: CRITICAL RESULT CALLED TO, READ BACK BY AND VERIFIED WITH: LEONARD,S AT 10:00AM ON 12/12/15 BY FESTERMAN,C   LOWEST DETECTABLE LIMIT FOR SERUM ALCOHOL IS 5 mg/dL FOR  MEDICAL PURPOSES ONLY   cbc Status: Abnormal   Collection Time: 12/12/15 9:30 AM  Result Value Ref Range   WBC 7.2 4.0 - 10.5 K/uL   RBC 4.43 4.22 - 5.81 MIL/uL   Hemoglobin 14.3 13.0 - 17.0 g/dL   HCT 39.3 39.0 - 52.0 %   MCV 88.7 78.0 - 100.0 fL   MCH 32.3 26.0 - 34.0 pg   MCHC 36.4 (H) 30.0 - 36.0 g/dL   RDW 15.7 (H) 11.5 - 15.5 %   Platelets 39 (L) 150 - 400 K/uL    Comment: RESULT REPEATED AND VERIFIED SPECIMEN CHECKED FOR CLOTS PLATELET COUNT CONFIRMED BY SMEAR   Rapid urine drug screen (hospital performed) Status: None   Collection Time: 12/12/15 6:40 PM  Result Value Ref Range   Opiates NONE DETECTED NONE DETECTED   Cocaine NONE DETECTED NONE DETECTED   Benzodiazepines NONE DETECTED NONE DETECTED   Amphetamines NONE DETECTED NONE DETECTED   Tetrahydrocannabinol NONE DETECTED NONE DETECTED   Barbiturates NONE DETECTED NONE DETECTED    Comment:   DRUG SCREEN FOR MEDICAL PURPOSES ONLY. IF CONFIRMATION IS NEEDED FOR ANY PURPOSE, NOTIFY LAB WITHIN 5 DAYS.   LOWEST DETECTABLE LIMITS FOR URINE DRUG SCREEN Drug Class Cutoff (ng/mL) Amphetamine 1000 Barbiturate 200 Benzodiazepine 808 Tricyclics 811 Opiates 031 Cocaine 300 THC 50       Current Facility-Administered Medications  Medication Dose Route Frequency Provider Last Rate Last Dose  . acamprosate (CAMPRAL) tablet 666 mg 666 mg Oral TID WC Mojeed Akintayo, MD  666 mg at 12/13/15 1132  . cloNIDine (CATAPRES) tablet 0.1 mg 0.1 mg Oral BID Patrecia Pour, NP  0.1 mg at 12/13/15 1132  . FLUoxetine (PROZAC) capsule 20 mg 20 mg Oral Daily Mojeed Akintayo, MD  20 mg at 12/13/15 1132  . gabapentin (NEURONTIN) capsule 300 mg 300 mg Oral TID Corena Pilgrim, MD  300 mg at 12/13/15 1133  . hydrOXYzine  (ATARAX/VISTARIL) tablet 50 mg 50 mg Oral QHS PRN Corena Pilgrim, MD    . LORazepam (ATIVAN) tablet 0-4 mg 0-4 mg Oral Q6H Harvel Quale, MD  Stopped at 12/13/15 5945   Followed by  . [START ON 12/15/2015] LORazepam (ATIVAN) tablet 0-4 mg 0-4 mg Oral Q12H Harvel Quale, MD    . OLANZapine [facility]) tablet 10 mg 10 mg Oral QHS Mojeed Akintayo, MD    . pantoprazole (PROTONIX) EC tablet 20 mg 20 mg Oral Daily Corena Pilgrim, MD  20 mg at 12/13/15 1132  . traZODone (DESYREL) tablet 150 mg 150 mg Oral QHS Corena Pilgrim, MD     Current Outpatient Prescriptions  Medication Sig Dispense Refill  . acamprosate (CAMPRAL) 333 MG tablet Take 2 tablets (666 mg total) by mouth 3 (three) times daily with  meals. For alcoholism, (Patient not taking: Reported on 07/26/2015) 180 tablet 0  . cephALEXin (KEFLEX) 500 MG capsule Take 1 capsule (500 mg total) by mouth 2 (two) times daily. (Patient not taking: Reported on 12/12/2015) 14 capsule 0  . FLUoxetine (PROZAC) 20 MG capsule Take 1 capsule (20 mg total) by mouth daily. For depression 30 capsule 0  . gabapentin (NEURONTIN) 300 MG capsule Take 1 capsule (300 mg total) by mouth 3 (three) times daily. For agitation 90 capsule 0  . hydrOXYzine (ATARAX/VISTARIL) 50 MG tablet Take 1 tablet (50 mg total) by mouth at bedtime as needed for anxiety (insomnia). 30 tablet 0  . ibuprofen (ADVIL,MOTRIN) 600 MG tablet Take 1 tablet (600 mg total) by mouth every 6 (six) hours as needed. (Patient not taking: Reported on 12/12/2015) 30 tablet 0  . OLANZapine (ZYPREXA) 10 MG tablet Take 1 tablet (10 mg total) by mouth at bedtime. For mood control 30 tablet 0  . pantoprazole (PROTONIX) 20 MG tablet Take 1 tablet (20 mg total) by mouth daily. (Patient not taking: Reported on 12/12/2015) 30 tablet 0  . traZODone (DESYREL) 150 MG tablet Take 1 tablet (150 mg total) by  mouth at bedtime. 30 tablet 0    Musculoskeletal: Strength & Muscle Tone: within normal limits Gait & Station: normal Patient leans: N/A  Psychiatric Specialty Exam:    Review of Systems  Constitutional: Negative.  HENT: Negative.  Eyes: Negative.  Respiratory: Negative.  Cardiovascular: Negative.  Gastrointestinal: Negative.  Genitourinary: Negative.  Musculoskeletal: Negative.  Skin: Negative.  Neurological: Negative.  Endo/Heme/Allergies: Negative.  Psychiatric/Behavioral: Positive for depression and substance abuse. The patient is nervous/anxious.    BP 131/87 mmHg  Pulse 90  Temp(Src) 98.2 F (36.8 C) (Oral)  Resp 18  Ht 6' (1.829 m)  Wt 82.555 kg (182 lb)  BMI 24.68 kg/m2  SpO2 98% Psychiatric Specialty Exam: Physical Exam  ROS  Blood pressure 131/87, pulse 90, temperature 98.2 F (36.8 C), temperature source Oral, resp. rate 18, height 6' (1.829 m), weight 82.555 kg (182 lb), SpO2 98 %.Body mass index is 24.68 kg/(m^2).  General Appearance: Casual and Fairly Groomed  Eye Contact:  Good  Speech:  Clear and Coherent and Normal Rate  Volume:  Normal  Mood:  Anxious and Depressed  Affect:  Appropriate, Congruent and Depressed  Thought Process:  Goal Directed, Linear and Descriptions of Associations: Intact  Orientation:  Full (Time, Place, and Person)  Thought Content:  Symptoms, worries, concerns  Suicidal Thoughts:  No  Homicidal Thoughts:  No  Memory:  Immediate;   Fair Recent;   Fair Remote;   Fair  Judgement:  Fair  Insight:  Fair  Psychomotor Activity:  Normal  Concentration:  Concentration: Fair and Attention Span: Fair  Recall:  [employer] of Knowledge:  Fair  Language:  Fair  Akathisia:  No  Handed:    AIMS (if indicated):     Assets:  Communication Skills Desire for Improvement Resilience Social Support Talents/Skills  ADL's:  Intact  Cognition:  WNL  Sleep:           Treatment Plan Summary: Alcohol-induced  mood disorder ([place]), stable for outpatient management  Medications:  -Vistaril 19m po qhs prn insomnia -Prozac 231mpo daily for MDD -Neurontin 30072m tid for mood stabilization   Disposition: Discharge home with outpatient resources for psychiatry and counseling   WitBenjamine MolaNP 12/15/2015 9:41 AM

## 2015-12-16 ENCOUNTER — Encounter (HOSPITAL_COMMUNITY): Payer: Self-pay

## 2015-12-16 ENCOUNTER — Emergency Department (HOSPITAL_COMMUNITY)
Admission: EM | Admit: 2015-12-16 | Discharge: 2015-12-16 | Payer: Self-pay | Attending: Emergency Medicine | Admitting: Emergency Medicine

## 2015-12-16 DIAGNOSIS — S01111A Laceration without foreign body of right eyelid and periocular area, initial encounter: Secondary | ICD-10-CM | POA: Insufficient documentation

## 2015-12-16 DIAGNOSIS — Y939 Activity, unspecified: Secondary | ICD-10-CM | POA: Insufficient documentation

## 2015-12-16 DIAGNOSIS — Y929 Unspecified place or not applicable: Secondary | ICD-10-CM | POA: Insufficient documentation

## 2015-12-16 DIAGNOSIS — F319 Bipolar disorder, unspecified: Secondary | ICD-10-CM | POA: Insufficient documentation

## 2015-12-16 DIAGNOSIS — Y999 Unspecified external cause status: Secondary | ICD-10-CM | POA: Insufficient documentation

## 2015-12-16 DIAGNOSIS — S0191XA Laceration without foreign body of unspecified part of head, initial encounter: Secondary | ICD-10-CM | POA: Insufficient documentation

## 2015-12-16 DIAGNOSIS — F17200 Nicotine dependence, unspecified, uncomplicated: Secondary | ICD-10-CM | POA: Insufficient documentation
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
## 2016-01-05 ENCOUNTER — Encounter (HOSPITAL_COMMUNITY): Payer: Self-pay

## 2016-01-05 ENCOUNTER — Emergency Department (HOSPITAL_COMMUNITY)
Admission: EM | Admit: 2016-01-05 | Discharge: 2016-01-05 | Disposition: A | Discharge status: Home / Self Care | Payer: Self-pay | Attending: Emergency Medicine | Admitting: Emergency Medicine

## 2016-01-05 DIAGNOSIS — F10920 Alcohol use, unspecified with intoxication, uncomplicated: Secondary | ICD-10-CM

## 2016-01-05 DIAGNOSIS — F319 Bipolar disorder, unspecified: Secondary | ICD-10-CM | POA: Insufficient documentation

## 2016-01-05 DIAGNOSIS — F10129 Alcohol abuse with intoxication, unspecified: Secondary | ICD-10-CM | POA: Insufficient documentation

## 2016-01-05 DIAGNOSIS — F17200 Nicotine dependence, unspecified, uncomplicated: Secondary | ICD-10-CM | POA: Insufficient documentation

## 2016-01-05 NOTE — ED Notes (Signed)
Pt ambulated in hallway without assistance

## 2016-01-05 NOTE — Discharge Instructions (Signed)
Alcohol Intoxication  Alcohol intoxication occurs when you drink enough alcohol that it affects your ability to function. It can be mild or very severe. Drinking a lot of alcohol in a short time is called binge drinking. This can be very harmful. Drinking alcohol can also be more dangerous if you are taking medicines or other drugs. Some of the effects caused by alcohol may include:  · Loss of coordination.  · Changes in mood and behavior.  · Unclear thinking.  · Trouble talking (slurred speech).  · Throwing up (vomiting).  · Confusion.  · Slowed breathing.  · Twitching and shaking (seizures).  · Loss of consciousness.  HOME CARE  · Do not drive after drinking alcohol.  · Drink enough water and fluids to keep your pee (urine) clear or pale yellow. Avoid caffeine.  · Only take medicine as told by your doctor.  GET HELP IF:  · You throw up (vomit) many times.  · You do not feel better after a few days.  · You frequently have alcohol intoxication. Your doctor can help decide if you should see a substance use treatment counselor.  GET HELP RIGHT AWAY IF:  · You become shaky when you stop drinking.  · You have twitching and shaking.  · You throw up blood. It may look bright red or like coffee grounds.  · You notice blood in your poop (bowel movements).  · You become lightheaded or pass out (faint).  MAKE SURE YOU:   · Understand these instructions.  · Will watch your condition.  · Will get help right away if you are not doing well or get worse.     This information is not intended to replace advice given to you by your health care provider. Make sure you discuss any questions you have with your health care provider.     Document Released: 11/29/2007 Document Revised: 02/12/2013 Document Reviewed: 11/15/2012  Elsevier Interactive Patient Education ©2016 [employer]

## 2016-01-05 NOTE — ED Notes (Signed)
MD at bedside. 

## 2016-01-05 NOTE — ED Provider Notes (Signed)
CSN: [id number]     Arrival date & time 01/05/16  0013 History  By signing my name below, I, Emmanuella Mensah, attest that this documentation has been prepared under the direction and in the presence of Jawaan Adachi, MD. Electronically Signed: Angelene Giovanni, ED Scribe. 01/05/2016. 2:44 AM.    Chief Complaint  Patient presents with  . Alcohol Intoxication   Patient is a 45 y.o. male presenting with intoxication. The history is provided by the patient. No language interpreter was used.  Alcohol Intoxication This is a new problem. The current episode started 3 to 5 hours ago. The problem occurs constantly. The problem has not changed since onset.Pertinent negatives include no chest pain. Nothing aggravates the symptoms. Nothing relieves the symptoms. He has tried nothing for the symptoms. The treatment provided no relief.   HPI Comments: Level 5 due to alcohol intoxication Jordan Davidson is a 45 y.o. male with a hx of alcohol abuse who presents to the Emergency Department for evaluation s/p alcohol intoxication that occurred PTA. Pt states that he had 3 24 oz beers PTA. No other complaints at this time.    Past Medical History  Diagnosis Date  . Alcohol abuse   . Depression   . Bipolar 1 disorder, depressed (HCC)    Past Surgical History  Procedure Laterality Date  . No past surgeries     Family History  Problem Relation Age of Onset  . Hypertension Mother   . Cancer Father   . Heart failure Father   . Alcoholism Father    Social History  Substance Use Topics  . Smoking status: Current Every Day Smoker  . Smokeless tobacco: None  . Alcohol Use: Yes     Comment: daily - "whatever I can get" - 1/5 plus daily    Review of Systems  Unable to perform ROS: Other  Cardiovascular: Negative for chest pain.      Allergies  Review of patient's allergies indicates no known allergies.  Home Medications   Prior to Admission medications   Medication Sig Start Date End Date Taking?  Authorizing Provider  FLUoxetine (PROZAC) 20 MG capsule Take 1 capsule (20 mg total) by mouth daily. For depression Patient not taking: Reported on 01/05/2016 12/15/15   Beau Fanny, FNP  gabapentin (NEURONTIN) 300 MG capsule Take 1 capsule (300 mg total) by mouth 3 (three) times daily. For agitation Patient not taking: Reported on 01/05/2016 12/15/15   Beau Fanny, FNP  hydrOXYzine (ATARAX/VISTARIL) 50 MG tablet Take 1 tablet (50 mg total) by mouth at bedtime as needed for anxiety (insomnia). Patient not taking: Reported on 01/05/2016 12/15/15   Beau Fanny, FNP  OLANZapine (ZYPREXA) 10 MG tablet Take 1 tablet (10 mg total) by mouth at bedtime. Patient not taking: Reported on 01/05/2016 12/15/15   Beau Fanny, FNP  predniSONE (DELTASONE) 20 MG tablet Take 1 tablet (20 mg total) by mouth daily with breakfast. Patient not taking: Reported on 01/05/2016 12/15/15   Beau Fanny, FNP  traZODone (DESYREL) 150 MG tablet Take 1 tablet (150 mg total) by mouth at bedtime. Patient not taking: Reported on 01/05/2016 12/15/15   Everardo All Withrow, FNP   BP 105/62 mmHg  Pulse 87  Temp(Src) 98.2 F (36.8 C) (Oral)  Resp 18  SpO2 94% Physical Exam  Constitutional: He is oriented to person, place, and time. He appears well-developed and well-nourished. No distress.  HENT:  Head: Normocephalic and atraumatic.  Mouth/Throat: Oropharynx is clear and moist. No  oropharyngeal exudate.  Eyes: Conjunctivae and EOM are normal. Pupils are equal, round, and reactive to light.  Neck: Normal range of motion. Neck supple. No tracheal deviation present.  Cardiovascular: Normal rate and regular rhythm.   Pulmonary/Chest: Effort normal and breath sounds normal. No respiratory distress. He has no wheezes. He has no rales.  Abdominal: Soft. Bowel sounds are normal. There is no tenderness. There is no rebound and no guarding.  Musculoskeletal: Normal range of motion.  Neurological: He is alert and oriented to person,  place, and time.  Skin: Skin is warm and dry.  Psychiatric: He has a normal mood and affect. His behavior is normal.  Nursing note and vitals reviewed.   ED Course  Procedures (including critical care time) DIAGNOSTIC STUDIES: Oxygen Saturation is 94% on RA, normal by my interpretation.    COORDINATION OF CARE: 2:39 AM- Pt advised of plan for treatment and pt agrees. Will allow pt to sleep, then walk before discharge.     Labs Review Labs Reviewed - No data to display  Imaging Review No results found.   Kathlen Sakurai, MD has personally reviewed and evaluated these images and lab results as part of her medical decision-making.  MDM   Final diagnoses:  None    Filed Vitals:   01/05/16 0144 01/05/16 0145  BP: 105/62 105/62  Pulse: 87 87  Temp:    Resp:      Now awake and alert and ambulating without assistance and stable for discharge.    I personally performed the services described in this documentation, which was scribed in my presence. The recorded information has been reviewed and is accurate.     Cy BlamerApril Damarkus Balis, MD 01/05/16 [id number]

## 2016-01-15 ENCOUNTER — Emergency Department (HOSPITAL_COMMUNITY)
Admission: EM | Admit: 2016-01-15 | Discharge: 2016-01-16 | Disposition: A | Discharge status: Home / Self Care | Payer: Self-pay | Attending: Emergency Medicine | Admitting: Emergency Medicine

## 2016-01-15 ENCOUNTER — Encounter (HOSPITAL_COMMUNITY): Payer: Self-pay | Admitting: Emergency Medicine

## 2016-01-15 DIAGNOSIS — F319 Bipolar disorder, unspecified: Secondary | ICD-10-CM | POA: Insufficient documentation

## 2016-01-15 DIAGNOSIS — F10129 Alcohol abuse with intoxication, unspecified: Secondary | ICD-10-CM | POA: Insufficient documentation

## 2016-01-15 DIAGNOSIS — F17200 Nicotine dependence, unspecified, uncomplicated: Secondary | ICD-10-CM | POA: Insufficient documentation

## 2016-01-15 DIAGNOSIS — F10920 Alcohol use, unspecified with intoxication, uncomplicated: Secondary | ICD-10-CM

## 2016-01-15 NOTE — ED Notes (Signed)
Brought in by EMS from a side road with c/o alcohol intoxication.  Per EMS, pt was observed lying on the ground by a bystander and called EMS.  Pt unable to stand on his feet on EMS' arrival at the scene--- pt appears heavily intoxicated.  Arrived to ED very sleepy, unable to move self from EMS' stretcher to ED bed.

## 2016-01-15 NOTE — Discharge Instructions (Signed)

## 2016-01-15 NOTE — ED Notes (Signed)
Bed: [facility] Expected date:  Expected time:  Means of arrival:  Comments: etoh 

## 2016-01-15 NOTE — ED Provider Notes (Signed)
CSN: [id number]     Arrival date & time 01/15/16  2101 History   First MD Initiated Contact with Patient 01/15/16 2117     Chief Complaint  Patient presents with  . Alcohol Intoxication   HPI Patient presents to the emergency room with alcohol intoxication. Patient has a history of frequent visits to the emergency room for alcohol abuse. Patient was found lying on the ground. Bystanders called EMS. he was unable to stand when EMS arrived.  Patient was brought into the emergency room for further evaluation. Patient denies any complaints. He admits he was drinking. He says he just wants to be able to sleep. Past Medical History  Diagnosis Date  . Alcohol abuse   . Depression   . Bipolar 1 disorder, depressed (HCC)    Past Surgical History  Procedure Laterality Date  . No past surgeries     Family History  Problem Relation Age of Onset  . Hypertension Mother   . Cancer Father   . Heart failure Father   . Alcoholism Father    Social History  Substance Use Topics  . Smoking status: Current Every Day Smoker  . Smokeless tobacco: None  . Alcohol Use: Yes     Comment: daily - "whatever I can get" - 1/5 plus daily    Review of Systems  All other systems reviewed and are negative.     Allergies  Review of patient's allergies indicates no known allergies.  Home Medications   Prior to Admission medications   Medication Sig Start Date End Date Taking? Authorizing Provider  FLUoxetine (PROZAC) 20 MG capsule Take 1 capsule (20 mg total) by mouth daily. For depression Patient not taking: Reported on 01/05/2016 12/15/15   Beau Fanny, FNP  gabapentin (NEURONTIN) 300 MG capsule Take 1 capsule (300 mg total) by mouth 3 (three) times daily. For agitation Patient not taking: Reported on 01/05/2016 12/15/15   Beau Fanny, FNP  hydrOXYzine (ATARAX/VISTARIL) 50 MG tablet Take 1 tablet (50 mg total) by mouth at bedtime as needed for anxiety (insomnia). Patient not taking: Reported on  01/05/2016 12/15/15   Beau Fanny, FNP  OLANZapine (ZYPREXA) 10 MG tablet Take 1 tablet (10 mg total) by mouth at bedtime. Patient not taking: Reported on 01/05/2016 12/15/15   Beau Fanny, FNP  predniSONE (DELTASONE) 20 MG tablet Take 1 tablet (20 mg total) by mouth daily with breakfast. Patient not taking: Reported on 01/05/2016 12/15/15   Beau Fanny, FNP  traZODone (DESYREL) 150 MG tablet Take 1 tablet (150 mg total) by mouth at bedtime. Patient not taking: Reported on 01/05/2016 12/15/15   Beau Fanny, FNP   There were no vitals taken for this visit. Physical Exam  Constitutional: He is oriented to person, place, and time. He appears well-developed and well-nourished. No distress.  HENT:  Head: Normocephalic and atraumatic.  Right Ear: External ear normal.  Left Ear: External ear normal.  Mouth/Throat: No oropharyngeal exudate.  Eyes: Conjunctivae are normal. Right eye exhibits no discharge. Left eye exhibits no discharge. No scleral icterus.  Neck: Neck supple. No tracheal deviation present.  Cardiovascular: Normal rate, regular rhythm and normal heart sounds.   Pulmonary/Chest: Effort normal and breath sounds normal. No stridor. No respiratory distress. He has no wheezes.  Musculoskeletal: He exhibits no edema.  Neurological: He is alert and oriented to person, place, and time. Cranial nerve deficit: No facial droop, normal speech.  Skin: Skin is warm and dry. No rash noted.  He is not diaphoretic.  Psychiatric: He has a normal mood and affect.  Nursing note and vitals reviewed.   ED Course  Procedures (including critical care time)   MDM   Final diagnoses:  Alcohol intoxication, uncomplicated (HCC)    Prior records are reviewed. Patient has a care plan. He is exhausted all prior resources for substance abuse counseling. Patient appears medically stable.  Will allow him to sober up until he is safe to be discharged.    Linwood Dibbles, MD 01/15/16 2204

## 2016-01-17 ENCOUNTER — Encounter (HOSPITAL_COMMUNITY): Payer: Self-pay

## 2016-01-17 ENCOUNTER — Emergency Department (HOSPITAL_COMMUNITY)
Admission: EM | Admit: 2016-01-17 | Discharge: 2016-01-18 | Disposition: A | Discharge status: Home / Self Care | Payer: Self-pay | Attending: Emergency Medicine | Admitting: Emergency Medicine

## 2016-01-17 ENCOUNTER — Emergency Department (HOSPITAL_COMMUNITY): Payer: Self-pay

## 2016-01-17 DIAGNOSIS — Y939 Activity, unspecified: Secondary | ICD-10-CM | POA: Insufficient documentation

## 2016-01-17 DIAGNOSIS — W1800XA Striking against unspecified object with subsequent fall, initial encounter: Secondary | ICD-10-CM | POA: Insufficient documentation

## 2016-01-17 DIAGNOSIS — F17200 Nicotine dependence, unspecified, uncomplicated: Secondary | ICD-10-CM | POA: Insufficient documentation

## 2016-01-17 DIAGNOSIS — Y929 Unspecified place or not applicable: Secondary | ICD-10-CM | POA: Insufficient documentation

## 2016-01-17 DIAGNOSIS — F319 Bipolar disorder, unspecified: Secondary | ICD-10-CM | POA: Insufficient documentation

## 2016-01-17 DIAGNOSIS — S0003XA Contusion of scalp, initial encounter: Secondary | ICD-10-CM | POA: Insufficient documentation

## 2016-01-17 DIAGNOSIS — Y999 Unspecified external cause status: Secondary | ICD-10-CM | POA: Insufficient documentation

## 2016-01-17 NOTE — ED Provider Notes (Signed)
[facility]-EMERGENCY DEPT Provider Note   CSN: [id number] Arrival date & time: 01/17/16  2102  First Provider Contact:  9:40 PM   By signing my name below, I, [name], attest that this documentation has been prepared under the direction and in the presence of [employer], PA-C.  Electronically Signed: [name], ED Scribe. 01/17/16. 9:54 PM.  History   Chief Complaint Chief Complaint  Patient presents with  . Alcohol Intoxication   LEVEL 5 CAVEAT: HPI and ROS limited due to alcohol intoxication  The history is provided by the patient. The history is limited by the condition of the patient. No language interpreter was used.    HPI Comments: Jordan Davidson is a 45 y.o. male BIB EMS with a PMHx of EtOH abuse, depression, and Bipolar 1 disorder, who presents to the Emergency Department s/p ground-level fall that occurred PTA. Pt also notes that he is currently intoxicated, and that he drank 3 40oz beers PTA. Pt reports that while drinking that he fell and hit the back of his head. No LOC. He denies neck pain.   Past Medical History:  Diagnosis Date  . Alcohol abuse   . Bipolar 1 disorder, depressed (HCC)   . Depression     Patient Active Problem List   Diagnosis Date Noted  . Major depressive disorder, recurrent episode, moderate (HCC) 12/13/2015  . 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 Bipolar I disorder, most recent episode (or current) unspecified 02/14/2015  . Alcohol use disorder, severe, dependence (HCC) 02/13/2015    Past Surgical History:  Procedure Laterality Date  . NO PAST SURGERIES      Home Medications    Prior to Admission medications   Not on File    Family History Family History  Problem Relation Age of Onset  . Hypertension Mother   . Cancer Father   . Heart failure Father   . Alcoholism Father     Social History Social History  Substance Use Topics  . Smoking status: Current Every Day Smoker  . Smokeless tobacco:  Not on file  . Alcohol use Yes     Comment: daily - "whatever I can get" - 1/5 plus daily     Allergies   Review of patient's allergies indicates no known allergies.   Review of Systems Review of Systems LEVEL 5 CAVEAT: HPI and ROS limited due to alcohol intoxication  Physical Exam Updated Vital Signs BP 121/85 (BP Location: Left Arm)   Pulse 102   Temp 98.1 F (36.7 C) (Oral)   Resp 18   Wt 82.6 kg   SpO2 95%   BMI 24.68 kg/m   Physical Exam  Constitutional: He is oriented to person, place, and time. He appears well-developed and well-nourished. No distress.  Nontoxic appearing  HENT:  Head: Normocephalic. Head is with abrasion. Head is without raccoon's eyes, without Battle's sign and without laceration.    No skull instability, battle's sign or raccoon's eyes.  Eyes: Conjunctivae and EOM are normal. No scleral icterus.  Neck: Normal range of motion.  No cervical midline TTP. No bony deformities, step offs, or crepitus.  Pulmonary/Chest: Effort normal. No respiratory distress.  Musculoskeletal: Normal range of motion.  Neurological: He is alert and oriented to person, place, and time.  GCS 15. Patient with goal oriented speech. He answers questions appropriately and follows commands. Moving all extremities.  Skin: Skin is warm and dry. No rash noted. He is not diaphoretic. No erythema. No pallor.  Psychiatric:  He has a normal mood and affect. His behavior is normal.  Nursing note and vitals reviewed.    ED Treatments / Results  DIAGNOSTIC STUDIES: Oxygen Saturation is 92% on RA, low by my interpretation.   COORDINATION OF CARE: 9:55 PM-Discussed next steps with pt. Pt verbalized understanding and is agreeable with the plan.   Labs (all labs ordered are listed, but only abnormal results are displayed) Labs Reviewed - No data to display  EKG  EKG Interpretation None       Radiology Ct Head Wo Contrast  Result Date: 01/17/2016 CLINICAL DATA:   Questionable fall.  History of alcohol abuse. EXAM: CT HEAD WITHOUT CONTRAST CT CERVICAL SPINE WITHOUT CONTRAST TECHNIQUE: Multidetector CT imaging of the head and cervical spine was performed following the standard protocol without intravenous contrast. Multiplanar CT image reconstructions of the cervical spine were also generated. COMPARISON:  Head and C-spine CT - 07/28/2015; head CT - 03/18/2015 FINDINGS: CT HEAD FINDINGS There is soft tissue swelling about the right posterior parietal calvarium (representative images 22 and 23, series 3). This finding is without associated radiopaque foreign body or displaced calvarial fracture. Mild age advanced atrophy with sulcal prominence. The gray-white differentiation is otherwise well maintained without CT evidence of acute large territory infarct. No intraparenchymal or extra-axial mass or hemorrhage. Normal size and configuration of the ventricles and basilar cisterns. No midline shift. Minimal amount of intracranial atherosclerosis. Limited visualization of the paranasal sinuses and mastoid air cells is normal. No air-fluid levels. CT CERVICAL SPINE FINDINGS C1 to the superior endplate of T2 is imaged. There is straightening of the expected cervical lordosis. No anterolisthesis or retrolisthesis. The bilateral facets are normally aligned. The dens is normally positioned between the lateral masses of C1. Normal atlantodental and atlantoaxial articulations. No fracture or static subluxation of the cervical spine. Cervical vertebral body heights are preserved. Prevertebral soft tissues are normal. 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 No bulky cervical lymphadenopathy on this noncontrast examination. Calcified plaque within the bilateral carotid bulbs. Normal noncontrast appearance of the thyroid gland. Limited visualization of lung apices is normal. IMPRESSION: 1.  Minimal amount of soft tissue swelling about the right posterior parietal calvarium without associated radiopaque foreign body, displaced calvarial fracture or acute intracranial process. 2. No fracture or static subluxation of the cervical spine. 3. 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 Electronically Signed   By: Simonne Come M.D.   On: 01/17/2016 22:25  Ct Cervical Spine Wo Contrast  Result Date: 01/17/2016 CLINICAL DATA:  Questionable fall.  History of alcohol abuse. EXAM: CT HEAD WITHOUT CONTRAST CT CERVICAL SPINE WITHOUT CONTRAST TECHNIQUE: Multidetector CT imaging of the head and cervical spine was performed following the standard protocol without intravenous contrast. Multiplanar CT image reconstructions of the cervical spine were also generated. COMPARISON:  Head and C-spine CT - 07/28/2015; head CT - 03/18/2015 FINDINGS: CT HEAD FINDINGS There is soft tissue swelling about the right posterior parietal calvarium (representative images 22 and 23, series 3). This finding is without associated radiopaque foreign body or displaced calvarial fracture. Mild age advanced atrophy with sulcal prominence. The gray-white differentiation is otherwise well maintained without CT evidence of acute large territory infarct. No intraparenchymal or extra-axial mass or hemorrhage. Normal size and configuration of the ventricles and basilar cisterns. No midline shift. Minimal amount of intracranial atherosclerosis. Limited visualization of the paranasal sinuses and mastoid air cells is normal. No  air-fluid levels. CT CERVICAL SPINE FINDINGS C1 to the superior endplate of T2 is imaged. There is straightening of the expected cervical lordosis. No anterolisthesis or retrolisthesis. The bilateral facets are normally aligned. The dens is normally positioned between the lateral masses of C1. Normal atlantodental and atlantoaxial articulations. No fracture or static subluxation of the cervical spine. Cervical vertebral  body heights are preserved. Prevertebral soft tissues are normal. 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 No bulky cervical lymphadenopathy on this noncontrast examination. Calcified plaque within the bilateral carotid bulbs. Normal noncontrast appearance of the thyroid gland. Limited visualization of lung apices is normal. IMPRESSION: 1. Minimal amount of soft tissue swelling about the right posterior parietal calvarium without associated radiopaque foreign body, displaced calvarial fracture or acute intracranial process. 2. No fracture or static subluxation of the cervical spine. 3. 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 Electronically Signed   By: Simonne Come M.D.   On: 01/17/2016 22:25   Procedures Procedures (including critical care time)  Medications Ordered in ED Medications  LORazepam (ATIVAN) tablet 1 mg (1 mg Oral Given 01/18/16 0502)     Initial Impression / Assessment and Plan / ED Course  I have reviewed the triage vital signs and the nursing notes.  Pertinent labs & imaging results that were available during my care of the patient were reviewed by me and considered in my medical decision making (see chart for details).  Clinical Course    Patient with hx of ETOH abuse and dependence, well known to the ED, presents for evaluation after an unwitnessed fall. Patient denies any known falls recently. Obvious abrasion noted to posterior scalp with associated contusion. No battle's sign, racoon's eyes, or skull instability. No focal neurologic deficits noted. Patient clinically sober on arrival.  CT imaging of head and C-spine negative for acute intracranial injury. Patient allowed to sleep/further sober in the ED. He was given 1mg  Ativan for withdrawal symptoms. Vitals stable. Patient reassessed in the AM. Found to be alert and oriented, with no complaints. No indication for  further work up or monitoring. Patient discharged with instruction for PCP follow up. Resource guide given. Patient discharged in satisfactory condition with no unaddressed concerns.   Final Clinical Impressions(s) / ED Diagnoses   Final diagnoses:  Contusion of scalp, initial encounter    New Prescriptions New Prescriptions   No medications on file   I personally performed the services described in this documentation, which was scribed in my presence. The recorded information has been reviewed and is accurate.       Antony Madura, PA-C 01/18/16 1610    Leta Baptist, MD 01/19/16 [id number]

## 2016-01-17 NOTE — ED Notes (Addendum)
Pt repeatedly told not to get up out of the bed and continues to do so. Pt refuses to use the call bell after being told multiple times how to use it. Pt cursing and demanding a sandwich.

## 2016-01-17 NOTE — ED Notes (Signed)
Bed: RESA Expected date:  Expected time:  Means of arrival:  Comments: Fall, head injury, heat stroke

## 2016-01-17 NOTE — ED Notes (Signed)
Pt states "If I can have one more sandwich, I'll just sleep here the rest of the night."

## 2016-01-17 NOTE — ED Notes (Addendum)
Pt given socks to wear.

## 2016-01-17 NOTE — ED Triage Notes (Signed)
Pt brought in from a parking lot on [address]. Pt presents with abrasion to the back of head. Denies LOC. ETOH on board. A&Ox4.

## 2016-01-17 NOTE — ED Notes (Signed)
Pt transferred to CT.

## 2016-01-17 NOTE — ED Notes (Signed)
Pt removed his socks and threw them on the ground.

## 2016-01-17 NOTE — Progress Notes (Signed)
Patient noted to have been seen in the ED 16 times within the last 6 months.  Patient noted to be uninsured.  Patient's pcp noted to be [employer] at the [facility].  Patient recently discharged from the [facility] Obs unit on 06/21.  Patient declined further outpatient resources for counseling and psychiatry post [facility] discharge per chart review.

## 2016-01-18 MED ORDER — LORAZEPAM 1 MG PO TABS
1.0000 mg | ORAL_TABLET | Freq: Once | ORAL | Status: AC
  Administered 2016-01-18: 1 mg via ORAL
  Filled 2016-01-18: qty 1

## 2016-01-18 NOTE — ED Notes (Signed)
Pt instructed to change. Will check on pt in 5 minutes to complete d/c.

## 2016-01-18 NOTE — ED Notes (Signed)
Steady rise and fall of pt respirations noted. Pt asleep at this time.

## 2016-01-18 NOTE — ED Notes (Signed)
Pt able to eat, drink, and urinate without any assistance.

## 2016-03-25 ENCOUNTER — Emergency Department (HOSPITAL_COMMUNITY)
Admission: EM | Admit: 2016-03-25 | Discharge: 2016-03-26 | Disposition: A | Discharge status: Home / Self Care | Payer: Self-pay | Attending: Emergency Medicine | Admitting: Emergency Medicine

## 2016-03-25 ENCOUNTER — Encounter (HOSPITAL_COMMUNITY): Payer: Self-pay | Admitting: *Deleted

## 2016-03-25 DIAGNOSIS — L03116 Cellulitis of left lower limb: Secondary | ICD-10-CM

## 2016-03-25 DIAGNOSIS — M7032 Other bursitis of elbow, left elbow: Secondary | ICD-10-CM | POA: Insufficient documentation

## 2016-03-25 DIAGNOSIS — Y9389 Activity, other specified: Secondary | ICD-10-CM | POA: Insufficient documentation

## 2016-03-25 DIAGNOSIS — F17200 Nicotine dependence, unspecified, uncomplicated: Secondary | ICD-10-CM | POA: Insufficient documentation

## 2016-03-25 DIAGNOSIS — M7022 Olecranon bursitis, left elbow: Secondary | ICD-10-CM

## 2016-03-25 DIAGNOSIS — Z23 Encounter for immunization: Secondary | ICD-10-CM | POA: Insufficient documentation

## 2016-03-25 MED ORDER — CEPHALEXIN 500 MG PO CAPS: 500.0000 mg | ORAL_CAPSULE | Freq: Four times a day (QID) | ORAL | 0 refills | Status: DC

## 2016-03-25 MED ORDER — CEPHALEXIN 500 MG PO CAPS
500.0000 mg | ORAL_CAPSULE | Freq: Once | ORAL | Status: AC
  Administered 2016-03-25: 500 mg via ORAL
  Filled 2016-03-25: qty 1

## 2016-03-25 MED ORDER — TETANUS-DIPHTH-ACELL PERTUSSIS 5-2.5-18.5 LF-MCG/0.5 IM SUSP
0.5000 mL | Freq: Once | INTRAMUSCULAR | Status: AC
  Administered 2016-03-25: 0.5 mL via INTRAMUSCULAR
  Filled 2016-03-25: qty 0.5

## 2016-03-25 NOTE — ED Provider Notes (Signed)
[facility]-EMERGENCY DEPT Provider Note   CSN: [id number] Arrival date & time: 03/25/16  2247  By signing my name below, I, Majel HomerPeyton Lee, attest that this documentation has been prepared under the direction and in the presence of non-physician practitioner, Wynetta EmeryNicole Veronica Fretz, PA-C. Electronically Signed: Majel HomerPeyton Lee, Scribe. 03/25/2016. 11:29 PM.  History   Chief Complaint Chief Complaint  Patient presents with  . Recurrent Skin Infections   The history is provided by the patient. No language interpreter was used.   HPI Comments: Jordan Davidson is a 45 y.o. male who presents to the Emergency Department complaining of gradually worsening, pain, redness and swelling to his left knee that began 3 days ago. Pt reports his pain is exacerbated with palpation and when bending his knee. He states he is unsure of his last tetanus immunization. He notes associated warmth to the area. He reports it is difficult for him to ambulate "at first" but it becomes easier with longer durations of movement. Pt also complains of pain and swelling to his left elbow for the past week. He denies fever.   Past Medical History:  Diagnosis Date  . Alcohol abuse   . Bipolar 1 disorder, depressed (HCC)   . Depression     Patient Active Problem List   Diagnosis Date Noted  . Major depressive disorder, recurrent episode, moderate (HCC) 12/13/2015  . 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 Bipolar I disorder, most recent episode (or current) unspecified 02/14/2015  . Alcohol use disorder, severe, dependence (HCC) 02/13/2015    Past Surgical History:  Procedure Laterality Date  . NO PAST SURGERIES      Home Medications    Prior to Admission medications   Medication Sig Start Date End Date Taking? Authorizing Provider  cephALEXin (KEFLEX) 500 MG capsule Take 1 capsule (500 mg total) by mouth 4 (four) times daily. 03/25/16   Joni ReiningNicole Emagene Merfeld, PA-C    Family History Family History  Problem Relation Age of Onset   . Hypertension Mother   . Cancer Father   . Heart failure Father   . Alcoholism Father     Social History Social History  Substance Use Topics  . Smoking status: Current Every Day Smoker  . Smokeless tobacco: Never Used  . Alcohol use Yes     Comment: daily - "whatever I can get" - 1/5 plus daily     Allergies   Review of patient's allergies indicates no known allergies.   Review of Systems Review of Systems   10 systems reviewed and all are negative for acute change except as noted in the HPI.  Physical Exam Updated Vital Signs BP 123/76 (BP Location: Right Arm)   Pulse 98   Temp 97.9 F (36.6 C) (Oral)   Resp 18   SpO2 96%   Physical Exam  Constitutional: He is oriented to person, place, and time. He appears well-developed and well-nourished. No distress.  HENT:  Head: Normocephalic and atraumatic.  Mouth/Throat: Oropharynx is clear and moist.  Eyes: Conjunctivae and EOM are normal. Pupils are equal, round, and reactive to light.  Neck: Normal range of motion.  Cardiovascular: Normal rate, regular rhythm and intact distal pulses.   Pulmonary/Chest: Effort normal and breath sounds normal.  Abdominal: Soft. There is no tenderness.  Musculoskeletal: Normal range of motion.  Left elbow with bursitis, no warmth, tenderness to palpation or overlying cellulitis.  Left knee on the inferior aspect with puncture wound and 3 x 5 cm of erythema and trace warmth. No fluctuance,  full active range of motion to knee. Distally neurovascularly intact.  Neurological: He is alert and oriented to person, place, and time.  Skin: He is not diaphoretic.  Psychiatric: He has a normal mood and affect.  Nursing note and vitals reviewed.    ED Treatments / Results  Labs (all labs ordered are listed, but only abnormal results are displayed) Labs Reviewed - No data to display  EKG  EKG Interpretation None       Radiology No results found.  Procedures Procedures (including  critical care time)  Medications Ordered in ED Medications  cephALEXin (KEFLEX) capsule 500 mg (not administered)  Tdap (BOOSTRIX) injection 0.5 mL (not administered)    DIAGNOSTIC STUDIES:  Oxygen Saturation is 96% on RA, normal by my interpretation.    COORDINATION OF CARE:  11:27 PM Discussed treatment plan with pt at bedside and pt agreed to plan.  Initial Impression / Assessment and Plan / ED Course  I have reviewed the triage vital signs and the nursing notes.  Pertinent labs & imaging results that were available during my care of the patient were reviewed by me and considered in my medical decision making (see chart for details).  Clinical Course    Vitals:   03/25/16 2255  BP: 123/76  Pulse: 98  Resp: 18  Temp: 97.9 F (36.6 C)  TempSrc: Oral  SpO2: 96%    Medications  cephALEXin (KEFLEX) capsule 500 mg (not administered)  Tdap (BOOSTRIX) injection 0.5 mL (not administered)    Jordan Davidson is 45 y.o. male presenting with Left elbow bursitis, he also has a area of erythema which may be the start of an early cellulitis to the left knee. This does not appear to infected joint, he has full active range of motion, doubt septic joint.  Patient with care plan, will honor minimal intervention is recommended. Patient is adamant that I drained the bursitis however, I do not think this is in his best interest as there are no signs of infection. Orthopedic referral given.   Evaluation does not show pathology that would require ongoing emergent intervention or inpatient treatment. Pt is hemodynamically stable and mentating appropriately. Discussed findings and plan with patient/guardian, who agrees with care plan. All questions answered. Return precautions discussed and outpatient follow up given.      I personally performed the services described in this documentation, which was scribed in my presence. The recorded information has been reviewed and is accurate.   Final  Clinical Impressions(s) / ED Diagnoses   Final diagnoses:  Cellulitis of left lower extremity  Bursitis of elbow, left    New Prescriptions New Prescriptions   CEPHALEXIN (KEFLEX) 500 MG CAPSULE    Take 1 capsule (500 mg total) by mouth 4 (four) times daily.     Wynetta Emery, PA-C 03/25/16 2337    Gilda Crease, MD 03/26/16 [phone]

## 2016-03-25 NOTE — Discharge Instructions (Signed)
Please follow with your primary care doctor in the next 2 days for a check-up. They must obtain records for further management.   Do not hesitate to return to the Emergency Department for any new, worsening or concerning symptoms.   If you see signs of infection (warmth, redness, tenderness, pus, sharp increase in pain, fever, red streaking) immediately return to the emergency department.

## 2016-03-25 NOTE — ED Triage Notes (Signed)
Pt complains of pain, redness, and swelling to his left knee for the past 2 days. Pt also has swelling and pain to left elbow for the past week.

## 2016-03-27 ENCOUNTER — Telehealth (HOSPITAL_BASED_OUTPATIENT_CLINIC_OR_DEPARTMENT_OTHER): Payer: Self-pay | Admitting: Emergency Medicine

## 2016-04-28 ENCOUNTER — Emergency Department (HOSPITAL_COMMUNITY): Payer: Self-pay

## 2016-04-28 ENCOUNTER — Inpatient Hospital Stay (HOSPITAL_COMMUNITY)
Admission: EM | Admit: 2016-04-28 | Discharge: 2016-05-03 | DRG: 088 | Disposition: A | Discharge status: Home / Self Care | Payer: Self-pay | Attending: General Surgery | Admitting: General Surgery

## 2016-04-28 DIAGNOSIS — Z590 Homelessness: Secondary | ICD-10-CM

## 2016-04-28 DIAGNOSIS — T794XXA Traumatic shock, initial encounter: Secondary | ICD-10-CM | POA: Diagnosis present

## 2016-04-28 DIAGNOSIS — F1020 Alcohol dependence, uncomplicated: Secondary | ICD-10-CM | POA: Diagnosis present

## 2016-04-28 DIAGNOSIS — E872 Acidosis, unspecified: Secondary | ICD-10-CM

## 2016-04-28 DIAGNOSIS — F1010 Alcohol abuse, uncomplicated: Secondary | ICD-10-CM | POA: Diagnosis present

## 2016-04-28 DIAGNOSIS — F319 Bipolar disorder, unspecified: Secondary | ICD-10-CM | POA: Diagnosis present

## 2016-04-28 DIAGNOSIS — S098XXA Other specified injuries of head, initial encounter: Secondary | ICD-10-CM | POA: Diagnosis present

## 2016-04-28 DIAGNOSIS — S0101XA Laceration without foreign body of scalp, initial encounter: Secondary | ICD-10-CM | POA: Diagnosis present

## 2016-04-28 DIAGNOSIS — R402412 Glasgow coma scale score 13-15, at arrival to emergency department: Secondary | ICD-10-CM | POA: Diagnosis present

## 2016-04-28 DIAGNOSIS — S060X0A Concussion without loss of consciousness, initial encounter: Principal | ICD-10-CM | POA: Diagnosis present

## 2016-04-28 DIAGNOSIS — D62 Acute posthemorrhagic anemia: Secondary | ICD-10-CM | POA: Diagnosis not present

## 2016-04-28 DIAGNOSIS — D696 Thrombocytopenia, unspecified: Secondary | ICD-10-CM | POA: Diagnosis present

## 2016-04-28 HISTORY — DX: Alcohol dependence, uncomplicated: F10.20

## 2016-04-28 HISTORY — DX: Thrombocytopenia, unspecified: D69.6

## 2016-04-28 LAB — COMPREHENSIVE METABOLIC PANEL
ALK PHOS: 75 U/L (ref 38–126)
ALT: 58 U/L (ref 17–63)
ANION GAP: 17 — AB (ref 5–15)
AST: 129 U/L — ABNORMAL HIGH (ref 15–41)
Albumin: 4.4 g/dL (ref 3.5–5.0)
BUN: 7 mg/dL (ref 6–20)
CALCIUM: 8.8 mg/dL — AB (ref 8.9–10.3)
CO2: 22 mmol/L (ref 22–32)
Chloride: 98 mmol/L — ABNORMAL LOW (ref 101–111)
Creatinine, Ser: 0.83 mg/dL (ref 0.61–1.24)
GFR calc non Af Amer: >60 mL/min (ref >60)
Glucose, Bld: 168 mg/dL — ABNORMAL HIGH (ref 65–99)
POTASSIUM: 3.8 mmol/L (ref 3.5–5.1)
SODIUM: 137 mmol/L (ref 135–145)
TOTAL PROTEIN: 7.1 g/dL (ref 6.5–8.1)
Total Bilirubin: 0.6 mg/dL (ref 0.3–1.2)

## 2016-04-28 LAB — PREPARE FRESH FROZEN PLASMA
UNIT DIVISION: 0
Unit division: 0

## 2016-04-28 LAB — I-STAT CHEM 8, ED
BUN: 7 mg/dL (ref 6–20)
CALCIUM ION: 0.94 mmol/L — AB (ref 1.15–1.40)
CHLORIDE: 99 mmol/L — AB (ref 101–111)
Creatinine, Ser: 1.3 mg/dL — ABNORMAL HIGH (ref 0.61–1.24)
GLUCOSE: 165 mg/dL — AB (ref 65–99)
HCT: 44 % (ref 39.0–52.0)
Hemoglobin: 15 g/dL (ref 13.0–17.0)
Potassium: 3.8 mmol/L (ref 3.5–5.1)
SODIUM: 137 mmol/L (ref 135–145)
TCO2: 23 mmol/L (ref 0–100)

## 2016-04-28 LAB — URINE MICROSCOPIC-ADD ON

## 2016-04-28 LAB — CBC
HEMATOCRIT: 40.1 % (ref 39.0–52.0)
Hemoglobin: 14.1 g/dL (ref 13.0–17.0)
MCH: 31.1 pg (ref 26.0–34.0)
MCHC: 35.2 g/dL (ref 30.0–36.0)
MCV: 88.5 fL (ref 78.0–100.0)
Platelets: 31 10*3/uL — ABNORMAL LOW (ref 150–400)
RBC: 4.53 MIL/uL (ref 4.22–5.81)
RDW: 13.8 % (ref 11.5–15.5)
WBC: 6.6 10*3/uL (ref 4.0–10.5)

## 2016-04-28 LAB — ETHANOL: ALCOHOL ETHYL (B): 405 mg/dL — AB (ref <5)

## 2016-04-28 LAB — ABO/RH: ABO/RH(D): O POS

## 2016-04-28 LAB — URINALYSIS, ROUTINE W REFLEX MICROSCOPIC
Bilirubin Urine: NEGATIVE
Glucose, UA: 500 mg/dL — AB
Ketones, ur: NEGATIVE mg/dL
LEUKOCYTES UA: NEGATIVE
Nitrite: NEGATIVE
PROTEIN: 100 mg/dL — AB
Specific Gravity, Urine: 1.023 (ref 1.005–1.030)
pH: 5.5 (ref 5.0–8.0)

## 2016-04-28 LAB — PROTIME-INR
INR: 0.94
PROTHROMBIN TIME: 12.6 s (ref 11.4–15.2)

## 2016-04-28 LAB — I-STAT CG4 LACTIC ACID, ED: LACTIC ACID, VENOUS: 5.47 mmol/L — AB (ref 0.5–1.9)

## 2016-04-28 LAB — MRSA PCR SCREENING: MRSA by PCR: NEGATIVE

## 2016-04-28 MED ORDER — INFLUENZA VAC SPLIT QUAD 0.5 ML IM SUSY
0.5000 mL | PREFILLED_SYRINGE | INTRAMUSCULAR | Status: AC
  Administered 2016-04-29: 0.5 mL via INTRAMUSCULAR
  Filled 2016-04-28: qty 0.5

## 2016-04-28 MED ORDER — THIAMINE HCL 100 MG/ML IJ SOLN
100.0000 mg | Freq: Every day | INTRAMUSCULAR | Status: DC
  Administered 2016-04-30: 100 mg via INTRAVENOUS
  Filled 2016-04-28: qty 2

## 2016-04-28 MED ORDER — ONDANSETRON HCL 4 MG PO TABS
4.0000 mg | ORAL_TABLET | Freq: Four times a day (QID) | ORAL | Status: DC | PRN
  Administered 2016-04-29 – 2016-05-03 (×2): 4 mg via ORAL
  Filled 2016-04-28 (×2): qty 1

## 2016-04-28 MED ORDER — LORAZEPAM 2 MG/ML IJ SOLN
0.0000 mg | Freq: Four times a day (QID) | INTRAMUSCULAR | Status: AC
  Administered 2016-04-29 (×3): 2 mg via INTRAVENOUS
  Administered 2016-04-30: 1 mg via INTRAVENOUS
  Administered 2016-04-30: 2 mg via INTRAVENOUS
  Administered 2016-04-30: 3 mg via INTRAVENOUS
  Administered 2016-04-30: 1 mg via INTRAVENOUS
  Filled 2016-04-28: qty 1
  Filled 2016-04-28: qty 2
  Filled 2016-04-28 (×3): qty 1
  Filled 2016-04-28: qty 2
  Filled 2016-04-28: qty 1

## 2016-04-28 MED ORDER — ACETAMINOPHEN 325 MG PO TABS
650.0000 mg | ORAL_TABLET | ORAL | Status: DC | PRN
  Administered 2016-04-30 – 2016-05-01 (×2): 650 mg via ORAL
  Filled 2016-04-28 (×2): qty 2

## 2016-04-28 MED ORDER — LORAZEPAM 1 MG PO TABS
1.0000 mg | ORAL_TABLET | Freq: Four times a day (QID) | ORAL | Status: AC | PRN
  Administered 2016-04-29 – 2016-05-01 (×7): 1 mg via ORAL
  Filled 2016-04-28 (×8): qty 1

## 2016-04-28 MED ORDER — LORAZEPAM 2 MG/ML IJ SOLN
0.0000 mg | Freq: Two times a day (BID) | INTRAMUSCULAR | Status: AC
  Administered 2016-04-30: 4 mg via INTRAVENOUS
  Administered 2016-05-01: 1 mg via INTRAVENOUS
  Administered 2016-05-01 – 2016-05-02 (×2): 2 mg via INTRAVENOUS
  Filled 2016-04-28: qty 2
  Filled 2016-04-28 (×4): qty 1

## 2016-04-28 MED ORDER — MORPHINE SULFATE (PF) 2 MG/ML IV SOLN
2.0000 mg | INTRAVENOUS | Status: DC | PRN
  Administered 2016-04-29: 2 mg via INTRAVENOUS
  Administered 2016-04-30 (×2): 4 mg via INTRAVENOUS
  Filled 2016-04-28: qty 1
  Filled 2016-04-28 (×2): qty 2

## 2016-04-28 MED ORDER — FOLIC ACID 1 MG PO TABS
1.0000 mg | ORAL_TABLET | Freq: Every day | ORAL | Status: DC
  Administered 2016-04-28 – 2016-05-03 (×6): 1 mg via ORAL
  Filled 2016-04-28 (×6): qty 1

## 2016-04-28 MED ORDER — OXYCODONE HCL 5 MG PO TABS
5.0000 mg | ORAL_TABLET | ORAL | Status: DC | PRN
  Filled 2016-04-28 (×2): qty 1

## 2016-04-28 MED ORDER — KCL IN DEXTROSE-NACL 20-5-0.45 MEQ/L-%-% IV SOLN
INTRAVENOUS | Status: DC
  Administered 2016-04-28 – 2016-04-29 (×2): via INTRAVENOUS
  Administered 2016-04-30: 100 mL/h via INTRAVENOUS
  Filled 2016-04-28 (×5): qty 1000

## 2016-04-28 MED ORDER — LORAZEPAM 2 MG/ML IJ SOLN
1.0000 mg | Freq: Four times a day (QID) | INTRAMUSCULAR | Status: AC | PRN
  Administered 2016-04-29 – 2016-04-30 (×2): 1 mg via INTRAVENOUS
  Filled 2016-04-28: qty 1

## 2016-04-28 MED ORDER — ADULT MULTIVITAMIN W/MINERALS CH
1.0000 | ORAL_TABLET | Freq: Every day | ORAL | Status: DC
Start: 2016-04-28 — End: 2016-05-03
  Administered 2016-04-28 – 2016-05-03 (×6): 1 via ORAL
  Filled 2016-04-28 (×6): qty 1

## 2016-04-28 MED ORDER — SODIUM CHLORIDE 0.9 % IV BOLUS (SEPSIS)
1000.0000 mL | Freq: Once | INTRAVENOUS | Status: AC
  Administered 2016-04-28: 1000 mL via INTRAVENOUS

## 2016-04-28 MED ORDER — ONDANSETRON HCL 4 MG/2ML IJ SOLN
4.0000 mg | Freq: Four times a day (QID) | INTRAMUSCULAR | Status: DC | PRN
  Administered 2016-04-29 – 2016-05-02 (×4): 4 mg via INTRAVENOUS
  Filled 2016-04-28 (×4): qty 2

## 2016-04-28 MED ORDER — OXYCODONE HCL 5 MG PO TABS
10.0000 mg | ORAL_TABLET | ORAL | Status: DC | PRN
  Administered 2016-04-28 – 2016-05-01 (×7): 10 mg via ORAL
  Filled 2016-04-28 (×6): qty 2

## 2016-04-28 MED ORDER — VITAMIN B-1 100 MG PO TABS
100.0000 mg | ORAL_TABLET | Freq: Every day | ORAL | Status: DC
  Administered 2016-04-28 – 2016-05-03 (×5): 100 mg via ORAL
  Filled 2016-04-28 (×5): qty 1

## 2016-04-28 MED ORDER — PNEUMOCOCCAL VAC POLYVALENT 25 MCG/0.5ML IJ INJ
0.5000 mL | INJECTION | INTRAMUSCULAR | Status: AC
  Administered 2016-04-29: 0.5 mL via INTRAMUSCULAR
  Filled 2016-04-28: qty 0.5
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
## 2016-04-28 NOTE — ED Notes (Signed)
Pt attempting to use urinal at this time.

## 2016-04-28 NOTE — Progress Notes (Signed)
Orthopedic Tech Progress Note Patient Details:  Jordan Davidson 06/26/1877 [id number]  Patient ID: Jordan Davidson, male   DOB: 06/26/1877, 45 y.o.   MRN: [id number]   Nikki DomCrawford, [name] 04/28/2016, 4:38 PM Made level 1 trauma visit

## 2016-04-28 NOTE — ED Notes (Signed)
Patient transported to CT 

## 2016-04-28 NOTE — ED Notes (Signed)
Pt returned from CT. Given verbal order to infuse another unit PRBC's if BP drops below 100 systolic.
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
## 2016-04-28 NOTE — ED Notes (Signed)
First unit PRBC's complete, HR down to 122, BP 124/88. Pt transporting to CT for scans with RN [place].

## 2016-04-28 NOTE — ED Notes (Signed)
Verbal orders given for 1 unit PRBC's.

## 2016-04-28 NOTE — ED Notes (Signed)
[employer] personnel took pants and boots

## 2016-04-28 NOTE — ED Notes (Signed)
1 unit PRBC's infusing via rapid infuser at this time.

## 2016-04-29 LAB — TYPE AND SCREEN
ABO/RH(D): O POS
Antibody Screen: NEGATIVE
UNIT DIVISION: 0
Unit division: 0

## 2016-04-29 LAB — CBC
HCT: 32.3 % — ABNORMAL LOW (ref 39.0–52.0)
HEMOGLOBIN: 11.2 g/dL — AB (ref 13.0–17.0)
MCH: 30.4 pg (ref 26.0–34.0)
MCHC: 34.7 g/dL (ref 30.0–36.0)
MCV: 87.5 fL (ref 78.0–100.0)
PLATELETS: 28 10*3/uL — AB (ref 150–400)
RBC: 3.69 MIL/uL — AB (ref 4.22–5.81)
RDW: 14.5 % (ref 11.5–15.5)
WBC: 6.2 10*3/uL (ref 4.0–10.5)

## 2016-04-29 LAB — LACTIC ACID, PLASMA: LACTIC ACID, VENOUS: 2.6 mmol/L — AB (ref 0.5–1.9)
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
## 2016-04-29 NOTE — Progress Notes (Signed)
CRITICAL VALUE ALERT  Critical value received: Platelets 28  Date of notification: 04/29/16  Time of notification: 0900  Critical value read back:yes  Nurse who received alert: Arman Bogusorothy Irelynd Zumstein RN   MD notified (1st page):  Dr Derrell Lollingamirez informed during rounding.  Time of first page:  1035
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
## 2016-04-29 NOTE — Progress Notes (Addendum)
CRITICAL VALUE ALERT  Critical value received:  Lactic Acid 2.6  Date of notification:  04/29/2016  Time of notification:  0644  Critical value read back:Yes.    Nurse who received alert:  Ricky StabsKelly Chelcie Estorga  MD notified (1st page):  Donell BeersByerly   Time of first page:  [phone]  MD notified (2nd page):  Time of second page:  Responding MD:     Time MD responded:

## 2016-04-30 MED ORDER — NICOTINE 21 MG/24HR TD PT24
21.0000 mg | MEDICATED_PATCH | Freq: Every day | TRANSDERMAL | Status: DC
  Administered 2016-04-30 – 2016-05-03 (×4): 21 mg via TRANSDERMAL
  Filled 2016-04-30 (×4): qty 1

## 2016-04-30 NOTE — Progress Notes (Signed)
Patient states wants staff to go to store and buy him clothes as he wants to leave.  States he will not be here another day.  I stated to him that we are not authorized to do this and asked if he were aware of his physical condition and why he was in the hospital.  He is belligerent and states he wants to smoke and is insistent on leaving. Speech is slurred and he refuses to answer neuro assessment questions, though he does state his vision is more blurred than admission.   Attempting to call PA / MD on call.
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
## 2016-05-01 ENCOUNTER — Encounter (HOSPITAL_COMMUNITY): Payer: Self-pay | Admitting: Oncology

## 2016-05-01 DIAGNOSIS — S0101XA Laceration without foreign body of scalp, initial encounter: Secondary | ICD-10-CM | POA: Diagnosis present

## 2016-05-01 DIAGNOSIS — F1010 Alcohol abuse, uncomplicated: Secondary | ICD-10-CM | POA: Diagnosis present

## 2016-05-01 DIAGNOSIS — D696 Thrombocytopenia, unspecified: Secondary | ICD-10-CM | POA: Diagnosis present

## 2016-05-01 DIAGNOSIS — D62 Acute posthemorrhagic anemia: Secondary | ICD-10-CM | POA: Diagnosis not present

## 2016-05-01 LAB — COMPREHENSIVE METABOLIC PANEL
ALT: 38 U/L (ref 17–63)
AST: 74 U/L — AB (ref 15–41)
Albumin: 3.8 g/dL (ref 3.5–5.0)
Alkaline Phosphatase: 66 U/L (ref 38–126)
Anion gap: 9 (ref 5–15)
BILIRUBIN TOTAL: 0.7 mg/dL (ref 0.3–1.2)
CALCIUM: 9.1 mg/dL (ref 8.9–10.3)
CHLORIDE: 99 mmol/L — AB (ref 101–111)
CO2: 27 mmol/L (ref 22–32)
CREATININE: 0.7 mg/dL (ref 0.61–1.24)
Glucose, Bld: 106 mg/dL — ABNORMAL HIGH (ref 65–99)
Potassium: 3.9 mmol/L (ref 3.5–5.1)
Sodium: 135 mmol/L (ref 135–145)
TOTAL PROTEIN: 6.3 g/dL — AB (ref 6.5–8.1)

## 2016-05-01 LAB — CBC
HCT: 29.8 % — ABNORMAL LOW (ref 39.0–52.0)
Hemoglobin: 10.2 g/dL — ABNORMAL LOW (ref 13.0–17.0)
MCH: 30.4 pg (ref 26.0–34.0)
MCHC: 34.2 g/dL (ref 30.0–36.0)
MCV: 89 fL (ref 78.0–100.0)
PLATELETS: 30 10*3/uL — AB (ref 150–400)
RBC: 3.35 MIL/uL — AB (ref 4.22–5.81)
RDW: 14.2 % (ref 11.5–15.5)
WBC: 4.3 10*3/uL (ref 4.0–10.5)

## 2016-05-01 LAB — HIV ANTIBODY (ROUTINE TESTING W REFLEX): HIV SCREEN 4TH GENERATION: NONREACTIVE

## 2016-05-01 LAB — SAVE SMEAR

## 2016-05-01 MED ORDER — POLYETHYLENE GLYCOL 3350 17 G PO PACK
17.0000 g | PACK | Freq: Every day | ORAL | Status: DC
  Administered 2016-05-01 – 2016-05-03 (×3): 17 g via ORAL
  Filled 2016-05-01 (×3): qty 1

## 2016-05-01 MED ORDER — DEXAMETHASONE SODIUM PHOSPHATE 4 MG/ML IJ SOLN
10.0000 mg | Freq: Every day | INTRAMUSCULAR | Status: AC
  Administered 2016-05-02 – 2016-05-03 (×2): 10 mg via INTRAVENOUS
  Filled 2016-05-01 (×2): qty 3

## 2016-05-01 MED ORDER — DOCUSATE SODIUM 100 MG PO CAPS
100.0000 mg | ORAL_CAPSULE | Freq: Two times a day (BID) | ORAL | Status: DC
  Administered 2016-05-01 – 2016-05-03 (×5): 100 mg via ORAL
  Filled 2016-05-01 (×5): qty 1

## 2016-05-01 MED ORDER — HYDROMORPHONE HCL 1 MG/ML IJ SOLN: 0.5000 mg | INTRAMUSCULAR | Status: DC | PRN

## 2016-05-01 MED ORDER — TRAMADOL HCL 50 MG PO TABS
100.0000 mg | ORAL_TABLET | Freq: Four times a day (QID) | ORAL | Status: DC
  Administered 2016-05-01 – 2016-05-03 (×10): 100 mg via ORAL
  Filled 2016-05-01 (×10): qty 2

## 2016-05-01 MED ORDER — BACITRACIN ZINC 500 UNIT/GM EX OINT
TOPICAL_OINTMENT | Freq: Two times a day (BID) | CUTANEOUS | Status: DC
  Administered 2016-05-01 – 2016-05-03 (×5): 31.5556 via TOPICAL
  Filled 2016-05-01: qty 28.35

## 2016-05-01 MED ORDER — OXYCODONE HCL 5 MG PO TABS
10.0000 mg | ORAL_TABLET | ORAL | Status: DC | PRN
  Administered 2016-05-01 – 2016-05-02 (×3): 15 mg via ORAL
  Filled 2016-05-01 (×3): qty 3

## 2016-05-01 MED ORDER — IMMUNE GLOBULIN (HUMAN) 20 GM/200ML IV SOLN
1.0000 g/kg | INTRAVENOUS | Status: AC
  Administered 2016-05-02 – 2016-05-03 (×2): 80 g via INTRAVENOUS
  Filled 2016-05-01 (×2): qty 800
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
## 2016-05-01 NOTE — Progress Notes (Signed)
   05/01/16 1605  Vitals  BP 118/87  BP Location Left Arm  BP Method Automatic  Patient Position (if appropriate) Lying  Pulse Rate 97  Pulse Rate Source Dinamap  blood pressure rechecked and noted to be within therapeutic limits
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
## 2016-05-02 ENCOUNTER — Telehealth: Payer: Self-pay | Admitting: Oncology

## 2016-05-02 LAB — PATHOLOGIST SMEAR REVIEW

## 2016-05-02 LAB — HEPATITIS C ANTIBODY
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
## 2016-05-02 NOTE — Progress Notes (Signed)
   05/02/16 1124  Vitals  Temp 98.2 F (36.8 C)  Temp Source Oral  BP (!) 142/110  BP Location Left Arm  BP Method Automatic  Patient Position (if appropriate) Sitting  Pulse Rate 100  Pulse Rate Source Dinamap  Oxygen Therapy  SpO2 97 %  O2 Device Room Air  diastolic blood pressure elevated at start of ivig infusion
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
## 2016-05-02 NOTE — Progress Notes (Signed)
OT Cancellation Note  Patient Details Name: Jordan Davidson Maurice XXXSmith MRN: [id number] DOB: 07-Mar-1971   Cancelled Treatment:    Reason Eval/Treat Not Completed: Patient declined, no reason specified. Will try again later today as able  [name], [name] [name] 05/02/2016, 12:24 PM
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
## 2016-05-02 NOTE — Progress Notes (Signed)
   05/02/16 1351  Vitals  Temp 97.7 F (36.5 C)  Temp Source Oral  BP 120/83  BP Location Right Arm  BP Method Automatic  Patient Position (if appropriate) Lying  Pulse Rate 94  Pulse Rate Source Dinamap  Resp 16  Oxygen Therapy  SpO2 99 %  O2 Device Room Air  IVIG infusion completed with no adverse reactions noted ,vs charted above

## 2016-05-02 NOTE — Progress Notes (Signed)
Left [facility] message at [facility] voice mail but will continue ivig infusion as pt already runs high blood pressure at baseline

## 2016-05-02 NOTE — Telephone Encounter (Signed)
Tc to schedule hosp fu w/Magrinat. No answer. Unable to leave a vm.

## 2016-05-02 NOTE — Progress Notes (Signed)
Several attempts to contact [facility] in order to inform of high blood pressures at start of ivig, not successful with getting through to his office, will keep trying

## 2016-05-02 NOTE — Progress Notes (Signed)
PT Cancellation Note  Patient Details Name: Jordan Davidson MRN: [id number] DOB: [birth year]   Cancelled Treatment:    Reason Eval/Treat Not Completed: Other (comment) (Pt refused as he just had 3 oxycodones and no sleep.  )Will return in pm as able.  Thanks.   Tawni MillersWhite, Gregery Walberg F 05/02/2016, 10:09 AM Eber Jonesawn Atanacio Melnyk,PT Acute Rehabilitation [phone] (pager)
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
## 2016-05-03 ENCOUNTER — Encounter (HOSPITAL_COMMUNITY): Payer: Self-pay | Admitting: *Deleted

## 2016-05-03 MED ORDER — TRAMADOL HCL 50 MG PO TABS: 50.0000 mg | ORAL_TABLET | Freq: Four times a day (QID) | ORAL | 0 refills | Status: DC

## 2016-05-03 MED ORDER — NICOTINE 21 MG/24HR TD PT24: 21.0000 mg | MEDICATED_PATCH | Freq: Every day | TRANSDERMAL | 0 refills | Status: DC

## 2016-05-03 MED ORDER — BACITRACIN ZINC 500 UNIT/GM EX OINT: TOPICAL_OINTMENT | CUTANEOUS | 0 refills | Status: DC

## 2016-05-03 MED ORDER — ADULT MULTIVITAMIN W/MINERALS CH: ORAL_TABLET | ORAL | Status: DC
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
## 2016-05-03 NOTE — Progress Notes (Signed)
Physical Therapy Discharge Patient Details Name: Jordan Davidson MRN: [id number] DOB: 08-16-1970 Today's Date: 05/03/2016 Time: 1224-8250 PT Time Calculation (min) (ACUTE ONLY): 14 min  Patient discharged from PT services secondary to goals met and no further PT needs identified.  Please see latest therapy progress note for current level of functioning and progress toward goals.    Progress and discharge plan discussed with patient and/or caregiver: Patient/Caregiver agrees with plan  GP     Ok Edwards, SPT [phone]   05/03/2016, 12:30 PM
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
## 2016-05-03 NOTE — Discharge Instructions (Signed)
Concussion, Adult  A concussion, or closed-head injury, is a brain injury caused by a direct blow to the head or by a quick and sudden movement (jolt) of the head or neck. Concussions are usually not life-threatening. Even so, the effects of a concussion can be serious. If you have had a concussion before, you are more likely to experience concussion-like symptoms after a direct blow to the head.   CAUSES  · Direct blow to the head, such as from running into another player during a soccer game, being hit in a fight, or hitting your head on a hard surface.  · A jolt of the head or neck that causes the brain to move back and forth inside the skull, such as in a car crash.  SIGNS AND SYMPTOMS  The signs of a concussion can be hard to notice. Early on, they may be missed by you, family members, and health care providers. You may look fine but act or feel differently.  Symptoms are usually temporary, but they may last for days, weeks, or even longer. Some symptoms may appear right away while others may not show up for hours or days. Every head injury is different. Symptoms include:  · Mild to moderate headaches that will not go away.  · A feeling of pressure inside your head.  · Having more trouble than usual:    Learning or remembering things you have heard.    Answering questions.    Paying attention or concentrating.    Organizing daily tasks.    Making decisions and solving problems.  · Slowness in thinking, acting or reacting, speaking, or reading.  · Getting lost or being easily confused.  · Feeling tired all the time or lacking energy (fatigued).  · Feeling drowsy.  · Sleep disturbances.    Sleeping more than usual.    Sleeping less than usual.    Trouble falling asleep.    Trouble sleeping (insomnia).  · Loss of balance or feeling lightheaded or dizzy.  · Nausea or vomiting.  · Numbness or tingling.  · Increased sensitivity to:    Sounds.    Lights.    Distractions.  · Vision problems or eyes that tire  easily.  · Diminished sense of taste or smell.  · Ringing in the ears.  · Mood changes such as feeling sad or anxious.  · Becoming easily irritated or angry for little or no reason.  · Lack of motivation.  · Seeing or hearing things other people do not see or hear (hallucinations).  DIAGNOSIS  Your health care provider can usually diagnose a concussion based on a description of your injury and symptoms. He or she will ask whether you passed out (lost consciousness) and whether you are having trouble remembering events that happened right before and during your injury.  Your evaluation might include:  · A brain scan to look for signs of injury to the brain. Even if the test shows no injury, you may still have a concussion.  · Blood tests to be sure other problems are not present.  TREATMENT  · Concussions are usually treated in an emergency department, in urgent care, or at a clinic. You may need to stay in the hospital overnight for further treatment.  · Tell your health care provider if you are taking any medicines, including prescription medicines, over-the-counter medicines, and natural remedies. Some medicines, such as blood thinners (anticoagulants) and aspirin, may increase the chance of complications. Also tell your health care   provider whether you have had alcohol or are taking illegal drugs. This information may affect treatment.  · Your health care provider will send you home with important instructions to follow.  · How fast you will recover from a concussion depends on many factors. These factors include how severe your concussion is, what part of your brain was injured, your age, and how healthy you were before the concussion.  · Most people with mild injuries recover fully. Recovery can take time. In general, recovery is slower in older persons. Also, persons who have had a concussion in the past or have other medical problems may find that it takes longer to recover from their current injury.  HOME  CARE INSTRUCTIONS  General Instructions  · Carefully follow the directions your health care provider gave you.  · Only take over-the-counter or prescription medicines for pain, discomfort, or fever as directed by your health care provider.  · Take only those medicines that your health care provider has approved.  · Do not drink alcohol until your health care provider says you are well enough to do so. Alcohol and certain other drugs may slow your recovery and can put you at risk of further injury.  · If it is harder than usual to remember things, write them down.  · If you are easily distracted, try to do one thing at a time. For example, do not try to watch TV while fixing dinner.  · Talk with family members or close friends when making important decisions.  · Keep all follow-up appointments. Repeated evaluation of your symptoms is recommended for your recovery.  · Watch your symptoms and tell others to do the same. Complications sometimes occur after a concussion. Older adults with a brain injury may have a higher risk of serious complications, such as a blood clot on the brain.  · Tell your teachers, school nurse, school counselor, coach, athletic trainer, or work manager about your injury, symptoms, and restrictions. Tell them about what you can or cannot do. They should watch for:    Increased problems with attention or concentration.    Increased difficulty remembering or learning new information.    Increased time needed to complete tasks or assignments.    Increased irritability or decreased ability to cope with stress.    Increased symptoms.  · Rest. Rest helps the brain to heal. Make sure you:    Get plenty of sleep at night. Avoid staying up late at night.    Keep the same bedtime hours on weekends and weekdays.    Rest during the day. Take daytime naps or rest breaks when you feel tired.  · Limit activities that require a lot of thought or concentration. These include:    Doing homework or job-related  work.    Watching TV.    Working on the computer.  · Avoid any situation where there is potential for another head injury (football, hockey, soccer, basketball, martial arts, downhill snow sports and horseback riding). Your condition will get worse every time you experience a concussion. You should avoid these activities until you are evaluated by the appropriate follow-up health care providers.  Returning To Your Regular Activities  You will need to return to your normal activities slowly, not all at once. You must give your body and brain enough time for recovery.  · Do not return to sports or other athletic activities until your health care provider tells you it is safe to do so.  · Ask   your health care provider when you can drive, ride a bicycle, or operate heavy machinery. Your ability to react may be slower after a brain injury. Never do these activities if you are dizzy.  · Ask your health care provider about when you can return to work or school.  Preventing Another Concussion  It is very important to avoid another brain injury, especially before you have recovered. In rare cases, another injury can lead to permanent brain damage, brain swelling, or death. The risk of this is greatest during the first 7-10 days after a head injury. Avoid injuries by:  · Wearing a seat belt when riding in a car.  · Drinking alcohol only in moderation.  · Wearing a helmet when biking, skiing, skateboarding, skating, or doing similar activities.  · Avoiding activities that could lead to a second concussion, such as contact or recreational sports, until your health care provider says it is okay.  · Taking safety measures in your home.    Remove clutter and tripping hazards from floors and stairways.    Use grab bars in bathrooms and handrails by stairs.    Place non-slip mats on floors and in bathtubs.    Improve lighting in dim areas.  SEEK MEDICAL CARE IF:  · You have increased problems paying attention or  concentrating.  · You have increased difficulty remembering or learning new information.  · You need more time to complete tasks or assignments than before.  · You have increased irritability or decreased ability to cope with stress.  · You have more symptoms than before.  Seek medical care if you have any of the following symptoms for more than 2 weeks after your injury:  · Lasting (chronic) headaches.  · Dizziness or balance problems.  · Nausea.  · Vision problems.  · Increased sensitivity to noise or light.  · Depression or mood swings.  · Anxiety or irritability.  · Memory problems.  · Difficulty concentrating or paying attention.  · Sleep problems.  · Feeling tired all the time.  SEEK IMMEDIATE MEDICAL CARE IF:  · You have severe or worsening headaches. These may be a sign of a blood clot in the brain.  · You have weakness (even if only in one hand, leg, or part of the face).  · You have numbness.  · You have decreased coordination.  · You vomit repeatedly.  · You have increased sleepiness.  · One pupil is larger than the other.  · You have convulsions.  · You have slurred speech.  · You have increased confusion. This may be a sign of a blood clot in the brain.  · You have increased restlessness, agitation, or irritability.  · You are unable to recognize people or places.  · You have neck pain.  · It is difficult to wake you up.  · You have unusual behavior changes.  · You lose consciousness.  MAKE SURE YOU:  · Understand these instructions.  · Will watch your condition.  · Will get help right away if you are not doing well or get worse.     This information is not intended to replace advice given to you by your health care provider. Make sure you discuss any questions you have with your health care provider.     Document Released: 09/02/2003 Document Revised: 07/03/2014 Document Reviewed: 01/02/2013  Elsevier Interactive Patient Education ©2016 Elsevier Inc.

## 2016-05-03 NOTE — Care Management Note (Signed)
Case Management Note  Patient Details  Name: Jordan Davidson MRN: [id number] Date of Birth: 18-Mar-1971  Subjective/Objective: Pt medically stable for discharge today.  Pt is uninsured, homeless.                     Action/Plan: Pt eligible for medication assistance [facility] with explanation of program benefits.  CSW notified for bus pass for pt.    Expected Discharge Date:    05/03/2016              Expected Discharge Plan:  Home/Self Care  In-House Referral:  Clinical Social Work  Discharge planning Services  CM Consult, Medication Assistance, MATCH Program  Post Acute Care Choice:    Choice offered to:     DME Arranged:    DME Agency:     HH Arranged:    HH Agency:     Status of Service:  Completed, signed off  If discussed at [employer] Meetings, dates discussed:    Additional Comments:  Quintella BatonJulie W. Hermena Swint, RN, BSN  Trauma/Neuro ICU Case Manager [phone]
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
## 2016-05-03 NOTE — Clinical Social Work Note (Signed)
Clinical Social Work Assessment  Patient Details  Name: Jordan Davidson MRN: [id number] Date of Birth: [birth year]  Date of referral:  05/03/16               Reason for consult:  Substance Use/ETOH Abuse, Transportation, Housing Concerns/Homelessness                Permission sought to share information with:    Permission granted to share information::  No  Name::        Agency::     Relationship::     Contact Information:     Housing/Transportation Living arrangements for the past 2 months:  Homeless Source of Information:  Patient, Medical Team Patient Interpreter Needed:  None Criminal Activity/Legal Involvement Pertinent to Current Situation/Hospitalization:  No - Comment as needed Significant Relationships:  Parents Lives with:  Self Do you feel safe going back to the place where you live?  Yes Need for family participation in patient care:  Yes (Comment)  Care giving concerns:  Patient is homeless.  Social Worker assessment / plan:  CSW met with patient. No supports at bedside. CSW introduced role and inquired about resources patient may need. Per RN, patient is homeless and needs clothing and substance abuse resources. CSW provided resources for residential and outpatient substance abuse resources, shelter resources, community resources within [facility], and free meal and food pantry resources. CSW provided patient with a shirt, pants, underwear, and tennis shoes. CSW explained that men's clothing is limited in the clothing closet. Patient expressed understanding. Patient stated that he has been sleeping in the back of a [place] [employer]. Per RNCM, patient also in need of a bus pass. CSW provided. No further concerns. CSW signing off as social work intervention is no longer needed and patient is discharging today.  Employment status:  Unemployed Forensic scientist:  Self Pay (Medicaid Pending) PT Recommendations:  No Follow  Up Information / Referral to community resources:  Shelter, Residential Substance Abuse Treatment Options, Outpatient Substance Abuse Treatment Options, Other (Comment Required) (Food resources, [employer], Bus passes) Clothing  Patient/Family's Response to care:  Patient agreeable to receiving resources. Patient does not appear to have a support system. Patient appreciated social work intervention.  Patient/Family's Understanding of and Emotional Response to Diagnosis, Current Treatment, and Prognosis:  Patient understands reason for social work consult. Patient appears happy with hospital care.  Emotional Assessment Appearance:  Appears stated age Attitude/Demeanor/Rapport:  Lethargic Affect (typically observed):  Accepting, Flat, Quiet Orientation:  Oriented to Self, Oriented to Place, Oriented to  Time, Oriented to Situation Alcohol / Substance use:  Alcohol Use Psych involvement (Current and /or in the community):  No (Comment)  Discharge Needs  Concerns to be addressed:  Basic Needs, Financial / Insurance Concerns, Homelessness, Substance Abuse Concerns Readmission within the last 30 days:  No Current discharge risk:  Inadequate Financial Supports, Homeless, Lack of support system, Substance Abuse Barriers to Discharge:  Active Substance Use, Homeless with medical needs, Inadequate or no insurance   Candie Chroman, LCSW 05/03/2016, 3:41 PM

## 2016-05-03 NOTE — Progress Notes (Signed)
Jordan Davidson   DOB:10-14-1970   NF#:[id number]#:[id number]   QIO#:[id number]#:[id number]  Subjective: Jordan Davidson tolerated the IVIG infusion yesterday w/o event. Still has an 8/10 headache and some nausea/ vomiting; feels a bit less dizzy when OOB. No family in room  Objective: middle aged [place] examined in bed Vitals:   05/03/16 0200 05/03/16 0637  BP: 132/84 131/82  Pulse: 86 86  Resp: 16 16  Temp: 98.3 F (36.8 C) 98.7 F (37.1 C)    Body mass index is 23.19 kg/m.  Intake/Output Summary (Last 24 hours) at 05/03/16 0739 Last data filed at 05/03/16 0647  Gross per 24 hour  Intake             2540 ml  Output              600 ml  Net             1940 ml  .  No cervical or supraclavicular adenopathy  Lungs no rales or wheezes--auscultated anterolaterally  Heart regular rate and rhythm  Abdomen soft, +BS  Neuro nonfocal   CBG (last 3)  No results for input(s): GLUCAP in the last 72 hours.   Labs:  Lab Results  Component Value Date   WBC 4.3 05/01/2016   HGB 10.2 (L) 05/01/2016   HCT 29.8 (L) 05/01/2016   MCV 89.0 05/01/2016   PLT 30 (L) 05/01/2016    @LASTCHEMISTRY @  Urine Studies No results for input(s): UHGB, CRYS in the last 72 hours.  Invalid input(s): UACOL, UAPR, USPG, UPH, UTP, UGL, UKET, UBIL, UNIT, UROB, ULEU, UEPI, UWBC, URBC, UBAC, CAST, UCOM, BILUA  Basic Metabolic Panel:  Recent Labs Lab 04/28/16 1640 04/28/16 1656 05/01/16 0133  NA 137 137 135  K 3.8 3.8 3.9  CL 98* 99* 99*  CO2 22  --  27  GLUCOSE 168* 165* 106*  BUN 7 7 <5*  CREATININE 0.83 1.30* 0.70  CALCIUM 8.8*  --  9.1   GFR Estimated Creatinine Clearance: 125.3 mL/min (by C-G formula based on SCr of 0.7 mg/dL). Liver Function Tests:  Recent Labs Lab 04/28/16 1640 05/01/16 0133  AST 129* 74*  ALT 58 38  ALKPHOS 75 66  BILITOT 0.6 0.7  PROT 7.1 6.3*  ALBUMIN 4.4 3.8   No results for input(s): LIPASE, AMYLASE in the last 168 hours. No results for input(s): AMMONIA in the last 168  hours. Coagulation profile  Recent Labs Lab 04/28/16 1640  INR 0.94    CBC:  Recent Labs Lab 04/28/16 1640 04/28/16 1656 04/29/16 0345 05/01/16 0133  WBC 6.6  --  6.2 4.3  HGB 14.1 15.0 11.2* 10.2*  HCT 40.1 44.0 32.3* 29.8*  MCV 88.5  --  87.5 89.0  PLT 31*  --  28* 30*   Cardiac Enzymes: No results for input(s): CKTOTAL, CKMB, CKMBINDEX, TROPONINI in the last 168 hours. BNP: Invalid input(s): POCBNP CBG: No results for input(s): GLUCAP in the last 168 hours. D-Dimer No results for input(s): DDIMER in the last 72 hours. Hgb A1c No results for input(s): HGBA1C in the last 72 hours. Lipid Profile No results for input(s): CHOL, HDL, LDLCALC, TRIG, CHOLHDL, LDLDIRECT in the last 72 hours. Thyroid function studies No results for input(s): TSH, T4TOTAL, T3FREE, THYROIDAB in the last 72 hours.  Invalid input(s): FREET3 Anemia work up No results for input(s): VITAMINB12, FOLATE, FERRITIN, TIBC, IRON, RETICCTPCT in the last 72 hours. Microbiology Recent Results (from the past 240 hour(s))  MRSA PCR Screening  Status: None   Collection Time: 04/28/16  8:49 PM  Result Value Ref Range Status   MRSA by PCR NEGATIVE NEGATIVE Final    Comment:        The GeneXpert MRSA Assay (FDA approved for NASAL specimens only), is one component of a comprehensive MRSA colonization surveillance program. It is not intended to diagnose MRSA infection nor to guide or monitor treatment for MRSA infections.       Studies:  No results found.  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 He understands this is a "mistake" his immune system is making and that it can be corrected.    He did well with infusion yesterday and will receive second dose today.  I am concerned that he has no place to go and no transportation. He would benefit from SW consult to explore options with patient, connect him to available community resources, referral to alcoholism services, provide him with bus passes, etc  He will see me again NOV 22 at 12:30 PM-- he is aware of appointment.  Please let me know if I can be of further help at this time   Jordan Davidson,Ngozi Alvidrez C, MD 05/03/2016  7:39 AM Medical Oncology and Hematology [facility] [address] [place], [place] [address] Tel. [phone]    Fax. [phone]
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
## 2016-05-10 ENCOUNTER — Emergency Department (HOSPITAL_COMMUNITY)
Admission: EM | Admit: 2016-05-10 | Discharge: 2016-05-10 | Disposition: A | Discharge status: Home / Self Care | Payer: Self-pay | Attending: Emergency Medicine | Admitting: Emergency Medicine

## 2016-05-10 ENCOUNTER — Encounter (HOSPITAL_COMMUNITY): Payer: Self-pay | Admitting: Emergency Medicine

## 2016-05-10 DIAGNOSIS — Z4802 Encounter for removal of sutures: Secondary | ICD-10-CM | POA: Insufficient documentation

## 2016-05-10 DIAGNOSIS — F10120 Alcohol abuse with intoxication, uncomplicated: Secondary | ICD-10-CM | POA: Insufficient documentation

## 2016-05-10 DIAGNOSIS — Z79899 Other long term (current) drug therapy: Secondary | ICD-10-CM | POA: Insufficient documentation

## 2016-05-10 DIAGNOSIS — F10920 Alcohol use, unspecified with intoxication, uncomplicated: Secondary | ICD-10-CM

## 2016-05-10 DIAGNOSIS — F17210 Nicotine dependence, cigarettes, uncomplicated: Secondary | ICD-10-CM | POA: Insufficient documentation

## 2016-05-10 LAB — CBG MONITORING, ED: Glucose-Capillary: 96 mg/dL (ref 65–99)

## 2016-05-10 NOTE — Discharge Instructions (Signed)
Resources for alcohol detox have been offered. Follow up with your PCP as needed. Return to the ED as needed.

## 2016-05-10 NOTE — ED Notes (Signed)
Pt ambulated well to the bathroom without assistance, gait and balance steady.
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
## 2016-05-10 NOTE — ED Provider Notes (Signed)
[facility]-EMERGENCY DEPT Provider Note   CSN: [id number] Arrival date & time: 05/10/16  0024  By signing my name below, I, Soijett Blue, attest that this documentation has been prepared under the direction and in the presence of Kialee Kham, PA-C Electronically Signed: Soijett Blue, ED Scribe. 05/10/16. 1:36 AM.   History   Chief Complaint Chief Complaint  Patient presents with  . Alcohol Intoxication    HPI Jordan Davidson is a 45 y.o. male with a PMHx of alcohol abuse, alcoholism, bipolar 1 disorder, depression, who presents to the Emergency Department brought in by EMS complaining of alcohol intoxication onset PTA. Pt notes that he consumed 12-13 beers today and that is typical for him. Per EMS report, pt was found lying on a gas station bathroom floor. Patient states he went to sleep in the bathroom because it was warm in there and he wanted to get out of the cold. Patient denies fever/chills, falls/trauma, chest pain, shortness of breath, or any other complaints.     The history is provided by the patient. No language interpreter was used.    Past Medical History:  Diagnosis Date  . Alcohol abuse   . Alcoholism (HCC)   . Bipolar 1 disorder, depressed (HCC)   . Depression   . Thrombocytopenia [facility](HCC)     Patient Active Problem List   Diagnosis Date Noted  . Scalp laceration 05/01/2016  . Acute blood loss anemia 05/01/2016  . Thrombocytopenia (HCC) 05/01/2016  . Alcohol abuse 05/01/2016  . Blunt head trauma 04/28/2016  . Major depressive disorder, recurrent episode, moderate (HCC) 12/13/2015  . 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 Bipolar I disorder, most recent episode (or current) unspecified 02/14/2015  . Alcohol use disorder, severe, dependence (HCC) 02/13/2015    Past Surgical History:  Procedure Laterality Date  . NO PAST SURGERIES         Home Medications    Prior to Admission medications   Medication Sig Start Date End Date Taking?  Authorizing Provider  bacitracin ointment Apply to scalp 2 times daily for 1 week. 05/03/16   Edson SnowballBrooke A Miller, PA-C  cephALEXin (KEFLEX) 500 MG capsule Take 1 capsule (500 mg total) by mouth 4 (four) times daily. 03/25/16   Nicole Pisciotta, PA-C  Multiple Vitamin (MULTIVITAMIN WITH MINERALS) TABS tablet Recommend taking a multivitamin once daily. You can get this over the counter. 05/03/16   Edson SnowballBrooke A Miller, PA-C  nicotine (NICODERM CQ - DOSED IN MG/24 HOURS) 21 mg/24hr patch Place 1 patch (21 mg total) onto the skin daily. 05/04/16   Edson SnowballBrooke A Miller, PA-C  traMADol (ULTRAM) 50 MG tablet Take 1-2 tablets (50-100 mg total) by mouth every 6 (six) hours. 05/03/16   Edson SnowballBrooke A Miller, PA-C    Family History Family History  Problem Relation Age of Onset  . Hypertension Mother   . Cancer Father   . Heart failure Father   . Alcoholism Father     Social History Social History  Substance Use Topics  . Smoking status: Current Every Day Smoker    Types: Cigarettes  . Smokeless tobacco: Never Used  . Alcohol use Yes     Comment: daily - "whatever I can get" - 1/5 plus daily     Allergies   Patient has no known allergies.   Review of Systems Review of Systems  Constitutional: Negative for chills and fever.       Intoxication  Respiratory: Negative for shortness of breath.   Cardiovascular: Negative for  chest pain.  Gastrointestinal: Negative for abdominal pain, blood in stool, diarrhea, nausea and vomiting.  Genitourinary: Negative for dysuria.  Skin: Positive for wound (repaired, healing laceration to top of scalp). Negative for color change.  Neurological: Negative for dizziness, syncope, weakness, light-headedness, numbness and headaches.  All other systems reviewed and are negative.    Physical Exam Updated Vital Signs BP 110/72 (BP Location: Left Arm)   Pulse 96   Resp 17   SpO2 94%   Physical Exam  Constitutional: He appears well-developed and well-nourished. No distress.    HENT:  Head: Normocephalic.  Mouth/Throat: Oropharynx is clear and moist.  Dried, old-appearing blood matted to the patient's hair. This was cleaned off and a well-healed scalp laceration is noted with 8 staples in place. No signs of infection or dehiscence. No active hemorrhage or discharge.  Eyes: Conjunctivae and EOM are normal. Pupils are equal, round, and reactive to light.  Neck: Normal range of motion. Neck supple.  Cardiovascular: Normal rate, regular rhythm, normal heart sounds and intact distal pulses.   Pulmonary/Chest: Effort normal and breath sounds normal. No respiratory distress.  Abdominal: Soft. There is no tenderness. There is no guarding.  Musculoskeletal: He exhibits no edema.  Normal motor function intact in all extremities and spine. No midline spinal tenderness.   Lymphadenopathy:    He has no cervical adenopathy.  Neurological: He is alert.  No sensory deficits. Strength 5/5 in all extremities. No gait disturbance. Coordination intact. Cranial nerves III-XII grossly intact. No facial droop.   Skin: Skin is warm and dry. He is not diaphoretic.  Psychiatric: He has a normal mood and affect. His behavior is normal.  Nursing note and vitals reviewed.    ED Treatments / Results  DIAGNOSTIC STUDIES:     COORDINATION OF CARE: 1:34 AM Discussed treatment plan with pt at bedside and pt agreed to plan.   Labs (all labs ordered are listed, but only abnormal results are displayed) Labs Reviewed  CBG MONITORING, ED    EKG  EKG Interpretation None       Radiology No results found.  Procedures .Suture Removal Date/Time: 05/10/2016 1:29 AM Performed by: Anselm PancoastJOY, Saretta Dahlem C Authorized by: Anselm PancoastJOY, Ahmiyah Coil C   Consent:    Consent obtained:  Verbal   Consent given by:  Patient   Risks discussed:  Pain Location:    Location:  Head/neck   Head/neck location:  Scalp (crown) Procedure details:    Wound appearance:  No signs of infection   Number of staples removed:   8 Post-procedure details:    Post-removal:  No dressing applied   Patient tolerance of procedure:  Tolerated well, no immediate complications     (including critical care time)  Medications Ordered in ED Medications - No data to display   Initial Impression / Assessment and Plan / ED Course  I have reviewed the triage vital signs and the nursing notes.  Pertinent labs & imaging results that were available during my care of the patient were reviewed by me and considered in my medical decision making (see chart for details).  Clinical Course     Patient presents with alcohol intoxication. His previous head wound that was repaired on November 4 is noted above. The staples were removed without difficulty. No new trauma noted. No other abnormalities noted on exam. Patient was allowed to sleep in the ED until clinically sober. He was reassessed on multiple occasions with minor improvement each time. Upon final assessment, patient found to  be clinically sober. Ambulated steadily without assistance. States he is ready for discharge. Return precautions discussed.  Vitals:   05/10/16 0203 05/10/16 0426  BP: 112/75 110/72  Pulse: 89 96  Resp: 17 17  SpO2: 91% 94%     Final Clinical Impressions(s) / ED Diagnoses   Final diagnoses:  Alcoholic intoxication without complication (HCC)    New Prescriptions New Prescriptions   No medications on file   I personally performed the services described in this documentation, which was scribed in my presence. The recorded information has been reviewed and is accurate.    Anselm Pancoast, PA-C 05/10/16 0604    Anselm Pancoast, PA-C 05/10/16 1610    Devoria Albe, MD 05/10/16 [email or web address]

## 2016-05-17 ENCOUNTER — Inpatient Hospital Stay: Payer: Self-pay | Admitting: Oncology

## 2016-05-20 ENCOUNTER — Encounter: Payer: Self-pay | Admitting: Oncology

## 2016-06-05 ENCOUNTER — Emergency Department (HOSPITAL_COMMUNITY)
Admission: EM | Admit: 2016-06-05 | Discharge: 2016-06-05 | Disposition: A | Discharge status: Home / Self Care | Payer: Self-pay | Attending: Emergency Medicine | Admitting: Emergency Medicine

## 2016-06-05 ENCOUNTER — Encounter (HOSPITAL_COMMUNITY): Payer: Self-pay | Admitting: Nurse Practitioner

## 2016-06-05 DIAGNOSIS — F17210 Nicotine dependence, cigarettes, uncomplicated: Secondary | ICD-10-CM | POA: Insufficient documentation

## 2016-06-05 DIAGNOSIS — Z79899 Other long term (current) drug therapy: Secondary | ICD-10-CM | POA: Insufficient documentation

## 2016-06-05 DIAGNOSIS — Z139 Encounter for screening, unspecified: Secondary | ICD-10-CM | POA: Insufficient documentation

## 2016-06-05 NOTE — ED Notes (Signed)
Bed: [id number] Expected date:  Expected time:  Means of arrival:  Comments: EMS- 45yo M, depression/anxiety

## 2016-06-05 NOTE — ED Notes (Signed)
Delay in blood draw due to pt in bathroom

## 2016-06-05 NOTE — Discharge Instructions (Signed)
Try to stay sober Devun. Follow-up with your psychiatrist. Return here for new concerns.
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
## 2016-06-12 ENCOUNTER — Encounter (HOSPITAL_COMMUNITY): Payer: Self-pay | Admitting: Emergency Medicine

## 2016-06-12 ENCOUNTER — Emergency Department (HOSPITAL_COMMUNITY)
Admission: EM | Admit: 2016-06-12 | Discharge: 2016-06-13 | Disposition: A | Discharge status: Home / Self Care | Payer: Self-pay | Attending: Emergency Medicine | Admitting: Emergency Medicine

## 2016-06-12 DIAGNOSIS — F10120 Alcohol abuse with intoxication, uncomplicated: Secondary | ICD-10-CM | POA: Insufficient documentation

## 2016-06-12 DIAGNOSIS — F10920 Alcohol use, unspecified with intoxication, uncomplicated: Secondary | ICD-10-CM

## 2016-06-12 DIAGNOSIS — F17210 Nicotine dependence, cigarettes, uncomplicated: Secondary | ICD-10-CM | POA: Insufficient documentation

## 2016-06-12 NOTE — ED Triage Notes (Signed)
Per EMS patient found in a grassy area next to an apartment complex and had been observed drinking a bottle of listerine early in the day.  Patient reports to EMS that he has become increasingly depressed as it gets closer to the holidays and has been drinking more.  Per EMS patient has no complaints.  VS en route 118/88 PR 88, RR 22, 95% on RA.  106 CBG.

## 2016-06-12 NOTE — ED Notes (Signed)
ED Provider at bedside. 

## 2016-06-12 NOTE — ED Notes (Signed)
Pt comes by EMS with complaints of alcohol intoxication.  Pt unable to steadily ambulate on arrival.

## 2016-06-12 NOTE — ED Provider Notes (Signed)
[facility]-EMERGENCY DEPT Provider Note   CSN: [id number] Arrival date & time: 06/12/16  1914  By signing my name below, I, Linna DarnerRussell Turner, attest that this documentation has been prepared under the direction and in the presence of Audry Piliyler Jhonnie Aliano, PA-C. Electronically Signed: Linna Darnerussell Turner, Scribe. 06/12/2016. 8:35 PM.  History   Chief Complaint Chief Complaint  Patient presents with  . Alcohol Intoxication   The history is provided by the patient. No language interpreter was used.     HPI Comments: LEVEL 5 CAVEAT FOR ALCOHOL INTOXICATION Jordan Davidson is a 45 y.o. male brought in by EMS, with PMHx significant for alcohol abuse and depression, who presents to the Emergency Department complaining of alcohol intoxication. Per nursing staff, patient was found sleeping outside of an apartment complex shortly PTA and appeared very inebriated upon waking up. Pt states he had several beers and some tequila this morning; he states his last drink was around 11 AM today and he has mostly been sleeping since. He states he has a mild headache currently and believes this is a result of dehydration. He denies falling or hitting his head PTA. No other complaints noted at this time.   Past Medical History:  Diagnosis Date  . Alcohol abuse   . Alcoholism (HCC)   . Bipolar 1 disorder, depressed (HCC)   . Depression   . Thrombocytopenia [facility](HCC)     Patient Active Problem List   Diagnosis Date Noted  . Scalp laceration 05/01/2016  . Acute blood loss anemia 05/01/2016  . Thrombocytopenia (HCC) 05/01/2016  . Alcohol abuse 05/01/2016  . Blunt head trauma 04/28/2016  . Major depressive disorder, recurrent episode, moderate (HCC) 12/13/2015  . 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 Bipolar I disorder, most recent episode (or current) unspecified 02/14/2015  . Alcohol use disorder, severe, dependence (HCC) 02/13/2015    Past Surgical History:  Procedure Laterality Date  . NO PAST SURGERIES           Home Medications    Prior to Admission medications   Medication Sig Start Date End Date Taking? Authorizing Provider  bacitracin ointment Apply to scalp 2 times daily for 1 week. 05/03/16   Edson SnowballBrooke A Miller, PA-C  cephALEXin (KEFLEX) 500 MG capsule Take 1 capsule (500 mg total) by mouth 4 (four) times daily. 03/25/16   Nicole Pisciotta, PA-C  Multiple Vitamin (MULTIVITAMIN WITH MINERALS) TABS tablet Recommend taking a multivitamin once daily. You can get this over the counter. 05/03/16   Edson SnowballBrooke A Miller, PA-C  nicotine (NICODERM CQ - DOSED IN MG/24 HOURS) 21 mg/24hr patch Place 1 patch (21 mg total) onto the skin daily. 05/04/16   Edson SnowballBrooke A Miller, PA-C  traMADol (ULTRAM) 50 MG tablet Take 1-2 tablets (50-100 mg total) by mouth every 6 (six) hours. 05/03/16   Edson SnowballBrooke A Miller, PA-C    Family History Family History  Problem Relation Age of Onset  . Hypertension Mother   . Cancer Father   . Heart failure Father   . Alcoholism Father     Social History Social History  Substance Use Topics  . Smoking status: Current Every Day Smoker    Types: Cigarettes  . Smokeless tobacco: Never Used  . Alcohol use Yes     Comment: daily - "whatever I can get" - 1/5 plus daily     Allergies   Patient has no known allergies.   Review of Systems Review of Systems  Unable to perform ROS: Acuity of condition (alcohol intoxication)  A complete 10 system review of systems was obtained and all systems are negative except as noted in the HPI and PMH.   Physical Exam Updated Vital Signs BP 110/70 (BP Location: Right Arm)   Pulse 80   Temp 98.1 F (36.7 C) (Oral)   Resp 16   SpO2 93%   Physical Exam  Constitutional: He is oriented to person, place, and time. He appears well-developed and well-nourished. No distress.  Appears intoxicated.  HENT:  Head: Normocephalic and atraumatic.  Eyes: Conjunctivae and EOM are normal.  Neck: Neck supple. No tracheal deviation present.   Cardiovascular: Normal rate, regular rhythm, normal heart sounds and intact distal pulses.  Exam reveals no gallop and no friction rub.   No murmur heard. Pulmonary/Chest: Effort normal and breath sounds normal. No respiratory distress. He has no wheezes. He has no rales.  Musculoskeletal: Normal range of motion.  Neurological: He is alert and oriented to person, place, and time.  Skin: Skin is warm and dry.  Psychiatric: He has a normal mood and affect. His behavior is normal.  Nursing note and vitals reviewed.  ED Treatments / Results  Labs (all labs ordered are listed, but only abnormal results are displayed) Labs Reviewed - No data to display  EKG  EKG Interpretation None      Radiology No results found.  Procedures Procedures (including critical care time)  DIAGNOSTIC STUDIES: Oxygen Saturation is 93% on RA, adequate by my interpretation.    COORDINATION OF CARE: 8:40 PM Discussed treatment plan with pt at bedside and pt agreed to plan.  Medications Ordered in ED Medications - No data to display   Initial Impression / Assessment and Plan / ED Course  I have reviewed the triage vital signs and the nursing notes.  Pertinent labs & imaging results that were available during my care of the patient were reviewed by me and considered in my medical decision making (see chart for details).  Clinical Course    Final Clinical Impressions(s) / ED Diagnoses  I have reviewed the relevant previous healthcare records. I obtained HPI from historian.  ED Course:  Assessment: Pt is a 45yM with hx ETOH Abuse who presents due to ETOH intoxication. [place] park from [place]. No complaints at this time. Clinically intoxicated. Pt with care plan. On exam, pt in NAD. Nontoxic/nonseptic appearing. VSS. Afebrile. Lungs CTA. Heart RRR. Abdomen nontender soft. No SI or HI. Plan is to DC was clinically sober. At time of discharge, Patient is in no acute distress. Vital Signs are stable.  Patient is able to ambulate. Patient able to tolerate PO.   Disposition/Plan:  DC Home Additional Verbal discharge instructions given and discussed with patient.  Pt Instructed to f/u with PCP in the next week for evaluation and treatment of symptoms. Return precautions given Pt acknowledges and agrees with plan  Supervising Physician Tilden FossaElizabeth Rees, MD  Final diagnoses:  Alcoholic intoxication without complication [facility](HCC)    New Prescriptions New Prescriptions   No medications on file   I personally performed the services described in this documentation, which was scribed in my presence. The recorded information has been reviewed and is accurate.    Audry Piliyler Tenita Cue, PA-C 06/13/16 0135    Tilden FossaElizabeth Rees, MD 06/14/16 [phone]

## 2016-06-12 NOTE — ED Notes (Signed)
Bed: [facility] Expected date:  Expected time:  Means of arrival:  Comments: 

## 2016-06-13 NOTE — ED Notes (Signed)
Pt refused his discharge papers stating he "didn't need them".  Pt also refused to sign himself out.

## 2016-06-13 NOTE — ED Notes (Signed)
Staff tried to ambulate the pt in the hall, pt refuses stating "I don't want to do that right now, give me a few minutes when I feel better."

## 2016-06-13 NOTE — Discharge Instructions (Signed)
Please read and follow all provided instructions.  Your diagnoses today include:  1. Alcoholic intoxication without complication (HCC)     Tests performed today include: Vital signs. See below for your results today.   Medications prescribed:  Take as prescribed   Home care instructions:  Follow any educational materials contained in this packet.  Follow-up instructions: Please follow-up with your primary care provider for further evaluation of symptoms and treatment   Return instructions:  Please return to the Emergency Department if you do not get better, if you get worse, or new symptoms OR  - Fever (temperature greater than 101.86F)  - Bleeding that does not stop with holding pressure to the area    -Severe pain (please note that you may be more sore the day after your accident)  - Chest Pain  - Difficulty breathing  - Severe nausea or vomiting  - Inability to tolerate food and liquids  - Passing out  - Skin becoming red around your wounds  - Change in mental status (confusion or lethargy)  - New numbness or weakness    Please return if you have any other emergent concerns.  Additional Information:  Your vital signs today were: BP 100/66 (BP Location: Right Arm)    Pulse 102    Temp 98.1 F (36.7 C) (Oral)    Resp 18    SpO2 94%  If your blood pressure (BP) was elevated above 135/85 this visit, please have this repeated by your doctor within one month. ---------------

## 2016-06-17 ENCOUNTER — Emergency Department (HOSPITAL_COMMUNITY)
Admission: EM | Admit: 2016-06-17 | Discharge: 2016-06-17 | Disposition: A | Discharge status: Home / Self Care | Payer: Self-pay | Attending: Emergency Medicine | Admitting: Emergency Medicine

## 2016-06-17 ENCOUNTER — Encounter (HOSPITAL_COMMUNITY): Payer: Self-pay | Admitting: Nurse Practitioner

## 2016-06-17 DIAGNOSIS — F10129 Alcohol abuse with intoxication, unspecified: Secondary | ICD-10-CM | POA: Insufficient documentation

## 2016-06-17 DIAGNOSIS — Z79899 Other long term (current) drug therapy: Secondary | ICD-10-CM | POA: Insufficient documentation

## 2016-06-17 DIAGNOSIS — F10920 Alcohol use, unspecified with intoxication, uncomplicated: Secondary | ICD-10-CM

## 2016-06-17 DIAGNOSIS — F17210 Nicotine dependence, cigarettes, uncomplicated: Secondary | ICD-10-CM | POA: Insufficient documentation

## 2016-06-17 NOTE — ED Triage Notes (Signed)
Per EMS report: pt was found in parking lot of [employer] with ETOH on board. Pt reports 10-12 ETOH beverages today. Pt reported chest pain when he was being arrested. Pt denies any chest pain at this time.  Pt a/o x 4 and ambulatory with assistance.

## 2016-06-17 NOTE — ED Provider Notes (Signed)
[facility]-EMERGENCY DEPT Provider Note   CSN: [id number] Arrival date & time: 06/17/16  1704     History   Chief Complaint Chief Complaint  Patient presents with  . Alcohol Intoxication    HPI Jordan Davidson is a 45 y.o. male.  HPI [id number] year old male who presents with acute alcohol intoxication. He has a history of chronic alcohol abuse and bipolar disorder. He is frequently seen in the ED for acute alcohol intoxication. Patient was found in the parking lot of [employer] today by EMS. They report that he endorses drinking 10-12 alcoholic beverages today. He denies any complaints in ED.   Level V caveat due to alcohol intoxication.   Past Medical History:  Diagnosis Date  . Alcohol abuse   . Alcoholism (HCC)   . Bipolar 1 disorder, depressed (HCC)   . Depression   . Thrombocytopenia [facility](HCC)     Patient Active Problem List   Diagnosis Date Noted  . Scalp laceration 05/01/2016  . Acute blood loss anemia 05/01/2016  . Thrombocytopenia (HCC) 05/01/2016  . Alcohol abuse 05/01/2016  . Blunt head trauma 04/28/2016  . Major depressive disorder, recurrent episode, moderate (HCC) 12/13/2015  . 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 Bipolar I disorder, most recent episode (or current) unspecified 02/14/2015  . Alcohol use disorder, severe, dependence (HCC) 02/13/2015    Past Surgical History:  Procedure Laterality Date  . NO PAST SURGERIES         Home Medications    Prior to Admission medications   Medication Sig Start Date End Date Taking? Authorizing Provider  bacitracin ointment Apply to scalp 2 times daily for 1 week. Patient not taking: Reported on 06/12/2016 05/03/16   Edson SnowballBrooke A Miller, PA-C  cephALEXin (KEFLEX) 500 MG capsule Take 1 capsule (500 mg total) by mouth 4 (four) times daily. Patient not taking: Reported on 06/12/2016 03/25/16   Joni ReiningNicole Pisciotta, PA-C  Multiple Vitamin (MULTIVITAMIN WITH MINERALS) TABS tablet Recommend taking a  multivitamin once daily. You can get this over the counter. Patient not taking: Reported on 06/12/2016 05/03/16   Edson SnowballBrooke A Miller, PA-C  nicotine (NICODERM CQ - DOSED IN MG/24 HOURS) 21 mg/24hr patch Place 1 patch (21 mg total) onto the skin daily. Patient not taking: Reported on 06/12/2016 05/04/16   Edson SnowballBrooke A Miller, PA-C  traMADol (ULTRAM) 50 MG tablet Take 1-2 tablets (50-100 mg total) by mouth every 6 (six) hours. Patient not taking: Reported on 06/12/2016 05/03/16   Edson SnowballBrooke A Miller, PA-C    Family History Family History  Problem Relation Age of Onset  . Hypertension Mother   . Cancer Father   . Heart failure Father   . Alcoholism Father     Social History Social History  Substance Use Topics  . Smoking status: Current Every Day Smoker    Types: Cigarettes  . Smokeless tobacco: Never Used  . Alcohol use Yes     Comment: daily - "whatever I can get" - 1/5 plus daily     Allergies   Patient has no known allergies.   Review of Systems Review of Systems  Unable to obtain due to intoxication  Physical Exam Updated Vital Signs BP 115/69 (BP Location: Right Arm)   Pulse 86   Temp 98.2 F (36.8 C) (Oral)   Resp 14   SpO2 96%   Physical Exam Physical Exam  Nursing note and vitals reviewed. Constitutional:  non-toxic, and in no acute distress Head: Normocephalic and atraumatic.  Mouth/Throat: Oropharynx is  clear and moist.  Neck: Normal range of motion. Neck supple.  Cardiovascular: Normal rate and regular rhythm.   Pulmonary/Chest: Effort normal and breath sounds normal.  Abdominal: Soft. There is no tenderness. There is no rebound and no guarding.  Musculoskeletal: Normal range of motion.  Neurological: somnolent, arouses to voice and touch but not cooperative with much of exam, no facial droop, moves all extremities symmetrically Skin: Skin is warm and dry.  Psychiatric: Cooperative   ED Treatments / Results  Labs (all labs ordered are listed, but only  abnormal results are displayed) Labs Reviewed - No data to display  EKG  EKG Interpretation None       Radiology No results found.  Procedures Procedures (including critical care time)  Medications Ordered in ED Medications - No data to display   Initial Impression / Assessment and Plan / ED Course  I have reviewed the triage vital signs and the nursing notes.  Pertinent labs & imaging results that were available during my care of the patient were reviewed by me and considered in my medical decision making (see chart for details).  Clinical Course    Frequent ED visits for alcohol intoxication. He is nontoxic in no acute distress with normal vital signs. Endorses drinking alcohol today. No signs of trauma and no complaints. Observed here in the emergency department and was seen ambulatory. Patient eloped from ED prior to my reevaluation.  Final Clinical Impressions(s) / ED Diagnoses   Final diagnoses:  Acute alcoholic intoxication without complication [facility](HCC)    New Prescriptions Discharge Medication List as of 06/17/2016  9:12 PM       Lavera Guiseana Duo Lakeita Panther, MD 06/18/16 [phone]

## 2016-06-17 NOTE — ED Notes (Signed)
Bed: [facility] Expected date:  Expected time:  Means of arrival:  Comments: No bed. 

## 2016-06-18 ENCOUNTER — Encounter (HOSPITAL_COMMUNITY): Payer: Self-pay | Admitting: Emergency Medicine

## 2016-06-18 ENCOUNTER — Emergency Department (HOSPITAL_COMMUNITY)
Admission: EM | Admit: 2016-06-18 | Discharge: 2016-06-18 | Disposition: A | Discharge status: Home / Self Care | Payer: Self-pay | Attending: Emergency Medicine | Admitting: Emergency Medicine

## 2016-06-18 DIAGNOSIS — F10129 Alcohol abuse with intoxication, unspecified: Secondary | ICD-10-CM | POA: Insufficient documentation

## 2016-06-18 DIAGNOSIS — F10929 Alcohol use, unspecified with intoxication, unspecified: Secondary | ICD-10-CM

## 2016-06-18 DIAGNOSIS — F17210 Nicotine dependence, cigarettes, uncomplicated: Secondary | ICD-10-CM | POA: Insufficient documentation

## 2016-06-18 NOTE — ED Provider Notes (Signed)
[facility]-EMERGENCY DEPT Provider Note   CSN: [id number] Arrival date & time: 06/18/16  0208     History   Chief Complaint Chief Complaint  Patient presents with  . Alcohol Intoxication    HPI Jordan Nancyodd Maurice Davidson is a 45 y.o. male.  HPI Jordan Nancyodd Maurice Davidson is a 45 y.o. male with history of bipolar disorder, alcohol abuse, depression, presents to emergency department by EMS. Patient states he does not remember what happened, he apparently was drinking last night and [facility] That is the last thing he remembers. He admits to drinking large amount of alcohol but unsure how much. Patient was picked up by EMS after being found unconscious. Patient with multiple ED visits for the same. Patient has been in the waiting room from was 5 hours prior to me seeing him and now alert, oriented, asking for something to drink. Patient has a care plan. Patient denies any headache, no nausea or vomiting. Denies any abdominal pain. Denies any known trauma. Patient is homeless. Denies any drugs.  Past Medical History:  Diagnosis Date  . Alcohol abuse   . Alcoholism (HCC)   . Bipolar 1 disorder, depressed (HCC)   . Depression   . Thrombocytopenia [facility](HCC)     Patient Active Problem List   Diagnosis Date Noted  . Scalp laceration 05/01/2016  . Acute blood loss anemia 05/01/2016  . Thrombocytopenia (HCC) 05/01/2016  . Alcohol abuse 05/01/2016  . Blunt head trauma 04/28/2016  . Major depressive disorder, recurrent episode, moderate (HCC) 12/13/2015  . 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 Bipolar I disorder, most recent episode (or current) unspecified 02/14/2015  . Alcohol use disorder, severe, dependence (HCC) 02/13/2015    Past Surgical History:  Procedure Laterality Date  . NO PAST SURGERIES         Home Medications    Prior to Admission medications   Medication Sig Start Date End Date Taking? Authorizing Provider  bacitracin ointment Apply  to scalp 2 times daily for 1 week. Patient not taking: Reported on 06/12/2016 05/03/16   Edson SnowballBrooke A Miller, PA-C  cephALEXin (KEFLEX) 500 MG capsule Take 1 capsule (500 mg total) by mouth 4 (four) times daily. Patient not taking: Reported on 06/12/2016 03/25/16   Joni ReiningNicole Pisciotta, PA-C  Multiple Vitamin (MULTIVITAMIN WITH MINERALS) TABS tablet Recommend taking a multivitamin once daily. You can get this over the counter. Patient not taking: Reported on 06/12/2016 05/03/16   Edson SnowballBrooke A Miller, PA-C  nicotine (NICODERM CQ - DOSED IN MG/24 HOURS) 21 mg/24hr patch Place 1 patch (21 mg total) onto the skin daily. Patient not taking: Reported on 06/12/2016 05/04/16   Edson SnowballBrooke A Miller, PA-C  traMADol (ULTRAM) 50 MG tablet Take 1-2 tablets (50-100 mg total) by mouth every 6 (six) hours. Patient not taking: Reported on 06/12/2016 05/03/16   Edson SnowballBrooke A Miller, PA-C    Family History Family History  Problem Relation Age of Onset  . Hypertension Mother   . Cancer Father   . Heart failure Father   . Alcoholism Father     Social History Social History  Substance Use Topics  . Smoking status: Current Every Day Smoker    Types: Cigarettes  . Smokeless tobacco: Never Used  . Alcohol use Yes     Comment: daily - "whatever I can get" - 1/5 plus daily     Allergies   Patient has no known allergies.   Review of Systems Review of Systems  Constitutional: Negative for  chills and fever.  Respiratory: Negative for cough, chest tightness and shortness of breath.   Cardiovascular: Negative for chest pain, palpitations and leg swelling.  Gastrointestinal: Negative for abdominal distention, abdominal pain, diarrhea, nausea and vomiting.  Musculoskeletal: Negative for arthralgias, myalgias, neck pain and neck stiffness.  Skin: Negative for rash.  Allergic/Immunologic: Negative for immunocompromised state.  Neurological: Negative for dizziness, weakness, light-headedness, numbness and headaches.    Psychiatric/Behavioral: Positive for dysphoric mood. Negative for self-injury and suicidal ideas. The patient is nervous/anxious.      Physical Exam Updated Vital Signs BP 117/74   Pulse 97   Temp 98.3 F (36.8 C) (Oral)   Resp 18   Ht 6' (1.829 m)   Wt 83.9 kg   SpO2 (!) 89%   BMI 25.09 kg/m   Physical Exam  Constitutional: He appears well-developed and well-nourished. No distress.  HENT:  Head: Normocephalic and atraumatic.  Eyes: Conjunctivae are normal.  Neck: Neck supple.  Cardiovascular: Normal rate, regular rhythm and normal heart sounds.   Pulmonary/Chest: Effort normal. No respiratory distress. He has no wheezes. He has no rales.  Abdominal: Soft. Bowel sounds are normal. He exhibits no distension. There is no tenderness. There is no rebound.  Musculoskeletal: He exhibits no edema.  Neurological: He is alert.  Skin: Skin is warm and dry.  Nursing note and vitals reviewed.    ED Treatments / Results  Labs (all labs ordered are listed, but only abnormal results are displayed) Labs Reviewed - No data to display  EKG  EKG Interpretation None       Radiology No results found.  Procedures Procedures (including critical care time)  Medications Ordered in ED Medications - No data to display   Initial Impression / Assessment and Plan / ED Course  I have reviewed the triage vital signs and the nursing notes.  Pertinent labs & imaging results that were available during my care of the patient were reviewed by me and considered in my medical decision making (see chart for details).  Clinical Course    [place] emergency department with alcohol intoxication, was picked up by EMS unconscious. He is now alert and awake. He was able to drink the emergency department and ambulated back and forth in the hallway to the bathroom. He is not showing any signs of withdrawal at this time. He stable on his feet. He'll be discharged home with follow-up.  Vitals:    06/18/16 0700 06/18/16 0715 06/18/16 0730 06/18/16 0745  BP: 99/80 137/91 173/90 133/88  Pulse: 100 100 98 99  Resp:      Temp:      TempSrc:      SpO2: 93% 94% 92% 92%  Weight:      Height:        Final Clinical Impressions(s) / ED Diagnoses   Final diagnoses:  Alcoholic intoxication with complication [facility](HCC)    New Prescriptions New Prescriptions   No medications on file     Jaynie Crumbleatyana Ernestina Joe, PA-C 06/18/16 [id number]    Linwood DibblesJon Knapp, MD 06/18/16 [phone]

## 2016-06-18 NOTE — ED Notes (Signed)
Called patient for vitals, no answer. [facility] 

## 2016-06-18 NOTE — ED Triage Notes (Signed)
Pt reports drinking"a bunch of alcohol" states a couple of fifths. Pt reports pain in abdomen starting this morning.

## 2016-06-18 NOTE — ED Notes (Signed)
Pt up to [facility] to void ,got breakfast, states he doesn't remember how he got here

## 2016-06-18 NOTE — Discharge Instructions (Signed)
Stop alcohol. Follow up with family doctor.

## 2016-06-19 ENCOUNTER — Encounter (HOSPITAL_COMMUNITY): Payer: Self-pay | Admitting: Emergency Medicine

## 2016-06-19 DIAGNOSIS — F17210 Nicotine dependence, cigarettes, uncomplicated: Secondary | ICD-10-CM | POA: Insufficient documentation

## 2016-06-19 DIAGNOSIS — F10120 Alcohol abuse with intoxication, uncomplicated: Secondary | ICD-10-CM | POA: Insufficient documentation

## 2016-06-19 DIAGNOSIS — Z590 Homelessness: Secondary | ICD-10-CM | POA: Insufficient documentation

## 2016-06-19 NOTE — ED Triage Notes (Signed)
Pt brought in by EMS after being picked up at a gas station being intoxicated.  Pt seen here several times before for the same.  Pt able to transfer from stretcher to triage chair.  No other complaints at this time. Denies SI/HI.

## 2016-06-20 ENCOUNTER — Emergency Department (HOSPITAL_COMMUNITY)
Admission: EM | Admit: 2016-06-20 | Discharge: 2016-06-20 | Disposition: A | Discharge status: Home / Self Care | Payer: Self-pay | Attending: Emergency Medicine | Admitting: Emergency Medicine

## 2016-06-20 DIAGNOSIS — F10920 Alcohol use, unspecified with intoxication, uncomplicated: Secondary | ICD-10-CM

## 2016-06-20 DIAGNOSIS — Z5900 Homelessness unspecified: Secondary | ICD-10-CM

## 2016-06-20 NOTE — Discharge Instructions (Signed)
RESOURCE GUIDE ° °Chronic Pain Problems: °Contact [facility]  [phone] °Patients need to be referred by their primary care doctor. ° °Insufficient Money for Medicine: °Contact United Way:  call "211" or [facility] 271-5999. ° °No Primary Care Doctor: °Call [facility]  832-8000 - can help you locate a primary care doctor that  accepts your insurance, provides certain services, etc. °Physician Referral Service- 1-800-533-3463 ° °Agencies that provide inexpensive medical care: °[facility]  832-8035 °[facility]  832-7272 °[facility]  [phone] °Women's Clinic  832-4777 °Planned Parenthood  373-0678 °[facility]  272-1050 ° °Medicaid-accepting [place] Providers: °[facility]- [address] ° 641-2100, Mon-Fri 9am-7pm, Sat 9am-1pm °[facility]- [address], [address] ° [phone] °[facility]- [address] ° [phone] °[facility]- [address] [place] [address] ° [phone] °[facility]- [address], [phone] ° Only accepts [place] Access Medicaid patients after they have their name  applied to their card ° °Self Pay (no insurance) in [place]: °Sickle Cell Patients: Dr Eric Dean, [facility] ° [address], [phone] °[facility]- [address] ° [phone] °      -      [facility]- [address] °      -     [facility]- see information above (Speak to Pam H if you do not have insurance) °      -  [facility]- [address], 271-5999 °      -  [facility] [place]- [address],  [phone] °      -  [facility]- [address] [place] [address], [phone] °      -  Dr Osei-Bonsu-  [address], [place], [phone] °      -  [facility]- [address], [phone] °      -   [facility]- [address] [place] [address], [phone], also [address], [phone] °      -    [facility]- [address], [phone], 1st & 3rd Saturday   every month, 10am-1pm ° °1) Find a Doctor and Pay Out of Pocket °Although you won't have to find out who is covered by your insurance plan, it is a good idea to ask around and get recommendations. You will then need to call the office and see if the doctor you have chosen will accept you as a new patient and what types of options they offer for patients who are self-pay. Some doctors offer discounts or will set up payment plans for their patients who do not have insurance, but you will need to ask so you aren't surprised when you get to your appointment. ° °2) Contact Your Local Health Department °Not all health departments have doctors that can see patients for sick visits, but many do, so it is worth a call to see if yours does. If you don't know where your local health department is, you can check in your phone book. The CDC also has a tool to help you locate your state's health department, and many state websites also have listings of all of their local health departments. ° °3) Find a Walk-in Clinic °If   your illness is not likely to be very severe or complicated, you may want to try a walk in clinic. These are popping up all over the country in pharmacies, drugstores, and shopping centers. They're usually staffed by nurse practitioners or physician assistants that have been trained to treat common illnesses and complaints. They're usually fairly quick and inexpensive. However, if you have serious medical issues or chronic medical problems, these are probably not your best option ° °STD Testing °[place] [facility] [place], [facility] Clinic, [address], [place], phone [phone] or 1-877-539-9860.  Monday - Friday, call for an appointment. °[place] [facility] [place], STD  Clinic, [address], [place], phone [phone] or 1-877-539-9860.  Monday - Friday, call for an appointment. ° °Abuse/Neglect: °[place] Child Abuse Hotline (336) 641-3795 °[place] Child Abuse Hotline 800-378-5315 (After Hours) ° °Emergency Shelter:  [facility] (336) 271-5985 ° °Maternity Homes: °[facility] (336) 275-9566 °[facility] (704) 372-4663 ° °MRSA Hotline #:   832-7006 ° °[place] Resources ° °[facility] [place]  [employer] [place] [facility] °[address]                 [address]         [address]  °[place]                                               [place]                              [place] °Phone:  349-3220                                  Phone:  342-7768                   Phone:  342-8140 ° °[facility], 342-8316 °[place] [facility] - [facility]- 1-888-581-9988 °      -     [facility] in [place], [address],                                  336-349-4454, Insurance ° °[place] Child Abuse Hotline °(336) 342-1394 or (336) 342-3537 (After Hours) ° ° °Behavioral Health Services ° °Substance Abuse Resources: °[facility]  336-882-2125 °[facility] 336-784-9470 °[facility] 336-285-9073 °[facility] 336-845-3988 °Residential & Outpatient Substance Abuse Program  800-659-3381 ° °Psychological Services: ° Health  832-9600 °[facility]  378-7881 °[facility], [address], [place], ACCESS LINE: 1-800-853-5163 or 336-641-4981, [email or web address] ° °Dental Assistance ° °If unable to pay or uninsured, contact:  [facility] or [facility] [facility] to become qualified for the adult dental clinic. ° °Patients with Medicaid: [facility] °[address], 632-0744 °[address], 510-2600 ° °If unable to pay, or uninsured, contact [facility] (271-5999) or [place] [facility] (641-3152 in [place], 842-7733 in [place]) to become qualified for the adult dental clinic ° °Other Low-Cost   Community Dental Services: °[facility]- [address], [place], [place], [address], 723-1848, [address]. [address], 2nd and 4th Thursday of the month at 6:30am.  10 clients each day by appointment, can sometimes see walk-in patients if someone does not show for an appointment. °[facility]- [address], [place], [place], [address], 723-7904 °[facility]- [address], [place], [place], [address], 631-2330 °[facility] [facility]- 342-8273 °[facility]- 703-3100 °[facility]- 570-6415 ° °Please make every effort to establish with a primary care physician for routine medical care ° °Adult Health Services  °The [place] [facility] provides a wide range of adult health services. Some of these services are designed to address the healthcare needs of all [place] residents and all services are designed to meet the needs of uninsured/underinsured low income residents. Some services are available to any resident of [place], call 641-7777 for details. °] °[facility], a new medical clinic for adults, is now open. For more information about the Center and its services please call [phone]. °For information on our Refugee Health services, click here. ° °For more information on any of the following Department of Public Health programs, including hours of service, click on the highlighted link. ° °SERVICES FOR WOMEN (Adults and Teens) °Family Planning Services provide a full range of birth control options plus education and counseling. New patient visit and annual return visits include a complete  examination, pap test as indicated, and other laboratory as indicated. Included is our Regional Vasectomy Program for men. ° °Maternity Care is provided through pregnancy, including a six week post partum exam. Women who meet eligibility criteria for the Medicaid for Pregnant Women program, receive care free. Other women are charged on a sliding scale according to income. °Note: [facility] provides services to pregnant women who have a Medicaid card. Call 641-3152 for an appointment in [place] or [phone] for an appointment in [place]. ° °Primary Care for Medicaid [place] Access Women is available through the [place] [facility]. As primary care provider for the [place] Medicaid [place] Access Medicaid Managed Care program, women may designate the Women?s Health clinic as their primary care provider. ° °PLEASE CALL 641-3245 FOR AN APPOINTMENT FOR THE ABOVE SERVICES IN EITHER [place] OR [place]. Information available in English and Spanish.  ° °Childbirth Education Classes are open to the public and offered to help families prepare for the best possible childbirth experience as well as to promote lifelong health and wellness. Classes are offered throughout the year and meet on the same night once a week for five weeks. Medicaid covers the cost of the classes for the mother-to-be and her partner. For participants without Medicaid, the cost of the class series is $45.00 for the mother-to-be and her partner. Class size is limited and registration is required. For more information or to register call 336-641-4718. Baby items donated by [employer] and the [employer] [place] are given away during each class series. ° °SERVICES FOR WOMEN AND MEN °Sexually Transmitted Infection appointments, including HIV testing, are available daily (weekdays, except holidays). Call early as same-day appointments are limited. For an appointment in either [place] or [place],  call [phone]. Services are confidential and free of charge. ° °Skin Testing for Tuberculosis Please call [phone]. °Adult Immunizations are available, usually for a fee. Please call [phone] for details. ° °PLEASE CALL [phone] FOR AN APPOINTMENT FOR THE ABOVE   SERVICES IN EITHER [place] OR [place].  ° °[facility] provides up to the minute recommended vaccines for your travel destination. We also provide essential health and political information to help insure a safe and pleasurable travel experience. This program is self-sustaining, however, fees are very competitive. We are a CERTIFIED YELLOW FEVER IMMUNIZATION approved clinic site. °PLEASE CALL 641-3245 FOR AN APPOINTMENT IN EITHER [place] OR [place].  ° °If you have questions about the services listed above, we want to answer them! Email us at: [email or web address] °Home Visiting Services for elderly and the disabled are available to residents of [place] who are in need of care that compares to the care offered by a nursing home, have needs that can be met by the program, and have [place] Medicaid. Other short term services are available to residents 18 years and older who are unable to meet requirements for eligibility to receive services from a certified home health agency, spend the majority of time at home, and need care for six months or less. ° °PLEASE CALL [phone] OR 641-3809 FOR MORE INFORMATION. °Medication Assistance Program serves as a link between pharmaceutical companies and patients to provide low cost or free prescription medications. This servce is available for residents who meet certain income restrictions and have no insurance coverage. ° °PLEASE CALL 641-8030 () OR 641-7620 ([place]) FOR MORE INFORMATION.  °Updated Feb. 21, 2013 ° ° °

## 2016-06-20 NOTE — ED Notes (Signed)
Attempting to give discharge information but pt no found in room

## 2016-06-20 NOTE — ED Provider Notes (Signed)
[facility]-EMERGENCY DEPT Provider Note   CSN: [id number] Arrival date & time: 06/19/16  2346  By signing my name below, I, Doreatha MartinEva Mathews, attest that this documentation has been prepared under the direction and in the presence of Marlon Peliffany Feras Gardella, PA-C. Electronically Signed: Doreatha MartinEva Mathews, ED Scribe. 06/20/16. 12:50 AM.    History   Chief Complaint Chief Complaint  Patient presents with  . Alcohol Intoxication    HPI Jordan Davidson is a 45 y.o. male brought in by ambulance with h/o alcohol abuse, bipolar disorder, depression who presents to the Emergency Department for medical clearance tonight by EMS picked who were called to pick pt up in a gas station parking lot because he was  intoxicated. Pt states he drank "too much" liquor and beer, and does not specify an amount. He does not remember how he arrived here or who called EMS. Pt does not currently have any complaints. He reports being here to sleep and get some food. Requests blankets, the door closed, lights off, for me to be quite and go away. He is laying on the floor, he says the stretcher is not comfortable. It has been documented that he has been up and walking around. He denies CP, SOB, abdominal pain, SI, additional concerns.   The history is provided by the patient. No language interpreter was used.    Past Medical History:  Diagnosis Date  . Alcohol abuse   . Alcoholism (HCC)   . Bipolar 1 disorder, depressed (HCC)   . Depression   . Thrombocytopenia [facility](HCC)     Patient Active Problem List   Diagnosis Date Noted  . Scalp laceration 05/01/2016  . Acute blood loss anemia 05/01/2016  . Thrombocytopenia (HCC) 05/01/2016  . Alcohol abuse 05/01/2016  . Blunt head trauma 04/28/2016  . Major depressive disorder, recurrent episode, moderate (HCC) 12/13/2015  . 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 Bipolar I disorder, most recent episode (or current) unspecified 02/14/2015  . Alcohol use disorder, severe, dependence  (HCC) 02/13/2015    Past Surgical History:  Procedure Laterality Date  . NO PAST SURGERIES       Home Medications    Prior to Admission medications   Not on File    Family History Family History  Problem Relation Age of Onset  . Hypertension Mother   . Cancer Father   . Heart failure Father   . Alcoholism Father     Social History Social History  Substance Use Topics  . Smoking status: Current Every Day Smoker    Types: Cigarettes  . Smokeless tobacco: Never Used  . Alcohol use Yes     Comment: daily - "whatever I can get" - 1/5 plus daily     Allergies   Patient has no known allergies.   Review of Systems Review of Systems  Respiratory: Negative for shortness of breath.   Cardiovascular: Negative for chest pain.  Gastrointestinal: Negative for abdominal pain.  Psychiatric/Behavioral: Negative for suicidal ideas.  All other systems reviewed and are negative.    Physical Exam Updated Vital Signs BP 127/87 (BP Location: Left Arm)   Pulse 96   Temp 98 F (36.7 C) (Oral)   Resp 18   SpO2 97%   Physical Exam  Constitutional: He appears well-developed and well-nourished.  disheveled.  HENT:  Head: Normocephalic.  Eyes: Conjunctivae are normal.  Cardiovascular: Normal rate.   Pulmonary/Chest: Effort normal. No respiratory distress.  Abdominal: He exhibits no distension.  Musculoskeletal: Normal range of motion.  Neurological: He is alert.  Skin: Skin is warm and dry.  Psychiatric: He has a normal mood and affect. He is agitated. He expresses no homicidal and no suicidal ideation.  Pt able to stand up and walk, becomes agitated with questions and says "What part of I don't f*cking know don't you understand?"  Nursing note and vitals reviewed.    ED Treatments / Results   DIAGNOSTIC STUDIES: Oxygen Saturation is 97% on RA, normal by my interpretation.    COORDINATION OF CARE: 12:49 AM Discussed treatment plan with pt at bedside which includes  d/c and pt agreed to plan.      Labs (all labs ordered are listed, but only abnormal results are displayed) Labs Reviewed - No data to display  EKG  EKG Interpretation None       Radiology No results found.  Procedures Procedures (including critical care time)  Medications Ordered in ED Medications - No data to display   Initial Impression / Assessment and Plan / ED Course  I have reviewed the triage vital signs and the nursing notes.  Pertinent labs & imaging results that were available during my care of the patient were reviewed by me and considered in my medical decision making (see chart for details).  Clinical Course     Patient has a care plan that has been reviewed. He frequents the ED for ETOH intoxication, admits to large amount of alcohol today. He has been up and walking around- no signs of withdrawal, he tells me he wants sleep and food, for me to be quite, shot the door and a blanket. Patient cleared for discharge at this time.  Final Clinical Impressions(s) / ED Diagnoses   Final diagnoses:  Alcoholic intoxication without complication [facility](HCC)  Homeless    New Prescriptions New Prescriptions   No medications on file    I personally performed the services described in this documentation, which was scribed in my presence. The recorded information has been reviewed and is accurate.    Marlon Peliffany Petros Ahart, PA-C 06/20/16 [phone]    Gilda Creasehristopher J Pollina, MD 06/20/16 [phone]

## 2016-06-20 NOTE — ED Notes (Signed)
Pt continued to cuss out PA.  Stated he planned on sleeping here and getting a meal.  PA stated he is medically cleared and ready for discharge.  When pt heard of this pt slammed door.  RN to go over discharge instructions with patient.

## 2016-08-04 ENCOUNTER — Emergency Department (HOSPITAL_COMMUNITY): Admit: 2016-08-04 | Discharge: 2016-08-05 | Discharge status: Against Medical Advice | Payer: Self-pay

## 2016-08-04 NOTE — ED Notes (Signed)
Registration alerted this writer that pt was stumbling in lobby and stating he was going to smoke.  Pt found outside smoking.  Refused to put cigarette out or come in for triage.

## 2016-08-04 NOTE — ED Triage Notes (Signed)
[facility]  Patient had been drinking and is requesting detox.  Per EMS no other complaints.

## 2016-08-05 ENCOUNTER — Encounter (HOSPITAL_COMMUNITY): Payer: Self-pay | Admitting: Emergency Medicine

## 2016-08-05 ENCOUNTER — Emergency Department (HOSPITAL_COMMUNITY)
Admission: EM | Admit: 2016-08-05 | Discharge: 2016-08-05 | Disposition: A | Discharge status: Home / Self Care | Payer: Self-pay | Attending: Emergency Medicine | Admitting: Emergency Medicine

## 2016-08-05 ENCOUNTER — Emergency Department (HOSPITAL_COMMUNITY)
Admission: EM | Admit: 2016-08-05 | Discharge: 2016-08-05 | Disposition: A | Discharge status: Home / Self Care | Payer: Self-pay | Attending: Dermatology | Admitting: Dermatology

## 2016-08-05 ENCOUNTER — Emergency Department (HOSPITAL_COMMUNITY): Admission: EM | Admit: 2016-08-05 | Discharge: 2016-08-05 | Discharge status: Absent without leave | Payer: Self-pay

## 2016-08-05 DIAGNOSIS — F17210 Nicotine dependence, cigarettes, uncomplicated: Secondary | ICD-10-CM | POA: Insufficient documentation

## 2016-08-05 DIAGNOSIS — F10239 Alcohol dependence with withdrawal, unspecified: Secondary | ICD-10-CM | POA: Insufficient documentation

## 2016-08-05 DIAGNOSIS — Z5321 Procedure and treatment not carried out due to patient leaving prior to being seen by health care provider: Secondary | ICD-10-CM | POA: Insufficient documentation

## 2016-08-05 DIAGNOSIS — F10920 Alcohol use, unspecified with intoxication, uncomplicated: Secondary | ICD-10-CM

## 2016-08-05 DIAGNOSIS — F10120 Alcohol abuse with intoxication, uncomplicated: Secondary | ICD-10-CM | POA: Insufficient documentation

## 2016-08-05 MED ORDER — SODIUM CHLORIDE 0.9 % IV BOLUS (SEPSIS)
1000.0000 mL | Freq: Once | INTRAVENOUS | Status: AC
  Administered 2016-08-05: 1000 mL via INTRAVENOUS

## 2016-08-05 NOTE — ED Notes (Signed)
Tegelar aware pt steady gait; at bedside. Pt refuses to sign. Pt escorted to exit by security post discharge.

## 2016-08-05 NOTE — ED Notes (Addendum)
PT found sleeping in triage restroom by staff. PT request to been seen by MD. PT check by in went to use restroom to go to sleep. PT now in tri 4 PT request to sleep on floor this writer info PT could not lay on floor PT became upset security called to room. PT sat in chair

## 2016-08-05 NOTE — ED Triage Notes (Signed)
Pt ambulated to and from bathroom without assist.

## 2016-08-05 NOTE — ED Provider Notes (Signed)
[facility]-EMERGENCY DEPT Provider Note   CSN: [id number] Arrival date & time: 08/05/16  0903     History   Chief Complaint Chief Complaint  Patient presents with  . detox    HPI Jordan Davidson is a 46 y.o. male with a past medical history significant for bipolar disorder and alcohol abuse who presents with intoxication. Patient has a care plan in place which was acknowledged upon initial evaluation. According to nursing, patient was trying to sleep in the emergency department waiting room all night. This included falling asleep in the bathroom several times and laying on the floor when patient was brought back to the exam room, he subsequently continues to try and lateral fluoroscopy.  On my initial history gathering, patient says that he is drunk and sleepy. He denies any traumatic injuries and denies any falls. He denies any pain in any locations. He said he just wants to sleep. He says that he drank wine and beer and liquor last night. He is unsure how much he drank. Patient said that he would like to stop drinking. Patient was noted to have a flask tucked into his belt. Patient alert and oriented and had no other complaints. She denied any nausea or vomiting. As a fevers, chills, chest pain or shortness of breath.    HPI  Past Medical History:  Diagnosis Date  . Alcohol abuse   . Alcoholism (HCC)   . Bipolar 1 disorder, depressed (HCC)   . Depression   . Thrombocytopenia [facility])     Patient Active Problem List   Diagnosis Date Noted  . Scalp laceration 05/01/2016  . Acute blood loss anemia 05/01/2016  . Thrombocytopenia (HCC) 05/01/2016  . Alcohol abuse 05/01/2016  . Blunt head trauma 04/28/2016  . Major depressive disorder, recurrent episode, moderate (HCC) 12/13/2015  . 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 Bipolar I disorder, most recent episode (or current) unspecified 02/14/2015  . Alcohol use disorder, severe, dependence (HCC) 02/13/2015    Past  Surgical History:  Procedure Laterality Date  . NO PAST SURGERIES         Home Medications    Prior to Admission medications   Not on File    Family History Family History  Problem Relation Age of Onset  . Hypertension Mother   . Cancer Father   . Heart failure Father   . Alcoholism Father     Social History Social History  Substance Use Topics  . Smoking status: Current Every Day Smoker    Types: Cigarettes  . Smokeless tobacco: Never Used  . Alcohol use Yes     Comment: daily - "whatever I can get" - 1/5 plus daily     Allergies   Patient has no known allergies.   Review of Systems Review of Systems  Constitutional: Negative for activity change, chills, diaphoresis, fatigue and fever.  HENT: Negative for congestion and rhinorrhea.   Eyes: Negative for visual disturbance.  Respiratory: Negative for cough, chest tightness, shortness of breath, wheezing and stridor.   Cardiovascular: Negative for chest pain, palpitations and leg swelling.  Gastrointestinal: Negative for abdominal distention, abdominal pain, blood in stool, constipation, diarrhea, nausea and vomiting.  Genitourinary: Negative for difficulty urinating, dysuria and flank pain.  Musculoskeletal: Negative for back pain and gait problem.  Skin: Negative for rash and wound.  Neurological: Negative for dizziness, weakness, light-headedness, numbness and headaches.  Psychiatric/Behavioral: Negative for agitation.  All other systems reviewed and are negative.    Physical Exam Updated  Vital Signs BP 101/64 Comment: large cuff  Pulse 99   Temp 97.5 F (36.4 C)   Resp 18   SpO2 95%   Physical Exam  Constitutional: He is oriented to person, place, and time. He appears well-developed and well-nourished.  HENT:  Head: Normocephalic and atraumatic.  Mouth/Throat: Oropharynx is clear and moist. No oropharyngeal exudate.  Eyes: Conjunctivae are normal. Pupils are equal, round, and reactive to light.    Neck: Normal range of motion. Neck supple.  Cardiovascular: Normal rate and regular rhythm.   No murmur heard. Pulmonary/Chest: Effort normal and breath sounds normal. No stridor. No respiratory distress. He has no wheezes. He exhibits no tenderness.  Abdominal: Soft. There is no tenderness.  Musculoskeletal: He exhibits no edema or tenderness.  Neurological: He is alert and oriented to person, place, and time. No sensory deficit. He exhibits normal muscle tone. Coordination and gait normal.  Patient had normal gait bathroom. Patient has no focal neurologic deficits. Patient appears clinically sober as he is alert and oriented and answering questions appropriately.  Skin: Skin is warm and dry. Capillary refill takes less than 2 seconds. No erythema. No pallor.  Psychiatric: He has a normal mood and affect.  Nursing note and vitals reviewed.    ED Treatments / Results  Labs (all labs ordered are listed, but only abnormal results are displayed) Labs Reviewed - No data to display  EKG  EKG Interpretation None       Radiology No results found.  Procedures Procedures (including critical care time)  Medications Ordered in ED Medications  sodium chloride 0.9 % bolus 1,000 mL (0 mLs Intravenous Stopped 08/05/16 1306)     Initial Impression / Assessment and Plan / ED Course  I have reviewed the triage vital signs and the nursing notes.  Pertinent labs & imaging results that were available during my care of the patient were reviewed by me and considered in my medical decision making (see chart for details).     Jordan Davidson is a 46 y.o. male with a past medical history significant for bipolar disorder and alcohol abuse who presents with intoxication.  History and exam are seen above.  On exam, patient is lying down trying to sleep. Patient was easily arousable conversation. Patient noted to have a flask tucked into his belt. Patient's abdomen was nontender, lungs were  clear. And no focal neurologic deficits. Patient was visualized walking to the bathroom without gait abnormality. Exam otherwise unremarkable.  On repeat vital signs, patient had no significant abnormalities. Patient was tachycardic when he checked in hours prior to my evaluation which has resolved. Next  Suspect intoxication from his reported alcohol use tonight. And nausea with the patient's care plan, patient does not appear to have any life-threatening illness at this time. Patient is requesting that we check his alcohol level however, as patient is thinking clearly and does not appear altered collagen [place] as necessary, and [place] with his airplane. Do not feel patient has a traumatic injury so will not order imaging. Patient appears clinically sober as he is walking around.  Patient will be discharged to follow-up with a PCP and a resource guide was given for outpatient reevaluation facilities. Patient voiced displeasure about not being able to sleep on the floor but was informed that he cannot stay in the triage room. Patient discharged in good condition with improvement in his intoxication.  11:36 AM Nursing reports that shortly after walking out of the emergency department waiting room, patient  went to the embolus and laid down. They suspect that patient drank more alcohol shortly after leaving. They report that patient is again intoxicated. Patient will be placed back into exam room and reassessed. Patient will be given fluids and allowed to metabolize.   3:37 PM She was observed for several more hours. Patient had improvement in intoxication. Patient answers all questions appropriately. Patient was able to eat lunch sitting up and was able to walk to the bathroom without problem. Patient ambulatory for me without falling. I feel patient is clinically sober once again and is appropriate for discharge. Patient has no evidence of external trauma and has no other complains. Patient will be  discharged.  Pt was seen at bus stop with completely normal gait and appeared to be clinically sober. Pt discharged in good condition.     Final Clinical Impressions(s) / ED Diagnoses   Final diagnoses:  Alcoholic intoxication without complication (HCC)    New Prescriptions There are no discharge medications for this patient.   Clinical Impression: 1. Alcoholic intoxication without complication (HCC)     Disposition: Discharge  Condition: Good  I have discussed the results, Dx and Tx plan with the pt(& family if present). He/she/they expressed understanding and agree(s) with the plan. Discharge instructions discussed at great length. Strict return precautions discussed and pt &/or family have verbalized understanding of the instructions. No further questions at time of discharge.    There are no discharge medications for this patient.   Follow Up: Lavinia Sharps, NP [address] [place] [address] [phone]     [facility]-EMERGENCY DEPT [address] [phone] mc [address] [place] [address] [phone]  If symptoms worsen     Heide Scales, MD 08/06/16 [phone]

## 2016-08-05 NOTE — ED Triage Notes (Signed)
Pt does not see the need for repeating his blood pressure. He is cooperative and conversive. Pt is requesting a drink of water

## 2016-08-05 NOTE — ED Notes (Signed)
Pt ambulatory.

## 2016-08-05 NOTE — ED Triage Notes (Signed)
EDP at bedside. Pt was provided with resources for follow up with alcohol .

## 2016-08-05 NOTE — ED Triage Notes (Signed)
Security escorted pt to lobby. Pt ambulated without assist

## 2016-08-05 NOTE — ED Notes (Signed)
Pt opens eyes to verbal stimuli.

## 2016-08-05 NOTE — ED Notes (Signed)
Called pt for room 1x, no patients seen in lobby 2x

## 2016-08-05 NOTE — ED Triage Notes (Signed)
Pt found sleeping in triage restroom. Pt was here last night 2x requesting detox, but was not in lobby both times pt was called for a room assignment. Pt went to triage room then fell back asleep.

## 2016-08-05 NOTE — ED Notes (Signed)
Pt ambulates to restroom with stand by assist to void.

## 2016-08-05 NOTE — Discharge Instructions (Signed)
Please follow-up with your primary care physician for further management. Please stay hydrated. Please use the resources to find a substance abuse rehabilitation program.

## 2016-08-05 NOTE — ED Triage Notes (Signed)
Pt c/o "severe withdrawals" from alcohol; pt has minimal tremors and is ambulatory; denies severe headache; oriented to situation, self, and place

## 2016-08-09 ENCOUNTER — Encounter (HOSPITAL_COMMUNITY): Payer: Self-pay | Admitting: Emergency Medicine

## 2016-08-09 ENCOUNTER — Emergency Department (HOSPITAL_COMMUNITY)
Admission: EM | Admit: 2016-08-09 | Discharge: 2016-08-10 | Disposition: A | Discharge status: Home / Self Care | Payer: Self-pay | Attending: Dermatology | Admitting: Dermatology

## 2016-08-09 DIAGNOSIS — Z5321 Procedure and treatment not carried out due to patient leaving prior to being seen by health care provider: Secondary | ICD-10-CM | POA: Insufficient documentation

## 2016-08-09 DIAGNOSIS — F10120 Alcohol abuse with intoxication, uncomplicated: Secondary | ICD-10-CM | POA: Insufficient documentation

## 2016-08-09 NOTE — ED Notes (Signed)
Pt not in lobby.  

## 2016-08-09 NOTE — ED Notes (Signed)
Pt sleeping in lobby and refused to wake up to have vitals checked.

## 2016-08-09 NOTE — ED Triage Notes (Signed)
Per EMS-intoxicated-complaining of generalized abdominal pain all day-no vomiting-feel asleep on the way to ED-ambulatory

## 2016-08-10 NOTE — ED Notes (Signed)
Pt called multiple times, no responce

## 2016-08-13 ENCOUNTER — Encounter (HOSPITAL_COMMUNITY): Payer: Self-pay

## 2016-08-13 ENCOUNTER — Emergency Department (HOSPITAL_COMMUNITY)
Admission: EM | Admit: 2016-08-13 | Discharge: 2016-08-13 | Disposition: A | Discharge status: Home / Self Care | Payer: Self-pay | Attending: Emergency Medicine | Admitting: Emergency Medicine

## 2016-08-13 DIAGNOSIS — Y939 Activity, unspecified: Secondary | ICD-10-CM | POA: Insufficient documentation

## 2016-08-13 DIAGNOSIS — S61412A Laceration without foreign body of left hand, initial encounter: Secondary | ICD-10-CM | POA: Insufficient documentation

## 2016-08-13 DIAGNOSIS — F10920 Alcohol use, unspecified with intoxication, uncomplicated: Secondary | ICD-10-CM

## 2016-08-13 DIAGNOSIS — F10230 Alcohol dependence with withdrawal, uncomplicated: Secondary | ICD-10-CM | POA: Insufficient documentation

## 2016-08-13 DIAGNOSIS — F17210 Nicotine dependence, cigarettes, uncomplicated: Secondary | ICD-10-CM | POA: Insufficient documentation

## 2016-08-13 DIAGNOSIS — Z23 Encounter for immunization: Secondary | ICD-10-CM | POA: Insufficient documentation

## 2016-08-13 DIAGNOSIS — Y929 Unspecified place or not applicable: Secondary | ICD-10-CM | POA: Insufficient documentation

## 2016-08-13 DIAGNOSIS — X58XXXA Exposure to other specified factors, initial encounter: Secondary | ICD-10-CM | POA: Insufficient documentation

## 2016-08-13 DIAGNOSIS — Y999 Unspecified external cause status: Secondary | ICD-10-CM | POA: Insufficient documentation

## 2016-08-13 DIAGNOSIS — S60411A Abrasion of left index finger, initial encounter: Secondary | ICD-10-CM | POA: Insufficient documentation

## 2016-08-13 MED ORDER — TETANUS-DIPHTH-ACELL PERTUSSIS 5-2.5-18.5 LF-MCG/0.5 IM SUSP
0.5000 mL | Freq: Once | INTRAMUSCULAR | Status: AC
  Administered 2016-08-13: 0.5 mL via INTRAMUSCULAR
  Filled 2016-08-13: qty 0.5

## 2016-08-13 NOTE — ED Notes (Signed)
Pt found wondering around in halls unsteady gait noted helped pt back to bed and informed to call nurse if he needs to get up, no respiratory or acute distress noted alert and oriented call light in reach.

## 2016-08-13 NOTE — ED Notes (Signed)
Food and drink given per request no reaction to medication noted.

## 2016-08-13 NOTE — ED Triage Notes (Signed)
Left index finger with superficial laceration noted bleeding controlled doesn't know how or when he cut it. Wants detox. With etoh noted on arrival.

## 2016-08-13 NOTE — ED Notes (Signed)
No respiratory or acute distress noted resting in bed with eyes closed call light in reach. 

## 2016-08-13 NOTE — ED Provider Notes (Addendum)
[facility]-EMERGENCY DEPT Provider Note   CSN: [id number] Arrival date & time: 08/13/16  [id number]   By signing my name below, I, Clarisse GougeXavier Herndon, attest that this documentation has been prepared under the direction and in the presence of Tomasita CrumbleAdeleke Emrys Mceachron, MD. Electronically signed, Clarisse GougeXavier Herndon, ED Scribe. 08/13/16. 4:03 AM.   History   Chief Complaint Chief Complaint  Patient presents with  . Laceration   The history is provided by the patient and medical records. No language interpreter was used.    HPI Comments: Jordan Davidson is a 46 y.o. male who presents to the Emergency Department complaining of multiple left hand lacerations. He states he does not know when he sustained the lacerations. He adds current back pain and requests alcohol detox. ETOH noted on arrival per triage. Last tetanus unknown.  Past Medical History:  Diagnosis Date  . Alcohol abuse   . Alcoholism (HCC)   . Bipolar 1 disorder, depressed (HCC)   . Depression   . Thrombocytopenia [facility](HCC)     Patient Active Problem List   Diagnosis Date Noted  . Scalp laceration 05/01/2016  . Acute blood loss anemia 05/01/2016  . Thrombocytopenia (HCC) 05/01/2016  . Alcohol abuse 05/01/2016  . Blunt head trauma 04/28/2016  . Major depressive disorder, recurrent episode, moderate (HCC) 12/13/2015  . 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 Bipolar I disorder, most recent episode (or current) unspecified 02/14/2015  . Alcohol use disorder, severe, dependence (HCC) 02/13/2015    Past Surgical History:  Procedure Laterality Date  . NO PAST SURGERIES         Home Medications    Prior to Admission medications   Not on File    Family History Family History  Problem Relation Age of Onset  . Hypertension Mother   . Cancer Father   . Heart failure Father   . Alcoholism Father     Social History Social History  Substance Use Topics  . Smoking status: Current Every Day Smoker    Types: Cigarettes  .  Smokeless tobacco: Never Used  . Alcohol use Yes     Comment: daily - "whatever I can get" - 1/5 plus daily     Allergies   Patient has no known allergies.   Review of Systems Review of Systems  All other systems reviewed and are negative.  A complete 10 system review of systems was obtained and all systems are negative except as noted in the HPI and PMH.    Physical Exam Updated Vital Signs BP 140/92   Pulse 100   Resp 16   Ht 5\' 8"  (1.727 m)   Wt 190 lb (86.2 kg)   SpO2 97%   BMI 28.89 kg/m   Physical Exam  Constitutional: He is oriented to person, place, and time. Vital signs are normal. He appears well-developed and well-nourished.  Non-toxic appearance. He does not appear ill. No distress.  HENT:  Head: Normocephalic and atraumatic.  Nose: Nose normal.  Mouth/Throat: Oropharynx is clear and moist. No oropharyngeal exudate.  Eyes: Conjunctivae and EOM are normal. Pupils are equal, round, and reactive to light. No scleral icterus.  Neck: Normal range of motion. Neck supple. No tracheal deviation, no edema, no erythema and normal range of motion present. No thyroid mass and no thyromegaly present.  Cardiovascular: Normal rate, regular rhythm, S1 normal, S2 normal, normal heart sounds, intact distal pulses and normal pulses.  Exam reveals no gallop and no friction rub.   No murmur heard. Pulmonary/Chest: Effort  normal and breath sounds normal. No respiratory distress. He has no wheezes. He has no rhonchi. He has no rales.  Abdominal: Soft. Normal appearance and bowel sounds are normal. He exhibits no distension, no ascites and no mass. There is no hepatosplenomegaly. There is no tenderness. There is no rebound, no guarding and no CVA tenderness.  Musculoskeletal: Normal range of motion. He exhibits no edema or tenderness.  No midline tenderness through his back  Lymphadenopathy:    He has no cervical adenopathy.  Neurological: He is alert and oriented to person, place, and  time. He has normal strength. No cranial nerve deficit or sensory deficit.  Skin: Skin is warm and dry. Abrasion noted. No petechiae and no rash noted. He is not diaphoretic. No erythema. No pallor.  1 cm abrasion on the radial aspect of the second digit of the left hand  Nursing note and vitals reviewed.    ED Treatments / Results  DIAGNOSTIC STUDIES: Oxygen Saturation is 97% on RA, adequate by my interpretation.    COORDINATION OF CARE: 3:59 AM Discussed treatment plan with pt at bedside and pt agreed to plan. Will order referral and tetanus.  Labs (all labs ordered are listed, but only abnormal results are displayed) Labs Reviewed - No data to display  EKG  EKG Interpretation None       Radiology No results found.  Procedures Procedures (including critical care time)  Medications Ordered in ED Medications - No data to display   Initial Impression / Assessment and Plan / ED Course  I have reviewed the triage vital signs and the nursing notes.  Pertinent labs & imaging results that were available during my care of the patient were reviewed by me and considered in my medical decision making (see chart for details).     Patient presents to the ED for alcohol intoxication requesting detox.  He is not in acute withdrawal and was offered resources.  Tetanus was updated.  He is still too intoxicated to go home. WIll hold in the ED until clinically sober.     6:37 AM Patient seen ambulating around the ED to the bathroom on his own but with a very unsteady gait.  Will continue to obs until sober. Plan to sign out to oncoming provider    I personally performed the services described in this documentation, which was scribed in my presence. The recorded information has been reviewed and is accurate.     Final Clinical Impressions(s) / ED Diagnoses   Final diagnoses:  None    New Prescriptions New Prescriptions   No medications on file         Tomasita Crumble,  MD 08/13/16 [phone]

## 2016-08-13 NOTE — ED Notes (Signed)
No reaction to medication noted alert and oriented x 3 no respiratory or acute distress noted call light in reach. 

## 2016-08-13 NOTE — ED Notes (Signed)
Bed: [id number] Expected date:  Expected time:  Means of arrival:  Comments: 46 yo M/ Laceration-ETOH

## 2016-08-14 ENCOUNTER — Encounter (HOSPITAL_COMMUNITY): Payer: Self-pay | Admitting: Emergency Medicine

## 2016-08-14 ENCOUNTER — Emergency Department (HOSPITAL_COMMUNITY)
Admission: EM | Admit: 2016-08-14 | Discharge: 2016-08-14 | Discharge status: Against Medical Advice | Payer: Self-pay | Attending: Emergency Medicine | Admitting: Emergency Medicine

## 2016-08-14 DIAGNOSIS — F17210 Nicotine dependence, cigarettes, uncomplicated: Secondary | ICD-10-CM | POA: Insufficient documentation

## 2016-08-14 DIAGNOSIS — F10920 Alcohol use, unspecified with intoxication, uncomplicated: Secondary | ICD-10-CM

## 2016-08-14 DIAGNOSIS — Z5321 Procedure and treatment not carried out due to patient leaving prior to being seen by health care provider: Secondary | ICD-10-CM | POA: Insufficient documentation

## 2016-08-14 DIAGNOSIS — F10120 Alcohol abuse with intoxication, uncomplicated: Secondary | ICD-10-CM | POA: Insufficient documentation

## 2016-08-14 NOTE — ED Notes (Signed)
Per ED PA-allow patient to sleep.  Once patient is sober he will be discharged

## 2016-08-14 NOTE — ED Notes (Signed)
Pt resting comfortably with no complaints at this time.

## 2016-08-14 NOTE — ED Notes (Signed)
ED Provider at bedside. 

## 2016-08-14 NOTE — ED Notes (Signed)
Pt ambulatory to restroom

## 2016-08-14 NOTE — ED Notes (Signed)
Pt states that he has been an alcoholic for 30 years.  He states that maybe he needs a bus pass and something to eat.  Pt states that his longest period of sobriety came when he was incarcerated for 9 months.  Pt states that he has never been sober "on his own" in the past 30 years and outside of being in a treatment program and being sober for 2months until he messed up in a halfway house.  Pt requests meal and bus pass so that he can "go [place] to make enough for the next drink".  Pt reports that his stomach is not causing him any pain but rather needs something on it to settle it

## 2016-08-14 NOTE — ED Notes (Signed)
Pt left AMA and did not want to wait for discharge (spoke to [employer] operational officersecretary).  Ambulatory on his way out.

## 2016-08-14 NOTE — ED Triage Notes (Signed)
Per EMS-[facility]-called EMS for detox and a headache-using profanity when he found out he had to sit in lobby

## 2016-08-16 ENCOUNTER — Emergency Department (HOSPITAL_COMMUNITY)
Admission: EM | Admit: 2016-08-16 | Discharge: 2016-08-16 | Disposition: A | Discharge status: Home / Self Care | Payer: Self-pay | Attending: Emergency Medicine | Admitting: Emergency Medicine

## 2016-08-16 ENCOUNTER — Encounter (HOSPITAL_COMMUNITY): Payer: Self-pay

## 2016-08-16 DIAGNOSIS — F10920 Alcohol use, unspecified with intoxication, uncomplicated: Secondary | ICD-10-CM

## 2016-08-16 DIAGNOSIS — F17210 Nicotine dependence, cigarettes, uncomplicated: Secondary | ICD-10-CM | POA: Insufficient documentation

## 2016-08-16 DIAGNOSIS — F1010 Alcohol abuse, uncomplicated: Secondary | ICD-10-CM

## 2016-08-16 DIAGNOSIS — F10120 Alcohol abuse with intoxication, uncomplicated: Secondary | ICD-10-CM | POA: Insufficient documentation

## 2016-08-16 NOTE — Discharge Instructions (Signed)
STOP DRINKING ALCOHOL! Stay hydrated with WATER. Use the list below to find help for your substance abuse and alcoholism. Follow up with your primary care doctor in 3-5 days for ongoing medical care and management of your alcoholism. Return to the ER for emergent changes or worsening conditions

## 2016-08-16 NOTE — ED Notes (Signed)
Pt states he understands instructions.DC stable  With steady gait.

## 2016-08-16 NOTE — ED Notes (Signed)
Pt wakes and states he has ankle pain 8/10 x many years.

## 2016-08-16 NOTE — ED Triage Notes (Signed)
Pt arrives EMS with request for detox. ETOH noted. Seen at [facility] 2 days ago for same.

## 2016-08-16 NOTE — ED Notes (Addendum)
Pt becomes verbally abusive requesting that I "leave me the fuck alone.". Pt staggers toward the bathroom and uses wall to remain upright. Pt urged to return to stretcher. Pt again curses at staff. Security called. Pt redirected to stretcher and voids to urinal with assist to stand.

## 2016-08-16 NOTE — ED Provider Notes (Signed)
[facility]-EMERGENCY DEPT Provider Note   CSN: [id number] Arrival date & time: 08/16/16  1229     History   Chief Complaint Chief Complaint  Patient presents with  . Alcohol Problem    HPI Dimitrios Balestrieri is a 46 y.o. male.  Patient presents to the emergency department via EMS intoxicated with chief complaint of intoxication.  He is unable to contribute to his history due to intoxication. He is frequently seen for intoxication.  LEVEL 5 caveat applies.   The history is provided by the patient. No language interpreter was used.  Alcohol Problem     Past Medical History:  Diagnosis Date  . Alcohol abuse   . Alcoholism (HCC)   . Bipolar 1 disorder, depressed (HCC)   . Depression   . Thrombocytopenia [facility])     Patient Active Problem List   Diagnosis Date Noted  . Scalp laceration 05/01/2016  . Acute blood loss anemia 05/01/2016  . Thrombocytopenia (HCC) 05/01/2016  . Alcohol abuse 05/01/2016  . Blunt head trauma 04/28/2016  . Major depressive disorder, recurrent episode, moderate (HCC) 12/13/2015  . 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 Bipolar I disorder, most recent episode (or current) unspecified 02/14/2015  . Alcohol use disorder, severe, dependence (HCC) 02/13/2015    Past Surgical History:  Procedure Laterality Date  . NO PAST SURGERIES         Home Medications    Prior to Admission medications   Not on File    Family History Family History  Problem Relation Age of Onset  . Hypertension Mother   . Cancer Father   . Heart failure Father   . Alcoholism Father     Social History Social History  Substance Use Topics  . Smoking status: Current Every Day Smoker    Types: Cigarettes  . Smokeless tobacco: Never Used  . Alcohol use Yes     Comment: daily - "whatever I can get" - 1/5 plus daily     Allergies   Patient has no known allergies.   Review of Systems Review of Systems  Unable to perform ROS: Other (intoxication)      Physical Exam Updated Vital Signs BP 119/63 (BP Location: Right Arm)   Pulse 110   Temp 98.6 F (37 C) (Oral)   Resp 19   Ht 6' (1.829 m)   Wt 78.5 kg   SpO2 95%   BMI 23.46 kg/m   Physical Exam  Constitutional: He appears well-developed and well-nourished.  HENT:  Head: Normocephalic and atraumatic.  Eyes: Conjunctivae and EOM are normal. Pupils are equal, round, and reactive to light. Right eye exhibits no discharge. Left eye exhibits no discharge. No scleral icterus.  Neck: Normal range of motion. Neck supple. No JVD present.  Cardiovascular: Normal rate, regular rhythm and normal heart sounds.  Exam reveals no gallop and no friction rub.   No murmur heard. Pulmonary/Chest: Effort normal and breath sounds normal. No respiratory distress. He has no wheezes. He has no rales. He exhibits no tenderness.  Abdominal: Soft. He exhibits no distension and no mass. There is no tenderness. There is no rebound and no guarding.  Musculoskeletal: Normal range of motion. He exhibits no edema or tenderness.  Moves all extremities  Neurological:  Asleep, intoxicated  Skin: Skin is warm and dry.  Psychiatric: He has a normal mood and affect. His behavior is normal. Judgment and thought content normal.  Nursing note and vitals reviewed.    ED Treatments / Results  Labs (all labs ordered are listed, but only abnormal results are displayed) Labs Reviewed - No data to display  EKG  EKG Interpretation None       Radiology No results found.  Procedures Procedures (including critical care time)  Medications Ordered in ED Medications - No data to display   Initial Impression / Assessment and Plan / ED Course  I have reviewed the triage vital signs and the nursing notes.  Pertinent labs & imaging results that were available during my care of the patient were reviewed by me and considered in my medical decision making (see chart for details).     Patient clinically  intoxicated. No evidence of traumatic injuries. Patient moves all extremities. He was able to stand and urinate at the bedside. He is asleep in the stretcher now.  Reassess after clinically sober and discharge.  Patient signed out to oncoming team.  Final Clinical Impressions(s) / ED Diagnoses   Final diagnoses:  None    New Prescriptions New Prescriptions   No medications on file     Roxy HorsemanRobert Dianara Smullen, PA-C 08/16/16 1527    Canary Brimhristopher J Tegeler, MD 08/16/16 2012

## 2016-08-16 NOTE — ED Notes (Signed)
Pt continues sleeping with resp 16, non laboredc skin warm and dry.

## 2016-08-16 NOTE — ED Notes (Signed)
PA sees pt walking to BR and states he is appropriate to DC. Pt ambulates to BR with steady gait.

## 2016-08-16 NOTE — ED Notes (Signed)
Pt stood up on side of bed with one assist to use urinal. Pt very unsteady on feet.

## 2016-08-16 NOTE — ED Notes (Signed)
Left without signing 

## 2016-08-16 NOTE — ED Provider Notes (Signed)
Care assumed from De PereRob Browning, [place]-C, at shift change. Pt here with EtOH intoxication, pt well known to the ED and has a care plan in place. Awaiting pt to be clinically sober then likely d/c.    Physical Exam  BP 102/59 (BP Location: Right Arm)   Pulse 107   Temp 98.4 F (36.9 C) (Oral)   Resp 14   Ht 6' (1.829 m)   Wt 78.5 kg   SpO2 99%   BMI 23.46 kg/m    Physical Exam Gen: afebrile, VSS, NAD HEENT: EOMI, MMM Resp: no resp distress CV: mildly tachycardic, similar to prior visits Abd: appearance normal, nondistended MsK: moving all extremities with ease, ambulatory without difficulty Neuro: A&O x4, finger-to-nose coordination WNL  ED Course  Procedures Results for orders placed or performed during the hospital encounter of 05/10/16  CBG monitoring, ED  Result Value Ref Range   Glucose-Capillary 96 65 - 99 mg/dL   No results found.   No orders of the defined types were placed in this encounter.    MDM:   ICD-9-CM ICD-10-CM   1. Alcohol abuse 305.00 F10.10   2. Alcoholic intoxication without complication (HCC) 305.00 F10.920    6:43 PM Pt clinically sober and ambulatory without difficulty, no ongoing complaints, no focal neuro deficits. Will d/c home with list of shelters, outpatient resources for alcoholism, and discussed alcohol cessation and close PCP f/up for ongoing management of his medical care and his alcoholism. Discussed staying hydrated with water. I explained the diagnosis and have given explicit precautions to return to the ER including for any other new or worsening symptoms. The patient understands and accepts the medical plan as it's been dictated and I have answered their questions. Discharge instructions concerning home care and prescriptions have been given. The patient is STABLE and is discharged to home in good condition.     [address] Evely Gainey Emon Lance, PA-C 08/16/16 1843    Canary Brimhristopher J Tegeler, MD 08/16/16 2012

## 2016-08-16 NOTE — ED Notes (Addendum)
Pt wakes and states he wants to walk to BR when Told he could be DC if walking states he cant have that because he is sick and nauseated. Same reportesd to PA.Pt drinking water and tolerating well.

## 2016-08-16 NOTE — ED Notes (Signed)
Pt sleeping with non labored resp. 

## 2016-08-17 ENCOUNTER — Encounter (HOSPITAL_COMMUNITY): Payer: Self-pay | Admitting: Emergency Medicine

## 2016-08-17 ENCOUNTER — Emergency Department (HOSPITAL_COMMUNITY)
Admission: EM | Admit: 2016-08-17 | Discharge: 2016-08-17 | Disposition: A | Discharge status: Home / Self Care | Payer: Self-pay | Attending: Emergency Medicine | Admitting: Emergency Medicine

## 2016-08-17 DIAGNOSIS — F17210 Nicotine dependence, cigarettes, uncomplicated: Secondary | ICD-10-CM | POA: Insufficient documentation

## 2016-08-17 DIAGNOSIS — F10120 Alcohol abuse with intoxication, uncomplicated: Secondary | ICD-10-CM | POA: Insufficient documentation

## 2016-08-17 DIAGNOSIS — F10920 Alcohol use, unspecified with intoxication, uncomplicated: Secondary | ICD-10-CM

## 2016-08-17 NOTE — ED Provider Notes (Signed)
[facility]-EMERGENCY DEPT Provider Note   CSN: [id number] Arrival date & time: 08/17/16  0010  By signing my name below, I, Jordan Davidson, attest that this documentation has been prepared under the direction and in the presence of Jordan Sirois, MD. Electronically Signed: Diona Davidson, ED Scribe. 08/17/16. 1:21 AM.   History   Chief Complaint Chief Complaint  Patient presents with  . Alcohol Intoxication   LEVEL V CAVEAT: HPI and ROS limited due to ETOH intoxication.  HPI Jordan Davidson is a 46 y.o. male BIB EMS, with a PMHx of ETOH abuse, thrombocytopenia, bipolar disorder and depression presents to the Emergency Department due to ETOH intoxication. Per pt, he drank  an unspecified amount of beer, vodka, and Listerine prior to his arrival in the ED. Per prior chart review, pt has been seen in the ED for same eight times for same in the past two weeks, most recently yesterday. He denies SI/HI.    The history is provided by the patient. The history is limited by the condition of the patient. No language interpreter was used.  Alcohol Intoxication  This is a chronic problem. The problem occurs constantly. The problem has not changed since onset.Pertinent negatives include no chest pain. Nothing aggravates the symptoms. Nothing relieves the symptoms. He has tried nothing for the symptoms. The treatment provided no relief.   Past Medical History:  Diagnosis Date  . Alcohol abuse   . Alcoholism (HCC)   . Bipolar 1 disorder, depressed (HCC)   . Depression   . Thrombocytopenia [facility])     Patient Active Problem List   Diagnosis Date Noted  . Scalp laceration 05/01/2016  . Acute blood loss anemia 05/01/2016  . Thrombocytopenia (HCC) 05/01/2016  . Alcohol abuse 05/01/2016  . Blunt head trauma 04/28/2016  . Major depressive disorder, recurrent episode, moderate (HCC) 12/13/2015  . 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 Bipolar I disorder, most recent episode (or current)  unspecified 02/14/2015  . Alcohol use disorder, severe, dependence (HCC) 02/13/2015    Past Surgical History:  Procedure Laterality Date  . NO PAST SURGERIES         Home Medications    Prior to Admission medications   Not on File    Family History Family History  Problem Relation Age of Onset  . Hypertension Mother   . Cancer Father   . Heart failure Father   . Alcoholism Father     Social History Social History  Substance Use Topics  . Smoking status: Current Every Day Smoker    Types: Cigarettes  . Smokeless tobacco: Never Used  . Alcohol use Yes     Comment: daily - "whatever I can get" - 1/5 plus daily     Allergies   Patient has no known allergies.   Review of Systems Review of Systems  Unable to perform ROS: Other (ETOH intoxication)  Cardiovascular: Negative for chest pain.   Physical Exam Updated Vital Signs BP 144/98 (BP Location: Left Arm)   Pulse 110   Temp 97.8 F (36.6 C) (Oral)   Resp 18   SpO2 96%   Physical Exam  Constitutional: He appears well-developed and well-nourished.  HENT:  Head: Normocephalic.  Mouth/Throat: Oropharynx is clear and moist. No oropharyngeal exudate.  Eyes: Conjunctivae and EOM are normal. Pupils are equal, round, and reactive to light. Right eye exhibits no discharge. Left eye exhibits no discharge. No scleral icterus.  Neck: Normal range of motion. Neck supple. No JVD present. No tracheal  deviation present.  Trachea is midline. No stridor or carotid bruits. Intact phonation.  Cardiovascular: Normal rate, regular rhythm, normal heart sounds and intact distal pulses.   No murmur heard. Pulmonary/Chest: Effort normal and breath sounds normal. No stridor. No respiratory distress. He has no wheezes. He has no rales.  Lungs CTA bilaterally.  Abdominal: Soft. He exhibits no distension. Bowel sounds are increased. There is no tenderness. There is no rebound and no guarding.  Musculoskeletal: Normal range of motion.  He exhibits no edema or tenderness.  All compartments are soft. No palpable cords.   Lymphadenopathy:    He has no cervical adenopathy.  Neurological: He is alert. He has normal reflexes. He displays normal reflexes.  Skin: Skin is warm and dry. Capillary refill takes less than 2 seconds.  Psychiatric: He has a normal mood and affect. His behavior is normal.  Nursing note and vitals reviewed.    ED Treatments / Results  DIAGNOSTIC STUDIES: Oxygen Saturation is 96% on RA, adequate by my interpretation.   Results for orders placed or performed during the hospital encounter of 05/10/16  CBG monitoring, ED  Result Value Ref Range   Glucose-Capillary 96 65 - 99 mg/dL   No results found.   Procedures    Final Clinical Impressions(s) / ED Diagnoses  Alcohol intoxication: After history, exam, and medical workup I feel the patient has been appropriately medically screened and is safe for discharge home. Pertinent diagnoses were discussed with the patient. Patient was given return precautions such as exertional chest pain, DOE, shortness of breath, neck pain, dyspnea on exertion, intractable pain, intractable vomiting or any concerns.  Follow up with your PMD for recheck and ongoing care.  New Prescriptions New Prescriptions   No medications on file   I personally performed the services described in this documentation, which was scribed in my presence. The recorded information has been reviewed and is accurate.       Cy BlamerApril Chenille Toor, MD 08/17/16 [phone]

## 2016-08-17 NOTE — ED Notes (Addendum)
Patient eating breakfast. Patient ambulated without difficulty. Patient given bus pass. Patient ambulated out of the Emergency Department.

## 2016-08-17 NOTE — ED Triage Notes (Addendum)
Pt was picked up by EMS at [employer] for intoxication. Pt is requesting detox. Pt is ambulatory, but not steadily. Pt is calm and cooperative at this time. Pt is alert and oriented x 4. Pt is unable to state how much etoh he has consumed tonight. Pt is not tremulous nor is he diaphoretic. Pt denies SI and HI

## 2016-08-17 NOTE — ED Notes (Signed)
Discharge instructions reviewed with patient. Patient verbalized understanding. 

## 2016-08-19 ENCOUNTER — Encounter (HOSPITAL_COMMUNITY): Payer: Self-pay | Admitting: Emergency Medicine

## 2016-08-19 ENCOUNTER — Emergency Department (HOSPITAL_COMMUNITY)
Admission: EM | Admit: 2016-08-19 | Discharge: 2016-08-20 | Disposition: A | Discharge status: Home / Self Care | Payer: Self-pay | Attending: Emergency Medicine | Admitting: Emergency Medicine

## 2016-08-19 DIAGNOSIS — F10120 Alcohol abuse with intoxication, uncomplicated: Secondary | ICD-10-CM | POA: Insufficient documentation

## 2016-08-19 DIAGNOSIS — F10920 Alcohol use, unspecified with intoxication, uncomplicated: Secondary | ICD-10-CM

## 2016-08-19 DIAGNOSIS — R51 Headache: Secondary | ICD-10-CM | POA: Insufficient documentation

## 2016-08-19 DIAGNOSIS — F17210 Nicotine dependence, cigarettes, uncomplicated: Secondary | ICD-10-CM | POA: Insufficient documentation

## 2016-08-19 NOTE — ED Provider Notes (Signed)
[facility]-EMERGENCY DEPT Provider Note   CSN: [id number] Arrival date & time: 08/19/16  2254    By signing my name below, I, Valentino Saxon, attest that this documentation has been prepared under the direction and in the presence of Shon Baton, MD. Electronically Signed: Valentino Saxon, ED Scribe. 08/19/16. 11:50 PM.  History   Chief Complaint Chief Complaint  Patient presents with  . Alcohol Intoxication   The history is provided by the patient. No language interpreter was used.   LEVEL 5 CAVEAT: HPI and ROS limited due to alcohol intoxication.   HPI Comments: Jordan Davidson is a 46 y.o. male with PMHx of alcohol abuse, depression, bipolar 1 disorder brought in by ambulance who presents to the Emergency Department presenting with alcohol intoxication. Per EMS, pt was a bystander at [facility] who had fallen. Pt was reported found laying in the grass, smelling like alcohol, with dried blood around his nose due to a fall. Pt was last seen in the ED for similar reasons. He has been seen eight times for the same reason in the past two weeks. Pt notes he had some alcohol consumption today but denies any use of illicit dugs. He states he drinks alcohol on a daily basis. Pt reports associated mild headache. No alleviating factors noted.   Level V caveat for alcohol intoxication.  Past Medical History:  Diagnosis Date  . Alcohol abuse   . Alcoholism (HCC)   . Bipolar 1 disorder, depressed (HCC)   . Depression   . Thrombocytopenia [facility])     Patient Active Problem List   Diagnosis Date Noted  . Scalp laceration 05/01/2016  . Acute blood loss anemia 05/01/2016  . Thrombocytopenia (HCC) 05/01/2016  . Alcohol abuse 05/01/2016  . Blunt head trauma 04/28/2016  . Major depressive disorder, recurrent episode, moderate (HCC) 12/13/2015  . 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 Bipolar I disorder, most recent episode (or current) unspecified 02/14/2015  . Alcohol use  disorder, severe, dependence (HCC) 02/13/2015    Past Surgical History:  Procedure Laterality Date  . NO PAST SURGERIES         Home Medications    Prior to Admission medications   Not on File    Family History Family History  Problem Relation Age of Onset  . Hypertension Mother   . Cancer Father   . Heart failure Father   . Alcoholism Father     Social History Social History  Substance Use Topics  . Smoking status: Current Every Day Smoker    Types: Cigarettes  . Smokeless tobacco: Never Used  . Alcohol use Yes     Comment: daily - "whatever I can get" - 1/5 plus daily     Allergies   Patient has no known allergies.   Review of Systems Review of Systems  Unable to perform ROS: Other (alcohol intoxication)  Neurological: Positive for headaches.  All other systems reviewed and are negative.    Physical Exam Updated Vital Signs BP 116/85 (BP Location: Right Arm)   Pulse 94   Temp 98.6 F (37 C) (Oral)   Resp 16   SpO2 93%   Physical Exam  Constitutional: He is oriented to person, place, and time. He appears well-developed and well-nourished.  Somnolent but arousable, ABC's intact  HENT:  Head: Normocephalic.  Dried blood noted over the nose and nostrils  Eyes: Pupils are equal, round, and reactive to light.  Neck: Normal range of motion. Neck supple.  Cardiovascular: Normal rate,  regular rhythm and normal heart sounds.   No murmur heard. Pulmonary/Chest: Effort normal and breath sounds normal. No respiratory distress. He has no wheezes.  Abdominal: Soft. There is no tenderness.  Musculoskeletal: He exhibits no edema.  Neurological: He is alert and oriented to person, place, and time.  Somnolent but arousable, oriented 3, follows simple commands  Skin: Skin is warm and dry.  Psychiatric: He has a normal mood and affect.  Nursing note and vitals reviewed.    ED Treatments / Results   DIAGNOSTIC STUDIES: Oxygen Saturation is 93% on RA, low  by my interpretation.    COORDINATION OF CARE: 11:26 PM Discussed treatment plan with pt at bedside and pt agreed to plan.   Labs (all labs ordered are listed, but only abnormal results are displayed) Labs Reviewed - No data to display  EKG  EKG Interpretation None       Radiology No results found.  Procedures Procedures (including critical care time)  Medications Ordered in ED Medications - No data to display   Initial Impression / Assessment and Plan / ED Course  I have reviewed the triage vital signs and the nursing notes.  Pertinent labs & imaging results that were available during my care of the patient were reviewed by me and considered in my medical decision making (see chart for details).     Patient presents with alcohol intoxication and reported fall. Fairly noncontributory to history taking. He does have dried blood over his face. Given that he cannot give history and is intoxicated, CT scan was obtained. This is reassuring. Patient was allowed to metabolize. He will be reevaluated by oncoming physician for discharge when he sobers up.  Final Clinical Impressions(s) / ED Diagnoses   Final diagnoses:  Alcoholic intoxication without complication (HCC)    New Prescriptions New Prescriptions   No medications on file   I personally performed the services described in this documentation, which was scribed in my presence. The recorded information has been reviewed and is accurate.     Shon Batonourtney F Tiearra Colwell, MD 08/21/16 [phone]

## 2016-08-19 NOTE — ED Notes (Signed)
[facility] Expected date:  Expected time:  Means of arrival:  Comments: 46 yo M/ ETOH

## 2016-08-19 NOTE — ED Triage Notes (Signed)
[facility] EMS[facility] "[facility]". Pt was found lying in the grass, smelling of ETOH.  Pt has dried blood on his nose d/t a fall.  Pt has no other complaints and is oriented x 4, speaking in full sentences.

## 2016-08-20 ENCOUNTER — Emergency Department (HOSPITAL_COMMUNITY)
Admission: EM | Admit: 2016-08-20 | Discharge: 2016-08-21 | Disposition: A | Discharge status: Home / Self Care | Payer: Self-pay | Attending: Emergency Medicine | Admitting: Emergency Medicine

## 2016-08-20 ENCOUNTER — Encounter (HOSPITAL_COMMUNITY): Payer: Self-pay | Admitting: Pharmacy Technician

## 2016-08-20 ENCOUNTER — Emergency Department (HOSPITAL_COMMUNITY): Payer: Self-pay

## 2016-08-20 DIAGNOSIS — F10229 Alcohol dependence with intoxication, unspecified: Secondary | ICD-10-CM | POA: Insufficient documentation

## 2016-08-20 DIAGNOSIS — Y939 Activity, unspecified: Secondary | ICD-10-CM | POA: Insufficient documentation

## 2016-08-20 DIAGNOSIS — F10920 Alcohol use, unspecified with intoxication, uncomplicated: Secondary | ICD-10-CM

## 2016-08-20 DIAGNOSIS — S0081XA Abrasion of other part of head, initial encounter: Secondary | ICD-10-CM | POA: Insufficient documentation

## 2016-08-20 DIAGNOSIS — X58XXXA Exposure to other specified factors, initial encounter: Secondary | ICD-10-CM | POA: Insufficient documentation

## 2016-08-20 DIAGNOSIS — Y929 Unspecified place or not applicable: Secondary | ICD-10-CM | POA: Insufficient documentation

## 2016-08-20 DIAGNOSIS — Z79899 Other long term (current) drug therapy: Secondary | ICD-10-CM | POA: Insufficient documentation

## 2016-08-20 DIAGNOSIS — Y999 Unspecified external cause status: Secondary | ICD-10-CM | POA: Insufficient documentation

## 2016-08-20 LAB — RAPID URINE DRUG SCREEN, HOSP PERFORMED
Amphetamines: NOT DETECTED
Barbiturates: NOT DETECTED
Benzodiazepines: NOT DETECTED
COCAINE: NOT DETECTED
OPIATES: NOT DETECTED
TETRAHYDROCANNABINOL: NOT DETECTED

## 2016-08-20 LAB — COMPREHENSIVE METABOLIC PANEL
ALBUMIN: 4.5 g/dL (ref 3.5–5.0)
ALT: 57 U/L (ref 17–63)
AST: 167 U/L — AB (ref 15–41)
Alkaline Phosphatase: 82 U/L (ref 38–126)
Anion gap: 16 — ABNORMAL HIGH (ref 5–15)
CHLORIDE: 100 mmol/L — AB (ref 101–111)
CO2: 27 mmol/L (ref 22–32)
Calcium: 9 mg/dL (ref 8.9–10.3)
Creatinine, Ser: 0.68 mg/dL (ref 0.61–1.24)
GFR calc Af Amer: >60 mL/min (ref >60)
Glucose, Bld: 105 mg/dL — ABNORMAL HIGH (ref 65–99)
POTASSIUM: 3.5 mmol/L (ref 3.5–5.1)
SODIUM: 143 mmol/L (ref 135–145)
Total Bilirubin: 0.5 mg/dL (ref 0.3–1.2)
Total Protein: 7.5 g/dL (ref 6.5–8.1)

## 2016-08-20 LAB — CBC WITH DIFFERENTIAL/PLATELET
BASOS ABS: 0 10*3/uL (ref 0.0–0.1)
BASOS PCT: 1 %
EOS ABS: 0 10*3/uL (ref 0.0–0.7)
EOS PCT: 1 %
HCT: 43.3 % (ref 39.0–52.0)
Hemoglobin: 14.9 g/dL (ref 13.0–17.0)
Lymphocytes Relative: 36 %
Lymphs Abs: 1.8 10*3/uL (ref 0.7–4.0)
MCH: 30 pg (ref 26.0–34.0)
MCHC: 34.4 g/dL (ref 30.0–36.0)
MCV: 87.1 fL (ref 78.0–100.0)
MONO ABS: 0.5 10*3/uL (ref 0.1–1.0)
Monocytes Relative: 10 %
Neutro Abs: 2.6 10*3/uL (ref 1.7–7.7)
Neutrophils Relative %: 53 %
PLATELETS: 33 10*3/uL — AB (ref 150–400)
RBC: 4.97 MIL/uL (ref 4.22–5.81)
RDW: 16.3 % — AB (ref 11.5–15.5)
WBC: 4.9 10*3/uL (ref 4.0–10.5)

## 2016-08-20 LAB — SALICYLATE LEVEL: Salicylate Lvl: <7 mg/dL (ref 2.8–30.0)

## 2016-08-20 LAB — ETHANOL: ALCOHOL ETHYL (B): 500 mg/dL — AB (ref <5)

## 2016-08-20 LAB — ACETAMINOPHEN LEVEL: Acetaminophen (Tylenol), Serum: <10 ug/mL — ABNORMAL LOW (ref 10–30)

## 2016-08-20 LAB — CBG MONITORING, ED: Glucose-Capillary: 111 mg/dL — ABNORMAL HIGH (ref 65–99)

## 2016-08-20 MED ORDER — SODIUM CHLORIDE 0.9 % IV BOLUS (SEPSIS)
1000.0000 mL | Freq: Once | INTRAVENOUS | Status: AC
  Administered 2016-08-20: 1000 mL via INTRAVENOUS

## 2016-08-20 NOTE — ED Notes (Signed)
Pt removed IV. Pt refuses to stay on cardiac monitoring. Will continue to assess.

## 2016-08-20 NOTE — ED Notes (Signed)
Resting in bed with eyes closed no respiratory or acute distress noted.

## 2016-08-20 NOTE — ED Notes (Signed)
No respiratory or acute distress noted resting in bed with eyes closed. 

## 2016-08-20 NOTE — ED Triage Notes (Signed)
Pt brought in via ambulance with reports of drinking approx of Listerine original. Pt states started drinking around 1800. Pt alert, appears intoxicated. Slurred speech. VSS.

## 2016-08-20 NOTE — ED Notes (Signed)
Patient is resting comfortably. 

## 2016-08-20 NOTE — ED Notes (Signed)
Critical ETOH 500 per lab. PA-C Manus RuddSchulz made aware.

## 2016-08-20 NOTE — ED Notes (Signed)
No respiratory or acute distress noted alert and talking but confused at times.

## 2016-08-21 ENCOUNTER — Emergency Department (HOSPITAL_COMMUNITY)
Admission: EM | Admit: 2016-08-21 | Discharge: 2016-08-21 | Disposition: A | Discharge status: Home / Self Care | Payer: Self-pay | Attending: Emergency Medicine | Admitting: Emergency Medicine

## 2016-08-21 ENCOUNTER — Encounter (HOSPITAL_COMMUNITY): Payer: Self-pay | Admitting: *Deleted

## 2016-08-21 DIAGNOSIS — R079 Chest pain, unspecified: Secondary | ICD-10-CM | POA: Insufficient documentation

## 2016-08-21 DIAGNOSIS — Z5321 Procedure and treatment not carried out due to patient leaving prior to being seen by health care provider: Secondary | ICD-10-CM | POA: Insufficient documentation

## 2016-08-21 DIAGNOSIS — R109 Unspecified abdominal pain: Secondary | ICD-10-CM | POA: Insufficient documentation

## 2016-08-21 DIAGNOSIS — F17210 Nicotine dependence, cigarettes, uncomplicated: Secondary | ICD-10-CM | POA: Insufficient documentation

## 2016-08-21 LAB — ETHANOL: Alcohol, Ethyl (B): 288 mg/dL — ABNORMAL HIGH (ref <5)

## 2016-08-21 MED ORDER — LORAZEPAM 1 MG PO TABS
1.0000 mg | ORAL_TABLET | Freq: Once | ORAL | Status: AC
  Administered 2016-08-21: 1 mg via ORAL
  Filled 2016-08-21: qty 1

## 2016-08-21 NOTE — ED Notes (Addendum)
Pt HR 126; EDP made aware. Ativan ordered.

## 2016-08-21 NOTE — ED Notes (Signed)
Pt given turkey sandwich

## 2016-08-21 NOTE — ED Provider Notes (Signed)
[facility]-EMERGENCY DEPT Provider Note   CSN: [id number] Arrival date & time: 08/20/16  1932     History   Chief Complaint Chief Complaint  Patient presents with  . Alcohol Intoxication    HPI Jordan Davidson is a 46 y.o. male.  This a 46 year old male who [facility] after being found in a [employer] He does history of alcohol abuse.  He denies any drug use He states he was drinking Listerine because it was free denies SI or HI.      No past medical history on file.  There are no active problems to display for this patient.   No past surgical history on file.     Home Medications    Prior to Admission medications   Not on File    Family History No family history on file.  Social History Social History  Substance Use Topics  . Smoking status: Not on file  . Smokeless tobacco: Not on file  . Alcohol use Yes     Allergies   Patient has no known allergies.   Review of Systems Review of Systems  Constitutional: Negative for fever.  HENT: Negative for congestion.   Respiratory: Negative for cough and shortness of breath.   Gastrointestinal: Negative for abdominal pain and vomiting.  Musculoskeletal: Negative for neck pain.  Skin: Positive for wound.  Neurological: Negative for dizziness and headaches.  All other systems reviewed and are negative.    Physical Exam Updated Vital Signs BP 131/81   Pulse (!) 126   Temp 97.6 F (36.4 C) (Oral)   Resp 18   SpO2 96%   Physical Exam  Constitutional: He appears well-developed and well-nourished. No distress.  HENT:  Head: Normocephalic.    Eyes: Pupils are equal, round, and reactive to light.  Neck: Normal range of motion.  Cardiovascular: Normal rate.   Pulmonary/Chest: Effort normal.  Abdominal: Soft. There is no tenderness.  Musculoskeletal: Normal range of motion.  Neurological: He is alert.  Skin: Skin is warm.  Psychiatric: His speech is slurred. He is slowed. He  expresses inappropriate judgment.  Nursing note and vitals reviewed.    ED Treatments / Results  Labs (all labs ordered are listed, but only abnormal results are displayed) Labs Reviewed  CBC WITH DIFFERENTIAL/PLATELET - Abnormal; Notable for the following:       Result Value   RDW 16.3 (*)    Platelets 33 (*)    All other components within normal limits  COMPREHENSIVE METABOLIC PANEL - Abnormal; Notable for the following:    Chloride 100 (*)    Glucose, Bld 105 (*)    BUN <5 (*)    AST 167 (*)    Anion gap 16 (*)    All other components within normal limits  ETHANOL - Abnormal; Notable for the following:    Alcohol, Ethyl (B) 500 (*)    All other components within normal limits  ACETAMINOPHEN LEVEL - Abnormal; Notable for the following:    Acetaminophen (Tylenol), Serum <10 (*)    All other components within normal limits  CBG MONITORING, ED - Abnormal; Notable for the following:    Glucose-Capillary 111 (*)    All other components within normal limits  SALICYLATE LEVEL  RAPID URINE DRUG SCREEN, HOSP PERFORMED    EKG  EKG Interpretation  Date/Time:  Sunday August 20 2016 19:51:13 EST Ventricular Rate:  78 PR Interval:    QRS Duration: 96 QT Interval:  401 QTC  Calculation: 457 R Axis:   84 Text Interpretation:  Sinus rhythm Biatrial enlargement ST elev, probable normal early repol pattern Baseline wander in lead(s) V5 No old tracing to compare Confirmed by [facility]  MD, ELLIOTT [phone]) on 08/20/2016 9:09:22 PM       Radiology No results found.  Procedures Procedures (including critical care time)  Medications Ordered in ED Medications  sodium chloride 0.9 % bolus 1,000 mL (0 mLs Intravenous Stopped 08/20/16 2216)  sodium chloride 0.9 % bolus 1,000 mL (0 mLs Intravenous Stopped 08/20/16 2342)  LORazepam (ATIVAN) tablet 1 mg (1 mg Oral Given 08/21/16 0042)     Initial Impression / Assessment and Plan / ED Course  I have reviewed the triage vital signs and the  nursing notes.  Pertinent labs & imaging results that were available during my care of the patient were reviewed by me and considered in my medical decision making (see chart for details).    Patient was reassessed approximately 2 hours after arrival, he is alert, oriented, wakes to gentle shaking. Patient will be monitored allowed to sober up    Final Clinical Impressions(s) / ED Diagnoses   Final diagnoses:  None    New Prescriptions New Prescriptions   No medications on file     Earley Favor, NP 08/21/16 0100    Mancel Bale, MD 08/21/16 1513

## 2016-08-21 NOTE — ED Notes (Signed)
Pt sitting up, eating and drinking.  A&O.

## 2016-08-21 NOTE — ED Notes (Signed)
Pt able to ambulate in hallway. 

## 2016-08-21 NOTE — ED Notes (Signed)
Pt ambulatory in room w/ steady gait. Pt given coke. Pt verbalized understanding of d/c instructions. Pt is now getting dressed

## 2016-08-21 NOTE — ED Notes (Signed)
Called pt's name to obtain vitals, no one answered. 

## 2016-08-21 NOTE — ED Provider Notes (Signed)
6[facility]26 [facility]. Has been up in department. Pending EtOH recheck.   BP 121/79   Pulse 88   Temp 97.6 F (36.4 C) (Oral)   Resp 16   SpO2 95%   7:37 AM Patient sleeping. He is rousable. Will ambulate and d/c home.    Renne CriglerJoshua Kylia Grajales, PA-C 08/21/16 [id number]    Zadie Rhineonald Wickline, MD 08/22/16 Marlyne Beards0002

## 2016-08-21 NOTE — ED Notes (Signed)
Called pt's name three times to obtain vital signs, no one answered. 

## 2016-08-21 NOTE — ED Triage Notes (Addendum)
Pt reports intermittent abdominal and chest pain. Pt denies any at this time. Pt states "i just want to be done with all this and lay down. I didn't want to come here".Pt reports having 8 24oz beers today.

## 2016-08-21 NOTE — ED Provider Notes (Signed)
Notified by Nursing staff tha patient is becoming restless Will order Ativan despite elevated ETOH level as this patient has consistently high ETOH levels and concern for withdrawal.   Earley FavorGail Shakeela Rabadan, NP 08/21/16 0050    Mancel BaleElliott Wentz, MD 08/21/16 [phone]

## 2016-08-21 NOTE — ED Notes (Signed)
Pt refuses to ambulate. Says he feels "dizzy"

## 2016-08-21 NOTE — Discharge Instructions (Signed)
Please read and follow all provided instructions.  Your diagnoses today include:  1. Alcoholic intoxication without complication (HCC)     Tests performed today include:  Blood counts and electrolytes  Alcohol level - 6 times the legal limit  Vital signs. See below for your results today.   Medications prescribed:   None  Take any prescribed medications only as directed.  Home care instructions:  Follow any educational materials contained in this packet.  BE VERY CAREFUL not to take multiple medicines containing Tylenol (also called acetaminophen). Doing so can lead to an overdose which can damage your liver and cause liver failure and possibly death.   Follow-up instructions: Please follow-up with your primary care provider in the next 3 days for further evaluation of your symptoms.   Return instructions:   Please return to the Emergency Department if you experience worsening symptoms.   Please return if you have any other emergent concerns.  Additional Information:  Your vital signs today were: BP 121/79    Pulse 88    Temp 97.6 F (36.4 C) (Oral)    Resp 16    SpO2 95%  If your blood pressure (BP) was elevated above 135/85 this visit, please have this repeated by your doctor within one month. --------------

## 2016-08-22 ENCOUNTER — Encounter (HOSPITAL_COMMUNITY): Payer: Self-pay | Admitting: *Deleted

## 2016-08-31 ENCOUNTER — Emergency Department (HOSPITAL_COMMUNITY)
Admission: EM | Admit: 2016-08-31 | Discharge: 2016-08-31 | Disposition: A | Discharge status: Home / Self Care | Payer: Self-pay | Attending: Emergency Medicine | Admitting: Emergency Medicine

## 2016-08-31 DIAGNOSIS — F17210 Nicotine dependence, cigarettes, uncomplicated: Secondary | ICD-10-CM | POA: Insufficient documentation

## 2016-08-31 DIAGNOSIS — F1010 Alcohol abuse, uncomplicated: Secondary | ICD-10-CM

## 2016-08-31 DIAGNOSIS — F10120 Alcohol abuse with intoxication, uncomplicated: Secondary | ICD-10-CM | POA: Insufficient documentation

## 2016-08-31 NOTE — ED Provider Notes (Signed)
[facility]-EMERGENCY DEPT Provider Note    By signing my name below, I, Earmon PhoenixJennifer Waddell, attest that this documentation has been prepared under the direction and in the presence of Raeford RazorStephen Paije Goodhart, MD. Electronically Signed: Earmon PhoenixJennifer Waddell, ED Scribe. 08/31/16. 9:03 PM.    History   Chief Complaint Chief Complaint  Patient presents with  . Alcohol Intoxication    The history is provided by the patient and medical records. No language interpreter was used.    HPI Comments:  Jordan Davidson is a homeless 46 y.o. male with PMHx of alcohol abuse and bipolar disorder, brought in by EMS, who presents to the Emergency Department intoxicated. He states he wants to use the bathroom, eat and sleep. He denies any complaints. Sleeping prior to exam.   Past Medical History:  Diagnosis Date  . Alcohol abuse   . Alcoholism (HCC)   . Bipolar 1 disorder, depressed (HCC)   . Depression   . Thrombocytopenia [facility](HCC)     Patient Active Problem List   Diagnosis Date Noted  . Scalp laceration 05/01/2016  . Acute blood loss anemia 05/01/2016  . Thrombocytopenia (HCC) 05/01/2016  . Alcohol abuse 05/01/2016  . Blunt head trauma 04/28/2016  . Major depressive disorder, recurrent episode, moderate (HCC) 12/13/2015  . 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 Bipolar I disorder, most recent episode (or current) unspecified 02/14/2015  . Alcohol use disorder, severe, dependence (HCC) 02/13/2015    Past Surgical History:  Procedure Laterality Date  . NO PAST SURGERIES         Home Medications    Prior to Admission medications   Not on File    Family History Family History  Problem Relation Age of Onset  . Hypertension Mother   . Cancer Father   . Heart failure Father   . Alcoholism Father     Social History Social History  Substance Use Topics  . Smoking status: Current Every Day Smoker    Types: Cigarettes  . Smokeless tobacco: Never Used  . Alcohol use Yes   Comment: daily - "whatever I can get" - 1/5 plus daily     Allergies   Patient has no known allergies.   Review of Systems Review of Systems A complete 10 system review of systems was obtained and all systems are negative except as noted in the HPI and PMH.    Physical Exam Updated Vital Signs BP 127/70 (BP Location: Left Arm)   Pulse 78   Temp 97.7 F (36.5 C) (Oral)   Resp 20   SpO2 100%   Physical Exam  Constitutional: He is oriented to person, place, and time. He appears well-developed and well-nourished.  Disheveled appearance. No acute distress. Drowsy but opens eyes to voice.  HENT:  Head: Normocephalic.  Eyes: EOM are normal.  Neck: Normal range of motion.  Cardiovascular: Normal rate, regular rhythm and normal heart sounds.  Exam reveals no gallop and no friction rub.   No murmur heard. Pulmonary/Chest: Effort normal and breath sounds normal. No respiratory distress. He has no wheezes. He has no rales.  Abdominal: He exhibits no distension.  Musculoskeletal: Normal range of motion.  No external signs of acute trauma. Moves all extremities.   Neurological: He is alert and oriented to person, place, and time.  Psychiatric: He has a normal mood and affect.  Nursing note and vitals reviewed.    ED Treatments / Results  DIAGNOSTIC STUDIES: Oxygen Saturation is 100% on RA, normal by my interpretation.  COORDINATION OF CARE: 9:02 PM- Will discharge patient. Pt verbalizes understanding and agrees to plan.  Medications - No data to display  Labs (all labs ordered are listed, but only abnormal results are displayed) Labs Reviewed - No data to display  EKG  EKG Interpretation None       Radiology No results found.  Procedures Procedures (including critical care time)  Medications Ordered in ED Medications - No data to display   Initial Impression / Assessment and Plan / ED Course  I have reviewed the triage vital signs and the nursing  notes.  Pertinent labs & imaging results that were available during my care of the patient were reviewed by me and considered in my medical decision making (see chart for details).     Final Clinical Impressions(s) / ED Diagnoses   Final diagnoses:  ETOH abuse    New Prescriptions New Prescriptions   No medications on file     Raeford Razor, MD 09/12/16 1340

## 2016-08-31 NOTE — ED Notes (Signed)
Bed: [facility] Expected date:  Expected time:  Means of arrival:  Comments: 

## 2016-08-31 NOTE — ED Notes (Signed)
Pt for the second time crawled on to the floor from the chair and stated he wanted to let there. EMT 1st and triage nurse helped pt back into the chair and reminded him that he cannot lay in the floor.

## 2016-08-31 NOTE — ED Notes (Signed)
Pt sitting in a wheelchair in the lobby got up and the sat down in the floor

## 2016-09-12 ENCOUNTER — Emergency Department (HOSPITAL_COMMUNITY): Payer: Self-pay

## 2016-09-12 ENCOUNTER — Emergency Department (HOSPITAL_COMMUNITY)
Admission: EM | Admit: 2016-09-12 | Discharge: 2016-09-12 | Disposition: A | Discharge status: Home / Self Care | Payer: Self-pay | Attending: Emergency Medicine | Admitting: Emergency Medicine

## 2016-09-12 ENCOUNTER — Emergency Department (HOSPITAL_COMMUNITY): Admission: EM | Admit: 2016-09-12 | Discharge: 2016-09-12 | Discharge status: Against Medical Advice | Payer: Self-pay

## 2016-09-12 ENCOUNTER — Encounter (HOSPITAL_COMMUNITY): Payer: Self-pay | Admitting: *Deleted

## 2016-09-12 DIAGNOSIS — M79641 Pain in right hand: Secondary | ICD-10-CM | POA: Insufficient documentation

## 2016-09-12 DIAGNOSIS — F10120 Alcohol abuse with intoxication, uncomplicated: Secondary | ICD-10-CM | POA: Insufficient documentation

## 2016-09-12 DIAGNOSIS — F17210 Nicotine dependence, cigarettes, uncomplicated: Secondary | ICD-10-CM | POA: Insufficient documentation

## 2016-09-12 DIAGNOSIS — F10920 Alcohol use, unspecified with intoxication, uncomplicated: Secondary | ICD-10-CM

## 2016-09-12 DIAGNOSIS — Y939 Activity, unspecified: Secondary | ICD-10-CM | POA: Insufficient documentation

## 2016-09-12 DIAGNOSIS — Y999 Unspecified external cause status: Secondary | ICD-10-CM | POA: Insufficient documentation

## 2016-09-12 DIAGNOSIS — Y929 Unspecified place or not applicable: Secondary | ICD-10-CM | POA: Insufficient documentation

## 2016-09-12 DIAGNOSIS — W19XXXA Unspecified fall, initial encounter: Secondary | ICD-10-CM | POA: Insufficient documentation

## 2016-09-12 LAB — CBG MONITORING, ED: GLUCOSE-CAPILLARY: 113 mg/dL — AB (ref 65–99)
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
## 2016-09-12 NOTE — ED Notes (Signed)
Patient refusing to get up and walk at this time. PA made aware.

## 2016-09-12 NOTE — ED Notes (Signed)
ED Provider at bedside. 

## 2016-09-12 NOTE — ED Notes (Signed)
No answer when called 

## 2016-09-12 NOTE — ED Notes (Signed)
Patient ambulatory in hallway with minimal assistance. Patient given meal tray and orange juice.

## 2016-09-12 NOTE — ED Notes (Signed)
Bed: WHALD Expected date:  Expected time:  Means of arrival:  Comments: 

## 2016-09-12 NOTE — ED Triage Notes (Signed)
Pt was seen leaving lobby while EMS gave report.

## 2016-09-12 NOTE — ED Triage Notes (Signed)
Per EMS, pt's sponsor found pt on [address] with slurred speech and 3/4 empty Listerine bottle. Pt's sponsor called [phone]. Pt sleeping upon arrival to ED, vitals stable, CBG 109, HR 82, Spo2 94%.

## 2016-09-12 NOTE — Discharge Instructions (Signed)
You have been seen today for alcohol intoxication. Please follow up with your PCP as needed. Refer to the attached document for information on outpatient recovery resources.

## 2016-09-23 ENCOUNTER — Emergency Department (HOSPITAL_COMMUNITY)
Admission: EM | Admit: 2016-09-23 | Discharge: 2016-09-23 | Disposition: A | Discharge status: Home / Self Care | Payer: Self-pay | Attending: Emergency Medicine | Admitting: Emergency Medicine

## 2016-09-23 ENCOUNTER — Encounter (HOSPITAL_COMMUNITY): Payer: Self-pay | Admitting: Vascular Surgery

## 2016-09-23 ENCOUNTER — Emergency Department (HOSPITAL_COMMUNITY): Payer: Self-pay

## 2016-09-23 DIAGNOSIS — W19XXXA Unspecified fall, initial encounter: Secondary | ICD-10-CM | POA: Insufficient documentation

## 2016-09-23 DIAGNOSIS — F10920 Alcohol use, unspecified with intoxication, uncomplicated: Secondary | ICD-10-CM

## 2016-09-23 DIAGNOSIS — F10129 Alcohol abuse with intoxication, unspecified: Secondary | ICD-10-CM | POA: Insufficient documentation

## 2016-09-23 DIAGNOSIS — Y999 Unspecified external cause status: Secondary | ICD-10-CM | POA: Insufficient documentation

## 2016-09-23 DIAGNOSIS — M79641 Pain in right hand: Secondary | ICD-10-CM | POA: Insufficient documentation

## 2016-09-23 DIAGNOSIS — F17210 Nicotine dependence, cigarettes, uncomplicated: Secondary | ICD-10-CM | POA: Insufficient documentation

## 2016-09-23 DIAGNOSIS — Y929 Unspecified place or not applicable: Secondary | ICD-10-CM | POA: Insufficient documentation

## 2016-09-23 DIAGNOSIS — Y939 Activity, unspecified: Secondary | ICD-10-CM | POA: Insufficient documentation

## 2016-09-23 NOTE — ED Provider Notes (Signed)
[facility]-EMERGENCY DEPT Provider Note   CSN: [id number] Arrival date & time: 09/23/16  2025     History   Chief Complaint Chief Complaint  Patient presents with  . Hand Pain  . Alcohol Intoxication    HPI Jordan Davidson is a 46 y.o. male.  HPI Pt presents to the ED with complaints of hand pain.  Pt fell about a week ago.  He is complaining of persistent pain on his right hand.  Pt also admits to drinking alcohol.   He is an Brewing technologist and drinks regularly. He denies SI or HI.  He is hungry, he states he has not eaten for the last couple of days.  He would like a sandwich.   Patient states he needs some help in order to quit drinking alcohol  Past Medical History:  Diagnosis Date  . Alcohol abuse   . Alcoholism (HCC)   . Bipolar 1 disorder, depressed (HCC)   . Depression   . Thrombocytopenia [facility])     Patient Active Problem List   Diagnosis Date Noted  . Scalp laceration 05/01/2016  . Acute blood loss anemia 05/01/2016  . Thrombocytopenia (HCC) 05/01/2016  . Alcohol abuse 05/01/2016  . Blunt head trauma 04/28/2016  . Major depressive disorder, recurrent episode, moderate (HCC) 12/13/2015  . 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 Bipolar I disorder, most recent episode (or current) unspecified 02/14/2015  . Alcohol use disorder, severe, dependence (HCC) 02/13/2015    Past Surgical History:  Procedure Laterality Date  . NO PAST SURGERIES         Home Medications    Prior to Admission medications   Not on File    Family History Family History  Problem Relation Age of Onset  . Hypertension Mother   . Cancer Father   . Heart failure Father   . Alcoholism Father     Social History Social History  Substance Use Topics  . Smoking status: Current Every Day Smoker    Types: Cigarettes  . Smokeless tobacco: Never Used  . Alcohol use Yes     Comment: daily - "whatever I can get" - 1/5 plus daily     Allergies   Patient has no known  allergies.   Review of Systems Review of Systems  All other systems reviewed and are negative.    Physical Exam Updated Vital Signs BP (!) 128/97 (BP Location: Left Arm)   Pulse 100   Temp 97.4 F (36.3 C) (Oral)   Resp 16   Ht 6' (1.829 m)   Wt 81.6 kg   SpO2 94%   BMI 24.41 kg/m   Physical Exam  Constitutional: He appears well-developed and well-nourished. No distress.  HENT:  Head: Normocephalic and atraumatic.  Right Ear: External ear normal.  Left Ear: External ear normal.  Eyes: Conjunctivae are normal. Right eye exhibits no discharge. Left eye exhibits no discharge. No scleral icterus.  Neck: Neck supple. No tracheal deviation present.  Cardiovascular: Normal rate, regular rhythm and intact distal pulses.   Pulmonary/Chest: Effort normal and breath sounds normal. No stridor. No respiratory distress. He has no wheezes. He has no rales.  Abdominal: Soft. Bowel sounds are normal. He exhibits no distension. There is no tenderness. There is no rebound and no guarding.  Musculoskeletal: He exhibits tenderness (right hand). He exhibits no edema.  Neurological: He is alert. He has normal strength. No cranial nerve deficit (no facial droop, extraocular movements intact, no slurred speech) or sensory deficit. He exhibits  normal muscle tone. He displays no seizure activity. Coordination normal.  Skin: Skin is warm and dry. No rash noted.  Psychiatric: He has a normal mood and affect.  Nursing note and vitals reviewed.    ED Treatments / Results  Labs (all labs ordered are listed, but only abnormal results are displayed) Labs Reviewed - No data to display  EKG  EKG Interpretation None       Radiology Dg Hand Complete Right  Result Date: 09/23/2016 CLINICAL DATA:  Initial evaluation for acute trauma, fall. EXAM: RIGHT HAND - COMPLETE 3+ VIEW COMPARISON:  None. FINDINGS: No acute fracture dislocation. Joint spaces well maintained. Mild irregularity at the midshaft of  the right second metacarpal likely related to remotely healed trauma. Osseous mineralization normal. No soft tissue abnormality. IMPRESSION: No acute osseous abnormality about the right hand. Electronically Signed   By: Rise Mu M.D.   On: 09/23/2016 21:16    Procedures Procedures (including critical care time)  Medications Ordered in ED Medications - No data to display   Initial Impression / Assessment and Plan / ED Course  I have reviewed the triage vital signs and the nursing notes.  Pertinent labs & imaging results that were available during my care of the patient were reviewed by me and considered in my medical decision making (see chart for details).   patient has history of alcoholism. He has been seen numerous times in the emergency department in the past for similar issues.  Patient denies any SI or HI. He has been evaluated many times in the past for his alcohol abuse.  Patient does not require inpatient hospitalization. I will give him outpatient referrals. We will monitor here until the patient can safely be discharged  Patient's hand injury not significant. There is no evidence of infection or fracture.  Pt ended up leaving before he was formally released and before he was given any paperwork  Final Clinical Impressions(s) / ED Diagnoses   Final diagnoses:  Alcoholic intoxication without complication (HCC)  Right hand pain    New Prescriptions New Prescriptions   No medications on file     Linwood Dibbles, MD 09/24/16 1723

## 2016-09-23 NOTE — Discharge Instructions (Signed)
Follow-up with a primary care doctor. °

## 2016-09-23 NOTE — ED Triage Notes (Signed)
Pt reports to the ED for eval of right hand pain. He states he fell on it a week ago and think he may have broken his hand. He also states that he is intoxicated and he wants something to eat. Pt states that he is interested in getting detox. He does have a hx of seizures from withdrawal. Last drink was today (wine/liquor).

## 2016-09-24 ENCOUNTER — Emergency Department (HOSPITAL_COMMUNITY)
Admission: EM | Admit: 2016-09-24 | Discharge: 2016-09-25 | Disposition: A | Discharge status: Home / Self Care | Payer: Self-pay | Attending: Dermatology | Admitting: Dermatology

## 2016-09-24 ENCOUNTER — Encounter (HOSPITAL_COMMUNITY): Payer: Self-pay

## 2016-09-24 DIAGNOSIS — F17210 Nicotine dependence, cigarettes, uncomplicated: Secondary | ICD-10-CM | POA: Insufficient documentation

## 2016-09-24 DIAGNOSIS — Z5321 Procedure and treatment not carried out due to patient leaving prior to being seen by health care provider: Secondary | ICD-10-CM | POA: Insufficient documentation

## 2016-09-24 DIAGNOSIS — F10129 Alcohol abuse with intoxication, unspecified: Secondary | ICD-10-CM | POA: Insufficient documentation

## 2016-09-24 NOTE — ED Triage Notes (Signed)
Pt brought in by [employer]. Pt found lying in yard. Pt states ETOH tonight, unsure how much. Pt slurring words, difficulty standing/walking.

## 2016-09-24 NOTE — ED Notes (Signed)
Pt stated he did not want to get checked out and stated that he is leaving.

## 2016-09-25 ENCOUNTER — Encounter (HOSPITAL_COMMUNITY): Payer: Self-pay | Admitting: Emergency Medicine

## 2016-09-25 ENCOUNTER — Emergency Department (HOSPITAL_COMMUNITY)
Admission: EM | Admit: 2016-09-25 | Discharge: 2016-09-25 | Disposition: A | Discharge status: Home / Self Care | Payer: Self-pay | Attending: Emergency Medicine | Admitting: Emergency Medicine

## 2016-09-25 ENCOUNTER — Emergency Department (HOSPITAL_COMMUNITY): Payer: Self-pay

## 2016-09-25 DIAGNOSIS — F10920 Alcohol use, unspecified with intoxication, uncomplicated: Secondary | ICD-10-CM

## 2016-09-25 DIAGNOSIS — W228XXA Striking against or struck by other objects, initial encounter: Secondary | ICD-10-CM | POA: Insufficient documentation

## 2016-09-25 DIAGNOSIS — F10120 Alcohol abuse with intoxication, uncomplicated: Secondary | ICD-10-CM | POA: Insufficient documentation

## 2016-09-25 DIAGNOSIS — F17210 Nicotine dependence, cigarettes, uncomplicated: Secondary | ICD-10-CM | POA: Insufficient documentation

## 2016-09-25 DIAGNOSIS — Y939 Activity, unspecified: Secondary | ICD-10-CM | POA: Insufficient documentation

## 2016-09-25 DIAGNOSIS — S0081XA Abrasion of other part of head, initial encounter: Secondary | ICD-10-CM | POA: Insufficient documentation

## 2016-09-25 DIAGNOSIS — S0990XA Unspecified injury of head, initial encounter: Secondary | ICD-10-CM

## 2016-09-25 DIAGNOSIS — Y999 Unspecified external cause status: Secondary | ICD-10-CM | POA: Insufficient documentation

## 2016-09-25 DIAGNOSIS — Y929 Unspecified place or not applicable: Secondary | ICD-10-CM | POA: Insufficient documentation

## 2016-09-25 LAB — ETHANOL: Alcohol, Ethyl (B): 241 mg/dL — ABNORMAL HIGH (ref <5)

## 2016-09-25 NOTE — ED Notes (Signed)
ASKED TO GET VITALS AND HE REFUSED.

## 2016-09-25 NOTE — ED Notes (Signed)
Pt ambulatory to restroom with independent steady gait, NAD. D/c instructions and follow up care reviewed with patient. Pt stated he did not want his discharge paperwork. Pt given one bus pass.

## 2016-09-25 NOTE — ED Notes (Signed)
Bed: [facility] Expected date:  Expected time:  Means of arrival:  Comments: 

## 2016-09-25 NOTE — ED Notes (Signed)
Pt refusing vital signs

## 2016-09-25 NOTE — Discharge Instructions (Signed)
B follow-up as needed stopped drinking alcohol

## 2016-09-25 NOTE — ED Notes (Signed)
Pt taken to CT at this time.

## 2016-09-25 NOTE — ED Notes (Signed)
Pt ambulated to and from restroom with independent steady gait. Pt now sitting up on the side of the bed eating breakfast.

## 2016-09-25 NOTE — ED Notes (Signed)
ASKED PATIENT TO TRY TO AMBULATE PER DOCTORS ORDERS AND HE REFUSED.

## 2016-09-25 NOTE — ED Triage Notes (Signed)
Patient brought in by [employer]. Patient is intoxicated. Patient hit his head on the pavement. Patient has abrasion on right and left side of face. Patient can not walk without falling.

## 2016-09-25 NOTE — ED Provider Notes (Signed)
[facility]-EMERGENCY DEPT Provider Note   CSN: [id number] Arrival date & time: 09/25/16  0247   By signing my name below, I, [name], attest that this documentation has been prepared under the direction and in the presence of [name], MD. Electronically signed, [name], ED Scribe. 09/25/16. 3:52 AM.  History   Chief Complaint Chief Complaint  Patient presents with  . Alcohol Intoxication   LEVEL 5 CAVEAT D/T INTOXICATION  The history is provided by medical records. The history is limited by the condition of the patient. No language interpreter was used.  Alcohol Intoxication     HPI Comments: Jordan Davidson is a 46 y.o. male BIB [employer] who presents to the Emergency Department for alcohol intoxication. Pt reportedly fell forward on concrete and hit his face on impact. Abrasion reported to the pt's L eyebrow. Triage states the pt can not walk without falling d/t intoxication.  Past Medical History:  Diagnosis Date  . Alcohol abuse   . Alcoholism (HCC)   . Bipolar 1 disorder, depressed (HCC)   . Depression   . Thrombocytopenia [facility])     Patient Active Problem List   Diagnosis Date Noted  . Scalp laceration 05/01/2016  . Acute blood loss anemia 05/01/2016  . Thrombocytopenia (HCC) 05/01/2016  . Alcohol abuse 05/01/2016  . Blunt head trauma 04/28/2016  . Major depressive disorder, recurrent episode, moderate (HCC) 12/13/2015  . 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 Bipolar I disorder, most recent episode (or current) unspecified 02/14/2015  . Alcohol use disorder, severe, dependence (HCC) 02/13/2015    Past Surgical History:  Procedure Laterality Date  . NO PAST SURGERIES         Home Medications    Prior to Admission medications   Not on File    Family History Family History  Problem Relation Age of Onset  . Hypertension Mother   . Cancer Father   . Heart failure Father   . Alcoholism Father     Social History Social  History  Substance Use Topics  . Smoking status: Current Every Day Smoker    Types: Cigarettes  . Smokeless tobacco: Never Used  . Alcohol use Yes     Comment: daily - "whatever I can get" - 1/5 plus daily     Allergies   Patient has no known allergies.   Review of Systems Review of Systems  Unable to perform ROS: Other (intoxication)     Physical Exam Updated Vital Signs BP (!) 121/93 (BP Location: Left Arm)   Pulse (!) 102   Temp 97.9 F (36.6 C) (Oral)   Resp 18   Ht 6' (1.829 m)   Wt 180 lb (81.6 kg)   SpO2 95%   BMI 24.41 kg/m   Physical Exam CONSTITUTIONAL: disheveled, pt sleeping HEAD: abrasion to L eyebrow, no crepitus or other signs of trauma to the head EYES: PERRL, no proptosis ENMT: Mucous membranes moist, no obvious facial/nasal trauma NECK: supple no meningeal signs ABDOMEN: soft NEURO: Pt asleep, clearly intoxicated but arousable SKIN: warm, color normal PSYCH: unable to assess d/t intoxication  ED Treatments / Results  DIAGNOSTIC STUDIES: Oxygen Saturation is 95% on RA, normal by my interpretation.    Labs (all labs ordered are listed, but only abnormal results are displayed) Labs Reviewed - No data to display  EKG  EKG Interpretation None       Radiology  Procedures Procedures (including critical care time)  Medications Ordered in ED Medications - No data  to display   Initial Impression / Assessment and Plan / ED Course  I have reviewed the triage vital signs and the nursing notes.      5:22 AM Pt sleeping but arousable He wakes up enough to refuse to walk He abrasions to head but defer imaging as my suspicion for acute brain injury is low, suspect somnolence due to ETOH 6:28 AM Pt stable He is resting comfortably He is arousable, but still has slurred speech likely due to ETOH Will need to rest longer then another ambulation trial No signs of chest/abdominal/extremity trauma 7:02 AM At signout to Dr Estell Harpin, reassess  patient, ensure he can ambulate then discharge home  Final Clinical Impressions(s) / ED Diagnoses   Final diagnoses:  Alcoholic intoxication without complication (HCC)  Minor head injury, initial encounter    New Prescriptions New Prescriptions   No medications on file   I personally performed the services described in this documentation, which was scribed in my presence. The recorded information has been reviewed and is accurate.        [name], MD 09/25/16 [phone]

## 2016-09-25 NOTE — ED Notes (Signed)
Breakfast tray delivered for patient

## 2016-10-03 ENCOUNTER — Emergency Department (HOSPITAL_COMMUNITY)
Admission: EM | Admit: 2016-10-03 | Discharge: 2016-10-03 | Disposition: A | Discharge status: Home / Self Care | Payer: Self-pay | Attending: Emergency Medicine | Admitting: Emergency Medicine

## 2016-10-03 ENCOUNTER — Encounter (HOSPITAL_COMMUNITY): Payer: Self-pay | Admitting: Emergency Medicine

## 2016-10-03 DIAGNOSIS — F17210 Nicotine dependence, cigarettes, uncomplicated: Secondary | ICD-10-CM | POA: Insufficient documentation

## 2016-10-03 DIAGNOSIS — F1010 Alcohol abuse, uncomplicated: Secondary | ICD-10-CM | POA: Insufficient documentation

## 2016-10-03 MED ORDER — LORAZEPAM 1 MG PO TABS: 0.0000 mg | ORAL_TABLET | Freq: Four times a day (QID) | ORAL | Status: DC

## 2016-10-03 MED ORDER — LORAZEPAM 1 MG PO TABS: 0.0000 mg | ORAL_TABLET | Freq: Two times a day (BID) | ORAL | Status: DC

## 2016-10-03 NOTE — ED Notes (Signed)
Patient cursing staff-asked to speak with AC-Joe at bedside and patient cursing him. Security called and patient escorted off property.

## 2016-10-03 NOTE — ED Notes (Signed)
Called Pt for vital recheck. Pt not in lobby. No response.

## 2016-10-03 NOTE — ED Triage Notes (Signed)
Per [facility] patient comes from [place] for fall and [employer] bought patient a taco.  Patient has been drinking Listerine bc he doesn't have any money to by ETOH.  Patient requesting detox from ETOH because of not having any money to buy ETOH.

## 2016-10-03 NOTE — ED Provider Notes (Signed)
[facility]-EMER[facility]Y DEPT Provider Note   CSN: [id number] Arrival date & time: 10/03/16  1825    History   Chief Complaint Chief Complaint  Patient presents with  . Alcohol Intoxication    HPI Jordan Davidson is a 46 y.o. male.  46 year old male, well-known to the emergency department, with a history of alcohol abuse and bipolar 1 disorder presents today for intoxication requesting detox. Patient states, "I know you don't do that here, but may be you can get me to a place that does". He reports that his last drink was 15 minutes ago. He has been drinking Listerine today because he cannot afford to buy regular alcohol. Patient denies any complaints. He is calm and cooperative. Last seen on 09/25/16 for intoxication.   The history is provided by the patient. No language interpreter was used.  Alcohol Intoxication     Past Medical History:  Diagnosis Date  . Alcohol abuse   . Alcoholism (HCC)   . Bipolar 1 disorder, depressed (HCC)   . Depression   . Thrombocytopenia [facility])     Patient Active Problem List   Diagnosis Date Noted  . Scalp laceration 05/01/2016  . Acute blood loss anemia 05/01/2016  . Thrombocytopenia (HCC) 05/01/2016  . Alcohol abuse 05/01/2016  . Blunt head trauma 04/28/2016  . Major depressive disorder, recurrent episode, moderate (HCC) 12/13/2015  . 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 Bipolar I disorder, most recent episode (or current) unspecified 02/14/2015  . Alcohol use disorder, severe, dependence (HCC) 02/13/2015    Past Surgical History:  Procedure Laterality Date  . NO PAST SURGERIES         Home Medications    Prior to Admission medications   Medication Sig Start Date End Date Taking? Authorizing Provider  amphetamine-dextroamphetamine (ADDERALL) 20 MG tablet Take 20 mg by mouth 2 (two) times daily.   Yes Historical Provider, MD    Family History Family History  Problem Relation Age of Onset  . Hypertension Mother   .  Cancer Father   . Heart failure Father   . Alcoholism Father     Social History Social History  Substance Use Topics  . Smoking status: Current Every Day Smoker    Types: Cigarettes  . Smokeless tobacco: Never Used  . Alcohol use Yes     Comment: daily - "whatever I can get" - 1/5 plus daily     Allergies   Patient has no known allergies.   Review of Systems Review of Systems Ten systems reviewed and are negative for acute change, except as noted in the HPI.    Physical Exam Updated Vital Signs BP (!) 108/55 (BP Location: Left Arm)   Pulse (!) 102   Temp 98.9 F (37.2 C) (Oral)   Resp 18   Ht 6' (1.829 m)   Wt 74.4 kg   SpO2 94%   BMI 22.24 kg/m   Physical Exam  Constitutional: He is oriented to person, place, and time. He appears well-developed and well-nourished. No distress.  Nontoxic and in NAD  HENT:  Head: Normocephalic and atraumatic.  Eyes: Conjunctivae and EOM are normal. No scleral icterus.  Neck: Normal range of motion.  Cardiovascular: Regular rhythm and intact distal pulses.   Tachycardia  Pulmonary/Chest: Effort normal. No respiratory distress.  Respirations even and unlabored  Musculoskeletal: Normal range of motion.  Neurological: He is alert and oriented to person, place, and time. He exhibits normal muscle tone. Coordination normal.  GCS 15. Speech is goal  oriented. Patient moving all extremities. He is ambulatory with steady gait.  Skin: Skin is warm and dry. No rash noted. He is not diaphoretic. No erythema. No pallor.  No evidence of outward acute injury.  Psychiatric: He has a normal mood and affect. His behavior is normal.  Nursing note and vitals reviewed.    ED Treatments / Results  Labs (all labs ordered are listed, but only abnormal results are displayed) Labs Reviewed - No data to display  EKG  EKG Interpretation None       Radiology No results found.  Procedures Procedures (including critical care  time)  Medications Ordered in ED Medications  LORazepam (ATIVAN) tablet 0-4 mg (not administered)    Followed by  LORazepam (ATIVAN) tablet 0-4 mg (not administered)     Initial Impression / Assessment and Plan / ED Course  I have reviewed the triage vital signs and the nursing notes.  Pertinent labs & imaging results that were available during my care of the patient were reviewed by me and considered in my medical decision making (see chart for details).     46 year old male with history of alcohol abuse presents to the emergency department via [employer], intoxicated. He reports drinking Listerine today because he cannot afford alcohol. He is requesting detox. The patient has been made aware that their facilities outside of the hospital that can assist with detox. Patient began cursing at staff. He was escorted off the property prior to receiving a resource guide.   Final Clinical Impressions(s) / ED Diagnoses   Final diagnoses:  ETOH abuse    New Prescriptions Discharge Medication List as of 10/03/2016 11:11 PM       Antony Madura, PA-C 10/03/16 2329    Linwood Dibbles, MD 10/04/16 2103

## 2016-10-04 ENCOUNTER — Encounter (HOSPITAL_COMMUNITY): Payer: Self-pay | Admitting: *Deleted

## 2016-10-04 DIAGNOSIS — Z5321 Procedure and treatment not carried out due to patient leaving prior to being seen by health care provider: Secondary | ICD-10-CM | POA: Insufficient documentation

## 2016-10-04 DIAGNOSIS — F17210 Nicotine dependence, cigarettes, uncomplicated: Secondary | ICD-10-CM | POA: Insufficient documentation

## 2016-10-04 DIAGNOSIS — F10120 Alcohol abuse with intoxication, uncomplicated: Secondary | ICD-10-CM | POA: Insufficient documentation

## 2016-10-04 DIAGNOSIS — Z79899 Other long term (current) drug therapy: Secondary | ICD-10-CM | POA: Insufficient documentation

## 2016-10-04 NOTE — ED Triage Notes (Signed)
[place], [employer] evaluated pt and sent here for ETOH intoxication. VSS en route per [facility]

## 2016-10-05 ENCOUNTER — Emergency Department (HOSPITAL_COMMUNITY)
Admission: EM | Admit: 2016-10-05 | Discharge: 2016-10-05 | Disposition: A | Discharge status: Home / Self Care | Payer: Self-pay | Attending: Dermatology | Admitting: Dermatology

## 2016-10-05 NOTE — ED Notes (Signed)
Pt sleeping comfortably with non labored breathing and equal chest expansion

## 2016-10-05 NOTE — ED Notes (Addendum)
PT became upset when this writer turn light on to update vitals. PT request for something to drink state he have been drinking alcohol all day and need a cola. Info PT MD will be in shortly and determine if PT can have water

## 2016-10-05 NOTE — ED Notes (Signed)
Bed: [facility] Expected date:  Expected time:  Means of arrival:  Comments: 

## 2016-10-05 NOTE — ED Notes (Signed)
Pt sleeping comfortably.

## 2016-10-05 NOTE — ED Notes (Signed)
Patient verbalized "I do not have any medical concerns, I just needed to sleep it off. I feel ok now that you gave me a snack." Denies the need to see provider. VSS. Denies pain.

## 2016-10-06 ENCOUNTER — Emergency Department (HOSPITAL_COMMUNITY)
Admission: EM | Admit: 2016-10-06 | Discharge: 2016-10-06 | Discharge status: Against Medical Advice | Payer: Self-pay | Attending: Emergency Medicine | Admitting: Emergency Medicine

## 2016-10-06 ENCOUNTER — Encounter (HOSPITAL_COMMUNITY): Payer: Self-pay | Admitting: Emergency Medicine

## 2016-10-06 DIAGNOSIS — F10920 Alcohol use, unspecified with intoxication, uncomplicated: Secondary | ICD-10-CM

## 2016-10-06 DIAGNOSIS — F17210 Nicotine dependence, cigarettes, uncomplicated: Secondary | ICD-10-CM | POA: Insufficient documentation

## 2016-10-06 DIAGNOSIS — F10120 Alcohol abuse with intoxication, uncomplicated: Secondary | ICD-10-CM | POA: Insufficient documentation

## 2016-10-06 NOTE — ED Notes (Signed)
Pt ambulated well to the bathroom--- pt's gait and balance steady. 

## 2016-10-06 NOTE — ED Notes (Signed)
Pt was given sprite and ham sandwich.

## 2016-10-06 NOTE — ED Triage Notes (Addendum)
Per EMS pt called for a meal related to hunger; pt ETOH on board related to drinking half of bottle of mouthwash. Pt unsteady gait. Given sandwich and juice with triage.

## 2016-10-06 NOTE — ED Notes (Signed)
Upon re-checking pt's vitals pt told staff to leave him alone and just let him sleep because he is still drunk. Offered the pt a sandwich and something to drink, pt stated he doesn't want the sandwich, he wants a hot tray instead.

## 2016-10-07 ENCOUNTER — Emergency Department (HOSPITAL_COMMUNITY)
Admission: EM | Admit: 2016-10-07 | Discharge: 2016-10-07 | Disposition: A | Discharge status: Home / Self Care | Payer: Self-pay | Attending: Emergency Medicine | Admitting: Emergency Medicine

## 2016-10-07 ENCOUNTER — Encounter (HOSPITAL_COMMUNITY): Payer: Self-pay

## 2016-10-07 ENCOUNTER — Encounter (HOSPITAL_COMMUNITY): Payer: Self-pay | Admitting: *Deleted

## 2016-10-07 DIAGNOSIS — F10120 Alcohol abuse with intoxication, uncomplicated: Secondary | ICD-10-CM | POA: Insufficient documentation

## 2016-10-07 DIAGNOSIS — F1010 Alcohol abuse, uncomplicated: Secondary | ICD-10-CM

## 2016-10-07 DIAGNOSIS — F10920 Alcohol use, unspecified with intoxication, uncomplicated: Secondary | ICD-10-CM

## 2016-10-07 DIAGNOSIS — Z79899 Other long term (current) drug therapy: Secondary | ICD-10-CM | POA: Insufficient documentation

## 2016-10-07 DIAGNOSIS — F17210 Nicotine dependence, cigarettes, uncomplicated: Secondary | ICD-10-CM | POA: Insufficient documentation

## 2016-10-07 NOTE — ED Triage Notes (Signed)
Pt arrives via EMS with request for detox. Pt just left the ED this afternoon.

## 2016-10-07 NOTE — ED Notes (Signed)
Bed: WHALD Expected date:  Expected time:  Means of arrival:  Comments: 

## 2016-10-07 NOTE — ED Notes (Signed)
Bed: [facility] Expected date:  Expected time:  Means of arrival:  Comments: 45m ETOH detox

## 2016-10-07 NOTE — ED Provider Notes (Signed)
[facility]ER[facility] DEPT Provider Note   CSN: [id number] Arrival date & time: 10/07/16  1254     History   Chief Complaint Chief Complaint  Patient presents with  . Alcohol Problem    HPI Jordan Davidson is a 46 y.o. male.  Patient is a 46 year old male with history of alcohol abuse and bipolar disorder who comes in complaining of local poisoning. He states he thinks he drink too much alcohol and feels sick. He feels like he just needs to lay down for a while. He denies any other specific complaints. No vomiting. No fevers.      Past Medical History:  Diagnosis Date  . Alcohol abuse   . Alcoholism (HCC)   . Bipolar 1 disorder, depressed (HCC)   . Depression   . Thrombocytopenia [facility])     Patient Active Problem List   Diagnosis Date Noted  . Scalp laceration 05/01/2016  . Acute blood loss anemia 05/01/2016  . Thrombocytopenia (HCC) 05/01/2016  . Alcohol abuse 05/01/2016  . Blunt head trauma 04/28/2016  . Major depressive disorder, recurrent episode, moderate (HCC) 12/13/2015  . 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 Bipolar I disorder, most recent episode (or current) unspecified 02/14/2015  . Alcohol use disorder, severe, dependence (HCC) 02/13/2015    Past Surgical History:  Procedure Laterality Date  . NO PAST SURGERIES         Home Medications    Prior to Admission medications   Medication Sig Start Date End Date Taking? Authorizing Provider  amphetamine-dextroamphetamine (ADDERALL) 20 MG tablet Take 20 mg by mouth 2 (two) times daily.    Historical Provider, MD    Family History Family History  Problem Relation Age of Onset  . Hypertension Mother   . Cancer Father   . Heart failure Father   . Alcoholism Father     Social History Social History  Substance Use Topics  . Smoking status: Current Every Day Smoker    Types: Cigarettes  . Smokeless tobacco: Never Used  . Alcohol use Yes     Comment: daily - "whatever I can get" - 1/5  plus daily     Allergies   Patient has no known allergies.   Review of Systems Review of Systems  Constitutional: Positive for fatigue. Negative for chills, diaphoresis and fever.  HENT: Negative for congestion, rhinorrhea and sneezing.   Eyes: Negative.   Respiratory: Negative for cough, chest tightness and shortness of breath.   Cardiovascular: Negative for chest pain and leg swelling.  Gastrointestinal: Negative for abdominal pain, blood in stool, diarrhea, nausea and vomiting.  Genitourinary: Negative for difficulty urinating, flank pain, frequency and hematuria.  Musculoskeletal: Negative for arthralgias and back pain.  Skin: Negative for rash.  Neurological: Negative for dizziness, speech difficulty, weakness, numbness and headaches.     Physical Exam Updated Vital Signs BP 125/86 (BP Location: Right Arm)   Pulse (!) 101   Temp 98.4 F (36.9 C) (Oral)   Resp 14   Ht 6' (1.829 m)   Wt 170 lb (77.1 kg)   SpO2 97%   BMI 23.06 kg/m   Physical Exam  Constitutional: He is oriented to person, place, and time. He appears well-developed and well-nourished.  HENT:  Head: Normocephalic and atraumatic.  Eyes: Pupils are equal, round, and reactive to light.  Neck: Normal range of motion. Neck supple.  Cardiovascular: Regular rhythm and normal heart sounds.  Tachycardia present.   Pulmonary/Chest: Effort normal and breath sounds normal. No respiratory distress.  He has no wheezes. He has no rales. He exhibits no tenderness.  Abdominal: Soft. Bowel sounds are normal. There is no tenderness. There is no rebound and no guarding.  Musculoskeletal: Normal range of motion. He exhibits no edema.  Lymphadenopathy:    He has no cervical adenopathy.  Neurological: He is alert and oriented to person, place, and time.  Skin: Skin is warm and dry. No rash noted.  Psychiatric: He has a normal mood and affect.     ED Treatments / Results  Labs (all labs ordered are listed, but only  abnormal results are displayed) Labs Reviewed - No data to display  EKG  EKG Interpretation None       Radiology No results found.  Procedures Procedures (including critical care time)  Medications Ordered in ED Medications - No data to display   Initial Impression / Assessment and Plan / ED Course  I have reviewed the triage vital signs and the nursing notes.  Pertinent labs & imaging results that were available during my care of the patient were reviewed by me and considered in my medical decision making (see chart for details).     Patient presents with alcohol intoxication. He was allowed to sleep in the ED for a couple hours. He is now able to ambulate without assistance. He is alert and oriented. He was discharged in good condition. Return precautions were given.  Final Clinical Impressions(s) / ED Diagnoses   Final diagnoses:  Alcohol abuse  Alcoholic intoxication without complication [facility])    New Prescriptions New Prescriptions   No medications on file     Rolan Bucco, MD 10/07/16 1644

## 2016-10-07 NOTE — ED Triage Notes (Signed)
He tells [place] "I just want somewhere to lay down [sic] and get some hot food". He is in no distress.

## 2016-10-07 NOTE — ED Provider Notes (Signed)
[facility] Provider Note   CSN: [id number] Arrival date & time: 10/07/16  2147  By signing my name below, I, Jordan Davidson, attest that this documentation has been prepared under the direction and in the presence of Jordan Anis, PA-C.  Electronically Signed: Rosario Davidson, ED Scribe. 10/07/16. 10:33 PM.  History   Chief Complaint Chief Complaint  Patient presents with  . Alcohol Intoxication   LEVEL V CAVEAT: HPI and ROS limited due to alcohol intoxication  The history is provided by the patient. History limited by: alcohol intoxication. No language interpreter was used.    HPI Comments: Jordan Davidson is a 46 y.o. male who is well known in the ED w/ 27 visits over the past 59mo, BIB EMS, with a h/o alcohol abuse, who presents to the Emergency Department with request for alcohol detoxification. Pt was seen earlier in the afternoon for same and was given resources at that time for alcohol detoxification programs and felt appropriate for discharge home. Pt has also been seen in the ED several times over the past six months for same. He has not followed up with an outpatient clinic as of yet. He denies acute injury or pain. No SI/HI/AVH.  Past Medical History:  Diagnosis Date  . Alcohol abuse   . Alcoholism (HCC)   . Bipolar 1 disorder, depressed (HCC)   . Depression   . Thrombocytopenia [facility])    Patient Active Problem List   Diagnosis Date Noted  . Scalp laceration 05/01/2016  . Acute blood loss anemia 05/01/2016  . Thrombocytopenia (HCC) 05/01/2016  . Alcohol abuse 05/01/2016  . Blunt head trauma 04/28/2016  . Major depressive disorder, recurrent episode, moderate (HCC) 12/13/2015  . 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 Bipolar I disorder, most recent episode (or current) unspecified 02/14/2015  . Alcohol use disorder, severe, dependence (HCC) 02/13/2015    Past Surgical History:  Procedure Laterality Date  . NO PAST SURGERIES          Home Medications    Prior to Admission medications   Medication Sig Start Date End Date Taking? Authorizing Provider  amphetamine-dextroamphetamine (ADDERALL) 20 MG tablet Take 20 mg by mouth 2 (two) times daily.    Historical Provider, MD    Family History Family History  Problem Relation Age of Onset  . Hypertension Mother   . Cancer Father   . Heart failure Father   . Alcoholism Father     Social History Social History  Substance Use Topics  . Smoking status: Current Every Day Smoker    Types: Cigarettes  . Smokeless tobacco: Never Used  . Alcohol use Yes     Comment: daily - "whatever I can get" - 1/5 plus daily     Allergies   Patient has no known allergies.  Review of Systems Review of Systems  Unable to perform ROS: Other (alcohol intoxication)   Physical Exam Updated Vital Signs BP 137/76   Pulse (!) 104   Temp 97.9 F (36.6 C) (Oral)   Resp 16   SpO2 95%   Physical Exam  Constitutional: He appears well-developed and well-nourished. No distress.  Acutely intoxicated, poor hygiene.  HENT:  Head: Normocephalic and atraumatic.  Eyes: Conjunctivae are normal.  Neck: Normal range of motion.  Cardiovascular: Normal rate and regular rhythm.   Pulmonary/Chest: Effort normal. He has no wheezes. He has no rales.  Abdominal: He exhibits no distension. There is no tenderness.  Musculoskeletal: Normal range of motion.  Neurological:  He is alert.  Wakes easily, responsive, follows commands. Ambulatory.  Skin: Skin is warm and dry.  Nursing note and vitals reviewed.  ED Treatments / Results  DIAGNOSTIC STUDIES: Oxygen Saturation is 95% on RA, adequate by my interpretation.   Labs (all labs ordered are listed, but only abnormal results are displayed) Labs Reviewed - No data to display  EKG  EKG Interpretation None      Radiology No results found.  Procedures Procedures   Medications Ordered in ED Medications - No data to  display  Initial Impression / Assessment and Plan / ED Course  I have reviewed the triage vital signs and the nursing notes.  Pertinent labs & imaging results that were available during my care of the patient were reviewed by me and considered in my medical decision making (see chart for details).     Patient well known to the ED with history of alcohol abuse, currently intoxicated. Seen earlier today for same and deemed safe for discharge.   The patient is awake and follows commands. Resources for outpatient resources will again be given. The patient is verbal, abusive to staff, security at bedside. He is felt able to be discharged safely and is encouraged to contact substance abuse treatment opportunities in the community.  Final Clinical Impressions(s) / ED Diagnoses   Final diagnoses:  None   1. Alcohol dependence 2. Acute alcohol intoxication  New Prescriptions New Prescriptions   No medications on file   I personally performed the services described in this documentation, which was scribed in my presence. The recorded information has been reviewed and is accurate.     Jordan Anis, PA-C 10/07/16 2324    Marily Memos, MD 10/08/16 [phone]

## 2016-10-07 NOTE — ED Notes (Signed)
Pt stated "I just want somewhere to lay down and something to eat. Let's just skip the DR today.""

## 2016-10-07 NOTE — ED Notes (Signed)
Pt ambulated to restroom without assistance.

## 2016-10-07 NOTE — ED Provider Notes (Signed)
[facility] Provider Note   CSN: [id number] Arrival date & time: 10/06/16  1812     History   Chief Complaint Chief Complaint  Patient presents with  . Hungry  . Alcohol Intoxication    HPI Jordan Davidson is a 46 y.o. male.  HPI Patient presents to the emergency department with alcohol intoxication and requesting food.  The patient has no complaints.  He will not give me much information.  She was recently in the emergency department yesterday Past Medical History:  Diagnosis Date  . Alcohol abuse   . Alcoholism (HCC)   . Bipolar 1 disorder, depressed (HCC)   . Depression   . Thrombocytopenia [facility])     Patient Active Problem List   Diagnosis Date Noted  . Scalp laceration 05/01/2016  . Acute blood loss anemia 05/01/2016  . Thrombocytopenia (HCC) 05/01/2016  . Alcohol abuse 05/01/2016  . Blunt head trauma 04/28/2016  . Major depressive disorder, recurrent episode, moderate (HCC) 12/13/2015  . 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 Bipolar I disorder, most recent episode (or current) unspecified 02/14/2015  . Alcohol use disorder, severe, dependence (HCC) 02/13/2015    Past Surgical History:  Procedure Laterality Date  . NO PAST SURGERIES         Home Medications    Prior to Admission medications   Medication Sig Start Date End Date Taking? Authorizing Provider  amphetamine-dextroamphetamine (ADDERALL) 20 MG tablet Take 20 mg by mouth 2 (two) times daily.    Historical Provider, MD    Family History Family History  Problem Relation Age of Onset  . Hypertension Mother   . Cancer Father   . Heart failure Father   . Alcoholism Father     Social History Social History  Substance Use Topics  . Smoking status: Current Every Day Smoker    Types: Cigarettes  . Smokeless tobacco: Never Used  . Alcohol use Yes     Comment: daily - "whatever I can get" - 1/5 plus daily     Allergies   Patient has no known allergies.   Review  of Systems Review of Systems Level V caveat applies due to intoxication  Physical Exam Updated Vital Signs BP 116/73 (BP Location: Left Arm)   Pulse (!) 105   Temp 98.3 F (36.8 C) (Oral)   Resp 18   SpO2 93%   Physical Exam  Constitutional: He appears well-developed and well-nourished. No distress.  HENT:  Head: Normocephalic and atraumatic.  Eyes: Pupils are equal, round, and reactive to light.  Pulmonary/Chest: Effort normal.  Neurological: He is alert.  Skin: Skin is warm and dry.  Psychiatric: He has a normal mood and affect.  Nursing note and vitals reviewed.    ED Treatments / Results  Labs (all labs ordered are listed, but only abnormal results are displayed) Labs Reviewed - No data to display  EKG  EKG Interpretation None       Radiology No results found.  Procedures Procedures (including critical care time)  Medications Ordered in ED Medications - No data to display   Initial Impression / Assessment and Plan / ED Course  I have reviewed the triage vital signs and the nursing notes.  Pertinent labs & imaging results that were available during my care of the patient were reviewed by me and considered in my medical decision making (see chart for details).    Patient left the emergency department without informing staff.  He had been seen ambulating without difficulty  to the bathroom several times.  Staff did give him something to eat and is able to tolerate this without difficulty  Final Clinical Impressions(s) / ED Diagnoses   Final diagnoses:  None    New Prescriptions Discharge Medication List as of 10/06/2016 11:22 PM       Charlestine Night, PA-C 10/07/16 0113    Lyndal Pulley, MD 10/07/16 [phone]

## 2016-10-07 NOTE — ED Notes (Signed)
Pt is cursing loudly at staff requesting food.

## 2016-10-10 ENCOUNTER — Encounter (HOSPITAL_COMMUNITY): Payer: Self-pay | Admitting: Emergency Medicine

## 2016-10-10 ENCOUNTER — Emergency Department (HOSPITAL_COMMUNITY)
Admission: EM | Admit: 2016-10-10 | Discharge: 2016-10-10 | Disposition: A | Discharge status: Home / Self Care | Payer: Self-pay | Attending: Emergency Medicine | Admitting: Emergency Medicine

## 2016-10-10 DIAGNOSIS — F10120 Alcohol abuse with intoxication, uncomplicated: Secondary | ICD-10-CM | POA: Insufficient documentation

## 2016-10-10 DIAGNOSIS — F17210 Nicotine dependence, cigarettes, uncomplicated: Secondary | ICD-10-CM | POA: Insufficient documentation

## 2016-10-10 DIAGNOSIS — F10920 Alcohol use, unspecified with intoxication, uncomplicated: Secondary | ICD-10-CM

## 2016-10-10 DIAGNOSIS — Z79899 Other long term (current) drug therapy: Secondary | ICD-10-CM | POA: Insufficient documentation

## 2016-10-10 MED ORDER — LORAZEPAM 1 MG PO TABS
1.0000 mg | ORAL_TABLET | Freq: Once | ORAL | Status: AC
  Administered 2016-10-10: 1 mg via ORAL
  Filled 2016-10-10: qty 1

## 2016-10-10 NOTE — ED Notes (Signed)
Pt laying in triage floor during time of triage. Pt states he wants a place to sleep.

## 2016-10-10 NOTE — ED Triage Notes (Signed)
Pt presents by EMS for alcohol intoxication. Pt reports to drinking yesterday.

## 2016-10-10 NOTE — Discharge Instructions (Signed)
Stop drinking. Check yourself into one of the treatment programs.

## 2016-10-10 NOTE — ED Provider Notes (Signed)
[facility] Provider Note   CSN: [id number] Arrival date & time: 10/10/16  0015     History   Chief Complaint Chief Complaint  Patient presents with  . Alcohol Intoxication    HPI Jordan Davidson is a 46 y.o. male.  HPI Jordan Davidson is a 46 y.o. male with history of alcohol abuse and bipolar depression, presents to emergency department after alcohol intoxication. Patient was intoxicated, brought in by EMS. Patient has spent the last 11 hours on the floor in the waiting room. He has been asleep up until now. He states he feels shaky at this time. He states he is ready to quit drinking and will try to get in a halfway house. He denies any nausea or vomiting. No chest pain. No abdominal pain. No black or tarry stools. He states he is homeless.  Past Medical History:  Diagnosis Date  . Alcohol abuse   . Alcoholism (HCC)   . Bipolar 1 disorder, depressed (HCC)   . Depression   . Thrombocytopenia [facility])     Patient Active Problem List   Diagnosis Date Noted  . Scalp laceration 05/01/2016  . Acute blood loss anemia 05/01/2016  . Thrombocytopenia (HCC) 05/01/2016  . Alcohol abuse 05/01/2016  . Blunt head trauma 04/28/2016  . Major depressive disorder, recurrent episode, moderate (HCC) 12/13/2015  . 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 Bipolar I disorder, most recent episode (or current) unspecified 02/14/2015  . Alcohol use disorder, severe, dependence (HCC) 02/13/2015    Past Surgical History:  Procedure Laterality Date  . NO PAST SURGERIES         Home Medications    Prior to Admission medications   Medication Sig Start Date End Date Taking? Authorizing Provider  amphetamine-dextroamphetamine (ADDERALL) 20 MG tablet Take 20 mg by mouth 2 (two) times daily.    Historical Provider, MD    Family History Family History  Problem Relation Age of Onset  . Hypertension Mother   . Cancer Father   . Heart failure Father   . Alcoholism  Father     Social History Social History  Substance Use Topics  . Smoking status: Current Every Day Smoker    Types: Cigarettes  . Smokeless tobacco: Never Used  . Alcohol use Yes     Comment: daily - "whatever I can get" - 1/5 plus daily     Allergies   Patient has no known allergies.   Review of Systems Review of Systems  Constitutional: Negative for chills and fever.  Respiratory: Negative for cough, chest tightness and shortness of breath.   Cardiovascular: Negative for chest pain, palpitations and leg swelling.  Gastrointestinal: Negative for abdominal distention, abdominal pain, diarrhea, nausea and vomiting.  Genitourinary: Negative for dysuria, frequency, hematuria and urgency.  Musculoskeletal: Negative for arthralgias, myalgias, neck pain and neck stiffness.  Skin: Negative for rash.  Allergic/Immunologic: Negative for immunocompromised state.  Neurological: Negative for dizziness, weakness, light-headedness, numbness and headaches.  All other systems reviewed and are negative.    Physical Exam Updated Vital Signs BP 124/88 (BP Location: Right Arm)   Pulse 98   Temp 98.3 F (36.8 C) (Oral)   Resp 16   Ht 6' (1.829 m)   Wt 81.6 kg   SpO2 94%   BMI 24.41 kg/m   Physical Exam  Constitutional: He appears well-developed and well-nourished.  Disheveled.  HENT:  Head: Normocephalic and atraumatic.  Eyes: Conjunctivae are normal.  Neck: Neck supple.  Cardiovascular: Normal rate,  regular rhythm and normal heart sounds.   Pulmonary/Chest: Effort normal. No respiratory distress. He has no wheezes. He has no rales.  Abdominal: Soft. Bowel sounds are normal. He exhibits no distension. There is no tenderness. There is no rebound.  Musculoskeletal: He exhibits no edema.  No significant tremor noted  Neurological: He is alert.  Skin: Skin is warm and dry.  Nursing note and vitals reviewed.    ED Treatments / Results  Labs (all labs ordered are listed, but  only abnormal results are displayed) Labs Reviewed - No data to display  EKG  EKG Interpretation None       Radiology No results found.  Procedures Procedures (including critical care time)  Medications Ordered in ED Medications  LORazepam (ATIVAN) tablet 1 mg (not administered)     Initial Impression / Assessment and Plan / ED Course  I have reviewed the triage vital signs and the nursing notes.  Pertinent labs & imaging results that were available during my care of the patient were reviewed by me and considered in my medical decision making (see chart for details).     Patient in emergency department after being found intoxicated, brought by EMS last night. He spent 11+ hours in the waiting room sleeping on the floor. He is now awake, alert, oriented 3. He reports feeling like may start withdrawing. No significant tremor noted at this time. No evidence of head trauma. Vital signs are normal. We will discharge him home with outpatient follow-up. Insturcted to try to get into detox to stop drinking.       Final Clinical Impressions(s) / ED Diagnoses   Final diagnoses:  Alcoholic intoxication without complication [facility])    New Prescriptions Discharge Medication List as of 10/10/2016 12:29 PM       Jaynie Crumble, PA-C 10/10/16 1521    Canary Brim Tegeler, MD 10/10/16 2106

## 2016-10-11 ENCOUNTER — Encounter (HOSPITAL_COMMUNITY): Payer: Self-pay

## 2016-10-11 ENCOUNTER — Emergency Department (HOSPITAL_COMMUNITY)
Admission: EM | Admit: 2016-10-11 | Discharge: 2016-10-12 | Disposition: A | Discharge status: Home / Self Care | Payer: Self-pay | Attending: Emergency Medicine | Admitting: Emergency Medicine

## 2016-10-11 DIAGNOSIS — Z79899 Other long term (current) drug therapy: Secondary | ICD-10-CM | POA: Insufficient documentation

## 2016-10-11 DIAGNOSIS — F17210 Nicotine dependence, cigarettes, uncomplicated: Secondary | ICD-10-CM | POA: Insufficient documentation

## 2016-10-11 DIAGNOSIS — F10120 Alcohol abuse with intoxication, uncomplicated: Secondary | ICD-10-CM | POA: Insufficient documentation

## 2016-10-11 DIAGNOSIS — F10920 Alcohol use, unspecified with intoxication, uncomplicated: Secondary | ICD-10-CM

## 2016-10-11 DIAGNOSIS — M25531 Pain in right wrist: Secondary | ICD-10-CM | POA: Insufficient documentation

## 2016-10-11 DIAGNOSIS — M25512 Pain in left shoulder: Secondary | ICD-10-CM | POA: Insufficient documentation

## 2016-10-11 NOTE — ED Provider Notes (Signed)
[facility] Provider Note   CSN: [id number] Arrival date & time: 10/11/16  2140  By signing my name below, I, Elder Negus, attest that this documentation has been prepared under the direction and in the presence of Devoria Albe, MD. Electronically Signed: Elder Negus, Scribe. 10/11/16. 11:36 PM.  Time seen 23:25 PM    History   Chief Complaint Chief Complaint  Patient presents with  . Alcohol Intoxication    HPI Jordan Davidson is a 46 y.o. male with history of alcoholism, bipolarism, and homelessness who presents to the ED with alcohol intoxication. According to medic report, this patient was brought to the emergency department after being found publicly intoxicated at a gas station. Ambulatory. At interview, he states that 2 days ago he fell from standing and injured his R wrist/hand and L shoulder. He was seen at this facility last night but decided "not to get it addressed" then. No decreased sensation or loss of function distally. He is R hand dominant. Reporting moderate pain to the L shoulder, worse when ranging. He drinks "1 to 1.5 of a fifth" and "4-5 beers" of 24 ounces each per day. Last detox treatment was 2 years ago. Denies any drug use. No SI/HI. States he wants to get back in [place], but can't go "until I get sobered up".   The history is provided by the patient. No language interpreter was used.   PCP PLACEY,MARY H, NP  Doesn't go to the [facility] anymore because "they don't do anything for me and I have to leave at 3 pm"   Past Medical History:  Diagnosis Date  . Alcohol abuse   . Alcoholism (HCC)   . Bipolar 1 disorder, depressed (HCC)   . Depression   . Thrombocytopenia [facility])     Patient Active Problem List   Diagnosis Date Noted  . Scalp laceration 05/01/2016  . Acute blood loss anemia 05/01/2016  . Thrombocytopenia (HCC) 05/01/2016  . Alcohol abuse 05/01/2016  . Blunt head trauma 04/28/2016  . Major depressive disorder, recurrent episode,  moderate (HCC) 12/13/2015  . 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 Bipolar I disorder, most recent episode (or current) unspecified 02/14/2015  . Alcohol use disorder, severe, dependence (HCC) 02/13/2015    Past Surgical History:  Procedure Laterality Date  . NO PAST SURGERIES         Home Medications    Prior to Admission medications   Medication Sig Start Date End Date Taking? Authorizing Provider  amphetamine-dextroamphetamine (ADDERALL) 20 MG tablet Take 20 mg by mouth 2 (two) times daily.   Yes Historical Provider, MD    Family History Family History  Problem Relation Age of Onset  . Hypertension Mother   . Cancer Father   . Heart failure Father   . Alcoholism Father     Social History Social History  Substance Use Topics  . Smoking status: Current Every Day Smoker    Types: Cigarettes  . Smokeless tobacco: Never Used  . Alcohol use Yes     Comment: daily - "whatever I can get" - 1/5 plus daily  homeless Unemployed [employer] to get money for his alcohol   Allergies   Patient has no known allergies.   Review of Systems Review of Systems  Musculoskeletal:       L shoulder and R wrist pain.   Neurological: Negative for weakness and numbness.  Psychiatric/Behavioral:       Alcoholism  All other systems reviewed and are negative.  Physical Exam Updated Vital Signs BP 125/84 (BP Location: Left Arm)   Pulse 89   Temp 98.3 F (36.8 C) (Oral)   Resp 18   SpO2 93%   Vital signs normal    Physical Exam  Constitutional: He is oriented to person, place, and time. He appears well-developed and well-nourished.  Non-toxic appearance. He does not appear ill. No distress.  HENT:  Head: Normocephalic and atraumatic.  Right Ear: External ear normal.  Left Ear: External ear normal.  Nose: Nose normal. No mucosal edema or rhinorrhea.  Mouth/Throat: Oropharynx is clear and moist and mucous membranes are normal. No dental abscesses or uvula  swelling.  Eyes: Conjunctivae and EOM are normal. Pupils are equal, round, and reactive to light.  Neck: Normal range of motion and full passive range of motion without pain. Neck supple.  Cardiovascular: Normal rate, regular rhythm and normal heart sounds.  Exam reveals no gallop and no friction rub.   No murmur heard. Pulmonary/Chest: Effort normal and breath sounds normal. No respiratory distress. He has no wheezes. He has no rhonchi. He has no rales. He exhibits no tenderness and no crepitus.  Abdominal: Soft. Normal appearance and bowel sounds are normal. He exhibits no distension. There is no tenderness. There is no rebound and no guarding.  Musculoskeletal: He exhibits no edema.  There is tenderness to the L shoulder with intact range of motion that he states is painful. No overlying trauma or obvious deformity. There is also a small bony deformity to the proximal metatarsal of the R index finger. No abrasions, soft swelling.   Neurological: He is alert and oriented to person, place, and time. He has normal strength. No cranial nerve deficit.  Skin: Skin is warm, dry and intact. No rash noted. No erythema. No pallor.  Psychiatric: He has a normal mood and affect. His speech is normal and behavior is normal. His mood appears not anxious.  Nursing note and vitals reviewed.    ED Treatments / Results  DIAGNOSTIC STUDIES: Oxygen Saturation is 93 percent on room air which is normal by my interpretation.     Labs (all labs ordered are listed, but only abnormal results are displayed) Labs Reviewed - No data to display  EKG  EKG Interpretation None       Radiology No results found.  Procedures Procedures (including critical care time)  Medications Ordered in ED Medications - No data to display   Initial Impression / Assessment and Plan / ED Course  I have reviewed the triage vital signs and the nursing notes.  Pertinent labs & imaging results that were available during my  care of the patient were reviewed by me and considered in my medical decision making (see chart for details).  COORDINATION OF CARE: 11:35 PM Informed the patient that he would be allowed to metabolize in the emergency department and be discharged at a later time. He verbalized agreement with this plan.   04:47 AM patient was discharged from the ED.   On review of his prior charts patient had x-rays obtained of his right hand on March 20 and February 25. He does have an old healed proximal meta-carpal fracture of his index finger. At this point I do not suspect he has any new acute injuries, no new x-rays were ordered. Patient has a care plan due to frequent ED visits. He has had 29 visits in the last 6 months all for  being intoxicated from alcohol. He has used up his visits  at the local detox/rehabilitation centers.He not expressed any SI or HI. Will discharge once sober enough to not injure himself.    Final Clinical Impressions(s) / ED Diagnoses   Final diagnoses:  Alcoholic intoxication without complication [facility])    Plan discharge  Devoria Albe, MD, Concha Pyo, MD 10/12/16 [phone]

## 2016-10-11 NOTE — ED Triage Notes (Signed)
Pt was picked up at a gas station after being found intoxicated. Hx of same. Ambulatory with assistance. Alert and oriented.

## 2016-10-11 NOTE — ED Triage Notes (Signed)
States drinking too much and came here for food and IV fluids, alert and oriented.

## 2016-10-11 NOTE — ED Notes (Signed)
Bed: [facility] Expected date:  Expected time:  Means of arrival:  Comments: 

## 2016-10-12 ENCOUNTER — Encounter (HOSPITAL_COMMUNITY): Payer: Self-pay

## 2016-10-12 DIAGNOSIS — F10120 Alcohol abuse with intoxication, uncomplicated: Secondary | ICD-10-CM | POA: Insufficient documentation

## 2016-10-12 DIAGNOSIS — Z79899 Other long term (current) drug therapy: Secondary | ICD-10-CM | POA: Insufficient documentation

## 2016-10-12 DIAGNOSIS — Z5321 Procedure and treatment not carried out due to patient leaving prior to being seen by health care provider: Secondary | ICD-10-CM | POA: Insufficient documentation

## 2016-10-12 DIAGNOSIS — F17210 Nicotine dependence, cigarettes, uncomplicated: Secondary | ICD-10-CM | POA: Insufficient documentation

## 2016-10-12 NOTE — ED Notes (Signed)
Pt refused to let staff get his vital signs.

## 2016-10-12 NOTE — ED Notes (Signed)
Pt found passed out outside. Pt currently awake and alert.

## 2016-10-12 NOTE — ED Notes (Signed)
No respiratory or acute distress noted resting in bed call light in reach. 

## 2016-10-12 NOTE — ED Notes (Signed)
Pt refused to sign discharge instructions. While cursing at nurse.

## 2016-10-13 ENCOUNTER — Encounter (HOSPITAL_COMMUNITY): Payer: Self-pay

## 2016-10-13 ENCOUNTER — Inpatient Hospital Stay (HOSPITAL_COMMUNITY)
Admission: EM | Admit: 2016-10-13 | Discharge: 2016-10-17 | DRG: 897 | Disposition: A | Discharge status: Home / Self Care | Payer: Self-pay | Attending: Family Medicine | Admitting: Family Medicine

## 2016-10-13 ENCOUNTER — Emergency Department (HOSPITAL_COMMUNITY)
Admission: EM | Admit: 2016-10-13 | Discharge: 2016-10-13 | Disposition: A | Discharge status: Home / Self Care | Payer: Self-pay | Attending: Dermatology | Admitting: Dermatology

## 2016-10-13 DIAGNOSIS — Z8249 Family history of ischemic heart disease and other diseases of the circulatory system: Secondary | ICD-10-CM

## 2016-10-13 DIAGNOSIS — D696 Thrombocytopenia, unspecified: Secondary | ICD-10-CM | POA: Diagnosis present

## 2016-10-13 DIAGNOSIS — F1020 Alcohol dependence, uncomplicated: Secondary | ICD-10-CM | POA: Diagnosis present

## 2016-10-13 DIAGNOSIS — F10939 Alcohol use, unspecified with withdrawal, unspecified: Secondary | ICD-10-CM | POA: Diagnosis present

## 2016-10-13 DIAGNOSIS — F319 Bipolar disorder, unspecified: Secondary | ICD-10-CM | POA: Diagnosis present

## 2016-10-13 DIAGNOSIS — F10239 Alcohol dependence with withdrawal, unspecified: Secondary | ICD-10-CM | POA: Diagnosis present

## 2016-10-13 DIAGNOSIS — F10230 Alcohol dependence with withdrawal, uncomplicated: Principal | ICD-10-CM | POA: Diagnosis present

## 2016-10-13 DIAGNOSIS — Z811 Family history of alcohol abuse and dependence: Secondary | ICD-10-CM

## 2016-10-13 DIAGNOSIS — F10930 Alcohol use, unspecified with withdrawal, uncomplicated: Secondary | ICD-10-CM

## 2016-10-13 DIAGNOSIS — F17210 Nicotine dependence, cigarettes, uncomplicated: Secondary | ICD-10-CM | POA: Diagnosis present

## 2016-10-13 DIAGNOSIS — Z590 Homelessness: Secondary | ICD-10-CM

## 2016-10-13 DIAGNOSIS — Z809 Family history of malignant neoplasm, unspecified: Secondary | ICD-10-CM

## 2016-10-13 DIAGNOSIS — Z79899 Other long term (current) drug therapy: Secondary | ICD-10-CM

## 2016-10-13 LAB — CBC WITH DIFFERENTIAL/PLATELET
Basophils Absolute: 0 10*3/uL (ref 0.0–0.1)
Basophils Relative: 1 %
EOS ABS: 0 10*3/uL (ref 0.0–0.7)
Eosinophils Relative: 0 %
HCT: 36.7 % — ABNORMAL LOW (ref 39.0–52.0)
HEMOGLOBIN: 12.8 g/dL — AB (ref 13.0–17.0)
Lymphocytes Relative: 10 %
Lymphs Abs: 0.5 10*3/uL — ABNORMAL LOW (ref 0.7–4.0)
MCH: 32.4 pg (ref 26.0–34.0)
MCHC: 34.9 g/dL (ref 30.0–36.0)
MCV: 92.9 fL (ref 78.0–100.0)
MONOS PCT: 13 %
Monocytes Absolute: 0.7 10*3/uL (ref 0.1–1.0)
NEUTROS PCT: 77 %
Neutro Abs: 4.1 10*3/uL (ref 1.7–7.7)
PLATELETS: 40 10*3/uL — AB (ref 150–400)
RBC: 3.95 MIL/uL — AB (ref 4.22–5.81)
RDW: 15.8 % — ABNORMAL HIGH (ref 11.5–15.5)
WBC: 5.4 10*3/uL (ref 4.0–10.5)

## 2016-10-13 LAB — COMPREHENSIVE METABOLIC PANEL
ALT: 71 U/L — AB (ref 17–63)
AST: 161 U/L — AB (ref 15–41)
Albumin: 4.4 g/dL (ref 3.5–5.0)
Alkaline Phosphatase: 87 U/L (ref 38–126)
Anion gap: 10 (ref 5–15)
BILIRUBIN TOTAL: 1.1 mg/dL (ref 0.3–1.2)
BUN: 12 mg/dL (ref 6–20)
CO2: 27 mmol/L (ref 22–32)
CREATININE: 0.59 mg/dL — AB (ref 0.61–1.24)
Calcium: 9.2 mg/dL (ref 8.9–10.3)
Chloride: 102 mmol/L (ref 101–111)
Glucose, Bld: 112 mg/dL — ABNORMAL HIGH (ref 65–99)
POTASSIUM: 3.7 mmol/L (ref 3.5–5.1)
Sodium: 139 mmol/L (ref 135–145)
TOTAL PROTEIN: 7.3 g/dL (ref 6.5–8.1)

## 2016-10-13 LAB — MAGNESIUM: MAGNESIUM: 1.4 mg/dL — AB (ref 1.7–2.4)

## 2016-10-13 LAB — ETHANOL

## 2016-10-13 MED ORDER — LORAZEPAM 2 MG/ML IJ SOLN
0.0000 mg | Freq: Four times a day (QID) | INTRAMUSCULAR | Status: DC
  Administered 2016-10-14: 2 mg via INTRAVENOUS
  Filled 2016-10-13 (×2): qty 1

## 2016-10-13 MED ORDER — SODIUM CHLORIDE 0.9 % IV BOLUS (SEPSIS)
1000.0000 mL | Freq: Once | INTRAVENOUS | Status: AC
Start: 2016-10-13 — End: 2016-10-13
  Administered 2016-10-13: 1000 mL via INTRAVENOUS

## 2016-10-13 MED ORDER — THIAMINE HCL 100 MG/ML IJ SOLN: 100.0000 mg | Freq: Every day | INTRAMUSCULAR | Status: DC

## 2016-10-13 MED ORDER — VITAMIN B-1 100 MG PO TABS
100.0000 mg | ORAL_TABLET | Freq: Every day | ORAL | Status: DC
  Administered 2016-10-14 – 2016-10-17 (×4): 100 mg via ORAL
  Filled 2016-10-13 (×4): qty 1

## 2016-10-13 MED ORDER — LORAZEPAM 2 MG/ML IJ SOLN: 2.0000 mg | Freq: Once | INTRAMUSCULAR | Status: DC

## 2016-10-13 MED ORDER — CHLORDIAZEPOXIDE HCL 25 MG PO CAPS
50.0000 mg | ORAL_CAPSULE | Freq: Once | ORAL | Status: AC
  Administered 2016-10-13: 50 mg via ORAL
  Filled 2016-10-13: qty 2

## 2016-10-13 MED ORDER — MAGNESIUM SULFATE 2 GM/50ML IV SOLN
2.0000 g | Freq: Once | INTRAVENOUS | Status: DC
Start: 2016-10-13 — End: 2016-10-13

## 2016-10-13 MED ORDER — MAGNESIUM OXIDE 400 (241.3 MG) MG PO TABS
800.0000 mg | ORAL_TABLET | Freq: Once | ORAL | Status: AC
  Administered 2016-10-13: 800 mg via ORAL
  Filled 2016-10-13 (×2): qty 2

## 2016-10-13 MED ORDER — MAGNESIUM SULFATE 2 GM/50ML IV SOLN
2.0000 g | Freq: Once | INTRAVENOUS | Status: AC
  Administered 2016-10-13: 2 g via INTRAVENOUS
  Filled 2016-10-13: qty 50

## 2016-10-13 MED ORDER — THIAMINE HCL 100 MG/ML IJ SOLN
100.0000 mg | Freq: Once | INTRAMUSCULAR | Status: AC
  Administered 2016-10-13: 100 mg via INTRAVENOUS
  Filled 2016-10-13: qty 2

## 2016-10-13 MED ORDER — FOLIC ACID 1 MG PO TABS
1.0000 mg | ORAL_TABLET | Freq: Once | ORAL | Status: AC
  Administered 2016-10-13: 1 mg via ORAL
  Filled 2016-10-13: qty 1

## 2016-10-13 MED ORDER — LORAZEPAM 2 MG/ML IJ SOLN: 0.0000 mg | Freq: Two times a day (BID) | INTRAMUSCULAR | Status: DC

## 2016-10-13 MED ORDER — LORAZEPAM 2 MG/ML IJ SOLN
2.0000 mg | Freq: Once | INTRAMUSCULAR | Status: AC
  Administered 2016-10-13: 2 mg via INTRAVENOUS
  Filled 2016-10-13: qty 1

## 2016-10-13 NOTE — ED Notes (Signed)
Pt bib [employer] , pt disruptive in lobby, demanding his cigarettes. Officer that brought pt in told pt if he could walk he could get them. Pt walked outside with officer and [facility]

## 2016-10-13 NOTE — ED Notes (Signed)
Pt states he had a seizure and that is his reason for arrival this evening. Pt alert and active with his care. Pt was aware of his seizure.

## 2016-10-13 NOTE — ED Provider Notes (Addendum)
[facility]-EMERGENCY DEPT Provider Note   CSN: [id number] Arrival [id number]: 10/13/16  1656     History   Chief Complaint Chief Complaint  Patient presents with  . Alcohol Problem  . Seizures    HPI Jordan Davidson is a 46 y.o. male.  HPI   46 yo M with PMHx as below including chronic alcohol dependence here with desire for detox. Pt states that over past 24 hours, he has decided to stop drinking. He is trying to get into a [facility]fl[facility] house but must detox first. He has a h/o withdrawals including seizures and DTs. He believes that he may have had one seizure today and endorses generalized tremors, nausea, and anxiety. No fevers. Denies head or tongue trauma. No SI, HI. He states that these sx all started several hours after his last drink. He strongly desires to stop drinking.   Past Medical History:  Diagnosis Date  . Alcohol abuse   . Alcoholism (HCC)   . Bipolar 1 disorder, depressed (HCC)   . Depression   . Thrombocytopenia [facility])     Patient Active Problem List   Diagnosis Date Noted  . Hypomagnesemia 10/14/2016  . Alcohol withdrawal (HCC) 10/13/2016  . Scalp laceration 05/01/2016  . Acute blood loss anemia 05/01/2016  . Thrombocytopenia (HCC) 05/01/2016  . Alcohol abuse 05/01/2016  . Blunt head trauma 04/28/2016  . Major depressive disorder, recurrent episode, moderate (HCC) 12/13/2015  . 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 Bipolar I disorder (HCC) 02/14/2015  . Alcohol use disorder, severe, dependence (HCC) 02/13/2015    Past Surgical History:  Procedure Laterality Date  . NO PAST SURGERIES         Home Medications    Prior to Admission medications   Medication Sig Start Date End Date Taking? Authorizing Provider  gabapentin (NEURONTIN) 300 MG capsule Take 300 mg by mouth 3 (three) times daily.   Yes Historical Provider, MD  OLANZapine (ZYPREXA) 10 MG tablet Take 10 mg by mouth at bedtime.   Yes Historical Provider, MD  traZODone (DESYREL)  100 MG tablet Take 200 mg by mouth at bedtime.   Yes Historical Provider, MD  amphetamine-dextroamphetamine (ADDERALL) 20 MG tablet Take 20 mg by mouth 2 (two) times daily.    Historical Provider, MD    Family History Family History  Problem Relation Age of Onset  . Hypertension Mother   . Cancer Father   . Heart failure Father   . Alcoholism Father     Social History Social History  Substance Use Topics  . Smoking status: Current Every Day Smoker    Types: Cigarettes  . Smokeless tobacco: Never Used  . Alcohol use Yes     Comment: daily - "whatever I can get" - 1/5 plus daily     Allergies   Patient has no known allergies.   Review of Systems Review of Systems  Constitutional: Positive for fatigue. Negative for chills and fever.  HENT: Negative for congestion and rhinorrhea.   Eyes: Negative for visual disturbance.  Respiratory: Negative for cough, shortness of breath and wheezing.   Cardiovascular: Negative for chest pain and leg swelling.  Gastrointestinal: Negative for abdominal pain, diarrhea, nausea and vomiting.  Genitourinary: Negative for dysuria and flank pain.  Musculoskeletal: Negative for neck pain and neck stiffness.  Skin: Negative for rash and wound.  Allergic/Immunologic: Negative for immunocompromised state.  Neurological: Positive for tremors, seizures and weakness. Negative for syncope and headaches.  All other systems reviewed and are  negative.    Physical Exam Updated Vital Signs BP (!) 176/118 (BP Location: Right Arm)   Pulse (!) 103   Temp 98.9 F (37.2 C) (Oral)   Resp (!) 22   Ht 6' (1.829 m)   Wt 180 lb (81.6 kg)   SpO2 96%   BMI 24.41 kg/m   Physical Exam  Constitutional: He is oriented to person, place, and time. He appears well-developed and well-nourished. No distress.  HENT:  Head: Normocephalic and atraumatic.  Poor dentition. No oral trauma. No tongue lacerations.  Eyes: Conjunctivae are normal.  Neck: Neck supple.    Cardiovascular: Normal rate, regular rhythm and normal heart sounds.  Exam reveals no friction rub.   No murmur heard. Pulmonary/Chest: Effort normal and breath sounds normal. No respiratory distress. He has no wheezes. He has no rales.  Abdominal: He exhibits no distension.  Musculoskeletal: He exhibits no edema.  Neurological: He is alert and oriented to person, place, and time. He exhibits normal muscle tone.  Moderate b/l UE high frequency tremor  Skin: Skin is warm. Capillary refill takes less than 2 seconds.  Psychiatric: He has a normal mood and affect.  Nursing note and vitals reviewed.    ED Treatments / Results  Labs (all labs ordered are listed, but only abnormal results are displayed) Labs Reviewed  CBC WITH DIFFERENTIAL/PLATELET - Abnormal; Notable for the following:       Result Value   RBC 3.95 (*)    Hemoglobin 12.8 (*)    HCT 36.7 (*)    RDW 15.8 (*)    Platelets 40 (*)    Lymphs Abs 0.5 (*)    All other components within normal limits  COMPREHENSIVE METABOLIC PANEL - Abnormal; Notable for the following:    Glucose, Bld 112 (*)    Creatinine, Ser 0.59 (*)    AST 161 (*)    ALT 71 (*)    All other components within normal limits  MAGNESIUM - Abnormal; Notable for the following:    Magnesium 1.4 (*)    All other components within normal limits  ETHANOL  PROTIME-INR  PHOSPHORUS  URINALYSIS, ROUTINE W REFLEX MICROSCOPIC  RAPID URINE DRUG SCREEN, HOSP PERFORMED  TYPE AND SCREEN    EKG  EKG Interpretation None       Radiology No results found.  Procedures Procedures (including critical care time)  Medications Ordered in ED Medications  thiamine (VITAMIN B-1) tablet 100 mg (not administered)    Or  thiamine (B-1) injection 100 mg (not administered)  LORazepam (ATIVAN) injection 0-4 mg (2 mg Intravenous Given 10/14/16 0109)    Followed by  LORazepam (ATIVAN) injection 0-4 mg (not administered)  0.9 %  sodium chloride infusion ( Intravenous New  Bag/Given 10/14/16 0110)  LORazepam (ATIVAN) injection 2-3 mg (not administered)  folic acid injection 1 mg (not administered)  chlordiazePOXIDE (LIBRIUM) capsule 50 mg (50 mg Oral Given 10/13/16 2013)  magnesium oxide (MAG-OX) tablet 800 mg (800 mg Oral Given 10/13/16 2144)  sodium chloride 0.9 % bolus 1,000 mL (0 mLs Intravenous Stopped 10/13/16 2348)  LORazepam (ATIVAN) injection 2 mg (2 mg Intravenous Given 10/13/16 2208)  thiamine (B-1) injection 100 mg (100 mg Intravenous Given 10/13/16 2208)  folic acid (FOLVITE) tablet 1 mg (1 mg Oral Given 10/13/16 2208)  magnesium sulfate IVPB 2 g 50 mL (0 g Intravenous Stopped 10/13/16 2341)     Initial Impression / Assessment and Plan / ED Course  I have reviewed the triage vital signs  and the nursing notes.  Pertinent labs & imaging results that were available during my care of the patient were reviewed by me and considered in my medical decision making (see chart for details).    46 yo M with PMHx alcohol abuse here with tremors, possible seizure in setting of stopping alcohol use. On arrival, pt tachycardic, hypertensive c/f early EtOH w/d. No hallucinations or signs of DTs but he is very high risk of this. Lab work shows chronic thrombocytopenia, also hypomagnesemia likely nutritional and related to his chronic alcoholism, likely cirrhosis. Pt given librium and ativan here with persistent sx. He expresses strong desire for detox - will admit for high risk detox. CIWA in place. Thiamine, folic acid given.  Final Clinical Impressions(s) / ED Diagnoses   Final diagnoses:  Alcohol withdrawal syndrome without complication (HCC)  Hypomagnesemia  Thrombocytopenia (HCC)    New Prescriptions New Prescriptions   No medications on file     Shaune Pollack, MD 10/14/16 1610    Shaune Pollack, MD 10/14/16 [phone]

## 2016-10-13 NOTE — ED Triage Notes (Signed)
Pt states he is here for ETOH problems and is requesting detox. Denies any other problems at this time. Denies SI/HI

## 2016-10-13 NOTE — ED Notes (Signed)
Bed: [facility] Expected date:  Expected time:  Means of arrival:  Comments: HOLD

## 2016-10-14 DIAGNOSIS — F10230 Alcohol dependence with withdrawal, uncomplicated: Principal | ICD-10-CM

## 2016-10-14 LAB — URINALYSIS, ROUTINE W REFLEX MICROSCOPIC
Bacteria, UA: NONE SEEN
Bilirubin Urine: NEGATIVE
Glucose, UA: NEGATIVE mg/dL
Ketones, ur: 20 mg/dL — AB
Leukocytes, UA: NEGATIVE
Nitrite: NEGATIVE
Protein, ur: 100 mg/dL — AB
Specific Gravity, Urine: 1.025 (ref 1.005–1.030)
Squamous Epithelial / HPF: NONE SEEN
pH: 7 (ref 5.0–8.0)

## 2016-10-14 LAB — PROTIME-INR
INR: 0.83
INR: 0.98
PROTHROMBIN TIME: 13 s (ref 11.4–15.2)
Prothrombin Time: 11.4 seconds (ref 11.4–15.2)

## 2016-10-14 LAB — CBC
HEMATOCRIT: 35.4 % — AB (ref 39.0–52.0)
HEMOGLOBIN: 12.3 g/dL — AB (ref 13.0–17.0)
MCH: 32.5 pg (ref 26.0–34.0)
MCHC: 34.7 g/dL (ref 30.0–36.0)
MCV: 93.4 fL (ref 78.0–100.0)
Platelets: 38 10*3/uL — ABNORMAL LOW (ref 150–400)
RBC: 3.79 MIL/uL — AB (ref 4.22–5.81)
RDW: 15.8 % — ABNORMAL HIGH (ref 11.5–15.5)
WBC: 3.9 10*3/uL — ABNORMAL LOW (ref 4.0–10.5)

## 2016-10-14 LAB — MAGNESIUM: Magnesium: 2 mg/dL (ref 1.7–2.4)

## 2016-10-14 LAB — COMPREHENSIVE METABOLIC PANEL
ALBUMIN: 3.7 g/dL (ref 3.5–5.0)
ALT: 56 U/L (ref 17–63)
AST: 116 U/L — ABNORMAL HIGH (ref 15–41)
Alkaline Phosphatase: 78 U/L (ref 38–126)
Anion gap: 9 (ref 5–15)
BILIRUBIN TOTAL: 1.1 mg/dL (ref 0.3–1.2)
BUN: 9 mg/dL (ref 6–20)
CO2: 26 mmol/L (ref 22–32)
Calcium: 8.7 mg/dL — ABNORMAL LOW (ref 8.9–10.3)
Chloride: 102 mmol/L (ref 101–111)
Creatinine, Ser: 0.57 mg/dL — ABNORMAL LOW (ref 0.61–1.24)
GFR calc non Af Amer: >60 mL/min (ref >60)
GLUCOSE: 95 mg/dL (ref 65–99)
POTASSIUM: 3.5 mmol/L (ref 3.5–5.1)
SODIUM: 137 mmol/L (ref 135–145)
TOTAL PROTEIN: 6.4 g/dL — AB (ref 6.5–8.1)

## 2016-10-14 LAB — RAPID URINE DRUG SCREEN, HOSP PERFORMED
AMPHETAMINES: NOT DETECTED
BARBITURATES: NOT DETECTED
Benzodiazepines: POSITIVE — AB
Cocaine: NOT DETECTED
Opiates: NOT DETECTED
TETRAHYDROCANNABINOL: NOT DETECTED

## 2016-10-14 LAB — TYPE AND SCREEN
ABO/RH(D): O POS
ANTIBODY SCREEN: NEGATIVE

## 2016-10-14 LAB — ABO/RH: ABO/RH(D): O POS

## 2016-10-14 LAB — MRSA PCR SCREENING: MRSA BY PCR: INVALID — AB

## 2016-10-14 LAB — TSH: TSH: 1.589 u[IU]/mL (ref 0.350–4.500)

## 2016-10-14 LAB — PHOSPHORUS
PHOSPHORUS: 3.4 mg/dL (ref 2.5–4.6)
PHOSPHORUS: 3.2 mg/dL (ref 2.5–4.6)

## 2016-10-14 MED ORDER — FOLIC ACID 5 MG/ML IJ SOLN
1.0000 mg | Freq: Every day | INTRAMUSCULAR | Status: DC
  Filled 2016-10-14 (×2): qty 0.2

## 2016-10-14 MED ORDER — GABAPENTIN 300 MG PO CAPS
300.0000 mg | ORAL_CAPSULE | Freq: Three times a day (TID) | ORAL | Status: DC
  Administered 2016-10-14 – 2016-10-17 (×10): 300 mg via ORAL
  Filled 2016-10-14 (×10): qty 1

## 2016-10-14 MED ORDER — TRAZODONE HCL 50 MG PO TABS
200.0000 mg | ORAL_TABLET | Freq: Every day | ORAL | Status: DC
  Administered 2016-10-14 – 2016-10-16 (×3): 200 mg via ORAL
  Filled 2016-10-14 (×3): qty 4

## 2016-10-14 MED ORDER — ACETAMINOPHEN 650 MG RE SUPP: 650.0000 mg | Freq: Four times a day (QID) | RECTAL | Status: DC | PRN

## 2016-10-14 MED ORDER — SODIUM CHLORIDE 0.9 % IV SOLN: INTRAVENOUS | Status: AC

## 2016-10-14 MED ORDER — ONDANSETRON HCL 4 MG PO TABS: 4.0000 mg | ORAL_TABLET | Freq: Four times a day (QID) | ORAL | Status: DC | PRN

## 2016-10-14 MED ORDER — ONDANSETRON HCL 4 MG/2ML IJ SOLN
4.0000 mg | Freq: Four times a day (QID) | INTRAMUSCULAR | Status: DC | PRN
  Administered 2016-10-14: 4 mg via INTRAVENOUS
  Filled 2016-10-14 (×2): qty 2

## 2016-10-14 MED ORDER — SODIUM CHLORIDE 0.9 % IV SOLN
INTRAVENOUS | Status: DC
  Administered 2016-10-14 (×2): via INTRAVENOUS

## 2016-10-14 MED ORDER — FOLIC ACID 1 MG PO TABS
1.0000 mg | ORAL_TABLET | Freq: Every day | ORAL | Status: DC
  Administered 2016-10-14 – 2016-10-17 (×4): 1 mg via ORAL
  Filled 2016-10-14 (×4): qty 1

## 2016-10-14 MED ORDER — CHLORDIAZEPOXIDE HCL 25 MG PO CAPS
50.0000 mg | ORAL_CAPSULE | Freq: Three times a day (TID) | ORAL | Status: AC
  Administered 2016-10-14 – 2016-10-15 (×6): 50 mg via ORAL
  Filled 2016-10-14 (×6): qty 2

## 2016-10-14 MED ORDER — CHLORDIAZEPOXIDE HCL 25 MG PO CAPS: 50.0000 mg | ORAL_CAPSULE | Freq: Three times a day (TID) | ORAL | Status: DC

## 2016-10-14 MED ORDER — OLANZAPINE 10 MG PO TABS
10.0000 mg | ORAL_TABLET | Freq: Every day | ORAL | Status: DC
  Administered 2016-10-14 – 2016-10-16 (×3): 10 mg via ORAL
  Filled 2016-10-14 (×3): qty 1

## 2016-10-14 MED ORDER — LORAZEPAM 2 MG/ML IJ SOLN
2.0000 mg | INTRAMUSCULAR | Status: DC | PRN
  Administered 2016-10-14 (×4): 2 mg via INTRAVENOUS
  Administered 2016-10-15: 3 mg via INTRAVENOUS
  Administered 2016-10-15: 2 mg via INTRAVENOUS
  Administered 2016-10-15 (×2): 3 mg via INTRAVENOUS
  Administered 2016-10-16 (×2): 2 mg via INTRAVENOUS
  Filled 2016-10-14: qty 1
  Filled 2016-10-14 (×2): qty 2
  Filled 2016-10-14 (×2): qty 1
  Filled 2016-10-14: qty 2
  Filled 2016-10-14 (×3): qty 1

## 2016-10-14 MED ORDER — SODIUM CHLORIDE 0.9% FLUSH
3.0000 mL | Freq: Two times a day (BID) | INTRAVENOUS | Status: DC
  Administered 2016-10-15 – 2016-10-17 (×5): 3 mL via INTRAVENOUS

## 2016-10-14 MED ORDER — ACETAMINOPHEN 325 MG PO TABS: 650.0000 mg | ORAL_TABLET | Freq: Four times a day (QID) | ORAL | Status: DC | PRN

## 2016-10-14 NOTE — Progress Notes (Signed)
[facility] hospital[facility]  Patient admitted after midnight see H&P for full details.  46 year old male with medical history of alcoholism, bipolar disorder admitted for alcohol withdrawal placed on CIWA protocol.  Patient seen and examined, report having auditory hallucination and headache. No other complaints, tolerating diet well. Patient report having a history of intubation due to alcohol withdrawal.  Physical exam Vital signs stable CIWA done by me 12  A&P Alcohol dependence, alcohol withdrawal Continue CIWA protocol Will add Librium protocol Advance diet as tolerated Continue to monitor Will check labs in AM   Rest per H&P   Latrelle Dodrill, MD

## 2016-10-14 NOTE — H&P (Signed)
Jordan Davidson NWG:[id number] DOB: October 04, 1970 DOA: 10/13/2016     PCP: Jacklynn Barnacle, NP   Outpatient Specialists: none Patient coming from: homeless    Chief Complaint: alcohol withdrawal  HPI: Jordan Davidson is a 46 y.o. male with medical history significant of  alcoholism, bipolar disorder, and homelessness     Presented with wish to be detoxed from alcohol, Hx of severe withdrawal in the past including seizures and DT.Marland Kitchen Reports he had a "seizure" today but was awake the whole time during the event with no post-ictal phase.  He is tremulous.   He drinks "1 to 1.5 of a fifth" and "4-5 beers" of 24 ounces each per day. Last detox treatment was 2 years ago. Denies any drug use. No SI/HI. States he wants to get back in [place], but can't go "until I get sobered up".  Regarding pertinent Chronic problems: Her recurrent admissions for alcohol intoxication. He reports occasional vomiting of blood.non  Recent   IN ER:  Temp (24hrs), Avg:98.9 F (37.2 C), Min:98.9 F (37.2 C), Max:98.9 F (37.2 C)      RR 2296% HR 103 BP 176/118 WBC 5.4 Hg 12.8 plt 40 at baseline Na 139 K 3.7 Cr 0.59 AST 161 Alt71 MAg 1.4 Following Medications were ordered in ER: Medications  thiamine (VITAMIN B-1) tablet 100 mg (not administered)    Or  thiamine (B-1) injection 100 mg (not administered)  LORazepam (ATIVAN) injection 0-4 mg (not administered)    Followed by  LORazepam (ATIVAN) injection 0-4 mg (not administered)  chlordiazePOXIDE (LIBRIUM) capsule 50 mg (50 mg Oral Given 10/13/16 2013)  magnesium oxide (MAG-OX) tablet 800 mg (800 mg Oral Given 10/13/16 2144)  sodium chloride 0.9 % bolus 1,000 mL (0 mLs Intravenous Stopped 10/13/16 2348)  LORazepam (ATIVAN) injection 2 mg (2 mg Intravenous Given 10/13/16 2208)  thiamine (B-1) injection 100 mg (100 mg Intravenous Given 10/13/16 2208)  folic acid (FOLVITE) tablet 1 mg (1 mg Oral Given 10/13/16 2208)  magnesium sulfate IVPB 2 g 50 mL (0 g  Intravenous Stopped 10/13/16 2341)      Hospitalist was called for admission for Alcohol withdrawal  Review of Systems:    Pertinent positives include: tremor, ? seizure  Constitutional:  No weight loss, night sweats, Fevers, chills, fatigue, weight loss  HEENT:  No headaches, Difficulty swallowing,Tooth/dental problems,Sore throat,  No sneezing, itching, ear ache, nasal congestion, post nasal drip,  Cardio-vascular:  No chest pain, Orthopnea, PND, anasarca, dizziness, palpitations.no Bilateral lower extremity swelling  GI:  No heartburn, indigestion, abdominal pain, nausea, vomiting, diarrhea, change in bowel habits, loss of appetite, melena, blood in stool, hematemesis Resp:  no shortness of breath at rest. No dyspnea on exertion, No excess mucus, no productive cough, No non-productive cough, No coughing up of blood.No change in color of mucus.No wheezing. Skin:  no rash or lesions. No jaundice GU:  no dysuria, change in color of urine, no urgency or frequency. No straining to urinate.  No flank pain.  Musculoskeletal:  No joint pain or no joint swelling. No decreased range of motion. No back pain.  Psych:  No change in mood or affect. No depression or anxiety. No memory loss.  Neuro: no localizing neurological complaints, no tingling, no weakness, no double vision, no gait abnormality, no slurred speech, no confusion  As per HPI otherwise 10 point review of systems negative.   Past Medical History: Past Medical History:  Diagnosis Date  . Alcohol abuse   . Alcoholism (  HCC)   . Bipolar 1 disorder, depressed (HCC)   . Depression   . Thrombocytopenia (HCC)    Past Surgical History:  Procedure Laterality Date  . NO PAST SURGERIES       Social History:  Ambulatory   Independently     reports that he has been smoking Cigarettes.  He has never used smokeless tobacco. He reports that he drinks alcohol. He reports that he does not use drugs.  Allergies:  No Known  Allergies     Family History:   Family History  Problem Relation Age of Onset  . Hypertension Mother   . Cancer Father   . Heart failure Father   . Alcoholism Father     Medications: Prior to Admission medications   Medication Sig Start Date End Date Taking? Authorizing Provider  gabapentin (NEURONTIN) 300 MG capsule Take 300 mg by mouth 3 (three) times daily.   Yes Historical Provider, MD  OLANZapine (ZYPREXA) 10 MG tablet Take 10 mg by mouth at bedtime.   Yes Historical Provider, MD  traZODone (DESYREL) 100 MG tablet Take 200 mg by mouth at bedtime.   Yes Historical Provider, MD  amphetamine-dextroamphetamine (ADDERALL) 20 MG tablet Take 20 mg by mouth 2 (two) times daily.    Historical Provider, MD    Physical Exam: Patient Vitals for the past 24 hrs:  BP Temp Temp src Pulse Resp SpO2 Height Weight  10/13/16 2157 (!) 176/118 - - (!) 103 (!) 22 96 % - -  10/13/16 2147 (!) 176/118 - - (!) 103 - - - -  10/13/16 1849 - - - - - - 6' (1.829 m) 81.6 kg (180 lb)  10/13/16 1802 (!) 157/117 98.9 F (37.2 C) Oral (!) 103 20 97 % - -    1. General:  in No Acute distress 2. Psychological: Alert and  Oriented 3. Head/ENT:    Dry Mucous Membranes                          Head Non traumatic, neck supple                           Poor Dentition 4. SKIN:   decreased Skin turgor,  Skin clean Dry and intact no rash 5. Heart: Regular rate and rhythm no  Murmur, Rub or gallop 6. Lungs:   no wheezes or crackles   7. Abdomen: Soft,  non-tender, Non distended 8. Lower extremities: no clubbing, cyanosis, or edema 9. Neurologically Grossly intact, moving all 4 extremities equally tremulus 10. MSK: Normal range of motion   body mass index is 24.41 kg/m.  Labs on Admission:   Labs on Admission: I have personally reviewed following labs and imaging studies  CBC:  Recent Labs Lab 10/13/16 1828  WBC 5.4  NEUTROABS 4.1  HGB 12.8*  HCT 36.7*  MCV 92.9  PLT 40*   Basic Metabolic  Panel:  Recent Labs Lab 10/13/16 1828  NA 139  K 3.7  CL 102  CO2 27  GLUCOSE 112*  BUN 12  CREATININE 0.59*  CALCIUM 9.2  MG 1.4*   GFR: Estimated Creatinine Clearance: 125.3 mL/min (A) (by C-G formula based on SCr of 0.59 mg/dL (L)). Liver Function Tests:  Recent Labs Lab 10/13/16 1828  AST 161*  ALT 71*  ALKPHOS 87  BILITOT 1.1  PROT 7.3  ALBUMIN 4.4   No results for input(s): LIPASE, AMYLASE in  the last 168 hours. No results for input(s): AMMONIA in the last 168 hours. Coagulation Profile: No results for input(s): INR, PROTIME in the last 168 hours. Cardiac Enzymes: No results for input(s): CKTOTAL, CKMB, CKMBINDEX, TROPONINI in the last 168 hours. BNP (last 3 results) No results for input(s): PROBNP in the last 8760 hours. HbA1C: No results for input(s): HGBA1C in the last 72 hours. CBG: No results for input(s): GLUCAP in the last 168 hours. Lipid Profile: No results for input(s): CHOL, HDL, LDLCALC, TRIG, CHOLHDL, LDLDIRECT in the last 72 hours. Thyroid Function Tests: No results for input(s): TSH, T4TOTAL, FREET4, T3FREE, THYROIDAB in the last 72 hours. Anemia Panel: No results for input(s): VITAMINB12, FOLATE, FERRITIN, TIBC, IRON, RETICCTPCT in the last 72 hours. Urine analysis:  Sepsis Labs: (procalcitonin:4,lacticidven:4) )No results found for this or any previous visit (from the past 240 hour(s)).      UA   ordered  Lab Results  Component Value Date   HGBA1C 5.7 (H) 02/15/2015    Estimated Creatinine Clearance: 125.3 mL/min (A) (by C-G formula based on SCr of 0.59 mg/dL (L)).  BNP (last 3 results) No results for input(s): PROBNP in the last 8760 hours.   ECG REPORT ordered  [facility] Weights   10/13/16 1849  Weight: 81.6 kg (180 lb)     Cultures: No results found for: SDES, SPECREQUEST, CULT, REPTSTATUS   Radiological Exams on Admission: No results found.  Chart has been reviewed    Assessment/Plan  46 y.o. male  with medical history significant of  alcoholism, bipolar disorder, and homelessness admitted for alcohol withdrWAL  Present on Admission: . Alcohol use disorder, severe, dependence (HCC) - Ordered social work consult patient would benefit from alcohol detox and rehabilitation if   still interested in quitting . Bipolar I disorder (HCC)- Continue home medications . Thrombocytopenia (HCC)- Most like is secondary to liver disease stable . Alcohol withdrawal - (HCC)Urine history of seizure disorder and severe DTs with me to step down initiate CIWA protocol . Hypomagnesemia - Replace Reported history of seizures will order seizure precautions and monitor for any seizure activity Reported remote history of hematemesis patient is at risk for portal gastropathy and/or varices would benefit from follow-up with GI as an outpatient since currently no active GI bleeding  Other plan as per orders.  DVT prophylaxis:  SCD      Code Status:  FULL CODE   Family Communication:   Family not  at  Bedside    Disposition Plan:   To rehab facility when medically cleared  (reportedly has a bed)                        Social Work                             Consults called: none    Admission status:  Inpatient    Level of care       SDU      I have spent a total of 56 min on this admission   Jordan Davidson 10/14/2016, 1:18 AM    [facility]  Pager [phone]   after 2 AM please page floor coverage PA If 7AM-7PM, please contact the day team taking care of the patient  [email or web address]  Password [email or web address]

## 2016-10-15 DIAGNOSIS — F1020 Alcohol dependence, uncomplicated: Secondary | ICD-10-CM

## 2016-10-15 DIAGNOSIS — D696 Thrombocytopenia, unspecified: Secondary | ICD-10-CM

## 2016-10-15 DIAGNOSIS — F10239 Alcohol dependence with withdrawal, unspecified: Secondary | ICD-10-CM

## 2016-10-15 NOTE — Progress Notes (Signed)
PROGRESS NOTE [facility]   Pinchos Topel   ZOX:[id number] DOB: 1971-05-16  DOA: 10/13/2016 PCP: Jacklynn Barnacle, NP   Brief Narrative:  46 year old male with medical history of alcoholism, bipolar disorder admitted for alcohol withdrawal placed on CIWA protocol  Subjective: Patient seen and examined, sleepy received 14 mg of IV ativan overnight. Have no complaints this AM  Afebrile, seizure free. Tolerating diet well   Assessment & Plan: Alcohol dependence, alcohol withdrawal, CIWA done by me this AM 4 Continue CIWA protocol Continue Librium  Will transfer out of SDU this PM if continues to be stable  Continue to monitor  Thrombocytopenia - alcohol induced No signs of over bleeding  Continue to monitor for now  Check CBC in AM   Bipolar disorder  On Zyprexa and Gabapentin    DVT prophylaxis: SCD's  Code Status: FULL  Family Communication: None at bedside  Disposition Plan: Home when patient outside of withdrawal window hopefully 48 hrs   Consultants:   None   Procedures:   None   Antimicrobials:  None    Objective: Vitals:   10/15/16 0600 10/15/16 0700 10/15/16 0800 10/15/16 0900  BP:   114/67   Pulse: 64 62 63 85  Resp: Temp:      TempSrc:      SpO2: 100% 100% 93% 97%  Weight:      Height:        Intake/Output Summary (Last 24 hours) at 10/15/16 0902 Last data filed at 10/15/16 0830  Gross per 24 hour  Intake              200 ml  Output             1900 ml  Net            -1700 ml   Filed Weights   10/13/16 1849  Weight: 81.6 kg (180 lb)    Examination:  General exam: mild anxious  Respiratory system: Clear to auscultation. No wheezes,crackle or rhonchi Cardiovascular system: S1S2 no murmurs  Gastrointestinal system: Abdomen is nondistended, soft and nontender.  Central nervous system: Alert and oriented. No focal neurological deficits. Extremities: No pedal edema. Skin: No rashes, lesions or  ulcers Psychiatry: Mood depressed    Data Reviewed: I have personally reviewed following labs and imaging studies  CBC:  Recent Labs Lab 10/13/16 1828 10/14/16 1024  WBC 5.4 3.9*  NEUTROABS 4.1  --   HGB 12.8* 12.3*  HCT 36.7* 35.4*  MCV 92.9 93.4  PLT 40* 38*   Basic Metabolic Panel:  Recent Labs Lab 10/13/16 1828 10/14/16 1024  NA 139 137  K 3.7 3.5  CL 102 102  CO2 27 26  GLUCOSE 112* 95  BUN 12 9  CREATININE 0.59* 0.57*  CALCIUM 9.2 8.7*  MG 1.4* 2.0  PHOS 3.4 3.2   GFR: Estimated Creatinine Clearance: 125.3 mL/min (A) (by C-G formula based on SCr of 0.57 mg/dL (L)). Liver Function Tests:  Recent Labs Lab 10/13/16 1828 10/14/16 1024  AST 161* 116*  ALT 71* 56  ALKPHOS 87 78  BILITOT 1.1 1.1  PROT 7.3 6.4*  ALBUMIN 4.4 3.7   No results for input(s): LIPASE, AMYLASE in the last 168 hours. No results for input(s): AMMONIA in the last 168 hours. Coagulation Profile:  Recent Labs Lab 10/14/16 0051 10/14/16 1024  INR 0.83 0.98   Cardiac Enzymes: No results for input(s): CKTOTAL, CKMB, CKMBINDEX, TROPONINI in the last 168 hours. BNP (  last 3 results) No results for input(s): PROBNP in the last 8760 hours. HbA1C: No results for input(s): HGBA1C in the last 72 hours. CBG: No results for input(s): GLUCAP in the last 168 hours. Lipid Profile: No results for input(s): CHOL, HDL, LDLCALC, TRIG, CHOLHDL, LDLDIRECT in the last 72 hours. Thyroid Function Tests:  Recent Labs  10/14/16 0940  TSH 1.589   Anemia Panel: No results for input(s): VITAMINB12, FOLATE, FERRITIN, TIBC, IRON, RETICCTPCT in the last 72 hours. Sepsis Labs: No results for input(s): PROCALCITON, LATICACIDVEN in the last 168 hours.  Recent Results (from the past 240 hour(s))  MRSA PCR Screening     Status: Abnormal   Collection Time: 10/14/16  1:17 PM  Result Value Ref Range Status   MRSA by PCR INVALID RESULTS, SPECIMEN SENT FOR CULTURE (A) NEGATIVE Final    Comment: A  CHAVEZ AT 1454 ON 04.21.2018 BY NBROOKS        The GeneXpert MRSA Assay (FDA approved for NASAL specimens only), is one component of a comprehensive MRSA colonization surveillance program. It is not intended to diagnose MRSA infection nor to guide or monitor treatment for MRSA infections.          Radiology Studies: No results found.    Scheduled Meds: . chlordiazePOXIDE  50 mg Oral TID  . folic acid  1 mg Oral Daily  . gabapentin  300 mg Oral TID  . OLANZapine  10 mg Oral QHS  . sodium chloride flush  3 mL Intravenous Q12H  . thiamine  100 mg Oral Daily  . traZODone  200 mg Oral QHS   Continuous Infusions:   LOS: 1 day     Latrelle Dodrill, MD Pager: Text Page via [email or web address]  [phone]  If 7PM-7AM, please contact night-coverage [email or web address] Password [email or web address] 10/15/2016, 9:02 AM

## 2016-10-16 DIAGNOSIS — F319 Bipolar disorder, unspecified: Secondary | ICD-10-CM

## 2016-10-16 LAB — CBC
HCT: 36 % — ABNORMAL LOW (ref 39.0–52.0)
HEMOGLOBIN: 12.1 g/dL — AB (ref 13.0–17.0)
MCH: 32 pg (ref 26.0–34.0)
MCHC: 33.6 g/dL (ref 30.0–36.0)
MCV: 95.2 fL (ref 78.0–100.0)
Platelets: 58 10*3/uL — ABNORMAL LOW (ref 150–400)
RBC: 3.78 MIL/uL — AB (ref 4.22–5.81)
RDW: 15.9 % — ABNORMAL HIGH (ref 11.5–15.5)
WBC: 4.9 10*3/uL (ref 4.0–10.5)

## 2016-10-16 LAB — MRSA CULTURE: Culture: NOT DETECTED

## 2016-10-16 MED ORDER — CHLORDIAZEPOXIDE HCL 25 MG PO CAPS
25.0000 mg | ORAL_CAPSULE | Freq: Four times a day (QID) | ORAL | Status: AC
  Administered 2016-10-16 (×4): 25 mg via ORAL
  Filled 2016-10-16 (×4): qty 1

## 2016-10-16 MED ORDER — LORAZEPAM 1 MG PO TABS
2.0000 mg | ORAL_TABLET | ORAL | Status: DC | PRN
  Administered 2016-10-16 – 2016-10-17 (×2): 2 mg via ORAL
  Filled 2016-10-16 (×2): qty 2

## 2016-10-16 MED ORDER — CHLORDIAZEPOXIDE HCL 25 MG PO CAPS
25.0000 mg | ORAL_CAPSULE | Freq: Three times a day (TID) | ORAL | Status: DC
  Administered 2016-10-17: 25 mg via ORAL
  Filled 2016-10-16: qty 1

## 2016-10-16 NOTE — Clinical Social Work Note (Addendum)
Clinical Social Work Assessment  Patient Details  Name: Jordan Davidson MRN: [id number] Date of Birth: 1970-09-23  Date of referral:  10/16/16               Reason for consult:  Substance Use/ETOH Abuse                Permission sought to share information with:  Facility Art therapist granted to share information::  Yes, Verbal Permission Granted  Name::        Agency::     Relationship::     Contact Information:     Housing/Transportation Living arrangements for the past 2 months:   ([facility]) Source of Information:  Patient Patient Interpreter Needed:  None Criminal Activity/Legal Involvement Pertinent to Current Situation/Hospitalization:    Significant Relationships:  Friend, Other Family Members Lives with:  Facility Resident Do you feel safe going back to the place where you live?  Yes Need for family participation in patient care:  No (Coment)  Care giving concerns:  None listed by pt/family    Social Worker assessment / plan:  CSW met with pt and confirmed pt's plan to be discharged back to "[facility]" to live at discharge.  CSW provided active listening and validated pt's concerns.  Pt has been homeless prior to being admitted to [facility].  CSW met with the pt and provided active listening and validated patient's concerns.  CSW provided pt with a meeting schedule for area Alcoholics Anonymous and Narcotics Anonymous 12-Step meetings.  CSW provided education to the pt as to the efficacy of 12-step programs for community support for those needing support in addition to or other than outpatient treatment.  CSW provided [facility] [place] information.  Pt appreciated CSW's efforts and thanked the CSW.   Employment status:  Unemployed Forensic scientist:  Self Pay (Medicaid Pending) PT Recommendations:  Not assessed at this time Information / Referral to community resources:     Patient/Family's Response to care:   Patient alert and oriented.  Patient agreeable to plan.  Pt's mother supportive and strongly involved in pt.'s care.  Pt pleasant and appreciated CSW intervention.    Patient/Family's Understanding of and Emotional Response to Diagnosis, Current Treatment, and Prognosis:  Still assessing   Emotional Assessment Appearance:  Appears stated age Attitude/Demeanor/Rapport:    Affect (typically observed):  Accepting, Calm, Pleasant Orientation:  Oriented to Self, Oriented to Place, Oriented to Situation, Oriented to  Time Alcohol / Substance use:    Psych involvement (Current and /or in the community):     Discharge Needs  Concerns to be addressed:  Substance Abuse Concerns, Homelessness Readmission within the last 30 days:  No Current discharge risk:  None Barriers to Discharge:  No Barriers Identified   Claudine Mouton, LCSWA 10/16/2016, 5:32 PM

## 2016-10-16 NOTE — Evaluation (Signed)
Occupational Therapy Evaluation Patient Details Name: Jordan Davidson MRN: [id number] DOB: 1971/05/17 Today's Date: 10/16/2016    History of Present Illness 46 year old male with medical history of alcoholism, bipolar disorder admitted for alcohol withdrawal placed on CIWA protocol   Clinical Impression   Pt admitted for detox. Pt currently with functional limitations due to the deficits listed below (see OT Problem List).  Pt will benefit from skilled OT to increase their safety and independence with ADL and functional mobility for ADL to facilitate discharge to venue listed below.      Follow Up Recommendations  SNF;Home health OT;Supervision/Assistance - 24 hour;Other (comment) (depending on progress)    Equipment Recommendations  None recommended by OT       Precautions / Restrictions Precautions Precautions: Fall      Mobility Bed Mobility Overal bed mobility: Needs Assistance Bed Mobility: Supine to Sit;Sit to Supine     Supine to sit: Min assist Sit to supine: Min assist      Transfers Overall transfer level: Needs assistance   Transfers: Sit to/from Stand Sit to Stand: Mod assist         General transfer comment: pt with posterior lean in standing and plopped back on bed        ADL either performed or assessed with clinical judgement   ADL Overall ADL's : Needs assistance/impaired Eating/Feeding: Set up;Sitting   Grooming: Set up;Sitting   Upper Body Bathing: Minimal assistance;Sitting   Lower Body Bathing: Moderate assistance;Sit to/from stand;Cueing for safety;Cueing for sequencing;Cueing for compensatory techniques Lower Body Bathing Details (indicate cue type and reason): posterior lean in standing Upper Body Dressing : Set up;Sitting   Lower Body Dressing: Moderate assistance;Sit to/from stand;Cueing for safety;Cueing for sequencing;Cueing for compensatory techniques   Toilet Transfer: Moderate assistance;RW Toilet Transfer Details  (indicate cue type and reason): sit to stand Toileting- Architect and Hygiene: Sit to/from stand;Moderate assistance                            Pertinent Vitals/Pain Pain Assessment: No/denies pain     Hand Dominance     Extremity/Trunk Assessment Upper Extremity Assessment Upper Extremity Assessment: Overall WFL for tasks assessed           Communication Communication Communication: No difficulties   Cognition Arousal/Alertness: Awake/alert Behavior During Therapy: Flat affect Overall Cognitive Status: Within Functional Limits for tasks assessed                                                Home Living Family/patient expects to be discharged to:: Shelter/Homeless                                        Prior Functioning/Environment Level of Independence: Independent                 OT Problem List: Decreased strength;Decreased activity tolerance;Decreased safety awareness;Decreased knowledge of use of DME or AE      OT Treatment/Interventions: Self-care/ADL training;DME and/or AE instruction;Patient/family education;Therapeutic activities    OT Goals(Current goals can be found in the care plan section) Acute Rehab OT Goals Patient Stated Goal: get stonger OT Goal Formulation: With patient Time For Goal Achievement: 10/23/16 Potential to Achieve  Goals: Good  OT Frequency: Min 2X/week   Barriers to D/C: Decreased caregiver support             End of Session Nurse Communication: Mobility status  Activity Tolerance: Patient limited by fatigue Patient left: in bed;with call bell/phone within reach  OT Visit Diagnosis: Unsteadiness on feet (R26.81);Muscle weakness (generalized) (M62.81)                Time: 1135-1150 OT Time Calculation (min): 15 min Charges:  OT General Charges $OT Visit: 1 Procedure OT Evaluation $OT Eval Moderate Complexity: 1 Procedure G-Codes:     Lise Auer,  OT [phone]  Einar Crow D 10/16/2016, 1:29 PM

## 2016-10-16 NOTE — Evaluation (Signed)
Physical Therapy Evaluation Patient Details Name: Jordan Davidson MRN: [id number] DOB: [birth year] Today's Date: 10/16/2016   History of Present Illness  46 year old male with medical history of alcoholism, bipolar disorder admitted for alcohol withdrawal , seizures, placed on CIWA protocol  Clinical Impression  The patient ambulated with Rw and min assistance. Does require RW for balance currently. DC plan unknown. Pt admitted with above diagnosis. Pt currently with functional limitations due to the deficits listed below (see PT Problem List). Pt will benefit from skilled PT to increase their independence and safety with mobility to allow discharge to the venue listed below.       Follow Up Recommendations No PT follow up (unsure)    Equipment Recommendations   (tbd)    Recommendations for Other Services       Precautions / Restrictions Precautions Precautions: Fall Precaution Comments: Sz      Mobility  Bed Mobility Overal bed mobility: Needs Assistance Bed Mobility: Supine to Sit;Sit to Supine     Supine to sit: Supervision Sit to supine: Supervision   General bed mobility comments: no assist required  Transfers Overall transfer level: Needs assistance Equipment used: Rolling walker (2 wheeled) Transfers: Sit to/from Stand Sit to Stand: Min assist         General transfer comment: patient pulled up on RW, was not leaning posteriorly  Ambulation/Gait Ambulation/Gait assistance: Min assist Ambulation Distance (Feet): 250 Feet Assistive device: Rolling walker (2 wheeled) Gait Pattern/deviations: Step-to pattern;Step-through pattern;Wide base of support;Trunk flexed;Decreased stride length   Gait velocity interpretation: <1.8 ft/sec, indicative of risk for recurrent falls General Gait Details: gait is slow, slower in turning around. cues to get closer to the bed prior to sitting.  Stairs            Wheelchair Mobility    Modified Rankin (Stroke  Patients Only)       Balance           Standing balance support: During functional activity;No upper extremity supported Standing balance-Leahy Scale: Poor                               Pertinent Vitals/Pain Pain Assessment: No/denies pain    Home Living Family/patient expects to be discharged to:: Shelter/Homeless                 Additional Comments: wants to go to halfway house    Prior Function Level of Independence: Independent               Hand Dominance        Extremity/Trunk Assessment   Upper Extremity Assessment Upper Extremity Assessment: Defer to OT evaluation    Lower Extremity Assessment Lower Extremity Assessment: Generalized weakness    Cervical / Trunk Assessment Cervical / Trunk Assessment: Normal  Communication   Communication: No difficulties  Cognition Arousal/Alertness: Awake/alert Behavior During Therapy: Flat affect Overall Cognitive Status: Impaired/Different from baseline Area of Impairment: Orientation                 Orientation Level: Time             General Comments: date      General Comments      Exercises     Assessment/Plan    PT Assessment Patient needs continued PT services  PT Problem List Decreased strength;Decreased balance;Decreased mobility;Decreased activity tolerance;Decreased safety awareness;Decreased cognition       PT Treatment Interventions DME  instruction;Gait training;Functional mobility training;Therapeutic activities;Therapeutic exercise;Balance training;Patient/family education    PT Goals (Current goals can be found in the Care Plan section)  Acute Rehab PT Goals Patient Stated Goal: go to halfway house PT Goal Formulation: With patient Time For Goal Achievement: 10/30/16 Potential to Achieve Goals: Fair    Frequency Min 3X/week   Barriers to discharge Inaccessible home environment;Decreased caregiver support      Co-evaluation                End of Session Equipment Utilized During Treatment: Gait belt Activity Tolerance: Patient tolerated treatment well Patient left: in bed;with call bell/phone within reach;with bed alarm set Nurse Communication: Mobility status PT Visit Diagnosis: Unsteadiness on feet (R26.81)    Time: 1413-1440 PT Time Calculation (min) (ACUTE ONLY): 27 min   Charges:   PT Evaluation $PT Eval Low Complexity: 1 Procedure PT Treatments $Gait Training: 8-22 mins   PT G CodesBlanchard Kelch PT [phone]   Rada Hay 10/16/2016, 2:51 PM

## 2016-10-16 NOTE — Progress Notes (Signed)
PROGRESS NOTE [facility]   Jordan Davidson   ZOX:[id number] DOB: 05-31-1971  DOA: 10/13/2016 PCP: Jacklynn Barnacle, NP   Brief Narrative:  46 year old male with medical history of alcoholism, bipolar disorder admitted for alcohol withdrawal placed on CIWA protocol  Subjective: Patient seen and examined required 10 mg Ativan overnight. No new complaints. Tolerating diet well.  Assessment & Plan: Alcohol dependence, alcohol withdrawal, CIWA done by me this AM 7 Continue CIWA protocol change ativan  Continue Librium taper  Continue to monitor We'll keep overnight given patient required some IV Ativan 10 mg total.  Patient willing to go to outpatient rehabilitation for alcohol, will discharge in the morning.  Thrombocytopenia - alcohol induced No signs of over bleeding  Continue to monitor for now  Check CBC in AM   Bipolar disorder  On Zyprexa and Gabapentin   DVT prophylaxis: SCD's  Code Status: FULL  Family Communication: None at bedside  Disposition Plan: Expected discharge tomorrow in the morning  Consultants:   None   Procedures:   None   Antimicrobials:  None    Objective: Vitals:   10/15/16 2222 10/16/16 0438 10/16/16 1209 10/16/16 1610  BP: (!) 141/89 (!) 149/83 102/63 117/64  Pulse: 67 88 73 77  Resp:  Temp: 97.6 F (36.4 C) 97.6 F (36.4 C) 97.7 F (36.5 C) 97.6 F (36.4 C)  TempSrc: Oral Oral Axillary Oral  SpO2: 97% 96% 94% 97%  Weight: 78 kg (171 lb 15.3 oz)     Height: 6' (1.829 m)       Intake/Output Summary (Last 24 hours) at 10/16/16 1809 Last data filed at 10/16/16 1300  Gross per 24 hour  Intake              160 ml  Output              300 ml  Net             -140 ml   Filed Weights   10/13/16 1849 10/15/16 2222  Weight: 81.6 kg (180 lb) 78 kg (171 lb 15.3 oz)    Examination:  General exam: Some anxiety  Respiratory system: CTA Cardiovascular system: S1-S2 regular rhythm Gastrointestinal system:  Abdomen soft Central nervous system: Alert and oriented Extremities: No lower extremity edema Psychiatry: Mood depressed   Data Reviewed: I have personally reviewed following labs and imaging studies  CBC:  Recent Labs Lab 10/13/16 1828 10/14/16 1024 10/16/16 0510  WBC 5.4 3.9* 4.9  NEUTROABS 4.1  --   --   HGB 12.8* 12.3* 12.1*  HCT 36.7* 35.4* 36.0*  MCV 92.9 93.4 95.2  PLT 40* 38* 58*   Basic Metabolic Panel:  Recent Labs Lab 10/13/16 1828 10/14/16 1024  NA 139 137  K 3.7 3.5  CL 102 102  CO2 27 26  GLUCOSE 112* 95  BUN 12 9  CREATININE 0.59* 0.57*  CALCIUM 9.2 8.7*  MG 1.4* 2.0  PHOS 3.4 3.2   GFR: Estimated Creatinine Clearance: 125.3 mL/min (A) (by C-G formula based on SCr of 0.57 mg/dL (L)). Liver Function Tests:  Recent Labs Lab 10/13/16 1828 10/14/16 1024  AST 161* 116*  ALT 71* 56  ALKPHOS 87 78  BILITOT 1.1 1.1  PROT 7.3 6.4*  ALBUMIN 4.4 3.7   No results for input(s): LIPASE, AMYLASE in the last 168 hours. No results for input(s): AMMONIA in the last 168 hours. Coagulation Profile:  Recent Labs Lab 10/14/16 0051 10/14/16 1024  INR 0.83 0.98   Cardiac Enzymes: No results for input(s): CKTOTAL, CKMB, CKMBINDEX, TROPONINI in the last 168 hours. BNP (last 3 results) No results for input(s): PROBNP in the last 8760 hours. HbA1C: No results for input(s): HGBA1C in the last 72 hours. CBG: No results for input(s): GLUCAP in the last 168 hours. Lipid Profile: No results for input(s): CHOL, HDL, LDLCALC, TRIG, CHOLHDL, LDLDIRECT in the last 72 hours. Thyroid Function Tests:  Recent Labs  10/14/16 0940  TSH 1.589   Anemia Panel: No results for input(s): VITAMINB12, FOLATE, FERRITIN, TIBC, IRON, RETICCTPCT in the last 72 hours. Sepsis Labs: No results for input(s): PROCALCITON, LATICACIDVEN in the last 168 hours.  Recent Results (from the past 240 hour(s))  MRSA culture     Status: None   Collection Time: 10/14/16 12:00 PM    Result Value Ref Range Status   Specimen Description NOSE  Final   Special Requests NONE  Final   Culture   Final    NO MRSA DETECTED Performed at [facility], [address] [place], [place] [address]    Report Status 10/16/2016 FINAL  Final  MRSA PCR Screening     Status: Abnormal   Collection Time: 10/14/16  1:17 PM  Result Value Ref Range Status   MRSA by PCR INVALID RESULTS, SPECIMEN SENT FOR CULTURE (A) NEGATIVE Final    Comment: A CHAVEZ AT 1454 ON 04.21.2018 BY NBROOKS        The GeneXpert MRSA Assay (FDA approved for NASAL specimens only), is one component of a comprehensive MRSA colonization surveillance program. It is not intended to diagnose MRSA infection nor to guide or monitor treatment for MRSA infections.      Radiology Studies: No results found.   Scheduled Meds: . chlordiazePOXIDE  25 mg Oral QID   Followed by  . [START ON 10/17/2016] chlordiazePOXIDE  25 mg Oral TID  . folic acid  1 mg Oral Daily  . gabapentin  300 mg Oral TID  . OLANZapine  10 mg Oral QHS  . sodium chloride flush  3 mL Intravenous Q12H  . thiamine  100 mg Oral Daily  . traZODone  200 mg Oral QHS   Continuous Infusions:   LOS: 2 days    Latrelle Dodrill, MD Pager: Text Page via [email or web address]  [phone]  If 7PM-7AM, please contact night-coverage [email or web address] Password [facility] 10/16/2016, 6:09 PM

## 2016-10-17 MED ORDER — LORAZEPAM 0.5 MG PO TABS: 0.5000 mg | ORAL_TABLET | Freq: Three times a day (TID) | ORAL | 0 refills | Status: DC | PRN

## 2016-10-17 MED ORDER — THIAMINE HCL 100 MG PO TABS: 100.0000 mg | ORAL_TABLET | Freq: Every day | ORAL | 0 refills | Status: DC

## 2016-10-17 MED ORDER — FOLIC ACID 1 MG PO TABS: 1.0000 mg | ORAL_TABLET | Freq: Every day | ORAL | 0 refills | Status: DC

## 2016-10-17 MED ORDER — CHLORDIAZEPOXIDE HCL 25 MG PO CAPS: ORAL_CAPSULE | ORAL | 0 refills | Status: DC

## 2016-10-17 NOTE — Discharge Summary (Signed)
Physician Discharge Summary  Jordan Davidson  ZOX:[id number]  DOB: Feb 26, 1971  DOA: 10/13/2016 PCP: Jacklynn Barnacle, NP  Admit date: 10/13/2016 Discharge date: 10/17/2016  Admitted From: Homeless Disposition:  Shelter   Recommendations for Outpatient Follow-up:  1. Follow up with PCP in 1-2 weeks 2. Please obtain BMP/CBC in one week    Discharge Condition: Stable  CODE STATUS: FULL  Diet recommendation: Heart Healthy   Brief/Interim Summary: 46 year old male with medical history of alcoholism, bipolar disorder admitted to SDU for alcohol withdrawal placed on CIWAprotocol. Patient was placed on Librium taper which clinically help and improved patient symptoms. Patient clinically stable, tolerating diet, and auembulated with PT with no issues and no further recommendations. Social worker was consulted, as patient interested on detox. Patient will be discharged on Librium taper and back to "[facility] Social worker provided patient with a list of AA meeting schedules in the area.   Discharge Diagnoses/Hospital Course:  Alcohol dependence, alcohol withdrawal, CIWA done by me this AM 7 CIWA protocol was started, patient received multiple doses of IV ativan  Continue Librium taper x 4 more days - prescription given, advised to don't drink while taking this medication, he verbalizes understanding  Patient willing to go to outpatient rehabilitation for alcohol. Social worker   Thrombocytopenia - alcohol induced No signs of over bleeding  Recommend to monitor CBC in 1 wwek   Bipolar disorder  On Zyprexa and Gabapentin   All other chronic medical condition were stable during the hospitalization.  Patient was seen by physical therapy with no further recommendation. On the day of the discharge the patient's vitals were stable, and no other acute medical condition were reported by patient. Patient was felt safe to be discharge to home.  Discharge  Instructions  You were cared for by a hospitalist during your hospital stay. If you have any questions about your discharge medications or the care you received while you were in the hospital after you are discharged, you can call the unit and asked to speak with the hospitalist on call if the hospitalist that took care of you is not available. Once you are discharged, your primary care physician will handle any further medical issues. Please note that NO REFILLS for any discharge medications will be authorized once you are discharged, as it is imperative that you return to your primary care physician (or establish a relationship with a primary care physician if you do not have one) for your aftercare needs so that they can reassess your need for medications and monitor your lab values.  Discharge Instructions    Call MD for:  difficulty breathing, headache or visual disturbances    Complete by:  As directed    Call MD for:  extreme fatigue    Complete by:  As directed    Call MD for:  hives    Complete by:  As directed    Call MD for:  persistant dizziness or light-headedness    Complete by:  As directed    Call MD for:  persistant nausea and vomiting    Complete by:  As directed    Call MD for:  redness, tenderness, or signs of infection (pain, swelling, redness, odor or green/yellow discharge around incision site)    Complete by:  As directed    Call MD for:  severe uncontrolled pain    Complete by:  As directed    Call MD for:  temperature >100.4    Complete  by:  As directed    Diet - low sodium heart healthy    Complete by:  As directed    Increase activity slowly    Complete by:  As directed      Allergies as of 10/17/2016   No Known Allergies     Medication List    TAKE these medications   amphetamine-dextroamphetamine 20 MG tablet Commonly known as:  ADDERALL Take 20 mg by mouth 2 (two) times daily.   chlordiazePOXIDE 25 MG capsule Commonly known as:  LIBRIUM Take 1  tablet TID x 1 day Take 1 tablet BID x 1 day  Take 1 tablet daily for 2 days   folic acid 1 MG tablet Commonly known as:  FOLVITE Take 1 tablet (1 mg total) by mouth daily.   gabapentin 300 MG capsule Commonly known as:  NEURONTIN Take 300 mg by mouth 3 (three) times daily.   LORazepam 0.5 MG tablet Commonly known as:  ATIVAN Take 1 tablet (0.5 mg total) by mouth every 8 (eight) hours as needed for anxiety.   OLANZapine 10 MG tablet Commonly known as:  ZYPREXA Take 10 mg by mouth at bedtime.   thiamine 100 MG tablet Take 1 tablet (100 mg total) by mouth daily. Start taking on:  10/18/2016   traZODone 100 MG tablet Commonly known as:  DESYREL Take 200 mg by mouth at bedtime.      Follow-up Information    PLACEY,MARY H, NP. Schedule an appointment as soon as possible for a visit in 1 week(s).   Contact information: [address] [place] [address] [phone]          No Known Allergies  Consultations:  None    Procedures/Studies: Ct Head Wo Contrast  Result Date: 09/25/2016 CLINICAL DATA:  Headache and confusion with neck pain. Questionable fall EXAM: CT HEAD WITHOUT CONTRAST CT CERVICAL SPINE WITHOUT CONTRAST TECHNIQUE: Multidetector CT imaging of the head and cervical spine was performed following the standard protocol without intravenous contrast. Multiplanar CT image reconstructions of the cervical spine were also generated. COMPARISON:  Cervical spine CT April 28, 2016; head CT August 20, 2016 FINDINGS: CT HEAD FINDINGS Brain: There is slight generalized atrophy for age with somewhat greater cerebellar atrophy than elsewhere, stable. There is no intracranial mass, hemorrhage, extra-axial fluid collection, or midline shift. No focal gray-white compartment lesions are evident. No acute infarct appreciable. Vascular: No hyperdense vessel evident. There is a punctate focus of calcification in the right carotid siphon. Skull: Bony calvarium appears intact.  Sinuses/Orbits: There is extensive opacification throughout the maxillary antra bilaterally with air-fluid level in the left maxillary antrum. There appears to be polypoid change in both nares. There is extensive ethmoid sinus disease bilaterally. Other visualized paranasal sinuses are clear. No intraorbital lesions are evident. Other: Mastoid air cells are clear. CT CERVICAL SPINE FINDINGS Alignment: There is slight cervical levoscoliosis. No fracture evident. Skull base and vertebrae: Skull base and craniocervical junction regions appear normal. No evident fracture. No blastic or lytic bone lesions evident. Soft tissues and spinal canal: Prevertebral soft tissues and predental space regions are normal. No paraspinous lesions. No cord or canal hematoma. No high-grade spinal stenosis. Disc levels: There is moderate disc space narrowing at C5-6 and C6-7. There are prominent anterior osteophytes at C5, C6, and C7. There is facet hypertrophy at multiple levels. There is facet hypertrophy at several levels. No disc extrusion. Upper chest: Visualized lung apices are clear. Other: There is calcification in both carotid  arteries. IMPRESSION: CT head: Slight atrophy with somewhat greater cerebellar atrophy than elsewhere. No intracranial mass, hemorrhage, or extra-axial fluid collection. Gray-white compartments are normal. Extensive paranasal sinus disease noted, with an air-fluid level in the left maxillary antrum. CT cervical spine: No fracture or spondylolisthesis. Osteoarthritic change noted at several levels. Areas of carotid artery calcification noted bilaterally. Electronically Signed   By: Bretta Bang III M.D.   On: 09/25/2016 09:58   Ct Cervical Spine Wo Contrast  Result Date: 09/25/2016 CLINICAL DATA:  Headache and confusion with neck pain. Questionable fall EXAM: CT HEAD WITHOUT CONTRAST CT CERVICAL SPINE WITHOUT CONTRAST TECHNIQUE: Multidetector CT imaging of the head and cervical spine was performed  following the standard protocol without intravenous contrast. Multiplanar CT image reconstructions of the cervical spine were also generated. COMPARISON:  Cervical spine CT April 28, 2016; head CT August 20, 2016 FINDINGS: CT HEAD FINDINGS Brain: There is slight generalized atrophy for age with somewhat greater cerebellar atrophy than elsewhere, stable. There is no intracranial mass, hemorrhage, extra-axial fluid collection, or midline shift. No focal gray-white compartment lesions are evident. No acute infarct appreciable. Vascular: No hyperdense vessel evident. There is a punctate focus of calcification in the right carotid siphon. Skull: Bony calvarium appears intact. Sinuses/Orbits: There is extensive opacification throughout the maxillary antra bilaterally with air-fluid level in the left maxillary antrum. There appears to be polypoid change in both nares. There is extensive ethmoid sinus disease bilaterally. Other visualized paranasal sinuses are clear. No intraorbital lesions are evident. Other: Mastoid air cells are clear. CT CERVICAL SPINE FINDINGS Alignment: There is slight cervical levoscoliosis. No fracture evident. Skull base and vertebrae: Skull base and craniocervical junction regions appear normal. No evident fracture. No blastic or lytic bone lesions evident. Soft tissues and spinal canal: Prevertebral soft tissues and predental space regions are normal. No paraspinous lesions. No cord or canal hematoma. No high-grade spinal stenosis. Disc levels: There is moderate disc space narrowing at C5-6 and C6-7. There are prominent anterior osteophytes at C5, C6, and C7. There is facet hypertrophy at multiple levels. There is facet hypertrophy at several levels. No disc extrusion. Upper chest: Visualized lung apices are clear. Other: There is calcification in both carotid arteries. IMPRESSION: CT head: Slight atrophy with somewhat greater cerebellar atrophy than elsewhere. No intracranial mass,  hemorrhage, or extra-axial fluid collection. Gray-white compartments are normal. Extensive paranasal sinus disease noted, with an air-fluid level in the left maxillary antrum. CT cervical spine: No fracture or spondylolisthesis. Osteoarthritic change noted at several levels. Areas of carotid artery calcification noted bilaterally. Electronically Signed   By: Bretta Bang III M.D.   On: 09/25/2016 09:58   Dg Hand Complete Right  Result Date: 09/23/2016 CLINICAL DATA:  Initial evaluation for acute trauma, fall. EXAM: RIGHT HAND - COMPLETE 3+ VIEW COMPARISON:  None. FINDINGS: No acute fracture dislocation. Joint spaces well maintained. Mild irregularity at the midshaft of the right second metacarpal likely related to remotely healed trauma. Osseous mineralization normal. No soft tissue abnormality. IMPRESSION: No acute osseous abnormality about the right hand. Electronically Signed   By: Rise Mu M.D.   On: 09/23/2016 21:16     Discharge Exam: Vitals:   10/16/16 1610 10/17/16 0500  BP: 117/64 120/68  Pulse: 77 73  Resp: 18 18  Temp: 97.6 F (36.4 C) 97.8 F (36.6 C)   Vitals:   10/16/16 0438 10/16/16 1209 10/16/16 1610 10/17/16 0500  BP: (!) 149/83 102/63 117/64 120/68  Pulse: 88 73 77 73  Resp: Temp: 97.6 F (36.4 C) 97.7 F (36.5 C) 97.6 F (36.4 C) 97.8 F (36.6 C)  TempSrc: Oral Axillary Oral Oral  SpO2: 96% 94% 97% 97%  Weight:      Height:        General: Pt is alert, awake, not in acute distress Cardiovascular: RRR, S1/S2 +, no rubs, no gallops Respiratory: CTA bilaterally, no wheezing, no rhonchi Abdominal: Soft, NT, ND, bowel sounds + Extremities: no edema, no cyanosis   The results of significant diagnostics from this hospitalization (including imaging, microbiology, ancillary and laboratory) are listed below for reference.     Microbiology: Recent Results (from the past 240 hour(s))  MRSA culture     Status: None   Collection Time:  10/14/16 12:00 PM  Result Value Ref Range Status   Specimen Description NOSE  Final   Special Requests NONE  Final   Culture   Final    NO MRSA DETECTED Performed at [facility], [address], [place], [place] [address]    Report Status 10/16/2016 FINAL  Final  MRSA PCR Screening     Status: Abnormal   Collection Time: 10/14/16  1:17 PM  Result Value Ref Range Status   MRSA by PCR INVALID RESULTS, SPECIMEN SENT FOR CULTURE (A) NEGATIVE Final    Comment: A CHAVEZ AT 1454 ON 04.21.2018 BY NBROOKS        The GeneXpert MRSA Assay (FDA approved for NASAL specimens only), is one component of a comprehensive MRSA colonization surveillance program. It is not intended to diagnose MRSA infection nor to guide or monitor treatment for MRSA infections.      Labs: BNP (last 3 results) No results for input(s): BNP in the last 8760 hours. Basic Metabolic Panel:  Recent Labs Lab 10/13/16 1828 10/14/16 1024  NA 139 137  K 3.7 3.5  CL 102 102  CO2 27 26  GLUCOSE 112* 95  BUN 12 9  CREATININE 0.59* 0.57*  CALCIUM 9.2 8.7*  MG 1.4* 2.0  PHOS 3.4 3.2   Liver Function Tests:  Recent Labs Lab 10/13/16 1828 10/14/16 1024  AST 161* 116*  ALT 71* 56  ALKPHOS 87 78  BILITOT 1.1 1.1  PROT 7.3 6.4*  ALBUMIN 4.4 3.7   No results for input(s): LIPASE, AMYLASE in the last 168 hours. No results for input(s): AMMONIA in the last 168 hours. CBC:  Recent Labs Lab 10/13/16 1828 10/14/16 1024 10/16/16 0510  WBC 5.4 3.9* 4.9  NEUTROABS 4.1  --   --   HGB 12.8* 12.3* 12.1*  HCT 36.7* 35.4* 36.0*  MCV 92.9 93.4 95.2  PLT 40* 38* 58*   Cardiac Enzymes: No results for input(s): CKTOTAL, CKMB, CKMBINDEX, TROPONINI in the last 168 hours. BNP: Invalid input(s): POCBNP CBG: No results for input(s): GLUCAP in the last 168 hours. D-Dimer No results for input(s): DDIMER in the last 72 hours. Hgb A1c No results for input(s): HGBA1C in the last 72 hours. Lipid Profile No  results for input(s): CHOL, HDL, LDLCALC, TRIG, CHOLHDL, LDLDIRECT in the last 72 hours. Thyroid function studies No results for input(s): TSH, T4TOTAL, T3FREE, THYROIDAB in the last 72 hours.  Invalid input(s): FREET3 Anemia work up No results for input(s): VITAMINB12, FOLATE, FERRITIN, TIBC, IRON, RETICCTPCT in the last 72 hours. Urinalysis    Component Value Date/Time   COLORURINE AMBER (A) 10/14/2016 0158   APPEARANCEUR HAZY (A) 10/14/2016 0158   APPEARANCEUR Clear 10/23/2014 1607  LABSPEC 1.025 10/14/2016 0158   LABSPEC 1.002 10/23/2014 1607   PHURINE 7.0 10/14/2016 0158   GLUCOSEU NEGATIVE 10/14/2016 0158   GLUCOSEU Negative 10/23/2014 1607   HGBUR SMALL (A) 10/14/2016 0158   BILIRUBINUR NEGATIVE 10/14/2016 0158   BILIRUBINUR Negative 10/23/2014 1607   KETONESUR 20 (A) 10/14/2016 0158   PROTEINUR 100 (A) 10/14/2016 0158   UROBILINOGEN 1.0 09/13/2014 0827   NITRITE NEGATIVE 10/14/2016 0158   LEUKOCYTESUR NEGATIVE 10/14/2016 0158   LEUKOCYTESUR Negative 10/23/2014 1607   Sepsis Labs Invalid input(s): PROCALCITONIN,  WBC,  LACTICIDVEN Microbiology Recent Results (from the past 240 hour(s))  MRSA culture     Status: None   Collection Time: 10/14/16 12:00 PM  Result Value Ref Range Status   Specimen Description NOSE  Final   Special Requests NONE  Final   Culture   Final    NO MRSA DETECTED Performed at [facility], [address], [place], [place] [address]    Report Status 10/16/2016 FINAL  Final  MRSA PCR Screening     Status: Abnormal   Collection Time: 10/14/16  1:17 PM  Result Value Ref Range Status   MRSA by PCR INVALID RESULTS, SPECIMEN SENT FOR CULTURE (A) NEGATIVE Final    Comment: A CHAVEZ AT 1454 ON 04.21.2018 BY NBROOKS        The GeneXpert MRSA Assay (FDA approved for NASAL specimens only), is one component of a comprehensive MRSA colonization surveillance program. It is not intended to diagnose MRSA infection nor to guide or monitor  treatment for MRSA infections.      Time coordinating discharge: 25 minutes  SIGNED:  Latrelle Dodrill, MD  [facility] 10/17/2016, 7:52 PM  Pager please text page via  [email or web address] Password [email or web address]

## 2016-10-17 NOTE — Progress Notes (Addendum)
[facility]   46 y/o M who was admitted   Patient seen and examined, doing well. Received 4 mg of Ativan PO overnight. CIWA has been zero for the past 24 hrs. Patient out of withdrawal window  as patient last drink was reported to be > 5 days ago. Will discharge patient on Librium taper and ativan 0.5 mg q8 hrs PRN anxiety. Patient interested on going to alcohol rehab, social worker arranged appointments and supplied patient with information. Patient will be discharge home with voluntary alcohol rehab.   Full D/c  Summary to follow  Latrelle Dodrill, MD

## 2016-10-17 NOTE — Progress Notes (Signed)
Pt with orders for discharge.  Pt insists on leaving now and taking the bus instead of waiting for a friend to pick him up.  Bus pass given.  Instructions and prescriptions given.

## 2016-11-30 ENCOUNTER — Emergency Department (HOSPITAL_COMMUNITY)
Admission: EM | Admit: 2016-11-30 | Discharge: 2016-11-30 | Disposition: A | Discharge status: Home / Self Care | Payer: Self-pay | Attending: Emergency Medicine | Admitting: Emergency Medicine

## 2016-11-30 ENCOUNTER — Encounter (HOSPITAL_COMMUNITY): Payer: Self-pay | Admitting: Vascular Surgery

## 2016-11-30 DIAGNOSIS — F17210 Nicotine dependence, cigarettes, uncomplicated: Secondary | ICD-10-CM | POA: Insufficient documentation

## 2016-11-30 DIAGNOSIS — F10220 Alcohol dependence with intoxication, uncomplicated: Secondary | ICD-10-CM | POA: Insufficient documentation

## 2016-11-30 DIAGNOSIS — F10920 Alcohol use, unspecified with intoxication, uncomplicated: Secondary | ICD-10-CM

## 2016-11-30 LAB — BASIC METABOLIC PANEL
ANION GAP: 10 (ref 5–15)
BUN: 9 mg/dL (ref 6–20)
CALCIUM: 8.6 mg/dL — AB (ref 8.9–10.3)
CO2: 25 mmol/L (ref 22–32)
CREATININE: 0.71 mg/dL (ref 0.61–1.24)
Chloride: 107 mmol/L (ref 101–111)
GFR calc Af Amer: >60 mL/min (ref >60)
Glucose, Bld: 102 mg/dL — ABNORMAL HIGH (ref 65–99)
Potassium: 3.7 mmol/L (ref 3.5–5.1)
Sodium: 142 mmol/L (ref 135–145)

## 2016-11-30 LAB — CBC WITH DIFFERENTIAL/PLATELET
BASOS ABS: 0.1 10*3/uL (ref 0.0–0.1)
BASOS PCT: 1 %
EOS ABS: 0.2 10*3/uL (ref 0.0–0.7)
EOS PCT: 2 %
HEMATOCRIT: 44.1 % (ref 39.0–52.0)
Hemoglobin: 14.7 g/dL (ref 13.0–17.0)
Lymphocytes Relative: 39 %
Lymphs Abs: 2.9 10*3/uL (ref 0.7–4.0)
MCH: 31.1 pg (ref 26.0–34.0)
MCHC: 33.3 g/dL (ref 30.0–36.0)
MCV: 93.4 fL (ref 78.0–100.0)
MONO ABS: 0.4 10*3/uL (ref 0.1–1.0)
MONOS PCT: 6 %
NEUTROS ABS: 3.9 10*3/uL (ref 1.7–7.7)
Neutrophils Relative %: 52 %
Platelets: 253 10*3/uL (ref 150–400)
RBC: 4.72 MIL/uL (ref 4.22–5.81)
RDW: 13.5 % (ref 11.5–15.5)
WBC: 7.4 10*3/uL (ref 4.0–10.5)

## 2016-11-30 LAB — CBG MONITORING, ED: GLUCOSE-CAPILLARY: 98 mg/dL (ref 65–99)

## 2016-11-30 LAB — ETHANOL: ALCOHOL ETHYL (B): 420 mg/dL — AB (ref <5)

## 2016-11-30 NOTE — ED Provider Notes (Signed)
Emergency Department Provider Note   I have reviewed the triage vital signs and the nursing notes.   HISTORY  Chief Complaint Alcohol Intoxication   HPI Jordan Davidson is a 46 y.o. male with PMH of EtOH abuse, Bipolar, and thrombocytopenia presents to the emergency department in police custody for presumed alcohol intoxication. The patient was taken into custody by the police approximate 3 hours prior to my evaluation. He was found a bottle of liquor and was extremely somnolent. Refused EMS transport on scene. Police brought him here feeling he was too intoxicated to go to jail.   Level 5 caveat: Acute alcohol intoxication.    Past Medical History:  Diagnosis Date  . Alcohol abuse   . Alcoholism (HCC)   . Bipolar 1 disorder, depressed (HCC)   . Depression   . Thrombocytopenia [facility](HCC)     Patient Active Problem List   Diagnosis Date Noted  . Hypomagnesemia 10/14/2016  . Alcohol withdrawal (HCC) 10/13/2016  . Scalp laceration 05/01/2016  . Acute blood loss anemia 05/01/2016  . Thrombocytopenia (HCC) 05/01/2016  . Alcohol abuse 05/01/2016  . Blunt head trauma 04/28/2016  . Major depressive disorder, recurrent episode, moderate (HCC) 12/13/2015  . 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 Bipolar I disorder (HCC) 02/14/2015  . Alcohol use disorder, severe, dependence (HCC) 02/13/2015    Past Surgical History:  Procedure Laterality Date  . NO PAST SURGERIES      Current Outpatient Rx  . Order #: [id number] Class: Historical Med  . Order #: [id number] Class: Print  . Order #: [id number] Class: Print  . Order #: [id number] Class: Historical Med  . Order #: [id number] Class: Print  . Order #: [id number] Class: Historical Med  . Order #: [id number] Class: Print  . Order #: [id number] Class: Historical Med    Allergies Patient has no known allergies.  Family History  Problem Relation Age of Onset  . Hypertension Mother   . Cancer Father   . Heart failure Father     . Alcoholism Father     Social History Social History  Substance Use Topics  . Smoking status: Current Every Day Smoker    Types: Cigarettes  . Smokeless tobacco: Never Used  . Alcohol use Yes     Comment: daily - "whatever I can get" - 1/5 plus daily    Review of Systems  Level 5 caveat: Acute alcohol intoxication  ____________________________________________   PHYSICAL EXAM:  VITAL SIGNS: ED Triage Vitals [11/30/16 2023]  Enc Vitals Group     BP 105/68     Pulse Rate 89     Resp 18     Temp 97.6 F (36.4 C)     Temp Source Oral     SpO2 94 %    Constitutional: Somnolent. Wincing to painful stimuli but not responding verbally or opening eyes.  Eyes: Conjunctivae are normal. Pupils are 4 mm and sluggish bilaterally.  Head: Atraumatic. Nose: No congestion/rhinnorhea. Mouth/Throat: Mucous membranes are dry. Neck: No stridor.   Cardiovascular: Normal rate, regular rhythm. Good peripheral circulation. Grossly normal heart sounds.   Respiratory: Normal respiratory effort.  No retractions. Lungs CTAB. Gastrointestinal: Soft and nontender. No distention.  Musculoskeletal: No lower extremity tenderness nor edema. No gross deformities of extremities. Neurologic:  Somnolent and difficult to arouse. Spontaneously moving upper and lower extremities.  Skin:  Skin is warm, dry and intact. No rash noted.  ____________________________________________   LABS (all labs ordered are listed, but only abnormal results are displayed)  Labs Reviewed  BASIC  METABOLIC PANEL - Abnormal; Notable for the following:       Result Value   Glucose, Bld 102 (*)    Calcium 8.6 (*)    All other components within normal limits  ETHANOL - Abnormal; Notable for the following:    Alcohol, Ethyl (B) 420 (*)    All other components within normal limits  CBC WITH DIFFERENTIAL/PLATELET  CBG MONITORING, ED    ____________________________________________  RADIOLOGY  None ____________________________________________   PROCEDURES  Procedure(s) performed:   Procedures  None ____________________________________________   INITIAL IMPRESSION / ASSESSMENT AND PLAN / ED COURSE  Pertinent labs & imaging results that were available during my care of the patient were reviewed by me and considered in my medical decision making (see chart for details).  Patient presents to the emergency department for evaluation of apparent alcohol intoxication. Police at bedside. Thought to be too intoxicated to go to jail. Patient is very difficult to arouse but does have some grimacing with pain (sternal rub/pinching axilla). No focal deficits. He is shifting in the bed at times and moving all extremities spontaneously. No evidence of head trauma. Pupils 4 mm and reactive bilaterally. No indication for CT scan of the head at this time but will obtain baseline blood work including alcohol level given his level of apparent extreme intoxication and to evaluate for possible metabolic cause.   10:40 PM Patient remains somnolent but is overall more responsive. Continue to monitor.   11:30 PM Patient is standing at bedside and trying to use a urinal. Police at bedside. Patient is under arrest. Will discharge to police custody.   At this time, I do not feel there is any life-threatening condition present. I have reviewed and discussed all results (EKG, imaging, lab, urine as appropriate), exam findings with patient. I have reviewed nursing notes and appropriate previous records.  I feel the patient is safe to be discharged home without further emergent workup. Discussed usual and customary return precautions. Patient and family (if present) verbalize understanding and are comfortable with this plan.  Patient will follow-up with their primary care provider. If they do not have a primary care provider, information for  follow-up has been provided to them. All questions have been answered.  ____________________________________________  FINAL CLINICAL IMPRESSION(S) / ED DIAGNOSES  Final diagnoses:  Alcoholic intoxication without complication (HCC)     MEDICATIONS GIVEN DURING THIS VISIT:  Medications - No data to display   NEW OUTPATIENT MEDICATIONS STARTED DURING THIS VISIT:  None   Note:  This document was prepared using Dragon voice recognition software and may include unintentional dictation errors.  Alona Bene, MD Emergency Medicine   Jordan Davidson, Arlyss Repress, MD 12/01/16 [phone]

## 2016-11-30 NOTE — ED Triage Notes (Signed)
Pt reports to the ED in police custody. He was walking down [address] with a bottle of aristocrat. He EMS was on scene but he refused transport. [facility] went to take him downtown to jail but they refused him stating he was too intoxicated.

## 2016-12-06 ENCOUNTER — Emergency Department (HOSPITAL_COMMUNITY)
Admission: EM | Admit: 2016-12-06 | Discharge: 2016-12-06 | Disposition: A | Discharge status: Home / Self Care | Payer: Self-pay | Attending: Emergency Medicine | Admitting: Emergency Medicine

## 2016-12-06 ENCOUNTER — Emergency Department (HOSPITAL_COMMUNITY): Payer: Self-pay

## 2016-12-06 DIAGNOSIS — F10120 Alcohol abuse with intoxication, uncomplicated: Secondary | ICD-10-CM | POA: Insufficient documentation

## 2016-12-06 DIAGNOSIS — R4182 Altered mental status, unspecified: Secondary | ICD-10-CM | POA: Insufficient documentation

## 2016-12-06 DIAGNOSIS — F10920 Alcohol use, unspecified with intoxication, uncomplicated: Secondary | ICD-10-CM

## 2016-12-06 DIAGNOSIS — Y998 Other external cause status: Secondary | ICD-10-CM | POA: Insufficient documentation

## 2016-12-06 DIAGNOSIS — K625 Hemorrhage of anus and rectum: Secondary | ICD-10-CM

## 2016-12-06 DIAGNOSIS — Z79899 Other long term (current) drug therapy: Secondary | ICD-10-CM | POA: Insufficient documentation

## 2016-12-06 DIAGNOSIS — S0081XA Abrasion of other part of head, initial encounter: Secondary | ICD-10-CM | POA: Insufficient documentation

## 2016-12-06 DIAGNOSIS — F17210 Nicotine dependence, cigarettes, uncomplicated: Secondary | ICD-10-CM | POA: Insufficient documentation

## 2016-12-06 DIAGNOSIS — K921 Melena: Secondary | ICD-10-CM

## 2016-12-06 DIAGNOSIS — Y939 Activity, unspecified: Secondary | ICD-10-CM | POA: Insufficient documentation

## 2016-12-06 DIAGNOSIS — Y33XXXA Other specified events, undetermined intent, initial encounter: Secondary | ICD-10-CM | POA: Insufficient documentation

## 2016-12-06 DIAGNOSIS — F3189 Other bipolar disorder: Secondary | ICD-10-CM | POA: Insufficient documentation

## 2016-12-06 DIAGNOSIS — Y929 Unspecified place or not applicable: Secondary | ICD-10-CM | POA: Insufficient documentation

## 2016-12-06 LAB — ETHANOL: ALCOHOL ETHYL (B): 320 mg/dL — AB (ref <5)

## 2016-12-06 LAB — CBC WITH DIFFERENTIAL/PLATELET
BASOS ABS: 0 10*3/uL (ref 0.0–0.1)
Basophils Relative: 0 %
EOS ABS: 0.1 10*3/uL (ref 0.0–0.7)
Eosinophils Relative: 1 %
HCT: 40.8 % (ref 39.0–52.0)
HEMOGLOBIN: 13.6 g/dL (ref 13.0–17.0)
LYMPHS ABS: 2.5 10*3/uL (ref 0.7–4.0)
Lymphocytes Relative: 38 %
MCH: 31.5 pg (ref 26.0–34.0)
MCHC: 33.3 g/dL (ref 30.0–36.0)
MCV: 94.4 fL (ref 78.0–100.0)
Monocytes Absolute: 0.4 10*3/uL (ref 0.1–1.0)
Monocytes Relative: 6 %
NEUTROS PCT: 55 %
Neutro Abs: 3.7 10*3/uL (ref 1.7–7.7)
PLATELETS: 176 10*3/uL (ref 150–400)
RBC: 4.32 MIL/uL (ref 4.22–5.81)
RDW: 13.4 % (ref 11.5–15.5)
WBC: 6.7 10*3/uL (ref 4.0–10.5)

## 2016-12-06 LAB — COMPREHENSIVE METABOLIC PANEL
ALBUMIN: 3.7 g/dL (ref 3.5–5.0)
ALK PHOS: 58 U/L (ref 38–126)
ALT: 17 U/L (ref 17–63)
AST: 22 U/L (ref 15–41)
Anion gap: 8 (ref 5–15)
BUN: 10 mg/dL (ref 6–20)
CALCIUM: 7.7 mg/dL — AB (ref 8.9–10.3)
CHLORIDE: 109 mmol/L (ref 101–111)
CO2: 27 mmol/L (ref 22–32)
CREATININE: 0.67 mg/dL (ref 0.61–1.24)
GFR calc Af Amer: >60 mL/min (ref >60)
GFR calc non Af Amer: >60 mL/min (ref >60)
Glucose, Bld: 98 mg/dL (ref 65–99)
Potassium: 3.5 mmol/L (ref 3.5–5.1)
SODIUM: 144 mmol/L (ref 135–145)
Total Bilirubin: 0.4 mg/dL (ref 0.3–1.2)
Total Protein: 6.2 g/dL — ABNORMAL LOW (ref 6.5–8.1)

## 2016-12-06 LAB — TYPE AND SCREEN
ABO/RH(D): O POS
Antibody Screen: NEGATIVE

## 2016-12-06 LAB — ABO/RH: ABO/RH(D): O POS

## 2016-12-06 LAB — POC OCCULT BLOOD, ED: Fecal Occult Bld: NEGATIVE

## 2016-12-06 MED ORDER — SODIUM CHLORIDE 0.9 % IV BOLUS (SEPSIS)
1000.0000 mL | Freq: Once | INTRAVENOUS | Status: AC
  Administered 2016-12-06: 1000 mL via INTRAVENOUS

## 2016-12-06 MED ORDER — PANTOPRAZOLE SODIUM 40 MG IV SOLR: 40.0000 mg | Freq: Once | INTRAVENOUS | Status: DC

## 2016-12-06 MED ORDER — CHLORDIAZEPOXIDE HCL 25 MG PO CAPS: 50.0000 mg | ORAL_CAPSULE | Freq: Once | ORAL | Status: DC

## 2016-12-06 MED ORDER — THIAMINE HCL 100 MG/ML IJ SOLN
Freq: Once | INTRAVENOUS | Status: AC
  Administered 2016-12-06: 08:00:00 via INTRAVENOUS
  Filled 2016-12-06: qty 1000

## 2016-12-06 NOTE — Discharge Instructions (Signed)
Please call gastroenterology to arrange colonoscopy and further work-up of your bleeding.  Please return for worsening symptoms, including worsening bleeding, black stools, vomiting blood, or any other symptoms concerning to you.

## 2016-12-06 NOTE — ED Notes (Signed)
Pt refusing to ambulate.  

## 2016-12-06 NOTE — ED Triage Notes (Addendum)
Pt brought in by [employer]. Per [employer] pt unable to walk so they are unable to take him to jail. Pt poor historian. Admits to ETOH. Denies any pain @ this time. Has some scrapes above R eyebrow, and on R elbow area. Denies any other c/o @ this time.

## 2016-12-06 NOTE — ED Provider Notes (Addendum)
[facility]-EMERGENCY DEPT Provider Note   CSN: [id number] Arrival date & time: 12/06/16  4098  By signing my name below, I, [name], attest that this documentation has been prepared under the direction and in the presence of Horton, Mayer Masker, MD. Electronically Signed: [name], ED Scribe. 12/06/16. 3:16 AM.  History   Chief Complaint Chief Complaint  Patient presents with  . Alcohol Intoxication   LEVEL V CAVEAT: HPI and ROS limited due to alcohol intoxication   The history is provided by the patient. No language interpreter was used.    HPI Comments: Jordan Davidson is a 46 y.o. male BIB [employer], with a PMHx of alcoholism, Bipolar 1 disorder, MDD, who presents to the Emergency Department with alcohol intoxication which occurred prior to arrival. Per nursing note, pt was unable to walk while in the process of being incarcerated and they were unable to take him to jail. He denies illicit drug or other substance usage.   7:29 AM Patient with borderline blood pressures while in the ED. Multiple pressures taken however, when he was laying on his side or on the blood pressure cuff. He was given fluids. Manual pressure 90/60. Normal blood pressure 100-120 systolic. Patient is unable initially to give collateral information. Additional fluids and a banana bag were ordered. He is otherwise nontoxic. Not tachycardic. Upon awakening for manual blood pressure, patient now endorsing bloody stools for 1 week. He states that when he goes to the bathroom the toilet is full of blood. He does report some bright red blood. Denies any vomiting blood.  Reports some dizziness. No history of GI bleeds. Does report some upper abdominal discomfort. Additional lab workup ordered including CBC, CMP, Hemoccult. Patient is refusing rectal exam. He has offered to provide a stool sample.  Patient is at risk for GI bleed specifically upper GI bleed given his alcohol abuse. Patient was given IV  Protonix.  Past Medical History:  Diagnosis Date  . Alcohol abuse   . Alcoholism (HCC)   . Bipolar 1 disorder, depressed (HCC)   . Depression   . Thrombocytopenia [facility])    Patient Active Problem List   Diagnosis Date Noted  . Hypomagnesemia 10/14/2016  . Alcohol withdrawal (HCC) 10/13/2016  . Scalp laceration 05/01/2016  . Acute blood loss anemia 05/01/2016  . Thrombocytopenia (HCC) 05/01/2016  . Alcohol abuse 05/01/2016  . Blunt head trauma 04/28/2016  . Major depressive disorder, recurrent episode, moderate (HCC) 12/13/2015  . 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 Bipolar I disorder (HCC) 02/14/2015  . Alcohol use disorder, severe, dependence (HCC) 02/13/2015   Past Surgical History:  Procedure Laterality Date  . NO PAST SURGERIES      Home Medications    Prior to Admission medications   Medication Sig Start Date End Date Taking? Authorizing Provider  amphetamine-dextroamphetamine (ADDERALL) 20 MG tablet Take 20 mg by mouth 2 (two) times daily.    [provider]  chlordiazePOXIDE (LIBRIUM) 25 MG capsule Take 1 tablet TID x 1 day Take 1 tablet BID x 1 day  Take 1 tablet daily for 2 days 10/17/16   Randel Pigg, Dorma Russell, MD  folic acid (FOLVITE) 1 MG tablet Take 1 tablet (1 mg total) by mouth daily. 10/17/16   Lenox Ponds, MD  gabapentin (NEURONTIN) 300 MG capsule Take 300 mg by mouth 3 (three) times daily.    [provider]  LORazepam (ATIVAN) 0.5 MG tablet Take 1 tablet (0.5 mg total) by mouth every 8 (  eight) hours as needed for anxiety. 10/17/16   Randel PiggSilva Zapata, Dorma RussellEdwin, MD  OLANZapine (ZYPREXA) 10 MG tablet Take 10 mg by mouth at bedtime.    [provider]  thiamine 100 MG tablet Take 1 tablet (100 mg total) by mouth daily. 10/18/16   Lenox PondsSilva Zapata, Edwin, MD  traZODone (DESYREL) 100 MG tablet Take 200 mg by mouth at bedtime.    [provider]   Family History Family History  Problem Relation Age of Onset  .  Hypertension Mother   . Cancer Father   . Heart failure Father   . Alcoholism Father    Social History Social History  Substance Use Topics  . Smoking status: Current Every Day Smoker    Types: Cigarettes  . Smokeless tobacco: Never Used  . Alcohol use Yes     Comment: daily - "whatever I can get" - 1/5 plus daily   Allergies   Patient has no known allergies.  Review of Systems Review of Systems  Unable to perform ROS: Other (alcohol intoxication)   Physical Exam Updated Vital Signs BP (!) 87/65   Pulse 64   Temp 97.3 F (36.3 C) (Oral)   Resp 12   Ht 6' (1.829 m)   Wt 77.1 kg (170 lb)   SpO2 (!) 88%   BMI 23.06 kg/m   Physical Exam  Constitutional: No distress.  Somnolent but arousable, no acute distress  HENT:  Head: Normocephalic.  Abrasions noted over the right eyebrow  Eyes: Pupils are equal, round, and reactive to light.  Pupils 3 mm reactive bilaterally  Cardiovascular: Normal rate, regular rhythm and normal heart sounds.   No murmur heard. Pulmonary/Chest: Effort normal. No respiratory distress. He has wheezes.  Abdominal: Soft. Bowel sounds are normal. There is no tenderness.  Neurological:  Somnolent but arousable, oriented 2, cranial nerves II through XII intact, 5 out of 5 strength in all 4 extremities  Skin: Skin is warm and dry.  Psychiatric: He has a normal mood and affect.  Nursing note and vitals reviewed.  ED Treatments / Results  DIAGNOSTIC STUDIES: Oxygen Saturation is 100% on RA, normal by my interpretation.   Labs (all labs ordered are listed, but only abnormal results are displayed) Labs Reviewed  ETHANOL - Abnormal; Notable for the following:       Result Value   Alcohol, Ethyl (B) 320 (*)    All other components within normal limits    EKG  EKG Interpretation None      Radiology Ct Head Wo Contrast  Result Date: 12/06/2016 CLINICAL DATA:  Altered mental status, unable to walk. History of alcoholism and blunt head  trauma. EXAM: CT HEAD WITHOUT CONTRAST TECHNIQUE: Contiguous axial images were obtained from the base of the skull through the vertex without intravenous contrast. COMPARISON:  None. FINDINGS: BRAIN: No intraparenchymal hemorrhage, mass effect nor midline shift. Borderline parenchymal brain volume loss for age. No acute large vascular territory infarcts. No abnormal extra-axial fluid collections. Basal cisterns are patent. VASCULAR: Trace calcific atherosclerosis the carotid siphon. SKULL/SOFT TISSUES: No skull fracture. No significant soft tissue swelling. RIGHT occipital scalp scarring. ORBITS/SINUSES: The included ocular globes and orbital contents are normal.Mild paranasal sinus mucosal thickening without air-fluid levels. Mastoid air cells are well aerated. OTHER: None. IMPRESSION: No acute intracranial process. Borderline parenchymal brain volume loss for age Electronically Signed   By: Awilda Metroourtnay  Bloomer M.D.   On: 12/06/2016 04:45    Procedures Procedures   Medications Ordered in ED Medications -  No data to display  Initial Impression / Assessment and Plan / ED Course  I have reviewed the triage vital signs and the nursing notes.  Pertinent labs & imaging results that were available during my care of the patient were reviewed by me and considered in my medical decision making (see chart for details).     Patient presents with altered mental status and intoxication by police. Minimally contributory to history taking. Somnolent but arousable. Oriented 2. Nonfocal. He does have evidence of moderate trauma to the face with abrasions over the right eyebrow. Unclear etiology. Given intoxication, will obtain head CT. Alcohol level 350. History of the same. Will allow to clear.  7:31 AM See additional history provided above upon patient awakening. Marginal blood pressures but otherwise nontoxic. Given fluids and IV Protonix. GI workup initiated. Patient signed out to Dr. Verdie Mosher pending  workup.  Final Clinical Impressions(s) / ED Diagnoses   Final diagnoses:  Alcoholic intoxication without complication (HCC)  Abrasion of face, initial encounter  Bloody stools   New Prescriptions New Prescriptions   No medications on file   I personally performed the services described in this documentation, which was scribed in my presence. The recorded information has been reviewed and is accurate.     Shon Baton, MD 12/06/16 1610    Shon Baton, MD 12/06/16 9604    Shon Baton, MD 12/06/16 [phone]

## 2016-12-06 NOTE — ED Provider Notes (Signed)
Please see previous physicians note regarding patient's presenting history and physical, initial ED course, and associated medical decision making.  Patient presented with acute alcohol intoxication, and observed until clinically sober. My reevaluation, he is alert, oriented, answering questions appropriately and maintaining conversation appropriately. Has ambulated steadily in the ED and tolerated by mouth intake without difficulty. Patient still refusing rectal exam, but was able to have a bowel movement in the ED. He had brown stool, with red blood streaked around. With normal hemoglobin and normal BUN/creatinine ratio. No concerns for significant upper GI bleeding. He is requesting to be discharged.  I do think he is appropriate for outpatient workup of his bleeding. Strict return and follow-up instructions reviewed. He expressed understanding of all discharge instructions and felt comfortable with the plan of care. Lavera Guise.       Josette Shimabukuro Duo, MD 12/06/16 1224

## 2016-12-13 ENCOUNTER — Emergency Department (HOSPITAL_COMMUNITY)
Admission: EM | Admit: 2016-12-13 | Discharge: 2016-12-13 | Disposition: A | Discharge status: Home / Self Care | Payer: Self-pay | Attending: Emergency Medicine | Admitting: Emergency Medicine

## 2016-12-13 ENCOUNTER — Encounter (HOSPITAL_COMMUNITY): Payer: Self-pay | Admitting: Emergency Medicine

## 2016-12-13 DIAGNOSIS — F17210 Nicotine dependence, cigarettes, uncomplicated: Secondary | ICD-10-CM | POA: Insufficient documentation

## 2016-12-13 DIAGNOSIS — Z79899 Other long term (current) drug therapy: Secondary | ICD-10-CM | POA: Insufficient documentation

## 2016-12-13 DIAGNOSIS — H1033 Unspecified acute conjunctivitis, bilateral: Secondary | ICD-10-CM

## 2016-12-13 MED ORDER — CEPHALEXIN 500 MG PO CAPS
500.0000 mg | ORAL_CAPSULE | Freq: Once | ORAL | Status: DC
  Filled 2016-12-13: qty 1

## 2016-12-13 MED ORDER — DIPHENHYDRAMINE HCL 25 MG PO CAPS
50.0000 mg | ORAL_CAPSULE | Freq: Once | ORAL | Status: DC
  Filled 2016-12-13: qty 2

## 2016-12-13 MED ORDER — ERYTHROMYCIN 5 MG/GM OP OINT
TOPICAL_OINTMENT | Freq: Once | OPHTHALMIC | Status: DC
  Filled 2016-12-13: qty 3.5

## 2016-12-13 MED ORDER — CEPHALEXIN 500 MG PO CAPS: 500.0000 mg | ORAL_CAPSULE | Freq: Three times a day (TID) | ORAL | 0 refills | Status: AC

## 2016-12-13 MED ORDER — ERYTHROMYCIN 5 MG/GM OP OINT: TOPICAL_OINTMENT | OPHTHALMIC | 0 refills | Status: DC

## 2016-12-13 NOTE — ED Triage Notes (Signed)
Pt comes in via EMS with complaints of eye pain with associated discharge as well as detox.  Cooperative in route. Vitals WNL.

## 2016-12-13 NOTE — ED Provider Notes (Signed)
[facility]-EMERGENCY DEPT Provider Note   CSN: [id number] Arrival date & time: 12/13/16  1721     History   Chief Complaint Chief Complaint  Patient presents with  . Detox  . Eye Drainage    HPI Jordan Davidson is a 46 y.o. male.  HPI  46 yo M with h/o alcoholism, bipolar disorder here with eye drainage, alcohol intoxication. Pt states he has been drinking "a lot" and woke up today noticing bloodshot eyes bilaterally. He was sleeping outside, on grass. He has had moderate eye itching and pain since then. He also states he has been drinking heavily but no more than usual. No fevers or chills. No pain with eye movements. No vision changes.  Past Medical History:  Diagnosis Date  . Alcohol abuse   . Alcoholism (HCC)   . Bipolar 1 disorder, depressed (HCC)   . Depression   . Thrombocytopenia [facility](HCC)     Patient Active Problem List   Diagnosis Date Noted  . Hypomagnesemia 10/14/2016  . Alcohol withdrawal (HCC) 10/13/2016  . Scalp laceration 05/01/2016  . Acute blood loss anemia 05/01/2016  . Thrombocytopenia (HCC) 05/01/2016  . Alcohol abuse 05/01/2016  . Blunt head trauma 04/28/2016  . Major depressive disorder, recurrent episode, moderate (HCC) 12/13/2015  . 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 Bipolar I disorder (HCC) 02/14/2015  . Alcohol use disorder, severe, dependence (HCC) 02/13/2015    Past Surgical History:  Procedure Laterality Date  . NO PAST SURGERIES         Home Medications    Prior to Admission medications   Medication Sig Start Date End Date Taking? Authorizing Provider  amphetamine-dextroamphetamine (ADDERALL) 20 MG tablet Take 20 mg by mouth 2 (two) times daily.    [provider]  cephALEXin (KEFLEX) 500 MG capsule Take 1 capsule (500 mg total) by mouth 3 (three) times daily. 12/13/16 12/20/16  Shaune PollackIsaacs, Jerimiah Wolman, MD  chlordiazePOXIDE (LIBRIUM) 25 MG capsule Take 1 tablet TID x 1 day Take 1 tablet BID x 1 day  Take 1 tablet  daily for 2 days 10/17/16   Randel PiggSilva Zapata, Dorma RussellEdwin, MD  erythromycin ophthalmic ointment Place a 1/2 inch ribbon of ointment into the lower eyelid twice daily for 5 days 12/13/16   Shaune PollackIsaacs, Jovi Alvizo, MD  folic acid (FOLVITE) 1 MG tablet Take 1 tablet (1 mg total) by mouth daily. 10/17/16   Lenox PondsSilva Zapata, Edwin, MD  gabapentin (NEURONTIN) 300 MG capsule Take 300 mg by mouth 3 (three) times daily.    [provider]  LORazepam (ATIVAN) 0.5 MG tablet Take 1 tablet (0.5 mg total) by mouth every 8 (eight) hours as needed for anxiety. 10/17/16   Randel PiggSilva Zapata, Dorma RussellEdwin, MD  OLANZapine (ZYPREXA) 10 MG tablet Take 10 mg by mouth at bedtime.    [provider]  thiamine 100 MG tablet Take 1 tablet (100 mg total) by mouth daily. 10/18/16   Lenox PondsSilva Zapata, Edwin, MD  traZODone (DESYREL) 100 MG tablet Take 200 mg by mouth at bedtime.    [provider]    Family History Family History  Problem Relation Age of Onset  . Hypertension Mother   . Cancer Father   . Heart failure Father   . Alcoholism Father     Social History Social History  Substance Use Topics  . Smoking status: Current Every Day Smoker    Types: Cigarettes  . Smokeless tobacco: Never Used  . Alcohol use Yes     Comment: daily - "whatever I  can get" - 1/5 plus daily     Allergies   Patient has no known allergies.   Review of Systems Review of Systems  Constitutional: Positive for fatigue.  Eyes: Positive for discharge, redness and itching.  All other systems reviewed and are negative.    Physical Exam Updated Vital Signs BP (!) 117/92 (BP Location: Left Arm)   Pulse (!) 113   Temp 98.8 F (37.1 C) (Oral)   Resp 16   Ht 6\' 1"  (1.854 m)   Wt 83.9 kg (185 lb)   SpO2 96%   BMI 24.41 kg/m   Physical Exam  Constitutional: He appears well-developed and well-nourished. No distress.  Disheveled, smells of alcohol  HENT:  Head: Normocephalic and atraumatic.  Mild periorbital erythema and edema, without  induration. No TTP. No fluctuance.  Eyes:  Mild bilateral conjunctival injection, with clear discharge. No proptosis. No pain with EOM.  Neck: Neck supple.  Cardiovascular: Normal rate, regular rhythm and normal heart sounds.  Exam reveals no friction rub.   No murmur heard. Pulmonary/Chest: Effort normal and breath sounds normal. No respiratory distress. He has no wheezes. He has no rales.  Abdominal: He exhibits no distension.  Musculoskeletal: He exhibits no edema.  Neurological: He is alert. He exhibits normal muscle tone.  Intoxicated but oriented x 3. Gait stable.  Skin: Skin is warm. Capillary refill takes less than 2 seconds.  Psychiatric: He has a normal mood and affect.  Nursing note and vitals reviewed.    ED Treatments / Results  Labs (all labs ordered are listed, but only abnormal results are displayed) Labs Reviewed - No data to display  EKG  EKG Interpretation None       Radiology No results found.  Procedures Procedures (including critical care time)  Medications Ordered in ED Medications  erythromycin ophthalmic ointment ( Both Eyes Not Given 12/13/16 2258)  diphenhydrAMINE (BENADRYL) capsule 50 mg (50 mg Oral Not Given 12/13/16 2259)  cephALEXin (KEFLEX) capsule 500 mg (500 mg Oral Not Given 12/13/16 2259)     Initial Impression / Assessment and Plan / ED Course  I have reviewed the triage vital signs and the nursing notes.  Pertinent labs & imaging results that were available during my care of the patient were reviewed by me and considered in my medical decision making (see chart for details).    46 yo M with PMHx as above here with alcohol intoxication and eye redness/pain. Regarding his EtOH intoxication, he has a long h/o same and is clinically able to ambulate, talk without difficulty. He is ambulatory throughout ED without difficulty. No signs of acute complications. Re: his eye redness, suspect this is allergic conjunctivitis likely 2/2 sleeping  outside, though early bacterial conjunctivitis is also on ddx. No evidence of post-septal cellulitis, vision intact. No evidence of sepsis. Will tx with romycin, keflex for surrounding redness, and d/c home.  Final Clinical Impressions(s) / ED Diagnoses   Final diagnoses:  Acute bacterial conjunctivitis of both eyes    New Prescriptions Discharge Medication List as of 12/13/2016  9:01 PM    START taking these medications   Details  cephALEXin (KEFLEX) 500 MG capsule Take 1 capsule (500 mg total) by mouth 3 (three) times daily., Starting Wed 12/13/2016, Until Wed 12/20/2016, Print    erythromycin ophthalmic ointment Place a 1/2 inch ribbon of ointment into the lower eyelid twice daily for 5 days, Print         Shaune Pollack, MD 12/14/16 [phone]

## 2016-12-13 NOTE — ED Notes (Signed)
Pt left without receiving medications or discharge instructions.

## 2016-12-14 ENCOUNTER — Encounter (HOSPITAL_COMMUNITY): Payer: Self-pay | Admitting: *Deleted

## 2016-12-14 ENCOUNTER — Emergency Department (HOSPITAL_COMMUNITY)
Admission: EM | Admit: 2016-12-14 | Discharge: 2016-12-14 | Disposition: A | Discharge status: Home / Self Care | Payer: Self-pay | Attending: Emergency Medicine | Admitting: Emergency Medicine

## 2016-12-14 DIAGNOSIS — Z79899 Other long term (current) drug therapy: Secondary | ICD-10-CM | POA: Insufficient documentation

## 2016-12-14 DIAGNOSIS — H10021 Other mucopurulent conjunctivitis, right eye: Secondary | ICD-10-CM | POA: Insufficient documentation

## 2016-12-14 DIAGNOSIS — F17210 Nicotine dependence, cigarettes, uncomplicated: Secondary | ICD-10-CM | POA: Insufficient documentation

## 2016-12-14 MED ORDER — OFLOXACIN 0.3 % OP SOLN: 1.0000 [drp] | Freq: Four times a day (QID) | OPHTHALMIC | 0 refills | Status: DC

## 2016-12-14 NOTE — Discharge Planning (Signed)
EDCM provided [facility] brochure as requested.

## 2016-12-14 NOTE — Discharge Instructions (Signed)
Please use the eye drops prescribed.  Do not wear contact while getting better, and see the eye specialist in 3 days.

## 2016-12-14 NOTE — ED Notes (Signed)
Pt given instructions and prescription. [facility] Pt verbalizes understanding. DC ambulatory with steady gait.

## 2016-12-14 NOTE — ED Provider Notes (Signed)
[facility]-EMERGENCY DEPT Provider Note   CSN: [id number] Arrival date & time: 12/14/16  0325     History   Chief Complaint Chief Complaint  Patient presents with  . Conjunctivitis  . Alcohol Intoxication    HPI Jordan Davidson is a 46 y.o. male.  HPI   Pt comes in with cc of eye pain. He admits to drinking prior to ER arrival. Pt reports pain to the eye for 2 days with redness and drainage. Pt denies worsening of pain with light. Pt has no other complains at this time. Pt is a contacts user. He was seen in the ER for the same complain yday, discharged with antibiotics ointment that he never took.  Past Medical History:  Diagnosis Date  . Alcohol abuse   . Alcoholism (HCC)   . Bipolar 1 disorder, depressed (HCC)   . Depression   . Thrombocytopenia [facility](HCC)     Patient Active Problem List   Diagnosis Date Noted  . Hypomagnesemia 10/14/2016  . Alcohol withdrawal (HCC) 10/13/2016  . Scalp laceration 05/01/2016  . Acute blood loss anemia 05/01/2016  . Thrombocytopenia (HCC) 05/01/2016  . Alcohol abuse 05/01/2016  . Blunt head trauma 04/28/2016  . Major depressive disorder, recurrent episode, moderate (HCC) 12/13/2015  . 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 Bipolar I disorder (HCC) 02/14/2015  . Alcohol use disorder, severe, dependence (HCC) 02/13/2015    Past Surgical History:  Procedure Laterality Date  . NO PAST SURGERIES         Home Medications    Prior to Admission medications   Medication Sig Start Date End Date Taking? Authorizing Provider  amphetamine-dextroamphetamine (ADDERALL) 20 MG tablet Take 20 mg by mouth 2 (two) times daily.    [provider]  cephALEXin (KEFLEX) 500 MG capsule Take 1 capsule (500 mg total) by mouth 3 (three) times daily. 12/13/16 12/20/16  Shaune PollackIsaacs, Cameron, MD  chlordiazePOXIDE (LIBRIUM) 25 MG capsule Take 1 tablet TID x 1 day Take 1 tablet BID x 1 day  Take 1 tablet daily for 2 days 10/17/16   Randel PiggSilva Zapata,  Dorma RussellEdwin, MD  erythromycin ophthalmic ointment Place a 1/2 inch ribbon of ointment into the lower eyelid twice daily for 5 days 12/13/16   Shaune PollackIsaacs, Cameron, MD  folic acid (FOLVITE) 1 MG tablet Take 1 tablet (1 mg total) by mouth daily. 10/17/16   Lenox PondsSilva Zapata, Edwin, MD  gabapentin (NEURONTIN) 300 MG capsule Take 300 mg by mouth 3 (three) times daily.    [provider]  LORazepam (ATIVAN) 0.5 MG tablet Take 1 tablet (0.5 mg total) by mouth every 8 (eight) hours as needed for anxiety. 10/17/16   Lenox PondsSilva Zapata, Edwin, MD  ofloxacin (OCUFLOX) 0.3 % ophthalmic solution Place 1 drop into the right eye 4 (four) times daily. 12/14/16   Derwood KaplanNanavati, Miranda Garber, MD  OLANZapine (ZYPREXA) 10 MG tablet Take 10 mg by mouth at bedtime.    [provider]  thiamine 100 MG tablet Take 1 tablet (100 mg total) by mouth daily. 10/18/16   Lenox PondsSilva Zapata, Edwin, MD  traZODone (DESYREL) 100 MG tablet Take 200 mg by mouth at bedtime.    [provider]    Family History Family History  Problem Relation Age of Onset  . Hypertension Mother   . Cancer Father   . Heart failure Father   . Alcoholism Father     Social History Social History  Substance Use Topics  . Smoking status: Current Every Day Smoker  Types: Cigarettes  . Smokeless tobacco: Never Used  . Alcohol use Yes     Comment: daily - "whatever I can get" - 1/5 plus daily     Allergies   Patient has no known allergies.   Review of Systems Review of Systems  HENT: Negative for congestion.   Eyes: Positive for pain, discharge and redness. Negative for photophobia and visual disturbance.  Respiratory: Negative for shortness of breath.   Cardiovascular: Negative for chest pain.  Gastrointestinal: Negative for abdominal pain.     Physical Exam Updated Vital Signs BP 107/72 (BP Location: Right Arm)   Pulse 90   Temp 97.7 F (36.5 C) (Axillary)   Resp 18   SpO2 93%   Physical Exam  Constitutional: He is oriented to person,  place, and time. He appears well-developed.  HENT:  Head: Atraumatic.  Eyes: EOM are normal. Pupils are equal, round, and reactive to light. Right eye exhibits discharge. No scleral icterus.  R eye is erythematous and there is mucopurulent drainage. No consensual photophobia.  Neck: Neck supple.  Cardiovascular: Normal rate.   Pulmonary/Chest: Effort normal.  Neurological: He is alert and oriented to person, place, and time.  Skin: Skin is warm.  Nursing note and vitals reviewed.    ED Treatments / Results  Labs (all labs ordered are listed, but only abnormal results are displayed) Labs Reviewed - No data to display  EKG  EKG Interpretation None       Radiology No results found.  Procedures Procedures (including critical care time)  Medications Ordered in ED Medications - No data to display   Initial Impression / Assessment and Plan / ED Course  I have reviewed the triage vital signs and the nursing notes.  Pertinent labs & imaging results that were available during my care of the patient were reviewed by me and considered in my medical decision making (see chart for details).     Pt comes in with eye pain and drainage. Likely bacterial conjunctivitis.  With him using contacts -we will actually five him ofloxacin. Case management consulted for med help.  Final Clinical Impressions(s) / ED Diagnoses   Final diagnoses:  Other mucopurulent conjunctivitis of right eye    New Prescriptions New Prescriptions   OFLOXACIN (OCUFLOX) 0.3 % OPHTHALMIC SOLUTION    Place 1 drop into the right eye 4 (four) times daily.     Derwood Kaplan, MD 12/14/16 [phone]

## 2016-12-14 NOTE — ED Triage Notes (Signed)
Pt c/o R eye pain for a couple of days; redness and drainage noted from eye. ETOH on board

## 2016-12-14 NOTE — ED Notes (Signed)
Pt declines to sign DC instructions.

## 2016-12-15 ENCOUNTER — Encounter (HOSPITAL_COMMUNITY): Payer: Self-pay | Admitting: Emergency Medicine

## 2016-12-15 ENCOUNTER — Emergency Department (HOSPITAL_COMMUNITY)
Admission: EM | Admit: 2016-12-15 | Discharge: 2016-12-15 | Disposition: A | Discharge status: Home / Self Care | Payer: Self-pay | Attending: Emergency Medicine | Admitting: Emergency Medicine

## 2016-12-15 DIAGNOSIS — H10021 Other mucopurulent conjunctivitis, right eye: Secondary | ICD-10-CM | POA: Insufficient documentation

## 2016-12-15 DIAGNOSIS — F10920 Alcohol use, unspecified with intoxication, uncomplicated: Secondary | ICD-10-CM

## 2016-12-15 DIAGNOSIS — H1031 Unspecified acute conjunctivitis, right eye: Secondary | ICD-10-CM

## 2016-12-15 DIAGNOSIS — F10120 Alcohol abuse with intoxication, uncomplicated: Secondary | ICD-10-CM | POA: Insufficient documentation

## 2016-12-15 DIAGNOSIS — F17210 Nicotine dependence, cigarettes, uncomplicated: Secondary | ICD-10-CM | POA: Insufficient documentation

## 2016-12-15 DIAGNOSIS — Z79899 Other long term (current) drug therapy: Secondary | ICD-10-CM | POA: Insufficient documentation

## 2016-12-15 NOTE — ED Notes (Signed)
Pt refused other vital signs

## 2016-12-15 NOTE — ED Provider Notes (Signed)
[facility] DE[facility] Provider Note   CSN: [id number] Arrival date & time: 12/15/16  1610     History   Chief Complaint Chief Complaint  Patient presents with  . Conjunctivitis    right    HPI Jordan Davidson is a 46 y.o. male.  HPI Pt complains of ongoing eye drainage to the right eye. On abx. Brought to ER after found intoxicated at the gas station. No visual change. Just ongoing drainage and redness. No known trauma. Does not wear contacts. Pain is mild   Past Medical History:  Diagnosis Date  . Alcohol abuse   . Alcoholism (HCC)   . Bipolar 1 disorder, depressed (HCC)   . Depression   . Thrombocytopenia [facility])     Patient Active Problem List   Diagnosis Date Noted  . Hypomagnesemia 10/14/2016  . Alcohol withdrawal (HCC) 10/13/2016  . Scalp laceration 05/01/2016  . Acute blood loss anemia 05/01/2016  . Thrombocytopenia (HCC) 05/01/2016  . Alcohol abuse 05/01/2016  . Blunt head trauma 04/28/2016  . Major depressive disorder, recurrent episode, moderate (HCC) 12/13/2015  . 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 Bipolar I disorder (HCC) 02/14/2015  . Alcohol use disorder, severe, dependence (HCC) 02/13/2015    Past Surgical History:  Procedure Laterality Date  . NO PAST SURGERIES         Home Medications    Prior to Admission medications   Medication Sig Start Date End Date Taking? Authorizing Provider  amphetamine-dextroamphetamine (ADDERALL) 20 MG tablet Take 20 mg by mouth 2 (two) times daily.    [provider]  cephALEXin (KEFLEX) 500 MG capsule Take 1 capsule (500 mg total) by mouth 3 (three) times daily. 12/13/16 12/20/16  Shaune Pollack, MD  chlordiazePOXIDE (LIBRIUM) 25 MG capsule Take 1 tablet TID x 1 day Take 1 tablet BID x 1 day  Take 1 tablet daily for 2 days 10/17/16   Randel Pigg, Dorma Russell, MD  erythromycin ophthalmic ointment Place a 1/2 inch ribbon of ointment into the lower eyelid twice daily for 5 days 12/13/16    Shaune Pollack, MD  folic acid (FOLVITE) 1 MG tablet Take 1 tablet (1 mg total) by mouth daily. 10/17/16   Lenox Ponds, MD  gabapentin (NEURONTIN) 300 MG capsule Take 300 mg by mouth 3 (three) times daily.    [provider]  LORazepam (ATIVAN) 0.5 MG tablet Take 1 tablet (0.5 mg total) by mouth every 8 (eight) hours as needed for anxiety. 10/17/16   Lenox Ponds, MD  ofloxacin (OCUFLOX) 0.3 % ophthalmic solution Place 1 drop into the right eye 4 (four) times daily. 12/14/16   Derwood Kaplan, MD  OLANZapine (ZYPREXA) 10 MG tablet Take 10 mg by mouth at bedtime.    [provider]  thiamine 100 MG tablet Take 1 tablet (100 mg total) by mouth daily. 10/18/16   Lenox Ponds, MD  traZODone (DESYREL) 100 MG tablet Take 200 mg by mouth at bedtime.    [provider]    Family History Family History  Problem Relation Age of Onset  . Hypertension Mother   . Cancer Father   . Heart failure Father   . Alcoholism Father     Social History Social History  Substance Use Topics  . Smoking status: Current Every Day Smoker    Types: Cigarettes  . Smokeless tobacco: Never Used  . Alcohol use Yes     Comment: daily - "whatever I can get" - 1/5 plus daily  Allergies   Patient has no known allergies.   Review of Systems Review of Systems  All other systems reviewed and are negative.    Physical Exam Updated Vital Signs There were no vitals taken for this visit.  Physical Exam  Constitutional: He is oriented to person, place, and time. He appears well-developed and well-nourished.  HENT:  Head: Normocephalic.  Eyes:  Conjunctival injection in the right eye with some eye drainage. Pupil normal. EOM normal  Neck: Normal range of motion.  Pulmonary/Chest: Effort normal.  Abdominal: He exhibits no distension.  Musculoskeletal: Normal range of motion.  Neurological: He is alert and oriented to person, place, and time.  Psychiatric: He has  a normal mood and affect.  Nursing note and vitals reviewed.    ED Treatments / Results  Labs (all labs ordered are listed, but only abnormal results are displayed) Labs Reviewed - No data to display  EKG  EKG Interpretation None       Radiology No results found.  Procedures Procedures (including critical care time)  Medications Ordered in ED Medications - No data to display   Initial Impression / Assessment and Plan / ED Course  I have reviewed the triage vital signs and the nursing notes.  Pertinent labs & imaging results that were available during my care of the patient were reviewed by me and considered in my medical decision making (see chart for details).     Ongoing abx drops and optho follow up. Doubt orbital cellulitis  Final Clinical Impressions(s) / ED Diagnoses   Final diagnoses:  Acute bacterial conjunctivitis of right eye  Alcoholic intoxication without complication [facility](HCC)    New Prescriptions New Prescriptions   No medications on file     Azalia Bilisampos, Natallia Stellmach, MD 12/15/16 1630

## 2016-12-15 NOTE — ED Notes (Signed)
Pt ambulated out of the ED without difficulty. 

## 2016-12-15 NOTE — ED Triage Notes (Signed)
Per [facility] and right eye drainage that patient has been seen here for on 6/20 and 6/21 and has eye drops in his pocket.

## 2016-12-22 ENCOUNTER — Emergency Department (HOSPITAL_COMMUNITY)
Admission: EM | Admit: 2016-12-22 | Discharge: 2016-12-22 | Disposition: A | Discharge status: Home / Self Care | Payer: Self-pay | Attending: Emergency Medicine | Admitting: Emergency Medicine

## 2016-12-22 ENCOUNTER — Encounter (HOSPITAL_COMMUNITY): Payer: Self-pay | Admitting: Nurse Practitioner

## 2016-12-22 DIAGNOSIS — F17210 Nicotine dependence, cigarettes, uncomplicated: Secondary | ICD-10-CM | POA: Insufficient documentation

## 2016-12-22 DIAGNOSIS — F10920 Alcohol use, unspecified with intoxication, uncomplicated: Secondary | ICD-10-CM

## 2016-12-22 DIAGNOSIS — F10120 Alcohol abuse with intoxication, uncomplicated: Secondary | ICD-10-CM | POA: Insufficient documentation

## 2016-12-22 DIAGNOSIS — Z79899 Other long term (current) drug therapy: Secondary | ICD-10-CM | POA: Insufficient documentation

## 2016-12-22 NOTE — Discharge Instructions (Signed)
Return as needed. The conjunctivitis of the eyes is improved significantly.

## 2016-12-22 NOTE — ED Triage Notes (Signed)
Pt is presented by EMS, report of alcohol intoxication and pt c/o of eye problems.

## 2016-12-22 NOTE — ED Notes (Signed)
[facility] Expected date:  Expected time:  Means of arrival:  Comments: EMS- 46yo M, ETOH intoxication

## 2016-12-22 NOTE — ED Provider Notes (Signed)
[facility] Provider Note   CSN: [id number] Arrival date & time: 12/22/16  1751     History   Chief Complaint Chief Complaint  Patient presents with  . Alcohol Intoxication    HPI Jordan Davidson is a 46 y.o. male.  Patient is homeless. Patient with a history of alcohol abuse. Patient recently has been seen for conjunctivitis of the eyes. Patient wanted that rechecked. Patient was sleeping for a while and now is awake and cooperative. Patient also has a history of bipolar disorder. Patient states that the eyes are improving.      Past Medical History:  Diagnosis Date  . Alcohol abuse   . Alcoholism (HCC)   . Bipolar 1 disorder, depressed (HCC)   . Depression   . Thrombocytopenia [facility])     Patient Active Problem List   Diagnosis Date Noted  . Hypomagnesemia 10/14/2016  . Alcohol withdrawal (HCC) 10/13/2016  . Scalp laceration 05/01/2016  . Acute blood loss anemia 05/01/2016  . Thrombocytopenia (HCC) 05/01/2016  . Alcohol abuse 05/01/2016  . Blunt head trauma 04/28/2016  . Major depressive disorder, recurrent episode, moderate (HCC) 12/13/2015  . 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 Bipolar I disorder (HCC) 02/14/2015  . Alcohol use disorder, severe, dependence (HCC) 02/13/2015    Past Surgical History:  Procedure Laterality Date  . NO PAST SURGERIES         Home Medications    Prior to Admission medications   Medication Sig Start Date End Date Taking? Authorizing Provider  chlordiazePOXIDE (LIBRIUM) 25 MG capsule Take 1 tablet TID x 1 day Take 1 tablet BID x 1 day  Take 1 tablet daily for 2 days Patient not taking: Reported on 12/22/2016 10/17/16   Randel Pigg, Dorma Russell, MD  folic acid (FOLVITE) 1 MG tablet Take 1 tablet (1 mg total) by mouth daily. Patient not taking: Reported on 12/22/2016 10/17/16   Lenox Ponds, MD    Family History Family History  Problem Relation Age of Onset  . Hypertension Mother   . Cancer Father    . Heart failure Father   . Alcoholism Father     Social History Social History  Substance Use Topics  . Smoking status: Current Every Day Smoker    Types: Cigarettes  . Smokeless tobacco: Never Used  . Alcohol use Yes     Comment: daily - "whatever I can get" - 1/5 plus daily     Allergies   Patient has no known allergies.   Review of Systems Review of Systems  Constitutional: Negative for fever.  HENT: Negative for congestion.   Eyes: Positive for redness.  Respiratory: Negative for shortness of breath.   Cardiovascular: Negative for chest pain.  Gastrointestinal: Negative for abdominal pain.  Genitourinary: Negative for dysuria.  Musculoskeletal: Negative for back pain.  Skin: Negative for rash.  Neurological: Negative for light-headedness.  Hematological: Does not bruise/bleed easily.  Psychiatric/Behavioral: Negative for confusion.     Physical Exam Updated Vital Signs BP 118/78 (BP Location: Left Arm)   Pulse (!) 106   Temp 98.4 F (36.9 C) (Oral)   Resp 16   SpO2 93%   Physical Exam  Constitutional: He is oriented to person, place, and time. He appears well-developed and well-nourished. No distress.  HENT:  Head: Normocephalic and atraumatic.  Mouth/Throat: Oropharynx is clear and moist.  Eyes: Conjunctivae and EOM are normal. Pupils are equal, round, and reactive to light. No scleral icterus.  No significant conjunctival infection at  this time. No significant erythema no significant discharge.  Neck: Normal range of motion. Neck supple.  Cardiovascular: Normal rate, regular rhythm and normal heart sounds.   Pulmonary/Chest: Effort normal and breath sounds normal. No respiratory distress.  Abdominal: Soft. Bowel sounds are normal. There is no tenderness.  Musculoskeletal: Normal range of motion. He exhibits no edema.  Neurological: He is alert and oriented to person, place, and time. No cranial nerve deficit or sensory deficit. He exhibits normal  muscle tone. Coordination normal.  Skin: Skin is warm.  Nursing note and vitals reviewed.    ED Treatments / Results  Labs (all labs ordered are listed, but only abnormal results are displayed) Labs Reviewed - No data to display  EKG  EKG Interpretation None       Radiology No results found.  Procedures Procedures (including critical care time)  Medications Ordered in ED Medications - No data to display   Initial Impression / Assessment and Plan / ED Course  I have reviewed the triage vital signs and the nursing notes.  Pertinent labs & imaging results that were available during my care of the patient were reviewed by me and considered in my medical decision making (see chart for details).     The patient originally very sleepy on arrival. Brought in by EMS. Patient now awake. Cooperative. Put some saline drops in the eyes were feeling dry but no significant conjunctivae infection at this time. Patient given some food. Patient stable for discharge.   Final Clinical Impressions(s) / ED Diagnoses   Final diagnoses:  Alcoholic intoxication without complication [facility](HCC)    New Prescriptions New Prescriptions   No medications on file     Vanetta MuldersZackowski, Coraline Talwar, MD 12/22/16 2338

## 2016-12-25 ENCOUNTER — Emergency Department (HOSPITAL_COMMUNITY)
Admission: EM | Admit: 2016-12-25 | Discharge: 2016-12-25 | Disposition: A | Discharge status: Home / Self Care | Payer: Self-pay | Attending: Emergency Medicine | Admitting: Emergency Medicine

## 2016-12-25 DIAGNOSIS — F10920 Alcohol use, unspecified with intoxication, uncomplicated: Secondary | ICD-10-CM | POA: Insufficient documentation

## 2016-12-25 DIAGNOSIS — R110 Nausea: Secondary | ICD-10-CM | POA: Insufficient documentation

## 2016-12-25 DIAGNOSIS — Z5329 Procedure and treatment not carried out because of patient's decision for other reasons: Secondary | ICD-10-CM | POA: Insufficient documentation

## 2016-12-25 NOTE — ED Notes (Addendum)
Patient ambulatory to secretary desk, stated to her that he was leaving. Patient was able to eat and drink before leaving. EDP made aware.

## 2016-12-25 NOTE — ED Triage Notes (Signed)
Per EMS, a bystander called after seeing pt [place] surrounded by cans and bottles of alcohol. Pt reported to EMS that he had a fifth of liquor this morning. Pt only complained of nausea. EMS reports no vomiting or pain.

## 2016-12-25 NOTE — ED Provider Notes (Signed)
The patient is now clinically sober. He is ambulatory without difficulty. He left the emergency department prior to receiving papers but appeared awake, alert, tolerating by mouth, and in no distress. Prior to this, I did interview him and he declined any suicidal ideation, homicidal ideation, or hallucinations.   Shaune PollackIsaacs, Milissa Fesperman, MD 12/26/16 [id number]

## 2016-12-25 NOTE — ED Notes (Signed)
Bed: [facility] Expected date:  Expected time:  Means of arrival:  Comments: EMS ETOH 

## 2017-01-01 ENCOUNTER — Emergency Department (HOSPITAL_COMMUNITY)
Admission: EM | Admit: 2017-01-01 | Discharge: 2017-01-02 | Disposition: A | Discharge status: Home / Self Care | Payer: Self-pay | Attending: Emergency Medicine | Admitting: Emergency Medicine

## 2017-01-01 ENCOUNTER — Encounter (HOSPITAL_COMMUNITY): Payer: Self-pay | Admitting: Emergency Medicine

## 2017-01-01 DIAGNOSIS — F17210 Nicotine dependence, cigarettes, uncomplicated: Secondary | ICD-10-CM | POA: Insufficient documentation

## 2017-01-01 DIAGNOSIS — F10929 Alcohol use, unspecified with intoxication, unspecified: Secondary | ICD-10-CM | POA: Insufficient documentation

## 2017-01-01 DIAGNOSIS — F10920 Alcohol use, unspecified with intoxication, uncomplicated: Secondary | ICD-10-CM

## 2017-01-01 NOTE — ED Provider Notes (Signed)
[facility] DEPT Provider Note   CSN: [id number] Arrival date & time: 01/01/17  2027     History   Chief Complaint Chief Complaint  Patient presents with  . Alcohol Intoxication    HPI Taven Strite is a 46 y.o. male.  Mr. Goins presents to the ED today with alcohol intoxication.  The pt is well known to the ED for frequent visits for the same.  The pt was stealing beer and drinking them in the [employer].  The workers called the police who responded.  When they arrived, they found pt passed out on the grass outside the [place] building.  They brought him here for evaluation.  The pt is intoxicated and has no complaints.      Past Medical History:  Diagnosis Date  . Alcohol abuse   . Alcoholism (HCC)   . Bipolar 1 disorder, depressed (HCC)   . Depression   . Thrombocytopenia [facility])     Patient Active Problem List   Diagnosis Date Noted  . Hypomagnesemia 10/14/2016  . Alcohol withdrawal (HCC) 10/13/2016  . Scalp laceration 05/01/2016  . Acute blood loss anemia 05/01/2016  . Thrombocytopenia (HCC) 05/01/2016  . Alcohol abuse 05/01/2016  . Blunt head trauma 04/28/2016  . Major depressive disorder, recurrent episode, moderate (HCC) 12/13/2015  . 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 Bipolar I disorder (HCC) 02/14/2015  . Alcohol use disorder, severe, dependence (HCC) 02/13/2015    Past Surgical History:  Procedure Laterality Date  . NO PAST SURGERIES         Home Medications    Prior to Admission medications   Medication Sig Start Date End Date Taking? Authorizing Provider  chlordiazePOXIDE (LIBRIUM) 25 MG capsule Take 1 tablet TID x 1 day Take 1 tablet BID x 1 day  Take 1 tablet daily for 2 days Patient not taking: Reported on 12/22/2016 10/17/16   Randel Pigg, Dorma Russell, MD  folic acid (FOLVITE) 1 MG tablet Take 1 tablet (1 mg total) by mouth daily. Patient not taking: Reported on 12/22/2016 10/17/16   Lenox Ponds, MD     Family History Family History  Problem Relation Age of Onset  . Hypertension Mother   . Cancer Father   . Heart failure Father   . Alcoholism Father     Social History Social History  Substance Use Topics  . Smoking status: Current Every Day Smoker    Types: Cigarettes  . Smokeless tobacco: Never Used  . Alcohol use Yes     Comment: daily - "whatever I can get" - 1/5 plus daily     Allergies   Patient has no known allergies.   Review of Systems Review of Systems  All other systems reviewed and are negative.    Physical Exam Updated Vital Signs BP 114/65 (BP Location: Right Arm)   Pulse 96   Temp 98.3 F (36.8 C) (Oral)   Resp (!) 21   SpO2 90%   Physical Exam  Constitutional: He appears well-developed and well-nourished.  Pt smells strongly of alcohol.  HENT:  Head: Normocephalic and atraumatic.  Eyes: Pupils are equal, round, and reactive to light.  Neck: Normal range of motion. Neck supple.  Cardiovascular: Normal rate, regular rhythm, normal heart sounds and intact distal pulses.   Pulmonary/Chest: Effort normal and breath sounds normal.  Abdominal: Soft. Bowel sounds are normal.  Musculoskeletal: Normal range of motion.  Neurological:  Pt is intoxicated and somnolent.  He will wake up to questions,  but then fall back asleep.  He is moving all 4 extremities.  Skin: Skin is warm.  Nursing note and vitals reviewed.    ED Treatments / Results  Labs (all labs ordered are listed, but only abnormal results are displayed) Labs Reviewed - No data to display  EKG  EKG Interpretation None       Radiology No results found.  Procedures Procedures (including critical care time)  Medications Ordered in ED Medications - No data to display   Initial Impression / Assessment and Plan / ED Course  I have reviewed the triage vital signs and the nursing notes.  Pertinent labs & imaging results that were available during my care of the patient were  reviewed by me and considered in my medical decision making (see chart for details).     Plan is for pt to sleep until clinically sober.  He will then be able to be discharged.  Final Clinical Impressions(s) / ED Diagnoses   Final diagnoses:  Alcoholic intoxication without complication [facility](HCC)    New Prescriptions New Prescriptions   No medications on file     Jacalyn LefevreHaviland, Asiana Benninger, MD 01/01/17 2322

## 2017-01-01 NOTE — ED Triage Notes (Signed)
[facility] Pt was stealing beers and drinking them in [facility]. Police responded [facility]  V/s on arrival 132/88, hr 90, rr 16 cbg 239.

## 2017-01-01 NOTE — ED Notes (Signed)
Bed: [facility] Expected date:  Expected time:  Means of arrival:  Comments: EMS/intoxicated 

## 2017-01-02 NOTE — ED Provider Notes (Signed)
12:22 AM Has been ambulatory without difficulty. Eating a sandwich. Appears ready to be discharged.   Jordan Davidson, Jordan RuizJohn, MD 01/02/17 [phone]

## 2017-01-03 ENCOUNTER — Encounter (HOSPITAL_COMMUNITY): Payer: Self-pay

## 2017-01-03 ENCOUNTER — Emergency Department (HOSPITAL_COMMUNITY)
Admission: EM | Admit: 2017-01-03 | Discharge: 2017-01-03 | Discharge status: Against Medical Advice | Payer: Self-pay | Attending: Emergency Medicine | Admitting: Emergency Medicine

## 2017-01-03 DIAGNOSIS — F10920 Alcohol use, unspecified with intoxication, uncomplicated: Secondary | ICD-10-CM | POA: Insufficient documentation

## 2017-01-03 DIAGNOSIS — F17210 Nicotine dependence, cigarettes, uncomplicated: Secondary | ICD-10-CM | POA: Insufficient documentation

## 2017-01-03 MED ORDER — THIAMINE HCL 100 MG/ML IJ SOLN: Freq: Once | INTRAVENOUS | Status: DC

## 2017-01-03 NOTE — ED Triage Notes (Signed)
Patient is intoxicated.

## 2017-01-03 NOTE — ED Provider Notes (Signed)
[facility] Provider Note   CSN: [id number] Arrival date & time: 01/03/17  1310     History   Chief Complaint Chief Complaint  Patient presents with  . Alcohol Intoxication    HPI Jordan Davidson is a 46 y.o. male.  The history is provided by the patient and medical records. No language interpreter was used.   Jordan Davidson is a 46 y.o. male  with a PMH of ETOH abuse who presents to the Emergency Department for evaluation after being found by a bystander behind an [employer] gas station. Patient states he must have fallen asleep out there. He endorses drinking both vodka and beer today. When asked how much, he states "as much as I could". Unable to further quantify amount. He states that he did not drink in an effort to harm himself, but just wanted to drink until he didn't have to think about the bad things going on in his life. Denies SI/HI or auditory / visual hallucinations. No complaints at this time.   Past Medical History:  Diagnosis Date  . Alcohol abuse   . Alcoholism (HCC)   . Bipolar 1 disorder, depressed (HCC)   . Depression   . Thrombocytopenia [facility])     Patient Active Problem List   Diagnosis Date Noted  . Hypomagnesemia 10/14/2016  . Alcohol withdrawal (HCC) 10/13/2016  . Scalp laceration 05/01/2016  . Acute blood loss anemia 05/01/2016  . Thrombocytopenia (HCC) 05/01/2016  . Alcohol abuse 05/01/2016  . Blunt head trauma 04/28/2016  . Major depressive disorder, recurrent episode, moderate (HCC) 12/13/2015  . 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 Bipolar I disorder (HCC) 02/14/2015  . Alcohol use disorder, severe, dependence (HCC) 02/13/2015    Past Surgical History:  Procedure Laterality Date  . NO PAST SURGERIES         Home Medications    Prior to Admission medications   Not on File    Family History Family History  Problem Relation Age of Onset  . Hypertension Mother   . Cancer Father   . Heart failure Father     . Alcoholism Father     Social History Social History  Substance Use Topics  . Smoking status: Current Every Day Smoker    Types: Cigarettes  . Smokeless tobacco: Never Used  . Alcohol use Yes     Comment: daily - "whatever I can get" - 1/5 plus daily     Allergies   Patient has no known allergies.   Review of Systems Review of Systems  Psychiatric/Behavioral: Negative for self-injury.  All other systems reviewed and are negative.    Physical Exam Updated Vital Signs BP (!) 113/59 (BP Location: Left Leg)   Pulse (!) 106   Temp 98.5 F (36.9 C) (Oral)   Resp 18   Ht 6\' 1"  (1.854 m)   Wt 83.9 kg (185 lb)   SpO2 95%   BMI 24.41 kg/m   Physical Exam  Constitutional: He is oriented to person, place, and time. He appears well-developed and well-nourished. No distress.  Smells of alcohol.  HENT:  Head: Normocephalic and atraumatic.  Cardiovascular: Normal rate, regular rhythm and normal heart sounds.   No murmur heard. Pulmonary/Chest: Effort normal and breath sounds normal. No respiratory distress.  Abdominal: Soft. He exhibits no distension. There is no tenderness.  Musculoskeletal:  Moves all extremities well.  Neurological: He is alert and oriented to person, place, and time.  Skin: Skin is warm and dry.  Nursing note and vitals reviewed.    ED Treatments / Results  Labs (all labs ordered are listed, but only abnormal results are displayed) Labs Reviewed - No data to display  EKG  EKG Interpretation None       Radiology No results found.  Procedures Procedures (including critical care time)  Medications Ordered in ED Medications - No data to display   Initial Impression / Assessment and Plan / ED Course  I have reviewed the triage vital signs and the nursing notes.  Pertinent labs & imaging results that were available during my care of the patient were reviewed by me and considered in my medical decision making (see chart for  details).    Jordan Davidson is a 46 y.o. male who presents to ED for ETOH intoxication. On exam, patient in no acute distress and protecting airway appropriately. Patient well known to ED with care plan in place which was reviewed. Started oral rehydration. Patient still clinically intoxicated at shift change. Care assumed by oncoming provider PA hammond who will re-evaluate. Safe for discharge home if no acute events and clinically sober.   Final Clinical Impressions(s) / ED Diagnoses   Final diagnoses:  Alcoholic intoxication without complication [facility](HCC)    New Prescriptions New Prescriptions   No medications on file     Marga Gramajo, Chase PicketJaime Pilcher, PA-C 01/03/17 1655    Alvira MondaySchlossman, Erin, MD 03/22/17 1156

## 2017-01-03 NOTE — ED Notes (Signed)
Patient encouraged to drink fluids. Patients wakes up, drinks a little bit and falls back asleep.

## 2017-01-03 NOTE — ED Notes (Signed)
Bed: [facility] Expected date:  Expected time:  Means of arrival:  Comments: EMS ETOH 

## 2017-01-03 NOTE — ED Notes (Signed)
Pt stated to ED staff that he was "ready to leave now".  Pt and belongings found to now be in his bed.  Unable to locate Pt in the ED.

## 2017-01-03 NOTE — Discharge Instructions (Signed)
Please try to cut back or quit drinking. See attached resource guides for help with this.  Follow up with your primary care doctor.  Return to ER for new or worsening symptoms, any additional concerns.

## 2017-01-03 NOTE — ED Triage Notes (Signed)
Per EMS- Patient was found behind an [employer] [place] and a bystander called EMS. Patient states he drank Vodka, beer, and Listerine.

## 2017-01-04 ENCOUNTER — Emergency Department (HOSPITAL_COMMUNITY)
Admission: EM | Admit: 2017-01-04 | Discharge: 2017-01-04 | Discharge status: Against Medical Advice | Payer: Self-pay | Attending: Emergency Medicine | Admitting: Emergency Medicine

## 2017-01-04 ENCOUNTER — Encounter (HOSPITAL_COMMUNITY): Payer: Self-pay | Admitting: Emergency Medicine

## 2017-01-04 DIAGNOSIS — Z5321 Procedure and treatment not carried out due to patient leaving prior to being seen by health care provider: Secondary | ICD-10-CM | POA: Insufficient documentation

## 2017-01-04 NOTE — ED Triage Notes (Addendum)
Per EMS pt "had a couple of fifths;" pt alert and oriented x4. Pt also complaint of abdominal pain; hx of same over past 3 months; no n/v/d. Pt denies other.

## 2017-01-04 NOTE — ED Notes (Signed)
Pt called for room placement. Pt stated he did not want to come back he would rather sleep it off in the lobby.

## 2017-01-04 NOTE — ED Notes (Signed)
Pt is sleeping in lobby 

## 2017-01-04 NOTE — ED Notes (Signed)
Pt is sleeping in the lobby; breathing equal and unlabored; awaiting available room.

## 2017-01-05 NOTE — ED Provider Notes (Signed)
Jordan Davidson is a 46 y.o. male who presents for intoxication.  I accepted care of this patient at [facility], please see her note for full details.   Briefly he is well known to ED for frequent visits for intoxication.  Plan for oral rehydration and care according to his care plan.    Clinical Course as of Jan 06 1135  Wed Jan 03, 2017  1700 Patient checked, I introduced my self to patient, he is still clinically intoxicated.  Encouraged to drink water  1830 Patient re-checked, still appears clinically intoxicated, says he feels "the same as the last time you bothered me."  Patient offered food and water. He was able to sit up in bed with out assistance to eat.  2020 Patient re-checked, resting comfortably, offered water, he appears to be clinically sober at this time, says he just wants to sleep with out people bothering him.   [EH]     Clinical Course User Index [EH] Cristina GongHammond, Tymel Conely W, PA-C    Jordan Davidson is a 46 y.o. male who is well known to the ED and presents with intoxication.  He repeatedly denies consuming mouthwash or other alcohols that are not intended to be consumed.  He was montiored during his time in the ED and was provided with oral re-hydration in accordance to his care plan.  Patient was clinically sober around 2020, however he eloped before formal discharge or discharge conversations could be had.      Cristina GongHammond, Krzysztof Reichelt W, PA-C 01/05/17 1144    Alvira MondaySchlossman, Erin, MD 01/07/17 2040

## 2017-01-06 ENCOUNTER — Inpatient Hospital Stay (HOSPITAL_COMMUNITY)
Admission: EM | Admit: 2017-01-06 | Discharge: 2017-01-09 | DRG: 897 | Disposition: A | Discharge status: Home / Self Care | Payer: Self-pay | Attending: Internal Medicine | Admitting: Internal Medicine

## 2017-01-06 ENCOUNTER — Encounter (HOSPITAL_COMMUNITY): Payer: Self-pay | Admitting: Emergency Medicine

## 2017-01-06 DIAGNOSIS — R079 Chest pain, unspecified: Secondary | ICD-10-CM | POA: Diagnosis present

## 2017-01-06 DIAGNOSIS — E440 Moderate protein-calorie malnutrition: Secondary | ICD-10-CM | POA: Diagnosis present

## 2017-01-06 DIAGNOSIS — F1020 Alcohol dependence, uncomplicated: Secondary | ICD-10-CM | POA: Diagnosis present

## 2017-01-06 DIAGNOSIS — R739 Hyperglycemia, unspecified: Secondary | ICD-10-CM | POA: Diagnosis present

## 2017-01-06 DIAGNOSIS — G4089 Other seizures: Secondary | ICD-10-CM | POA: Diagnosis present

## 2017-01-06 DIAGNOSIS — Z6820 Body mass index (BMI) 20.0-20.9, adult: Secondary | ICD-10-CM

## 2017-01-06 DIAGNOSIS — F19230 Other psychoactive substance dependence with withdrawal, uncomplicated: Secondary | ICD-10-CM

## 2017-01-06 DIAGNOSIS — F10939 Alcohol use, unspecified with withdrawal, unspecified: Secondary | ICD-10-CM | POA: Diagnosis present

## 2017-01-06 DIAGNOSIS — Z716 Tobacco abuse counseling: Secondary | ICD-10-CM

## 2017-01-06 DIAGNOSIS — F19930 Other psychoactive substance use, unspecified with withdrawal, uncomplicated: Secondary | ICD-10-CM

## 2017-01-06 DIAGNOSIS — Z811 Family history of alcohol abuse and dependence: Secondary | ICD-10-CM

## 2017-01-06 DIAGNOSIS — R197 Diarrhea, unspecified: Secondary | ICD-10-CM | POA: Diagnosis present

## 2017-01-06 DIAGNOSIS — Z8249 Family history of ischemic heart disease and other diseases of the circulatory system: Secondary | ICD-10-CM

## 2017-01-06 DIAGNOSIS — I4581 Long QT syndrome: Secondary | ICD-10-CM | POA: Diagnosis present

## 2017-01-06 DIAGNOSIS — F319 Bipolar disorder, unspecified: Secondary | ICD-10-CM | POA: Diagnosis present

## 2017-01-06 DIAGNOSIS — R569 Unspecified convulsions: Secondary | ICD-10-CM

## 2017-01-06 DIAGNOSIS — Z590 Homelessness: Secondary | ICD-10-CM

## 2017-01-06 DIAGNOSIS — R030 Elevated blood-pressure reading, without diagnosis of hypertension: Secondary | ICD-10-CM | POA: Diagnosis present

## 2017-01-06 DIAGNOSIS — F331 Major depressive disorder, recurrent, moderate: Secondary | ICD-10-CM | POA: Diagnosis present

## 2017-01-06 DIAGNOSIS — F17210 Nicotine dependence, cigarettes, uncomplicated: Secondary | ICD-10-CM | POA: Diagnosis present

## 2017-01-06 DIAGNOSIS — F10239 Alcohol dependence with withdrawal, unspecified: Principal | ICD-10-CM | POA: Diagnosis present

## 2017-01-06 DIAGNOSIS — D696 Thrombocytopenia, unspecified: Secondary | ICD-10-CM | POA: Diagnosis present

## 2017-01-06 LAB — CBC WITH DIFFERENTIAL/PLATELET
BASOS PCT: 0 %
Basophils Absolute: 0 10*3/uL (ref 0.0–0.1)
EOS ABS: 0 10*3/uL (ref 0.0–0.7)
Eosinophils Relative: 0 %
HCT: 39 % (ref 39.0–52.0)
HEMOGLOBIN: 13.8 g/dL (ref 13.0–17.0)
LYMPHS ABS: 0.5 10*3/uL — AB (ref 0.7–4.0)
Lymphocytes Relative: 7 %
MCH: 31.7 pg (ref 26.0–34.0)
MCHC: 35.4 g/dL (ref 30.0–36.0)
MCV: 89.7 fL (ref 78.0–100.0)
Monocytes Absolute: 1 10*3/uL (ref 0.1–1.0)
Monocytes Relative: 14 %
NEUTROS PCT: 78 %
Neutro Abs: 5.3 10*3/uL (ref 1.7–7.7)
Platelets: 31 10*3/uL — ABNORMAL LOW (ref 150–400)
RBC: 4.35 MIL/uL (ref 4.22–5.81)
RDW: 15 % (ref 11.5–15.5)
WBC: 6.8 10*3/uL (ref 4.0–10.5)

## 2017-01-06 LAB — BASIC METABOLIC PANEL
Anion gap: 8 (ref 5–15)
BUN: 7 mg/dL (ref 6–20)
CHLORIDE: 97 mmol/L — AB (ref 101–111)
CO2: 30 mmol/L (ref 22–32)
CREATININE: 0.67 mg/dL (ref 0.61–1.24)
Calcium: 9.2 mg/dL (ref 8.9–10.3)
GFR calc non Af Amer: >60 mL/min (ref >60)
Glucose, Bld: 132 mg/dL — ABNORMAL HIGH (ref 65–99)
Potassium: 3.7 mmol/L (ref 3.5–5.1)
SODIUM: 135 mmol/L (ref 135–145)

## 2017-01-06 LAB — ETHANOL: Alcohol, Ethyl (B): <5 mg/dL (ref <5)

## 2017-01-06 MED ORDER — LORAZEPAM 2 MG/ML IJ SOLN: 0.0000 mg | Freq: Four times a day (QID) | INTRAMUSCULAR | Status: DC

## 2017-01-06 MED ORDER — LORAZEPAM 1 MG PO TABS
0.0000 mg | ORAL_TABLET | Freq: Four times a day (QID) | ORAL | Status: DC
  Administered 2017-01-06: 2 mg via ORAL
  Filled 2017-01-06: qty 2

## 2017-01-06 MED ORDER — VITAMIN B-1 100 MG PO TABS
100.0000 mg | ORAL_TABLET | Freq: Every day | ORAL | Status: DC
  Administered 2017-01-06: 100 mg via ORAL
  Filled 2017-01-06: qty 1

## 2017-01-06 MED ORDER — NICOTINE 14 MG/24HR TD PT24
14.0000 mg | MEDICATED_PATCH | Freq: Every day | TRANSDERMAL | Status: DC
  Administered 2017-01-07 – 2017-01-08 (×3): 14 mg via TRANSDERMAL
  Filled 2017-01-06 (×4): qty 1

## 2017-01-06 MED ORDER — LORAZEPAM 2 MG/ML IJ SOLN: 0.0000 mg | Freq: Two times a day (BID) | INTRAMUSCULAR | Status: DC

## 2017-01-06 MED ORDER — LORAZEPAM 1 MG PO TABS: 0.0000 mg | ORAL_TABLET | Freq: Two times a day (BID) | ORAL | Status: DC

## 2017-01-06 MED ORDER — THIAMINE HCL 100 MG/ML IJ SOLN: 100.0000 mg | Freq: Every day | INTRAMUSCULAR | Status: DC

## 2017-01-06 NOTE — ED Provider Notes (Signed)
[facility]-EMERGENCY DEPT Provider Note   CSN: [id number] Arrival date & time: 01/06/17  1931     History   Chief Complaint Chief Complaint  Patient presents with  . Seizures    HPI Jordan Davidson is a 46 y.o. male.  HPI 46 year old male past medical history significant for depression, bipolar disorder, alcohol abuse presents to the ED today with an apparent withdrawal seizure. The patient states that he drinks "a significant amount of alcohol" a day. Cannot give me an exact amount. Patient is homeless and states that he stays in abandoned tractor-trailer's. Patient was arrested yesterday for intoxication. He states that his last drink was at approximately 5:00 yesterday afternoon. Patient was released this afternoon. Associated was released he was walking and was noticed to have had an apparent seizure by bystanders per EMS. Patient states that he really does want to get sober and would like some help. Patient is well-known to the ED for alcohol intoxication. He denies any other drug use or tobacco use. Patient denies any other complaints at this time. Denies hitting his head. He denies any urinary incontinence or oral trauma. Past Medical History:  Diagnosis Date  . Alcohol abuse   . Alcoholism (HCC)   . Bipolar 1 disorder, depressed (HCC)   . Depression   . Thrombocytopenia [facility])     Patient Active Problem List   Diagnosis Date Noted  . Hypomagnesemia 10/14/2016  . Alcohol withdrawal (HCC) 10/13/2016  . Scalp laceration 05/01/2016  . Acute blood loss anemia 05/01/2016  . Thrombocytopenia (HCC) 05/01/2016  . Alcohol abuse 05/01/2016  . Blunt head trauma 04/28/2016  . Major depressive disorder, recurrent episode, moderate (HCC) 12/13/2015  . 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 Bipolar I disorder (HCC) 02/14/2015  . Alcohol use disorder, severe, dependence (HCC) 02/13/2015    Past Surgical History:  Procedure Laterality Date  . NO PAST SURGERIES          Home Medications    Prior to Admission medications   Not on File    Family History Family History  Problem Relation Age of Onset  . Hypertension Mother   . Cancer Father   . Heart failure Father   . Alcoholism Father     Social History Social History  Substance Use Topics  . Smoking status: Current Every Day Smoker    Types: Cigarettes  . Smokeless tobacco: Never Used  . Alcohol use Yes     Comment: daily - "whatever I can get" - 1/5 plus daily     Allergies   Patient has no known allergies.   Review of Systems Review of Systems  Constitutional: Negative for chills and fever.  HENT: Negative for congestion and sore throat.   Eyes: Negative for visual disturbance.  Respiratory: Negative for cough and shortness of breath.   Cardiovascular: Negative for chest pain.  Gastrointestinal: Negative for abdominal pain, diarrhea, nausea and vomiting.  Genitourinary: Negative for dysuria, flank pain, frequency, hematuria, scrotal swelling, testicular pain and urgency.  Musculoskeletal: Negative for arthralgias and myalgias.  Skin: Negative for rash.  Neurological: Positive for seizures. Negative for dizziness, syncope, weakness, light-headedness, numbness and headaches.  Psychiatric/Behavioral: Negative for sleep disturbance. The patient is not nervous/anxious.      Physical Exam Updated Vital Signs BP (!) 151/94 (BP Location: Right Arm)   Pulse 83   Temp 98.1 F (36.7 C) (Oral)   Resp 16   Ht 6\' 1"  (1.854 m)   Wt 83.9 kg (185 lb)  SpO2 93%   BMI 24.41 kg/m   Physical Exam  Constitutional: He is oriented to person, place, and time. He appears well-developed and well-nourished.  Non-toxic appearance. No distress.  Appears older than stated age. Chronically ill-appearing.  HENT:  Head: Normocephalic and atraumatic.  Mouth/Throat: Oropharynx is clear and moist.  No septal hematoma. No bilateral hemotympanum.  Eyes: Pupils are equal, round, and reactive  to light. Conjunctivae are normal. Right eye exhibits no discharge. Left eye exhibits no discharge.  Neck: Normal range of motion. Neck supple.  No c spine midline tenderness. No paraspinal tenderness. No deformities or step offs noted. Full ROM. Supple. No nuchal rigidity.    Cardiovascular: Normal rate, regular rhythm, normal heart sounds and intact distal pulses.  Exam reveals no gallop and no friction rub.   No murmur heard. Pulmonary/Chest: Effort normal and breath sounds normal. No respiratory distress. He has no wheezes. He has no rales. He exhibits no tenderness.  Abdominal: Soft. Bowel sounds are normal. He exhibits no distension. There is no tenderness. There is no rebound and no guarding.  Musculoskeletal: Normal range of motion. He exhibits no tenderness.  No midline T spine or L spine tenderness. No deformities or step offs noted. Full ROM. Pelvis is stable.   Lymphadenopathy:    He has no cervical adenopathy.  Neurological: He is alert and oriented to person, place, and time.  Skin: Skin is warm and dry. Capillary refill takes less than 2 seconds. No rash noted.  Psychiatric: His behavior is normal. Judgment and thought content normal.  Nursing note and vitals reviewed.    ED Treatments / Results  Labs (all labs ordered are listed, but only abnormal results are displayed) Labs Reviewed  BASIC METABOLIC PANEL - Abnormal; Notable for the following:       Result Value   Chloride 97 (*)    Glucose, Bld 132 (*)    All other components within normal limits  CBC WITH DIFFERENTIAL/PLATELET - Abnormal; Notable for the following:    Platelets 31 (*)    Lymphs Abs 0.5 (*)    All other components within normal limits  ETHANOL    EKG  EKG Interpretation None       Radiology No results found.  Procedures Procedures (including critical care time)  Medications Ordered in ED Medications  LORazepam (ATIVAN) injection 0-4 mg ( Intravenous See Alternative 01/06/17 2103)     Or  LORazepam (ATIVAN) tablet 0-4 mg (2 mg Oral Given 01/06/17 2103)  LORazepam (ATIVAN) injection 0-4 mg (not administered)    Or  LORazepam (ATIVAN) tablet 0-4 mg (not administered)  thiamine (VITAMIN B-1) tablet 100 mg (100 mg Oral Given 01/06/17 2105)    Or  thiamine (B-1) injection 100 mg ( Intravenous See Alternative 01/06/17 2105)     Initial Impression / Assessment and Plan / ED Course  I have reviewed the triage vital signs and the nursing notes.  Pertinent labs & imaging results that were available during my care of the patient were reviewed by me and considered in my medical decision making (see chart for details).     Patient presents to the ED with apparent withdrawal seizure. States that his last drink was yesterday at 5 PM. Has been in jail overnight. Left jail today and was witnessed having a seizure by bystanders.  On exam patient does have tremors. CIWA 13. Patient has no signs of trauma from fall. He has no specific complaints. He states that he does want help  with detox. Patient was given Ativan per CIWA protocol. Labs were obtained. Ethanol was less than 5. He does have a thrombocytopenia without history of same. Will need close follow-up with his primary care doctor for recheck. Overall patient is well-appearing. Vital signs are stable.  Spoke with Dr. Ophelia Charter with hospital medicine who agrees to admit pt to the hospital for observation patient remains hemodynamically stable. Updated on plan of care and agreeable to admission..   Final Clinical Impressions(s) / ED Diagnoses   Final diagnoses:  Drug withdrawal seizure without complication [facility])    New Prescriptions New Prescriptions   No medications on file     Wallace Keller 01/06/17 2257    Abelino Derrick, MD 01/07/17 [phone]

## 2017-01-06 NOTE — H&P (Addendum)
History and Physical    Jordan Davidson ZOX:[id number] DOB: 02-20-1971 DOA: 01/06/2017  PCP: Lavinia Sharps, NP Consultants:  None Patient coming from: Homeless; NOK: mother   Chief Complaint: seizure  HPI: Jordan Davidson is a 46 y.o. male with medical history significant of alcohol dependence with h/o withdrawal seizures; bipolar depression; and thrombocytopenia presenting after he had a seizure from alcohol withdrawal..  He has had this happen many times in the past.  His last drink was yesterday morning.  He was in jail for alcohol intoxication - he was placed on a 24 hour hold - and after release had a seizure witnessed by a bystander.  Only 1 seizure today.  Feeling a little bit shaky now, nausea, sweaty.  He reports that he's "tired of living like this" and wants to stop drinking.  He has been to rehab and halfway house programs "many times" in the past.   ED Course: CIWA 13; wants help with detox.  Given Ativan per CIWA protocol.    Review of Systems: As per HPI; otherwise review of systems reviewed and negative.   Ambulatory Status:  Ambulates without assistance but "I need something"  Past Medical History:  Diagnosis Date  . Alcoholism (HCC)   . Bipolar 1 disorder, depressed (HCC)   . Depression   . Thrombocytopenia (HCC)     Past Surgical History:  Procedure Laterality Date  . NO PAST SURGERIES      Social History   Social History  . Marital status: Single    Spouse name: N/A  . Number of children: N/A  . Years of education: N/A   Occupational History  . 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 Smoking status: Current Every Day Smoker    Packs/day: 1.00    Years: 38.00    Types: Cigarettes  . Smokeless tobacco: Never Used  . Alcohol use Yes     Comment: daily - "a lot... as much as I can until I pass out" - 1/5 plus daily  . Drug use: No  . Sexual activity: No   Other Topics Concern  . Not on file   Social History Narrative     ** Merged History Encounter **       ** Merged History Encounter **        No Known Allergies  Family History  Problem Relation Age of Onset  . Hypertension Mother   . Cancer Father   . Heart failure Father   . Alcoholism Father     Prior to Admission medications   Not on File    Physical Exam: Vitals:   01/06/17 1942 01/06/17 1944 01/06/17 2200  BP:  (!) 155/99 (!) 151/94  Pulse:  96 83  Resp:  18 16  Temp:  98.1 F (36.7 C)   TempSrc:  Oral   SpO2:  92% 93%  Weight: 83.9 kg (185 lb)    Height: 6\' 1"  (1.854 m)       General: Appears calm and comfortable and is NAD; he appears fatigued Eyes:  PERRL, EOMI, normal lids, iris ENT:  grossly normal hearing, lips & tongue, mmm Neck:  no LAD, masses or thyromegaly Cardiovascular:  RRR, no m/r/g. No LE edema.  Respiratory:  CTA bilaterally, no w/r/r. Normal respiratory effort. Abdomen:  soft, ntnd, NABS Skin:  no rash or induration seen on limited exam Musculoskeletal:  grossly normal tone BUE/BLE, good ROM, no bony abnormality Psychiatric:  depressed mood and affect, speech fluent and appropriate, AOx3 Neurologic:  CN 2-12 grossly intact, moves all extremities in coordinated fashion, sensation intact  Labs on Admission: I have personally reviewed following labs and imaging studies  CBC:  Recent Labs Lab 01/06/17 2105  WBC 6.8  NEUTROABS 5.3  HGB 13.8  HCT 39.0  MCV 89.7  PLT 31*   Basic Metabolic Panel:  Recent Labs Lab 01/06/17 2105  NA 135  K 3.7  CL 97*  CO2 30  GLUCOSE 132*  BUN 7  CREATININE 0.67  CALCIUM 9.2   GFR: Estimated Creatinine Clearance: 127.6 mL/min (by C-G formula based on SCr of 0.67 mg/dL). Liver Function Tests: No results for input(s): AST, ALT, ALKPHOS, BILITOT, PROT, ALBUMIN in the last 168 hours. No results for input(s): LIPASE, AMYLASE in the last 168 hours. No results for input(s): AMMONIA in the last 168 hours. Coagulation Profile: No results for input(s): INR,  PROTIME in the last 168 hours. Cardiac Enzymes: No results for input(s): CKTOTAL, CKMB, CKMBINDEX, TROPONINI in the last 168 hours. BNP (last 3 results) No results for input(s): PROBNP in the last 8760 hours. HbA1C: No results for input(s): HGBA1C in the last 72 hours. CBG: No results for input(s): GLUCAP in the last 168 hours. Lipid Profile: No results for input(s): CHOL, HDL, LDLCALC, TRIG, CHOLHDL, LDLDIRECT in the last 72 hours. Thyroid Function Tests: No results for input(s): TSH, T4TOTAL, FREET4, T3FREE, THYROIDAB in the last 72 hours. Anemia Panel: No results for input(s): VITAMINB12, FOLATE, FERRITIN, TIBC, IRON, RETICCTPCT in the last 72 hours. Urine analysis:    Component Value Date/Time   COLORURINE AMBER (A) 10/14/2016 0158   APPEARANCEUR HAZY (A) 10/14/2016 0158   APPEARANCEUR Clear 10/23/2014 1607   LABSPEC 1.025 10/14/2016 0158   LABSPEC 1.002 10/23/2014 1607   PHURINE 7.0 10/14/2016 0158   GLUCOSEU NEGATIVE 10/14/2016 0158   GLUCOSEU Negative 10/23/2014 1607   HGBUR SMALL (A) 10/14/2016 0158   BILIRUBINUR NEGATIVE 10/14/2016 0158   BILIRUBINUR Negative 10/23/2014 1607   KETONESUR 20 (A) 10/14/2016 0158   PROTEINUR 100 (A) 10/14/2016 0158   UROBILINOGEN 1.0 09/13/2014 0827   NITRITE NEGATIVE 10/14/2016 0158   LEUKOCYTESUR NEGATIVE 10/14/2016 0158   LEUKOCYTESUR Negative 10/23/2014 1607    Creatinine Clearance: Estimated Creatinine Clearance: 127.6 mL/min (by C-G formula based on SCr of 0.67 mg/dL).  Sepsis Labs: @LABRCNTIP (procalcitonin:4,lacticidven:4) )No results found for this or any previous visit (from the past 240 hour(s)).   Radiological Exams on Admission: No results found.  EKG: not done  Assessment/Plan Principal Problem:   Alcohol withdrawal seizure (HCC) Active Problems:   Alcohol use disorder, severe, dependence (HCC)   Bipolar I disorder (HCC)   Thrombocytopenia (HCC)   Alcohol withdrawal (HCC)   Hyperglycemia   Alcohol  withdrawal seizure with prolonged h/o severe alcohol dependence -Patient with long-standing h/o ETOH dependence -His last drink was yesterday prior to being picked up for alcohol intoxication and placed on a 24 hour hold -Reportedly had a witnessed seizure after release from jail -Patient reports long-standing h/o withdrawal seizures in the past - this is not evident by reivew of Epic including Care Everywhere -While it is possible this episode was otherwise, it appears to be very likely related to ETOH withdrawal -Based on multiple episodes of withdrawal in the past, his risk for serious DTs is quite high -Will admit to telemetry with CIWA protocol -He is at increased risk for needing Precedex to help him get through DTs -Folate and thiamine ordered -  Patient reports desire to quit.  Will likely need long-term treatment program.   -SW consult requested. -Will also check UDS. -Recent normal LFTs, not repeated today  Bipolar -Patient likely needs his psych issues addressed concurrently to maximize his likelihood of long-term abstinence.  Hypergylcemia -Glucose 132 -May be stress response -Will follow with fasting AM labs  Thrombocytopenia -Platelets 31 - Due to bone marrow suppression from alcohol abuse - Monitor daily platelet count   Tobacco Dependence -Encourage cessation.  This was discussed with the patient and should be reviewed on an ongoing basis.   -Patch ordered   DVT prophylaxis: SCDs Code Status:  Full - confirmed with patient/family Family Communication: None present  Disposition Plan:  To be determined Consults called: Social work Admission status: It is my clinical opinion that referral for OBSERVATION is reasonable and necessary in this patient based on the above information provided. The aforementioned taken together are felt to place the patient at high risk for further clinical deterioration. However it is anticipated that the patient may be medically stable for  discharge from the hospital within 24 to 48 hours.    Jonah BlueJennifer Vernecia Umble MD [facility]  If 7PM-7AM, please contact night-coverage [email or web address] Password [email or web address]  01/06/2017, 11:45 PM

## 2017-01-06 NOTE — ED Notes (Signed)
Bed: EA54WA11 Expected date:  Expected time:  Means of arrival:  Comments: 46yo M seizure

## 2017-01-06 NOTE — ED Triage Notes (Signed)
Brought in by EMS after pt was observed "having a seizure" by a bystander.  Pt arrived to ED A/Ox4--- pt reported, "I'm sure it's a withdrawal seizure.  I was released from jail today for intoxication... And I had my last drink sometime yesterday".  Pt is a known alcohol abuser.

## 2017-01-07 ENCOUNTER — Encounter (HOSPITAL_COMMUNITY): Payer: Self-pay

## 2017-01-07 ENCOUNTER — Observation Stay (HOSPITAL_BASED_OUTPATIENT_CLINIC_OR_DEPARTMENT_OTHER): Payer: Self-pay

## 2017-01-07 DIAGNOSIS — F1020 Alcohol dependence, uncomplicated: Secondary | ICD-10-CM

## 2017-01-07 DIAGNOSIS — F10230 Alcohol dependence with withdrawal, uncomplicated: Secondary | ICD-10-CM

## 2017-01-07 DIAGNOSIS — R072 Precordial pain: Secondary | ICD-10-CM

## 2017-01-07 DIAGNOSIS — D696 Thrombocytopenia, unspecified: Secondary | ICD-10-CM

## 2017-01-07 LAB — RAPID URINE DRUG SCREEN, HOSP PERFORMED
Amphetamines: NOT DETECTED
Barbiturates: NOT DETECTED
Benzodiazepines: NOT DETECTED
COCAINE: NOT DETECTED
OPIATES: NOT DETECTED
TETRAHYDROCANNABINOL: NOT DETECTED

## 2017-01-07 LAB — TROPONIN I
Troponin I: <0.03 ng/mL (ref <0.03)
Troponin I: <0.03 ng/mL (ref <0.03)

## 2017-01-07 LAB — ECHOCARDIOGRAM COMPLETE
HEIGHTINCHES: 72 in
Weight: 2468.8 oz

## 2017-01-07 MED ORDER — FOLIC ACID 1 MG PO TABS
1.0000 mg | ORAL_TABLET | Freq: Every day | ORAL | Status: DC
  Administered 2017-01-07 – 2017-01-09 (×3): 1 mg via ORAL
  Filled 2017-01-07 (×3): qty 1

## 2017-01-07 MED ORDER — ACETAMINOPHEN 650 MG RE SUPP: 650.0000 mg | RECTAL | Status: DC | PRN

## 2017-01-07 MED ORDER — LORAZEPAM 2 MG/ML IJ SOLN
1.0000 mg | Freq: Four times a day (QID) | INTRAMUSCULAR | Status: DC | PRN
  Administered 2017-01-08 – 2017-01-09 (×2): 1 mg via INTRAVENOUS
  Filled 2017-01-07 (×2): qty 1

## 2017-01-07 MED ORDER — ONDANSETRON HCL 4 MG/2ML IJ SOLN
4.0000 mg | Freq: Four times a day (QID) | INTRAMUSCULAR | Status: DC | PRN
  Administered 2017-01-07: 4 mg via INTRAVENOUS
  Filled 2017-01-07: qty 2

## 2017-01-07 MED ORDER — LORAZEPAM 2 MG/ML IJ SOLN
0.0000 mg | Freq: Four times a day (QID) | INTRAMUSCULAR | Status: AC
  Administered 2017-01-07 – 2017-01-08 (×7): 2 mg via INTRAVENOUS
  Administered 2017-01-08: 1 mg via INTRAVENOUS
  Filled 2017-01-07 (×8): qty 1

## 2017-01-07 MED ORDER — VITAMIN B-1 100 MG PO TABS
100.0000 mg | ORAL_TABLET | Freq: Every day | ORAL | Status: DC
  Administered 2017-01-07 – 2017-01-09 (×3): 100 mg via ORAL
  Filled 2017-01-07 (×3): qty 1

## 2017-01-07 MED ORDER — LORAZEPAM 1 MG PO TABS: 1.0000 mg | ORAL_TABLET | Freq: Four times a day (QID) | ORAL | Status: DC | PRN

## 2017-01-07 MED ORDER — THIAMINE HCL 100 MG/ML IJ SOLN: 100.0000 mg | Freq: Every day | INTRAMUSCULAR | Status: DC

## 2017-01-07 MED ORDER — DOCUSATE SODIUM 100 MG PO CAPS
100.0000 mg | ORAL_CAPSULE | Freq: Two times a day (BID) | ORAL | Status: DC
  Filled 2017-01-07 (×2): qty 1

## 2017-01-07 MED ORDER — LORAZEPAM 2 MG/ML IJ SOLN
0.0000 mg | Freq: Two times a day (BID) | INTRAMUSCULAR | Status: DC
  Administered 2017-01-09: 2 mg via INTRAVENOUS
  Filled 2017-01-07: qty 1

## 2017-01-07 MED ORDER — SODIUM CHLORIDE 0.9 % IV SOLN
75.0000 mL/h | INTRAVENOUS | Status: DC
  Administered 2017-01-07 – 2017-01-09 (×4): 75 mL/h via INTRAVENOUS

## 2017-01-07 MED ORDER — ADULT MULTIVITAMIN W/MINERALS CH
1.0000 | ORAL_TABLET | Freq: Every day | ORAL | Status: DC
  Administered 2017-01-07 – 2017-01-09 (×3): 1 via ORAL
  Filled 2017-01-07 (×3): qty 1

## 2017-01-07 MED ORDER — ACETAMINOPHEN 325 MG PO TABS: 650.0000 mg | ORAL_TABLET | ORAL | Status: DC | PRN

## 2017-01-07 MED ORDER — ONDANSETRON HCL 4 MG PO TABS: 4.0000 mg | ORAL_TABLET | Freq: Four times a day (QID) | ORAL | Status: DC | PRN

## 2017-01-07 MED ORDER — LORAZEPAM 2 MG/ML IJ SOLN: 1.0000 mg | INTRAMUSCULAR | Status: DC | PRN

## 2017-01-07 MED ORDER — ENSURE ENLIVE PO LIQD
237.0000 mL | Freq: Two times a day (BID) | ORAL | Status: DC
  Administered 2017-01-07 – 2017-01-08 (×3): 237 mL via ORAL

## 2017-01-07 NOTE — Plan of Care (Signed)
Problem: Health Behavior/Discharge Planning: Goal: Ability to manage health-related needs will improve Outcome: Progressing Case worker consult

## 2017-01-07 NOTE — Progress Notes (Signed)
PROGRESS NOTE                                                                                                                                                                                                             Patient Demographics:    Jordan Davidson, is a 46 y.o. male, DOB - 1971/05/11, ZOX:[id number]  Admit date - 01/06/2017   Admitting Physician Jonah Blue, MD  Outpatient Primary MD for the patient is Placey, Chales Abrahams, NP  LOS - 0  Outpatient Specialists:None  Chief Complaint  Patient presents with  . Seizures       Brief Narrative  46 year old male with history of valve codependence with history of withdrawal seizures, bipolar depression and thrombocytopenia associated: Is present in the seizures in the setting of alcohol withdrawal. His last drink was on the morning prior to admission. Reportedly he was in jail for alcohol intoxication and was paced on hold for 24 hours. After being released he had a witnessed seizure by a bystander. Patient has been and halfway house rehabilitation program several times in the past. He reports being homeless currently.  In the ED his CIWA was 13. He was afebrile. Blood work showed low platelets of 31, normal electrolytes and LFTs. Patient placed on observation for acute alcohol withdrawal and seizures.   Subjective:   Patient complains of left-sided shoulder pain radiating down to the arm. Reports symptoms frequently when he has alcohol withdrawal. No shortness of breath, nausea or vomiting.   Assessment  & Plan :    Principal Problem:   Alcohol withdrawal seizure (HCC) Monitor on CIWA. When necessary Ativan. CIWA  of 4 this morning. Added thiamine, folate and multivitamin. He is interested in getting into a rehabilitation. Social work consulted. Seizure precautions.  Active Problems: Chest pain Both typical and atypical symptoms. EKG unremarkable except for prolonged  QTC (492). Cycle enzymes. Check 2-D echo to rule out wall motion abnormality.     Alcohol use disorder, severe, dependence (HCC) Social work consulted.    Bipolar I disorder (HCC) Not on any medication. Was previously on Zyprexa and Neurontin. Will benefit from outpatient psychiatry referral.    Thrombocytopenia (HCC) Appears secondary to bone marrow suppression with alcohol use. Has had low platelets in the past. Monitor closely.  Elevated blood pressure   secondary to alcohol withdrawal. Monitor for  now.    Code Status : Full code  Family Communication  : None at bedside  Disposition: Possibly needs alcohol rehabilitation  Barriers For Discharge : Active symptoms  Consults  :  None  Procedures  : None  DVT Prophylaxis  :  Lovenox -   Lab Results  Component Value Date   PLT 31 (L) 01/06/2017    Antibiotics  :   Anti-infectives    None        Objective:   Vitals:   01/07/17 0116 01/07/17 0130 01/07/17 0502 01/07/17 0800  BP: (!) 154/108 (!) 156/96 (!) 142/87 (!) 144/74  Pulse: 79 91 89 91  Resp: 20  18   Temp: 98.2 F (36.8 C)  98.9 F (37.2 C) 98.7 F (37.1 C)  TempSrc: Oral  Oral Oral  SpO2: 98%  99% 98%  Weight: 70 kg (154 lb 4.8 oz)     Height: 6' (1.829 m)       Wt Readings from Last 3 Encounters:  01/07/17 70 kg (154 lb 4.8 oz)  01/03/17 83.9 kg (185 lb)  12/13/16 83.9 kg (185 lb)     Intake/Output Summary (Last 24 hours) at 01/07/17 1049 Last data filed at 01/07/17 0700  Gross per 24 hour  Intake              840 ml  Output              400 ml  Net              440 ml     Physical Exam Gen.: Middle aged male, appears fatigued, not in distress  HEENT: No pallor, moist mucosa, supple neck Chest: Clear bilaterally CVS: Normal S1 and S2, no murmurs rub or gallop GI: Soft, nondistended, nontender Muscular skeletal: Warm, no edema CNS: Alert and oriented, tremulous    Data Review:    CBC  Recent Labs Lab 01/06/17 2105  WBC  6.8  HGB 13.8  HCT 39.0  PLT 31*  MCV 89.7  MCH 31.7  MCHC 35.4  RDW 15.0  LYMPHSABS 0.5*  MONOABS 1.0  EOSABS 0.0  BASOSABS 0.0    Chemistries   Recent Labs Lab 01/06/17 2105  NA 135  K 3.7  CL 97*  CO2 30  GLUCOSE 132*  BUN 7  CREATININE 0.67  CALCIUM 9.2   ------------------------------------------------------------------------------------------------------------------ No results for input(s): CHOL, HDL, LDLCALC, TRIG, CHOLHDL, LDLDIRECT in the last 72 hours.  Lab Results  Component Value Date   HGBA1C 5.7 (H) 02/15/2015   ------------------------------------------------------------------------------------------------------------------ No results for input(s): TSH, T4TOTAL, T3FREE, THYROIDAB in the last 72 hours.  Invalid input(s): FREET3 ------------------------------------------------------------------------------------------------------------------ No results for input(s): VITAMINB12, FOLATE, FERRITIN, TIBC, IRON, RETICCTPCT in the last 72 hours.  Coagulation profile No results for input(s): INR, PROTIME in the last 168 hours.  No results for input(s): DDIMER in the last 72 hours.  Cardiac Enzymes  Recent Labs Lab 01/07/17 0938  TROPONINI <0.03   ------------------------------------------------------------------------------------------------------------------ No results found for: BNP  Inpatient Medications  Scheduled Meds: . docusate sodium  100 mg Oral BID  . feeding supplement (ENSURE ENLIVE)  237 mL Oral BID BM  . folic acid  1 mg Oral Daily  . LORazepam  0-4 mg Intravenous Q6H   Followed by  . [START ON 01/09/2017] LORazepam  0-4 mg Intravenous Q12H  . multivitamin with minerals  1 tablet Oral Daily  . nicotine  14 mg Transdermal Daily  . thiamine  100 mg Oral  Daily   Or  . thiamine  100 mg Intravenous Daily   Continuous Infusions: . sodium chloride 75 mL/hr (01/07/17 0200)   PRN Meds:.acetaminophen **OR** acetaminophen, LORazepam  **OR** LORazepam, LORazepam, ondansetron **OR** ondansetron (ZOFRAN) IV  Micro Results No results found for this or any previous visit (from the past 240 hour(s)).  Radiology Reports No results found.  Time Spent in minutes  25   Eddie North M.D on 01/07/2017 at 10:49 AM  Between 7am to 7pm - Pager - [phone]  After 7pm go to [email or web address] - password [email or web address]  [facility] -  Office  [phone]

## 2017-01-07 NOTE — Progress Notes (Signed)
  Echocardiogram 2D Echocardiogram has been performed.  Janalyn HarderWest, Frankie Zito R 01/07/2017, 1:12 PM

## 2017-01-08 LAB — CBC
HCT: 40.1 % (ref 39.0–52.0)
HEMOGLOBIN: 13.9 g/dL (ref 13.0–17.0)
MCH: 31.2 pg (ref 26.0–34.0)
MCHC: 34.7 g/dL (ref 30.0–36.0)
MCV: 89.9 fL (ref 78.0–100.0)
Platelets: 34 10*3/uL — ABNORMAL LOW (ref 150–400)
RBC: 4.46 MIL/uL (ref 4.22–5.81)
RDW: 15 % (ref 11.5–15.5)
WBC: 5.2 10*3/uL (ref 4.0–10.5)

## 2017-01-08 LAB — BASIC METABOLIC PANEL
Anion gap: 9 (ref 5–15)
BUN: 11 mg/dL (ref 6–20)
CHLORIDE: 100 mmol/L — AB (ref 101–111)
CO2: 28 mmol/L (ref 22–32)
Calcium: 9.3 mg/dL (ref 8.9–10.3)
Creatinine, Ser: 0.73 mg/dL (ref 0.61–1.24)
GFR calc non Af Amer: >60 mL/min (ref >60)
Glucose, Bld: 107 mg/dL — ABNORMAL HIGH (ref 65–99)
POTASSIUM: 4.3 mmol/L (ref 3.5–5.1)
Sodium: 137 mmol/L (ref 135–145)

## 2017-01-08 MED ORDER — ENSURE ENLIVE PO LIQD
237.0000 mL | Freq: Three times a day (TID) | ORAL | Status: DC
  Administered 2017-01-08 – 2017-01-09 (×2): 237 mL via ORAL

## 2017-01-08 NOTE — Progress Notes (Signed)
Initial Nutrition Assessment  DOCUMENTATION CODES:   Non-severe (moderate) malnutrition in context of chronic illness  INTERVENTION:    Ensure Enlive po TID, each supplement provides 350 kcal and 20 grams of protein  NUTRITION DIAGNOSIS:   Malnutrition (moderate) related to chronic illness (alcoholism ) as evidenced by moderate depletions of muscle mass, moderate depletion of body fat  GOAL:   Patient will meet greater than or equal to 90% of their needs  MONITOR:   PO intake, Supplement acceptance, Labs, Weight trends, I & O's  REASON FOR ASSESSMENT:   Consult Assessment of nutrition requirement/status  ASSESSMENT:   "46 year old male with history of valve codependence with history of withdrawal seizures, bipolar depression and thrombocytopenia associated: Is present in the seizures in the setting of alcohol withdrawal. His last drink was on the morning prior to admission. Reportedly he was in jail for alcohol intoxication and was paced on hold for 24 hours. After being released he had a witnessed seizure by a bystander. Patient has been and halfway house rehabilitation program several times in the past. He reports being homeless currently." Coped from Internal Medicine note, Eddie Northhungel, Nishant, MD, 01/08/17.  Pt reports "I don't eat food, I drink alcohol." He states if he does eat he will go to [employer] for a hot meal. Also reveals he's lost about 35 lbs unintentionally in the "last 40 days." Medications reviewed and include folvite, thiamine and MVI. Labs reviewed. CBG's 132-107.  Nutrition-Focused physical exam completed. Findings are moderate fat depletion, moderate muscle depletion, and no edema.   Diet Order:  Diet regular Room service appropriate? Yes; Fluid consistency: Thin  Skin:  Reviewed, no issues  Last BM:  7/15  Height:   Ht Readings from Last 1 Encounters:  01/07/17 6' (1.829 m)   Weight:   Wt Readings from Last 1 Encounters:  01/07/17 154 lb  4.8 oz (70 kg)   Ideal Body Weight:  81 kg  BMI:  Body mass index is 20.93 kg/m.  Estimated Nutritional Needs:   Kcal:  1800-2000  Protein:  90-100 gm  Fluid:  1.8-2.0 L  EDUCATION NEEDS:   No education needs identified at this time  Maureen ChattersKatie Jacqueline Spofford, RD, LDN Pager #: [phone] After-Hours Pager #: [phone]

## 2017-01-08 NOTE — Progress Notes (Signed)
PROGRESS NOTE                                                                                                                                                                                                             Patient Demographics:    Jordan Davidson, is a 46 y.o. male, DOB - [birth year], ZOX:[id number]  Admit date - 01/06/2017   Admitting Physician Jonah BlueJennifer Yates, MD  Outpatient Primary MD for the patient is Placey, Chales AbrahamsMary Ann, NP  LOS - 1  Outpatient Specialists:None  Chief Complaint  Patient presents with  . Seizures       Brief Narrative  46 year old male with history of valve codependence with history of withdrawal seizures, bipolar depression and thrombocytopenia associated: Is present in the seizures in the setting of alcohol withdrawal. His last drink was on the morning prior to admission. Reportedly he was in jail for alcohol intoxication and was paced on hold for 24 hours. After being released he had a witnessed seizure by a bystander. Patient has been and halfway house rehabilitation program several times in the past. He reports being homeless currently.  In the ED his CIWA was 13. He was afebrile. Blood work showed low platelets of 31, normal electrolytes and LFTs. Patient placed on observation for acute alcohol withdrawal and seizures.   Subjective:   Patient complains of left-sided shoulder pain radiating down to the arm. Reports symptoms frequently when he has alcohol withdrawal. No shortness of breath, nausea or vomiting.   Assessment  & Plan :    Principal Problem:   Alcohol withdrawal seizure (HCC) Monitor on CIWA. When necessary Ativan. CIWA  of 11 this morning, still tremulous. Continue thiamine, folate and multivitamin. He is interested in getting into a rehabilitation. Social work consulted. Seizure precautions.  Active Problems:  Chest pain Both typical and atypical symptoms. EKG unremarkable  except for prolonged QTC (492). Enzymes negative. 2-D echo with normal EF and no wall motion abnormality.     Alcohol use disorder, severe, dependence (HCC) Social work consulted.    Bipolar I disorder (HCC) Not on any medication. Was previously on Zyprexa and Neurontin. Will benefit from outpatient psychiatry referral.    Thrombocytopenia (HCC) Appears secondary to bone marrow suppression with alcohol use. Has had low platelets in the past. Monitor closely.  Elevated blood pressure   secondary to  alcohol withdrawal. Now improved.  Diarrhea No further symptoms. Discontinued enteric precaution.  Code Status : Full code  Family Communication  : None at bedside  Disposition: Possibly needs alcohol rehabilitation, should be stable for discharge tomorrow.  Barriers For Discharge : Active symptoms  Consults  :  None  Procedures  : None  DVT Prophylaxis  :  Lovenox -   Lab Results  Component Value Date   PLT 34 (L) 01/08/2017    Antibiotics  :   Anti-infectives    None        Objective:   Vitals:   01/07/17 1948 01/08/17 0200 01/08/17 0456 01/08/17 0559  BP: 135/87 128/84 130/85 135/78  Pulse: 68 68 70 88  Resp: 17  18   Temp: 99 F (37.2 C)  98.7 F (37.1 C)   TempSrc: Oral  Oral   SpO2: 98%  100%   Weight:      Height:        Wt Readings from Last 3 Encounters:  01/07/17 70 kg (154 lb 4.8 oz)  01/03/17 83.9 kg (185 lb)  12/13/16 83.9 kg (185 lb)     Intake/Output Summary (Last 24 hours) at 01/08/17 1150 Last data filed at 01/08/17 0934  Gross per 24 hour  Intake             2220 ml  Output             1950 ml  Net              270 ml     Physical Exam Gen.: Middle aged male not in distress   HEENT: Moist mucosa, supple neck Chest: Clear bilaterally CVS: Normal S1 and S2, no murmurs GI: Soft, nondistended, nontender Musculoskeletal: Warm, no edema CNS: Tremulous, oriented    Data Review:    CBC  Recent Labs Lab 01/06/17 2105  01/08/17 0442  WBC 6.8 5.2  HGB 13.8 13.9  HCT 39.0 40.1  PLT 31* 34*  MCV 89.7 89.9  MCH 31.7 31.2  MCHC 35.4 34.7  RDW 15.0 15.0  LYMPHSABS 0.5*  --   MONOABS 1.0  --   EOSABS 0.0  --   BASOSABS 0.0  --     Chemistries   Recent Labs Lab 01/06/17 2105 01/08/17 0442  NA 135 137  K 3.7 4.3  CL 97* 100*  CO2 30 28  GLUCOSE 132* 107*  BUN 7 11  CREATININE 0.67 0.73  CALCIUM 9.2 9.3   ------------------------------------------------------------------------------------------------------------------ No results for input(s): CHOL, HDL, LDLCALC, TRIG, CHOLHDL, LDLDIRECT in the last 72 hours.  Lab Results  Component Value Date   HGBA1C 5.7 (H) 02/15/2015   ------------------------------------------------------------------------------------------------------------------ No results for input(s): TSH, T4TOTAL, T3FREE, THYROIDAB in the last 72 hours.  Invalid input(s): FREET3 ------------------------------------------------------------------------------------------------------------------ No results for input(s): VITAMINB12, FOLATE, FERRITIN, TIBC, IRON, RETICCTPCT in the last 72 hours.  Coagulation profile No results for input(s): INR, PROTIME in the last 168 hours.  No results for input(s): DDIMER in the last 72 hours.  Cardiac Enzymes  Recent Labs Lab 01/07/17 0938 01/07/17 1547 01/07/17 2125  TROPONINI <0.03 <0.03 <0.03   ------------------------------------------------------------------------------------------------------------------ No results found for: BNP  Inpatient Medications  Scheduled Meds: . docusate sodium  100 mg Oral BID  . feeding supplement (ENSURE ENLIVE)  237 mL Oral BID BM  . folic acid  1 mg Oral Daily  . LORazepam  0-4 mg Intravenous Q6H   Followed by  . [START ON 01/09/2017] LORazepam  0-4  mg Intravenous Q12H  . multivitamin with minerals  1 tablet Oral Daily  . nicotine  14 mg Transdermal Daily  . thiamine  100 mg Oral Daily   Or    . thiamine  100 mg Intravenous Daily   Continuous Infusions: . sodium chloride 75 mL/hr (01/07/17 1642)   PRN Meds:.acetaminophen **OR** acetaminophen, LORazepam **OR** LORazepam, LORazepam, ondansetron **OR** ondansetron (ZOFRAN) IV  Micro Results No results found for this or any previous visit (from the past 240 hour(s)).  Radiology Reports No results found.  Time Spent in minutes  25   Eddie North M.D on 01/08/2017 at 11:50 AM  Between 7am to 7pm - Pager - [phone]  After 7pm go to [email or web address] - password [facility]  [facility] -  Office  [phone]

## 2017-01-08 NOTE — Care Management Note (Signed)
Case Management Note  Patient Details  Name: Jordan Davidson MRN: [id number] Date of Birth: 04-27-1971  Subjective/Objective:  46 y/o m admitted w/etoh w/drawal. Homeless. PCP-[facility]non compliance for several months,almost 1year-[facility] 1[facility] for meds.Will need bus pass, & shelter resources-CSW notified.                  Action/Plan:d/c homeless shelter.   Expected Discharge Date:                 Expected Discharge Plan:  Homeless Shelter  In-House Referral:  Clinical Social Work  Discharge planning Services  CM Consult  Post Acute Care Choice:    Choice offered to:     DME Arranged:    DME Agency:     HH Arranged:    HH Agency:     Status of Service:  In process, will continue to follow  If discussed at Long Length of Stay Meetings, dates discussed:    Additional Comments:  Lanier ClamMahabir, Jordan Fabry, RN 01/08/2017, 3:17 PM

## 2017-01-09 ENCOUNTER — Encounter (HOSPITAL_COMMUNITY): Payer: Self-pay | Admitting: Emergency Medicine

## 2017-01-09 ENCOUNTER — Emergency Department (HOSPITAL_COMMUNITY)
Admission: EM | Admit: 2017-01-09 | Discharge: 2017-01-10 | Disposition: A | Discharge status: Home / Self Care | Payer: Self-pay | Attending: Emergency Medicine | Admitting: Emergency Medicine

## 2017-01-09 DIAGNOSIS — Z716 Tobacco abuse counseling: Secondary | ICD-10-CM

## 2017-01-09 DIAGNOSIS — E440 Moderate protein-calorie malnutrition: Secondary | ICD-10-CM | POA: Diagnosis present

## 2017-01-09 DIAGNOSIS — F17210 Nicotine dependence, cigarettes, uncomplicated: Secondary | ICD-10-CM | POA: Insufficient documentation

## 2017-01-09 DIAGNOSIS — E876 Hypokalemia: Secondary | ICD-10-CM | POA: Insufficient documentation

## 2017-01-09 DIAGNOSIS — Y908 Blood alcohol level of 240 mg/100 ml or more: Secondary | ICD-10-CM | POA: Insufficient documentation

## 2017-01-09 DIAGNOSIS — F319 Bipolar disorder, unspecified: Secondary | ICD-10-CM

## 2017-01-09 DIAGNOSIS — F10229 Alcohol dependence with intoxication, unspecified: Secondary | ICD-10-CM | POA: Insufficient documentation

## 2017-01-09 DIAGNOSIS — F10920 Alcohol use, unspecified with intoxication, uncomplicated: Secondary | ICD-10-CM

## 2017-01-09 LAB — CBC
HCT: 40.3 % (ref 39.0–52.0)
HEMATOCRIT: 39.1 % (ref 39.0–52.0)
Hemoglobin: 14.1 g/dL (ref 13.0–17.0)
Hemoglobin: 13.8 g/dL (ref 13.0–17.0)
MCH: 31.6 pg (ref 26.0–34.0)
MCH: 31.4 pg (ref 26.0–34.0)
MCHC: 35 g/dL (ref 30.0–36.0)
MCHC: 35.3 g/dL (ref 30.0–36.0)
MCV: 90.4 fL (ref 78.0–100.0)
MCV: 89.1 fL (ref 78.0–100.0)
PLATELETS: 50 10*3/uL — AB (ref 150–400)
PLATELETS: 62 10*3/uL — AB (ref 150–400)
RBC: 4.46 MIL/uL (ref 4.22–5.81)
RBC: 4.39 MIL/uL (ref 4.22–5.81)
RDW: 15.1 % (ref 11.5–15.5)
RDW: 15.2 % (ref 11.5–15.5)
WBC: 6.4 10*3/uL (ref 4.0–10.5)
WBC: 8.1 10*3/uL (ref 4.0–10.5)

## 2017-01-09 LAB — BASIC METABOLIC PANEL
Anion gap: 14 (ref 5–15)
BUN: 16 mg/dL (ref 6–20)
CALCIUM: 9 mg/dL (ref 8.9–10.3)
CO2: 22 mmol/L (ref 22–32)
CREATININE: 0.71 mg/dL (ref 0.61–1.24)
Chloride: 98 mmol/L — ABNORMAL LOW (ref 101–111)
GLUCOSE: 81 mg/dL (ref 65–99)
Potassium: 3.3 mmol/L — ABNORMAL LOW (ref 3.5–5.1)
Sodium: 134 mmol/L — ABNORMAL LOW (ref 135–145)

## 2017-01-09 LAB — BLOOD GAS, VENOUS
ACID-BASE DEFICIT: 1.1 mmol/L (ref 0.0–2.0)
BICARBONATE: 21.6 mmol/L (ref 20.0–28.0)
O2 Saturation: 97.7 %
PH VEN: 7.448 — AB (ref 7.250–7.430)
PO2 VEN: 104 mmHg — AB (ref 32.0–45.0)
Patient temperature: 98.6
pCO2, Ven: 31.6 mmHg — ABNORMAL LOW (ref 44.0–60.0)

## 2017-01-09 LAB — ETHANOL: Alcohol, Ethyl (B): 253 mg/dL — ABNORMAL HIGH (ref <5)

## 2017-01-09 MED ORDER — NICOTINE 14 MG/24HR TD PT24: 14.0000 mg | MEDICATED_PATCH | Freq: Every day | TRANSDERMAL | 0 refills | Status: AC

## 2017-01-09 MED ORDER — POTASSIUM CHLORIDE CRYS ER 20 MEQ PO TBCR
40.0000 meq | EXTENDED_RELEASE_TABLET | Freq: Once | ORAL | Status: AC
  Administered 2017-01-10: 40 meq via ORAL
  Filled 2017-01-09: qty 2

## 2017-01-09 MED ORDER — LACTATED RINGERS IV BOLUS (SEPSIS)
1000.0000 mL | Freq: Once | INTRAVENOUS | Status: AC
  Administered 2017-01-09: 1000 mL via INTRAVENOUS

## 2017-01-09 MED ORDER — LORAZEPAM 1 MG PO TABS: 1.0000 mg | ORAL_TABLET | Freq: Three times a day (TID) | ORAL | 0 refills | Status: AC | PRN

## 2017-01-09 MED ORDER — ENSURE ENLIVE PO LIQD: 237.0000 mL | Freq: Three times a day (TID) | ORAL | 12 refills | Status: AC

## 2017-01-09 NOTE — Care Management Note (Signed)
Case Management Note  Patient Details  Name: Dennison Nancyodd Maurice Pokorny MRN: [id number] Date of Birth: August 20, 1970  Subjective/Objective:                    Action/Plan:d/c to homeless shelter.   Expected Discharge Date:  01/09/17               Expected Discharge Plan:  Homeless Shelter  In-House Referral:  Clinical Social Work  Discharge planning Services  CM Consult  Post Acute Care Choice:    Choice offered to:     DME Arranged:    DME Agency:     HH Arranged:    HH Agency:     Status of Service:  Completed, signed off  If discussed at [employer] Meetings, dates discussed:    Additional Comments:  Lanier ClamMahabir, Noah Pelaez, RN 01/09/2017, 12:00 PM

## 2017-01-09 NOTE — ED Provider Notes (Signed)
[facility]-EMERGENCY DEPT Provider Note   CSN: [id number] Arrival date & time: 01/09/17  1905     History   Chief Complaint Chief Complaint  Patient presents with  . Alcohol Intoxication    HPI Jordan Davidson is a 46 y.o. male.  HPI   46 yo M with h/o bipolar disorder, chronic alcoholism here with acute alcohol intoxication. Pt was just admitted and discharged following possible alcohol w/d seizure, sent home on ativan. He reportedly was found in a [employer] parking lot, surrounded by alcohol and a listerine bottle. On my assessment, he admits to drinking heavily. Declines any other complaints. States he is here because he "wants to sleep."  Level 5 caveat invoked as remainder of history, ROS, and physical exam limited due to patient's intoxication.    Past Medical History:  Diagnosis Date  . Alcoholism (HCC)   . Bipolar 1 disorder, depressed (HCC)   . Depression   . Thrombocytopenia [facility](HCC)     Patient Active Problem List   Diagnosis Date Noted  . Malnutrition of moderate degree 01/09/2017  . Tobacco abuse counseling 01/09/2017  . Hyperglycemia 01/06/2017  . Alcohol withdrawal seizure (HCC) 01/06/2017  . Hypomagnesemia 10/14/2016  . Alcohol withdrawal (HCC) 10/13/2016  . Scalp laceration 05/01/2016  . Acute blood loss anemia 05/01/2016  . Thrombocytopenia (HCC) 05/01/2016  . Blunt head trauma 04/28/2016  . Major depressive disorder, recurrent episode, moderate (HCC) 12/13/2015  . 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 Bipolar I disorder (HCC) 02/14/2015  . Alcohol use disorder, severe, dependence (HCC) 02/13/2015    Past Surgical History:  Procedure Laterality Date  . NO PAST SURGERIES         Home Medications    Prior to Admission medications   Medication Sig Start Date End Date Taking? Authorizing Provider  feeding supplement, ENSURE ENLIVE, (ENSURE ENLIVE) LIQD Take 237 mLs by mouth 3 (three) times daily between meals. 01/09/17   Dhungel, Nishant,  MD  LORazepam (ATIVAN) 1 MG tablet Take 1 tablet (1 mg total) by mouth every 8 (eight) hours as needed for anxiety (tremors, alcohol withdrawal). 01/09/17   Dhungel, Nishant, MD  nicotine (NICODERM CQ - DOSED IN MG/24 HOURS) 14 mg/24hr patch Place 1 patch (14 mg total) onto the skin daily. 01/10/17   Dhungel, Theda BelfastNishant, MD    Family History Family History  Problem Relation Age of Onset  . Hypertension Mother   . Cancer Father   . Heart failure Father   . Alcoholism Father     Social History Social History  Substance Use Topics  . Smoking status: Current Every Day Smoker    Packs/day: 1.00    Years: 38.00    Types: Cigarettes  . Smokeless tobacco: Never Used  . Alcohol use Yes     Comment: daily - "a lot... as much as I can until I pass out" - 1/5 plus daily     Allergies   Patient has no known allergies.   Review of Systems Review of Systems  Unable to perform ROS: Mental status change     Physical Exam Updated Vital Signs BP 114/65 (BP Location: Right Arm)   Pulse (!) 121   Temp 98.4 F (36.9 C) (Oral)   Resp (!) 21   SpO2 92%   Physical Exam  Constitutional: He appears well-developed and well-nourished. No distress.  Disheveled, smells of alcohol  HENT:  Head: Normocephalic and atraumatic.  No apparent tongue wounds/lacerations/contusions  Eyes: Conjunctivae are normal.  Neck: Neck supple.  Cardiovascular: Normal rate, regular rhythm and normal heart sounds.  Exam reveals no friction rub.   No murmur heard. Pulmonary/Chest: Effort normal and breath sounds normal. No respiratory distress. He has no wheezes. He has no rales.  Abdominal: He exhibits no distension.  Musculoskeletal: He exhibits no edema.  Neurological: He is alert. He exhibits normal muscle tone.  Gait ataxic. Normal strength b/l UE and LE. Face symmetric. Speech is mildly slurred.  Skin: Skin is warm. Capillary refill takes less than 2 seconds.  Psychiatric: He has a normal mood and affect.    Nursing note and vitals reviewed.    ED Treatments / Results  Labs (all labs ordered are listed, but only abnormal results are displayed) Labs Reviewed  CBC - Abnormal; Notable for the following:       Result Value   Platelets 62 (*)    All other components within normal limits  BASIC METABOLIC PANEL - Abnormal; Notable for the following:    Sodium 134 (*)    Potassium 3.3 (*)    Chloride 98 (*)    All other components within normal limits  ETHANOL - Abnormal; Notable for the following:    Alcohol, Ethyl (B) 253 (*)    All other components within normal limits  BLOOD GAS, VENOUS - Abnormal; Notable for the following:    pH, Ven 7.448 (*)    pCO2, Ven 31.6 (*)    pO2, Ven 104.0 (*)    All other components within normal limits    EKG  EKG Interpretation None       Radiology No results found.  Procedures Procedures (including critical care time)  Medications Ordered in ED Medications  potassium chloride SA (K-DUR,KLOR-CON) CR tablet 40 mEq (not administered)  lactated ringers bolus 1,000 mL (0 mLs Intravenous Stopped 01/09/17 2138)     Initial Impression / Assessment and Plan / ED Course  I have reviewed the triage vital signs and the nursing notes.  Pertinent labs & imaging results that were available during my care of the patient were reviewed by me and considered in my medical decision making (see chart for details).     46 yo F with PMHx EtOH dependence, recent admission for w/d (d/c'ed on ativan) here with alcohol intoxication. Lab works c/w EtOH use, mild hypokalemia which is likely 2/2 poor nutrition. He is chronically thrombocytopenic, without acute change and I suspect this is 2/2 his underlying alcoholic liver disease. No apparent head trauma. No signs of seizure. Will monitor in ED for clinical sobriety, replace K, and re-assess.   Patient care transferred to Dr. Fayrene Fearing at the end of my shift. Patient presentation, ED course, and plan of care discussed  with review of all pertinent labs and imaging. Please see his/her note for further details regarding further ED course and disposition.   Final Clinical Impressions(s) / ED Diagnoses   Final diagnoses:  Alcoholic intoxication without complication (HCC)  Hypokalemia    New Prescriptions New Prescriptions   No medications on file     Shaune Pollack, MD 01/10/17 [phone]

## 2017-01-09 NOTE — ED Triage Notes (Addendum)
Per EMS pt discharged this morning and was found drunk in [employer] parking lot. Pt complaining of abdominal pain and admits to drinking bottle of Listerine. A&O x 4.   122/84 HR 114 Resp 14 CBG 96

## 2017-01-09 NOTE — Discharge Instructions (Signed)
Finding Treatment for Addiction What is addiction? Addiction is a complex disease of the brain. It causes an uncontrollable (compulsive) need for a substance. You can be addicted to alcohol, illegal drugs, or prescription medicines such as painkillers. Addiction can also be a behavior, like gambling or shopping. The need for the drug or activity can become so strong that you think about it all the time. You can also become physically dependent on a substance. Addiction can change the way your brain works. Because of these changes, getting more of whatever you are addicted to becomes the most important thing to you and feels better than other activities or relationships. Addiction can lead to changes in health, behavior, emotions, relationships, and choices that affect you and everyone around you. How do I know if I need treatment for addiction? Addiction is a progressive disease. Without treatment, addiction can get worse. Living with addiction puts you at higher risk for injury, poor health, lost employment, loss of money, and even death. You might need treatment for addiction if:  You have tried to stop or cut down, but you cannot.  Your addiction is causing physical health problems.  You find it annoying that your friends and family are concerned about your alcohol or substance use.  You feel guilty about substance abuse or a compulsive behavior.  You have lied or tried to hide your addiction.  You need a particular substance or activity to start your day or to calm down.  You are getting in trouble at school, work, home, or with the police.  You have done something illegal to support your addiction.  You are running out of money because of your addiction.  You have no time for anything other than your addiction.  What types of treatment are available? The treatment program that is right for you will depend on many factors, including the type of addiction you have. Treatment programs  can be outpatient or inpatient. In an outpatient program, you live at home and go to work or school, but you also go to a clinic for treatment. With an inpatient program, you live and sleep at the program facility during treatment. After treatment, you might need a plan for support during recovery. Other treatment options include:  Medicine. ? Some addictions may be treated with prescription medicines. ? You might also need medicine to treat anxiety or depression.  Counseling and behavior therapy. Therapy can help individuals and families behave in healthier ways and relate more effectively.  Support groups. Confidential group therapy, such as a 12-step program, can help individuals and families during treatment and recovery.  No single type of program is right for everyone. Many treatment programs involve a combination of education, counseling, and a 12-step, spiritually-based approach. Some treatment programs are government sponsored. They are geared for patients who do not have private insurance. Treatment programs can vary in many respects, such as:  Cost and types of insurance that are accepted.  Types of on-site medical services that are offered.  Length of stay, setting, and size.  Overall philosophy of treatment.  What should I consider when selecting a treatment program? It is important to think about your individual requirements when selecting a treatment program. There are a number of things to consider, such as:  If the program is certified by the appropriate government agency. Even private programs must be certified and employ certified professionals.  If the program is covered by your insurance. If finances are a concern, the first call you   should make is to your insurance company, if you have health insurance. Ask for a list of treatment programs that are in your network, and confirm any copayments and deductibles that you may have to pay. ? If you do not have insurance, or  if you choose to attend a program that does not accept your insurance, discuss whether a payment plan can be set up.  If treatment is available in languages other than English, if needed.  If the program offers detoxification treatment, if needed.  If 12-step meetings are held at the center or if transport is available for patients to attend meetings at other locations.  If the program is professional, organized, and clean.  If the program meets all of your needs, including physical and cultural needs.  If the facility offers specific treatment for your particular addiction.  If support continues to be offered after you have left the program.  If your treatment plan is continually looked at to make sure you are receiving the right treatment at the right time.  If mental health counseling is part of your treatment.  If medicine is included in treatment, if needed.  If your family is included in your treatment plan and if support is offered to them throughout the treatment process.  How the treatment works to prevent relapse.  Where else can I get help?  Your health care provider. Ask him or her to help you find addiction treatment. These discussions are confidential.  The National Council on Alcoholism and Drug Dependence (NCADD). This group has information about treatment centers and programs for people who have an addiction and for family members. ? The telephone number is 1-800-NCA-CALL (1-800-622-2255). ? The website is https://ncadd.org/about-ncadd/our-affiliates  The Substance Abuse and Mental Health Services Administration (SAMHSA). This group will help you find publicly funded treatment centers, help hotlines, and counseling services near you. ? The telephone number is 1-800-662-HELP (1-800-662-4357). ? The website is www.findtreatment.samhsa.gov In countries outside of the [place] and [place], look in local directories for contact information for services in your area. This  information is not intended to replace advice given to you by your health care provider. Make sure you discuss any questions you have with your health care provider. Document Released: 05/11/2005 Document Revised: 05/08/2016 Document Reviewed: 03/31/2014 Elsevier Interactive Patient Education  2017 [employer]  

## 2017-01-09 NOTE — ED Notes (Signed)
Bed: [facility] Expected date:  Expected time:  Means of arrival:  Comments: EMS/ETOH 

## 2017-01-09 NOTE — Discharge Summary (Signed)
Physician Discharge Summary  Jordan Davidson UJW:[id number] DOB: [birth year] DOA: 01/06/2017  PCP: Lavinia Sharps, NP  Admit date: 01/06/2017 Discharge date: 01/09/2017  Admitted From: Self Disposition:  Self  Recommendations for Outpatient Follow-up:  Follow at [facility] to establish care. (Information provided by case manager)  Home Health: None Equipment/Devices: None  Discharge Condition: Fair CODE STATUS: Full code Diet recommendation: Regular with supplement    Discharge Diagnoses:  Principal Problem:   Alcohol withdrawal seizure (HCC)   Active Problems:   Alcohol use disorder, severe, dependence (HCC)   Bipolar I disorder (HCC)   Major depressive disorder, recurrent episode, moderate (HCC)   Thrombocytopenia (HCC)   Alcohol withdrawal (HCC)   Hyperglycemia   Malnutrition of moderate degree   Tobacco abuse counseling   Brief narrative/history of present illness 46 year old male with history of valve codependence with history of withdrawal seizures, bipolar depression and thrombocytopenia associated: Is present in the seizures in the setting of alcohol withdrawal. His last drink was on the morning prior to admission. Reportedly he was in jail for alcohol intoxication and was paced on hold for 24 hours. After being released he had a witnessed seizure by a bystander. Patient has been and halfway house rehabilitation program several times in the past. He reports being homeless currently.  In the ED his CIWA was 13. He was afebrile. Blood work showed low platelets of 31, normal electrolytes and LFTs. Patient placed on observation for acute alcohol withdrawal and seizures.  Hospital course  Principal Problem:   Alcohol withdrawal seizure(HCC) Monitor on CIWA. When necessary Ativan. CIWA  of 8 this morning, feels better and does not have further tremors. Received thiamine, folate and multivitamin . Was treated with as needed Ativan. No further seizure  activity.  counseled strongly on cessation and he lanced to quit. Will discharge him on Ativan 1 mg every 8 hours as needed for tremors, withdrawal symptoms (total 8 tablets)    Active Problems:  Chest pain Both typical and atypical symptoms. EKG unremarkable except for prolonged QTC (492). Enzymes negative. 2-D echo with normal EF and no wall motion abnormality. No further symptoms.     Alcohol use disorder, severe, dependence (HCC) Social work consulted and provided outpatient resource..    Bipolar I disorder (HCC) Not on any medication. Was previously on Zyprexa and Neurontin. Will benefit from outpatient psychiatry referral.    Thrombocytopenia (HCC) Appears secondary to bone marrow suppression with alcohol use. Has had low platelets in the past.  Elevated blood pressure   secondary to alcohol withdrawal. Now improved.  Diarrhea No further symptoms. Discontinued enteric precaution.  Tobacco abuse Counseled strongly on cessation. Nicotine patch prescribed.  Family Communication  : None at bedside  Disposition: Patient offered outpatient shelter but preferred going to alcohol detox house before.  Consults  :  None  Procedures  : None  Discharge Instructions   Allergies as of 01/09/2017   No Known Allergies     Medication List    TAKE these medications   feeding supplement (ENSURE ENLIVE) Liqd Take 237 mLs by mouth 3 (three) times daily between meals.   LORazepam 1 MG tablet Commonly known as:  ATIVAN Take 1 tablet (1 mg total) by mouth every 8 (eight) hours as needed for anxiety (tremors, alcohol withdrawal).   nicotine 14 mg/24hr patch Commonly known as:  NICODERM CQ - dosed in mg/24 hours Place 1 patch (14 mg total) onto the skin daily.      Follow-up Information  [facility] Follow up in 1 day(s).          No Known Allergies       Procedures/Studies:  No results found.   Subjective: Is better overall.  CIWA of 8 this morning. Denies visual or auditory hallucinations.  Discharge Exam: Vitals:   01/08/17 2149 01/09/17 0555  BP: (!) 144/85 (!) 131/98  Pulse: 85 77  Resp: 18 16  Temp: 97.6 F (36.4 C) 97.8 F (36.6 C)   Vitals:   01/08/17 0559 01/08/17 1215 01/08/17 2149 01/09/17 0555  BP: 135/78 (!) 139/92 (!) 144/85 (!) 131/98  Pulse: 88 76 85 77  Resp:  18 18 16   Temp:  98.1 F (36.7 C) 97.6 F (36.4 C) 97.8 F (36.6 C)  TempSrc:  Oral Oral Oral  SpO2:  99% 97% 100%  Weight:      Height:        Gen.:  not in distress   HEENT: Moist mucosa, supple neck Chest: Clear bilaterally CVS: Normal S1 and S2, no murmurs GI: Soft, nondistended, nontender Musculoskeletal: Warm, no edema CNS: Alert and oriented, no tremors    The results of significant diagnostics from this hospitalization (including imaging, microbiology, ancillary and laboratory) are listed below for reference.     Microbiology: No results found for this or any previous visit (from the past 240 hour(s)).   Labs: BNP (last 3 results) No results for input(s): BNP in the last 8760 hours. Basic Metabolic Panel:  Recent Labs Lab 01/06/17 2105 01/08/17 0442  NA 135 137  K 3.7 4.3  CL 97* 100*  CO2 30 28  GLUCOSE 132* 107*  BUN 7 11  CREATININE 0.67 0.73  CALCIUM 9.2 9.3   Liver Function Tests: No results for input(s): AST, ALT, ALKPHOS, BILITOT, PROT, ALBUMIN in the last 168 hours. No results for input(s): LIPASE, AMYLASE in the last 168 hours. No results for input(s): AMMONIA in the last 168 hours. CBC:  Recent Labs Lab 01/06/17 2105 01/08/17 0442 01/09/17 0507  WBC 6.8 5.2 6.4  NEUTROABS 5.3  --   --   HGB 13.8 13.9 14.1  HCT 39.0 40.1 40.3  MCV 89.7 89.9 90.4  PLT 31* 34* 50*   Cardiac Enzymes:  Recent Labs Lab 01/07/17 0938 01/07/17 1547 01/07/17 2125  TROPONINI <0.03 <0.03 <0.03   BNP: Invalid input(s): POCBNP CBG: No results for input(s): GLUCAP in the last 168  hours. D-Dimer No results for input(s): DDIMER in the last 72 hours. Hgb A1c No results for input(s): HGBA1C in the last 72 hours. Lipid Profile No results for input(s): CHOL, HDL, LDLCALC, TRIG, CHOLHDL, LDLDIRECT in the last 72 hours. Thyroid function studies No results for input(s): TSH, T4TOTAL, T3FREE, THYROIDAB in the last 72 hours.  Invalid input(s): FREET3 Anemia work up No results for input(s): VITAMINB12, FOLATE, FERRITIN, TIBC, IRON, RETICCTPCT in the last 72 hours. Urinalysis    Component Value Date/Time   COLORURINE AMBER (A) 10/14/2016 0158   APPEARANCEUR HAZY (A) 10/14/2016 0158   APPEARANCEUR Clear 10/23/2014 1607   LABSPEC 1.025 10/14/2016 0158   LABSPEC 1.002 10/23/2014 1607   PHURINE 7.0 10/14/2016 0158   GLUCOSEU NEGATIVE 10/14/2016 0158   GLUCOSEU Negative 10/23/2014 1607   HGBUR SMALL (A) 10/14/2016 0158   BILIRUBINUR NEGATIVE 10/14/2016 0158   BILIRUBINUR Negative 10/23/2014 1607   KETONESUR 20 (A) 10/14/2016 0158   PROTEINUR 100 (A) 10/14/2016 0158   UROBILINOGEN 1.0 09/13/2014 0827   NITRITE NEGATIVE 10/14/2016 0158  LEUKOCYTESUR NEGATIVE 10/14/2016 0158   LEUKOCYTESUR Negative 10/23/2014 1607   Sepsis Labs Invalid input(s): PROCALCITONIN,  WBC,  LACTICIDVEN Microbiology No results found for this or any previous visit (from the past 240 hour(s)).   Time coordinating discharge: Over 30 minutes  SIGNED:   Eddie North, MD  [facility] 01/09/2017, 11:01 AM Pager   If 7PM-7AM, please contact night-coverage [email or web address] Password [email or web address]

## 2017-01-09 NOTE — Clinical Social Work Note (Signed)
Clinical Social Work Assessment  Patient Details  Name: Jordan Davidson MRN: [id number] Date of Birth: 08-09-1970  Date of referral:  01/09/17               Reason for consult:  Housing Concerns/Homelessness, Substance Use/ETOH Abuse                Permission sought to share information with:    Permission granted to share information::     Name::        Agency::     Relationship::     Contact Information:     Housing/Transportation Living arrangements for the past 2 months:  Homeless Source of Information:  Patient Patient Interpreter Needed:  None Criminal Activity/Legal Involvement Pertinent to Current Situation/Hospitalization:  No - Comment as needed Significant Relationships:  Other Family Members Lives with:  Self Do you feel safe going back to the place where you live?   (Pt homeless) Need for family participation in patient care:  No (Coment)  Care giving concerns:  Patient homeless and etoh abuse.   Social Worker assessment / plan:  CSW spoke with patient at bedside regarding homelessness and etoh abuse. Patient reported that he has been staying in an abandoned tractor trailer for the past 6 months and prior to that he was in a halfway house. Patient reported that he is still in contact with the owner of the halfway house and there is a possibility he can return. CSW encouraged patient to reach out to owner of halfway house to inquire about potential housing, patient reported that he would. CSW and patient discussed his etoh use and sobriety. Patient reported that he has been to multiple residential facilities in the past and is not interested in making an appointment at [facility]. CSW provided patient with outpatient and residential substance abuse resources. CSW also provided patient with housing resources. Patient reported that he does not want to go to a shelter due to the risk of compromising his sobriety due to the environment. CSW validated patient's concerns  and again recommended that he contact halfway house owner.  Employment status:  Unemployed Health and safety inspectornsurance information:  Self Pay (Medicaid Pending) PT Recommendations:  Not assessed at this time Information / Referral to community resources:  Shelter, Outpatient Substance Abuse Treatment Options, Residential Substance Abuse Treatment Options  Patient/Family's Response to care:  Patient reported that he would follow up with halfway house owner about returning there and starting the program again.   Patient/Family's Understanding of and Emotional Response to Diagnosis, Current Treatment, and Prognosis:  Patient presented hopeful about potentially returning to halfway house and continuing his sobriety. CSW encouraged patient to follow up halfway house owner and positively affirmed his sobriety.   Emotional Assessment Appearance:  Appears stated age Attitude/Demeanor/Rapport:  Other (Open) Affect (typically observed):  Hopeful Orientation:  Oriented to Self, Oriented to Place, Oriented to  Time, Oriented to Situation Alcohol / Substance use:  Alcohol Use Psych involvement (Current and /or in the community):  No (Comment)  Discharge Needs  Concerns to be addressed:  No discharge needs identified Readmission within the last 30 days:  No Current discharge risk:  None Barriers to Discharge:  No Barriers Identified   Antionette PolesKimberly L Kayra Crowell, LCSW 01/09/2017, 9:02 AM

## 2017-01-10 ENCOUNTER — Emergency Department (HOSPITAL_COMMUNITY)
Admission: EM | Admit: 2017-01-10 | Discharge: 2017-01-11 | Disposition: A | Discharge status: Home / Self Care | Payer: Self-pay | Attending: Emergency Medicine | Admitting: Emergency Medicine

## 2017-01-10 DIAGNOSIS — F1014 Alcohol abuse with alcohol-induced mood disorder: Secondary | ICD-10-CM | POA: Insufficient documentation

## 2017-01-10 DIAGNOSIS — F329 Major depressive disorder, single episode, unspecified: Secondary | ICD-10-CM | POA: Insufficient documentation

## 2017-01-10 DIAGNOSIS — R45851 Suicidal ideations: Secondary | ICD-10-CM | POA: Insufficient documentation

## 2017-01-10 DIAGNOSIS — Z79899 Other long term (current) drug therapy: Secondary | ICD-10-CM | POA: Insufficient documentation

## 2017-01-10 DIAGNOSIS — Z5900 Homelessness unspecified: Secondary | ICD-10-CM

## 2017-01-10 DIAGNOSIS — F10929 Alcohol use, unspecified with intoxication, unspecified: Secondary | ICD-10-CM

## 2017-01-10 DIAGNOSIS — F32A Depression, unspecified: Secondary | ICD-10-CM

## 2017-01-10 DIAGNOSIS — F17210 Nicotine dependence, cigarettes, uncomplicated: Secondary | ICD-10-CM | POA: Insufficient documentation

## 2017-01-10 LAB — CBC
HEMATOCRIT: 37.5 % — AB (ref 39.0–52.0)
Hemoglobin: 13.4 g/dL (ref 13.0–17.0)
MCH: 32.1 pg (ref 26.0–34.0)
MCHC: 35.7 g/dL (ref 30.0–36.0)
MCV: 89.9 fL (ref 78.0–100.0)
PLATELETS: 86 10*3/uL — AB (ref 150–400)
RBC: 4.17 MIL/uL — AB (ref 4.22–5.81)
RDW: 15.7 % — AB (ref 11.5–15.5)
WBC: 7.5 10*3/uL (ref 4.0–10.5)

## 2017-01-10 LAB — COMPREHENSIVE METABOLIC PANEL
ALT: 53 U/L (ref 17–63)
ANION GAP: 11 (ref 5–15)
AST: 80 U/L — ABNORMAL HIGH (ref 15–41)
Albumin: 4.1 g/dL (ref 3.5–5.0)
Alkaline Phosphatase: 85 U/L (ref 38–126)
BUN: 9 mg/dL (ref 6–20)
CALCIUM: 8.6 mg/dL — AB (ref 8.9–10.3)
CHLORIDE: 101 mmol/L (ref 101–111)
CO2: 23 mmol/L (ref 22–32)
CREATININE: 0.62 mg/dL (ref 0.61–1.24)
Glucose, Bld: 103 mg/dL — ABNORMAL HIGH (ref 65–99)
Potassium: 3.8 mmol/L (ref 3.5–5.1)
SODIUM: 135 mmol/L (ref 135–145)
Total Bilirubin: 0.4 mg/dL (ref 0.3–1.2)
Total Protein: 7.1 g/dL (ref 6.5–8.1)

## 2017-01-10 LAB — RAPID URINE DRUG SCREEN, HOSP PERFORMED
AMPHETAMINES: NOT DETECTED
BENZODIAZEPINES: POSITIVE — AB
Barbiturates: NOT DETECTED
COCAINE: NOT DETECTED
Opiates: NOT DETECTED
Tetrahydrocannabinol: NOT DETECTED

## 2017-01-10 LAB — ETHANOL: ALCOHOL ETHYL (B): 324 mg/dL — AB (ref <5)

## 2017-01-10 MED ORDER — LORAZEPAM 1 MG PO TABS: 0.0000 mg | ORAL_TABLET | Freq: Two times a day (BID) | ORAL | Status: DC

## 2017-01-10 MED ORDER — VITAMIN B-1 100 MG PO TABS
100.0000 mg | ORAL_TABLET | Freq: Every day | ORAL | Status: DC
  Administered 2017-01-11: 100 mg via ORAL
  Filled 2017-01-10: qty 1

## 2017-01-10 MED ORDER — ACETAMINOPHEN 325 MG PO TABS: 650.0000 mg | ORAL_TABLET | ORAL | Status: DC | PRN

## 2017-01-10 MED ORDER — ZOLPIDEM TARTRATE 5 MG PO TABS: 5.0000 mg | ORAL_TABLET | Freq: Every evening | ORAL | Status: DC | PRN

## 2017-01-10 MED ORDER — ONDANSETRON HCL 4 MG PO TABS: 4.0000 mg | ORAL_TABLET | Freq: Three times a day (TID) | ORAL | Status: DC | PRN

## 2017-01-10 MED ORDER — LORAZEPAM 1 MG PO TABS
0.0000 mg | ORAL_TABLET | Freq: Four times a day (QID) | ORAL | Status: DC
Start: 2017-01-10 — End: 2017-01-11

## 2017-01-10 MED ORDER — ALUM & MAG HYDROXIDE-SIMETH 200-200-20 MG/5ML PO SUSP: 30.0000 mL | Freq: Four times a day (QID) | ORAL | Status: DC | PRN

## 2017-01-10 MED ORDER — LORAZEPAM 2 MG/ML IJ SOLN
0.0000 mg | Freq: Four times a day (QID) | INTRAMUSCULAR | Status: DC
Start: 2017-01-10 — End: 2017-01-11

## 2017-01-10 MED ORDER — NICOTINE 21 MG/24HR TD PT24: 21.0000 mg | MEDICATED_PATCH | Freq: Every day | TRANSDERMAL | Status: DC

## 2017-01-10 MED ORDER — THIAMINE HCL 100 MG/ML IJ SOLN: 100.0000 mg | Freq: Every day | INTRAMUSCULAR | Status: DC

## 2017-01-10 MED ORDER — LORAZEPAM 2 MG/ML IJ SOLN: 0.0000 mg | Freq: Two times a day (BID) | INTRAMUSCULAR | Status: DC

## 2017-01-10 NOTE — ED Triage Notes (Signed)
Patient found by [employer] behind some buildings drinking Listerine.  Some of his friends said they took a knife from him and that he had threatened suicide.  Alert but appears extremely intoxicated, not able to walk except with assistance.

## 2017-01-10 NOTE — ED Provider Notes (Signed)
[facility]-EMERGENCY DEPT Provider Note   CSN: [id number] Arrival date & time: 01/10/17  1527     History   Chief Complaint Chief Complaint  [id number] presents with  . Alcohol Intoxication    HPI Jordan Davidson is a 46 y.o. male.  Patient is here for evaluation of suicidal ideation, and chronic alcoholism.  He was here yesterday, and evaluated for alcohol ingestion.  He states that he has been off his psychiatric medications for several months and he is sad, homeless, and contemplating suicide.  He does not have an active suicidal plan.  He denies recent illnesses including fever, chills, cough, chest pain, weakness or dizziness.  He came here by EMS.  There are no other known modifying factors.   HPI  Past Medical History:  Diagnosis Date  . Alcoholism (HCC)   . Bipolar 1 disorder, depressed (HCC)   . Depression   . Thrombocytopenia [facility](HCC)     Patient Active Problem List   Diagnosis Date Noted  . Malnutrition of moderate degree 01/09/2017  . Tobacco abuse counseling 01/09/2017  . Hyperglycemia 01/06/2017  . Alcohol withdrawal seizure (HCC) 01/06/2017  . Hypomagnesemia 10/14/2016  . Alcohol withdrawal (HCC) 10/13/2016  . Scalp laceration 05/01/2016  . Acute blood loss anemia 05/01/2016  . Thrombocytopenia (HCC) 05/01/2016  . Blunt head trauma 04/28/2016  . Major depressive disorder, recurrent episode, moderate (HCC) 12/13/2015  . 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 Bipolar I disorder (HCC) 02/14/2015  . Alcohol use disorder, severe, dependence (HCC) 02/13/2015    Past Surgical History:  Procedure Laterality Date  . NO PAST SURGERIES         Home Medications    Prior to Admission medications   Medication Sig Start Date End Date Taking? Authorizing Provider  OLANZapine (ZYPREXA) 10 MG tablet Take 10 mg by mouth at bedtime.    Yes [provider]  feeding supplement, ENSURE ENLIVE, (ENSURE ENLIVE) LIQD Take 237 mLs by mouth 3 (three) times  daily between meals. Patient not taking: Reported on 01/10/2017 01/09/17   Dhungel, Theda BelfastNishant, MD  LORazepam (ATIVAN) 1 MG tablet Take 1 tablet (1 mg total) by mouth every 8 (eight) hours as needed for anxiety (tremors, alcohol withdrawal). Patient not taking: Reported on 01/10/2017 01/09/17   Dhungel, Theda BelfastNishant, MD  nicotine (NICODERM CQ - DOSED IN MG/24 HOURS) 14 mg/24hr patch Place 1 patch (14 mg total) onto the skin daily. Patient not taking: Reported on 01/10/2017 01/10/17   Eddie Northhungel, Nishant, MD    Family History Family History  Problem Relation Age of Onset  . Hypertension Mother   . Cancer Father   . Heart failure Father   . Alcoholism Father     Social History Social History  Substance Use Topics  . Smoking status: Current Every Day Smoker    Packs/day: 1.00    Years: 38.00    Types: Cigarettes  . Smokeless tobacco: Never Used  . Alcohol use Yes     Comment: daily - "a lot... as much as I can until I pass out" - 1/5 plus daily     Allergies   Patient has no known allergies.   Review of Systems Review of Systems  All other systems reviewed and are negative.    Physical Exam Updated Vital Signs BP (!) 92/55 (BP Location: Left Arm)   Pulse 99   Temp 98.2 F (36.8 C) (Oral)   Resp 16   SpO2 95%   Physical Exam  Constitutional: He is oriented  to person, place, and time. He appears well-developed and well-nourished.  HENT:  Head: Normocephalic and atraumatic.  Right Ear: External ear normal.  Left Ear: External ear normal.  Eyes: Pupils are equal, round, and reactive to light. Conjunctivae and EOM are normal.  Neck: Normal range of motion and phonation normal. Neck supple.  Cardiovascular: Normal rate, regular rhythm and normal heart sounds.   Pulmonary/Chest: Effort normal and breath sounds normal. He exhibits no bony tenderness.  Abdominal:  He is eating and tolerating it well.  Musculoskeletal: Normal range of motion.  Neurological: He is alert and oriented to  person, place, and time. No cranial nerve deficit or sensory deficit. He exhibits normal muscle tone. Coordination normal.  Mild dysarthria.  No aphasia.  Skin: Skin is warm, dry and intact.  Psychiatric: He has a normal mood and affect. His behavior is normal. Judgment and thought content normal.  Nursing note and vitals reviewed.    ED Treatments / Results  Labs (all labs ordered are listed, but only abnormal results are displayed) Labs Reviewed  COMPREHENSIVE METABOLIC PANEL - Abnormal; Notable for the following:       Result Value   Glucose, Bld 103 (*)    Calcium 8.6 (*)    AST 80 (*)    All other components within normal limits  ETHANOL - Abnormal; Notable for the following:    Alcohol, Ethyl (B) 324 (*)    All other components within normal limits  CBC - Abnormal; Notable for the following:    RBC 4.17 (*)    HCT 37.5 (*)    RDW 15.7 (*)    Platelets 86 (*)    All other components within normal limits  RAPID URINE DRUG SCREEN, HOSP PERFORMED - Abnormal; Notable for the following:    Benzodiazepines POSITIVE (*)    All other components within normal limits    EKG  EKG Interpretation None       Radiology No results found.  Procedures Procedures (including critical care time)  Medications Ordered in ED Medications - No data to display   Initial Impression / Assessment and Plan / ED Course  I have reviewed the triage vital signs and the nursing notes.  Pertinent labs & imaging results that were available during my care of the patient were reviewed by me and considered in my medical decision making (see chart for details).      Patient Vitals for the past 24 hrs:  BP Temp Temp src Pulse Resp SpO2  01/10/17 1817 (!) 92/55 98.2 F (36.8 C) Oral 99 16 95 %  01/10/17 1541 124/79 98.9 F (37.2 C) Oral (!) 113 16 98 %    TTS consult   Final Clinical Impressions(s) / ED Diagnoses   Final diagnoses:  Suicidal ideation  Alcoholic intoxication with  complication (HCC)  Homeless  Depression, unspecified depression type    Homelessness with depression, and alcoholism.  Nursing Notes Reviewed/ Care Coordinated Applicable Imaging Reviewed Interpretation of Laboratory Data incorporated into ED treatment  Plan-as per TTS in conjunction with oncoming provider team  New Prescriptions New Prescriptions   No medications on file     Mancel Bale, MD 01/11/17 1728

## 2017-01-10 NOTE — ED Notes (Signed)
Pt intoxicated, not forthcoming with information, demanding that the staff leave him alone he just wants to sleep.  Awake, alert & responsive, no distress noted, calm at present.  Monitoring for safety, Q 15 min checks in effect.  Safety check for contraband completed, no items found.

## 2017-01-10 NOTE — ED Notes (Signed)
Pt stated "I'm thinking about leaving.  Can you just let me sleep a minute?"
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
## 2017-01-11 DIAGNOSIS — F17210 Nicotine dependence, cigarettes, uncomplicated: Secondary | ICD-10-CM

## 2017-01-11 DIAGNOSIS — R739 Hyperglycemia, unspecified: Secondary | ICD-10-CM

## 2017-01-11 DIAGNOSIS — R569 Unspecified convulsions: Secondary | ICD-10-CM

## 2017-01-11 DIAGNOSIS — Z716 Tobacco abuse counseling: Secondary | ICD-10-CM

## 2017-01-11 DIAGNOSIS — Z811 Family history of alcohol abuse and dependence: Secondary | ICD-10-CM

## 2017-01-11 DIAGNOSIS — F1014 Alcohol abuse with alcohol-induced mood disorder: Secondary | ICD-10-CM

## 2017-01-11 DIAGNOSIS — F10239 Alcohol dependence with withdrawal, unspecified: Secondary | ICD-10-CM

## 2017-01-11 DIAGNOSIS — F1910 Other psychoactive substance abuse, uncomplicated: Secondary | ICD-10-CM

## 2017-01-11 DIAGNOSIS — E440 Moderate protein-calorie malnutrition: Secondary | ICD-10-CM

## 2017-01-11 NOTE — [facility] Suicide Risk Assessment (Signed)
Suicide Risk Assessment  Discharge Assessment   [facility]   Principal Problem: Alcohol abuse with alcohol-induced mood disorder [facility](HCC) Discharge Diagnoses:  Patient Active Problem List   Diagnosis Date Noted  . Alcohol abuse with alcohol-induced mood disorder (HCC) [F10.14] 12/13/2015    Priority: High  . Malnutrition of moderate degree [E44.0] 01/09/2017  . Tobacco abuse counseling [Z71.6] 01/09/2017  . Hyperglycemia [R73.9] 01/06/2017  . Alcohol withdrawal seizure (HCC) [Z61.096[F10.239, R56.9] 01/06/2017  . Hypomagnesemia [E83.42] 10/14/2016  . Alcohol withdrawal (HCC) [F10.239] 10/13/2016  . Scalp laceration [S01.01XA] 05/01/2016  . Acute blood loss anemia [D62] 05/01/2016  . Thrombocytopenia (HCC) [D69.6] 05/01/2016  . Blunt head trauma [S09.8XXA] 04/28/2016  . Bipolar I disorder (HCC) [F31.9] 02/14/2015    Total Time spent with patient: 45 minutes   Musculoskeletal: Strength & Muscle Tone: within normal limits Gait & Station: normal Patient leans: N/A  Psychiatric Specialty Exam: Physical Exam  Constitutional: He is oriented to person, place, and time. He appears well-developed and well-nourished.  HENT:  Head: Normocephalic.  Neck: Normal range of motion.  Respiratory: Effort normal.  Musculoskeletal: Normal range of motion.  Neurological: He is alert and oriented to person, place, and time.  Psychiatric: He has a normal mood and affect. His speech is normal and behavior is normal. Judgment and thought content normal. Cognition and memory are normal.    Review of Systems  Psychiatric/Behavioral: Positive for substance abuse.  All other systems reviewed and are negative.   Blood pressure (!) 143/84, pulse 93, temperature 99.2 F (37.3 C), temperature source Oral, resp. rate 16, SpO2 99 %.There is no height or weight on file to calculate BMI.  General Appearance: Casual  Eye Contact:  Good  Speech:  Normal Rate  Volume:  Normal  Mood:   Irritable  Affect:  Congruent  Thought Process:  Coherent and Descriptions of Associations: Intact  Orientation:  Full (Time, Place, and Person)  Thought Content:  WDL and Logical  Suicidal Thoughts:  No  Homicidal Thoughts:  No  Memory:  Immediate;   Good Recent;   Good Remote;   Good  Judgement:  Fair  Insight:  Fair  Psychomotor Activity:  Normal  Concentration:  Concentration: Good and Attention Span: Good  Recall:  Good  Fund of Knowledge:  Fair  Language:  Good  Akathisia:  No  Handed:  Right  AIMS (if indicated):     Assets:  Leisure Time Physical Health Resilience  ADL's:  Intact  Cognition:  WNL  Sleep:       Mental Status Per Nursing Assessment::   On Admission:   alcohol intoxication with suicidal ideations  Demographic Factors:  Male and Caucasian  Loss Factors: NA  Historical Factors: NA  Risk Reduction Factors:   Sense of responsibility to family  Continued Clinical Symptoms:  Irritable   Cognitive Features That Contribute To Risk:  None    Suicide Risk:  Minimal: No identifiable suicidal ideation.  Patients presenting with no risk factors but with morbid ruminations; may be classified as minimal risk based on the severity of the depressive symptoms    Plan Of Care/Follow-up recommendations:  Activity:  as tolerated Diet:  heart healthy diet  Emiya Loomer, NP 01/11/2017, 12:22 PM

## 2017-01-11 NOTE — Consult Note (Signed)
[facility] Psychiatry Consult   Reason for Consult:  Alcohol abuse with suicidal ideations Referring Physician:  EDP Patient Identification: Jordan Davidson MRN:  [id number] Principal Diagnosis: Alcohol abuse with alcohol-induced mood disorder [facility]) Diagnosis:   Patient Active Problem List   Diagnosis Date Noted  . Alcohol abuse with alcohol-induced mood disorder ([place]) [F10.14] 12/13/2015    Priority: High  . Malnutrition of moderate degree [E44.0] 01/09/2017  . Tobacco abuse counseling [Z71.6] 01/09/2017  . Hyperglycemia [R73.9] 01/06/2017  . Alcohol withdrawal seizure ([place]) [A83.419, R56.9] 01/06/2017  . Hypomagnesemia [E83.42] 10/14/2016  . Alcohol withdrawal ([place]) [F10.239] 10/13/2016  . Scalp laceration [S01.01XA] 05/01/2016  . Acute blood loss anemia [D62] 05/01/2016  . Thrombocytopenia ([place]) [D69.6] 05/01/2016  . Blunt head trauma [S09.8XXA] 04/28/2016  . Bipolar I disorder ([place]) [F31.9] 02/14/2015    Total Time spent with patient: 45 minutes  Subjective:   Jordan Davidson is a 46 y.o. male patient does not warrant admission.  HPI:  46 yo male who presented to the ED under the influence of alcohol and having suicidal ideations.  Today, he is clear and coherent with no suicidal/homicidal ideations, hallucinations, or withdrawal symptoms.  He is well known to this ED and providers for similar presentation, multiple admissions to inpatient and rehab.  Unfortunately, he does not go to his follow-up appointment and returns to drinking.  Stable for discharge.  Past Psychiatric History: substance abuse  Risk to Self: None Risk to Others: None Prior Inpatient Therapy: Prior Inpatient Therapy: Yes Prior Therapy Dates: 2016 Prior Therapy Facilty/Provider(s): [facility] Reason for Treatment: BIPOLAR, SI, SA Prior Outpatient Therapy: Prior Outpatient Therapy: No Prior Therapy Dates: pt denies Does patient have an ACCT team?: No Does patient have Intensive In-House Services?   : No Does patient have [facility] services? : No Does patient have P4CC services?: No  Past Medical History:  Past Medical History:  Diagnosis Date  . Alcoholism ([place])   . Bipolar 1 disorder, depressed ([place])   . Depression   . Thrombocytopenia ([place])     Past Surgical History:  Procedure Laterality Date  . NO PAST SURGERIES     Family History:  Family History  Problem Relation Age of Onset  . Hypertension Mother   . Cancer Father   . Heart failure Father   . Alcoholism Father    Family Psychiatric  History: substance abuse Social History:  History  Alcohol Use  . Yes    Comment: daily - "a lot... as much as I can until I pass out" - 1/5 plus daily     History  Drug Use No    Social History   Social History  . Marital status: Single    Spouse name: N/A  . Number of children: N/A  . Years of education: N/A   Occupational History  . 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 Smoking status: Current Every Day Smoker    Packs/day: 1.00    Years: 38.00    Types: Cigarettes  . Smokeless tobacco: Never Used  . Alcohol use Yes     Comment: daily - "a lot... as much as I can until I pass out" - 1/5 plus daily  . Drug use: No  . Sexual activity: No   Other Topics Concern  . Not on file   Social History Narrative   ** Merged History Encounter **       ** Merged History Encounter **  Additional Social History:    Allergies:  No Known Allergies  Labs:  Results for orders placed or performed during the hospital encounter of 01/10/17 (from the past 48 hour(s))  Comprehensive metabolic panel     Status: Abnormal   Collection Time: 01/10/17  4:05 PM  Result Value Ref Range   Sodium 135 135 - 145 mmol/L   Potassium 3.8 3.5 - 5.1 mmol/L   Chloride 101 101 - 111 mmol/L   CO2 23 22 - 32 mmol/L   Glucose, Bld 103 (H) 65 - 99 mg/dL   BUN 9 6 - 20 mg/dL   Creatinine, Ser 0.62 0.61 - 1.24 mg/dL   Calcium 8.6 (L) 8.9 - 10.3 mg/dL   Total Protein  7.1 6.5 - 8.1 g/dL   Albumin 4.1 3.5 - 5.0 g/dL   AST 80 (H) 15 - 41 U/L   ALT 53 17 - 63 U/L   Alkaline Phosphatase 85 38 - 126 U/L   Total Bilirubin 0.4 0.3 - 1.2 mg/dL   GFR calc non Af Amer >60 >60 mL/min   GFR calc Af Amer >60 >60 mL/min    Comment: (NOTE) The eGFR has been calculated using the CKD EPI equation. This calculation has not been validated in all clinical situations. eGFR's persistently <60 mL/min signify possible Chronic Kidney Disease.    Anion gap 11 5 - 15  Ethanol     Status: Abnormal   Collection Time: 01/10/17  4:05 PM  Result Value Ref Range   Alcohol, Ethyl (B) 324 (HH) <5 mg/dL    Comment:        LOWEST DETECTABLE LIMIT FOR SERUM ALCOHOL IS 5 mg/dL FOR MEDICAL PURPOSES ONLY CRITICAL RESULT CALLED TO, READ BACK BY AND VERIFIED WITH: C.CARNEAL RN AT 1735 ON 01/10/17 BY S.VANHOORNE   cbc     Status: Abnormal   Collection Time: 01/10/17  4:05 PM  Result Value Ref Range   WBC 7.5 4.0 - 10.5 K/uL   RBC 4.17 (L) 4.22 - 5.81 MIL/uL   Hemoglobin 13.4 13.0 - 17.0 g/dL   HCT 37.5 (L) 39.0 - 52.0 %   MCV 89.9 78.0 - 100.0 fL   MCH 32.1 26.0 - 34.0 pg   MCHC 35.7 30.0 - 36.0 g/dL   RDW 15.7 (H) 11.5 - 15.5 %   Platelets 86 (L) 150 - 400 K/uL    Comment: SPECIMEN CHECKED FOR CLOTS REPEATED TO VERIFY PLATELET COUNT CONFIRMED BY SMEAR   Rapid urine drug screen (hospital performed)     Status: Abnormal   Collection Time: 01/10/17  4:58 PM  Result Value Ref Range   Opiates NONE DETECTED NONE DETECTED   Cocaine NONE DETECTED NONE DETECTED   Benzodiazepines POSITIVE (A) NONE DETECTED   Amphetamines NONE DETECTED NONE DETECTED   Tetrahydrocannabinol NONE DETECTED NONE DETECTED   Barbiturates NONE DETECTED NONE DETECTED    Comment:        DRUG SCREEN FOR MEDICAL PURPOSES ONLY.  IF CONFIRMATION IS NEEDED FOR ANY PURPOSE, NOTIFY LAB WITHIN 5 DAYS.        LOWEST DETECTABLE LIMITS FOR URINE DRUG SCREEN Drug Class       Cutoff (ng/mL) Amphetamine       1000 Barbiturate      200 Benzodiazepine   800 Tricyclics       349 Opiates          300 Cocaine          300 THC  50     Current Facility-Administered Medications  Medication Dose Route Frequency Provider Last Rate Last Dose  . acetaminophen (TYLENOL) tablet 650 mg  650 mg Oral Q4H PRN Daleen Bo, MD      . alum & mag hydroxide-simeth (MAALOX/MYLANTA) 200-200-20 MG/5ML suspension 30 mL  30 mL Oral Q6H PRN Daleen Bo, MD      . LORazepam (ATIVAN) injection 0-4 mg  0-4 mg Intravenous Q6H Daleen Bo, MD       Or  . LORazepam (ATIVAN) tablet 0-4 mg  0-4 mg Oral Q6H Daleen Bo, MD   Stopped at 01/11/17 0818  . [START ON 01/13/2017] LORazepam (ATIVAN) injection 0-4 mg  0-4 mg Intravenous Q12H Daleen Bo, MD       Or  . Derrill Memo ON 01/13/2017] LORazepam (ATIVAN) tablet 0-4 mg  0-4 mg Oral Q12H Daleen Bo, MD      . nicotine (NICODERM CQ - dosed in mg/24 hours) patch 21 mg  21 mg Transdermal Daily Daleen Bo, MD      . ondansetron [facility]) tablet 4 mg  4 mg Oral Q8H PRN Daleen Bo, MD      . thiamine (VITAMIN B-1) tablet 100 mg  100 mg Oral Daily Daleen Bo, MD   100 mg at 01/11/17 1039   Or  . thiamine (B-1) injection 100 mg  100 mg Intravenous Daily Daleen Bo, MD       Current Outpatient Prescriptions  Medication Sig Dispense Refill  . OLANZapine (ZYPREXA) 10 MG tablet Take 10 mg by mouth at bedtime.     . feeding supplement, ENSURE ENLIVE, (ENSURE ENLIVE) LIQD Take 237 mLs by mouth 3 (three) times daily between meals. (Patient not taking: Reported on 01/10/2017) 237 mL 12  . LORazepam (ATIVAN) 1 MG tablet Take 1 tablet (1 mg total) by mouth every 8 (eight) hours as needed for anxiety (tremors, alcohol withdrawal). (Patient not taking: Reported on 01/10/2017) 10 tablet 0  . nicotine (NICODERM CQ - DOSED IN MG/24 HOURS) 14 mg/24hr patch Place 1 patch (14 mg total) onto the skin daily. (Patient not taking: Reported on 01/10/2017) 28 patch 0     Musculoskeletal: Strength & Muscle Tone: within normal limits Gait & Station: normal Patient leans: N/A  Psychiatric Specialty Exam: Physical Exam  Constitutional: He is oriented to person, place, and time. He appears well-developed and well-nourished.  HENT:  Head: Normocephalic.  Neck: Normal range of motion.  Respiratory: Effort normal.  Musculoskeletal: Normal range of motion.  Neurological: He is alert and oriented to person, place, and time.  Psychiatric: He has a normal mood and affect. His speech is normal and behavior is normal. Judgment and thought content normal. Cognition and memory are normal.    Review of Systems  Psychiatric/Behavioral: Positive for substance abuse.  All other systems reviewed and are negative.   Blood pressure (!) 143/84, pulse 93, temperature 99.2 F (37.3 C), temperature source Oral, resp. rate 16, SpO2 99 %.There is no height or weight on file to calculate BMI.  General Appearance: Casual  Eye Contact:  Good  Speech:  Normal Rate  Volume:  Normal  Mood:  Irritable  Affect:  Congruent  Thought Process:  Coherent and Descriptions of Associations: Intact  Orientation:  Full (Time, Place, and Person)  Thought Content:  WDL and Logical  Suicidal Thoughts:  No  Homicidal Thoughts:  No  Memory:  Immediate;   Good Recent;   Good Remote;   Good  Judgement:  Fair  Insight:  Fair  Psychomotor Activity:  Normal  Concentration:  Concentration: Good and Attention Span: Good  Recall:  Good  Fund of Knowledge:  Fair  Language:  Good  Akathisia:  No  Handed:  Right  AIMS (if indicated):     Assets:  Leisure Time Physical Health Resilience  ADL's:  Intact  Cognition:  WNL  Sleep:        Treatment Plan Summary: Daily contact with patient to assess and evaluate symptoms and progress in treatment, Medication management and Plan alcohol abuse with alcohol induced mood disorder:  -Crisis stabilization -Medication management:  Ativan alcohol  detox protocol started while inpatient -Individual and substance abuse counseling -Outpatient resources  Disposition: No evidence of imminent risk to self or others at present.    Waylan Boga, NP 01/11/2017 11:38 AM  Patient seen face-to-face for psychiatric evaluation, chart reviewed and case discussed with the physician extender and developed treatment plan. Reviewed the information documented and agree with the treatment plan. Corena Pilgrim, MD

## 2017-01-11 NOTE — Progress Notes (Signed)
CSW spoke with patient via bedside regarding discharge plans. Patient is familiar with services in the community and states he is "living on the streets". CSW provided shelter resources for patient and provided RN with bus pass, per patients request.   Stacy GardnerErin Elisheva Fallas, [facility] Emergency Room Clinical Social Worker [phone]

## 2017-01-11 NOTE — BH Assessment (Signed)
[facility] Assessment Progress Note  Per Thedore MinsMojeed Akintayo, MD, this pt does not require psychiatric hospitalization at this time.  [facility] with referral information for area substance abuse treatment providers.  He also needs information about supportive services for the homeless.  These have been included in pt's discharge instructions.  Pt's nurse, Kendal Hymendie, has been notified.  Doylene Canninghomas Umar Patmon, MA Triage Specialist [phone]

## 2017-01-11 NOTE — ED Notes (Signed)
Pt discharged safely with resources and discharge instructions.  All belongings were returned to pt.  Bus pass was given.  Pt was in no distress at discharge.

## 2017-01-11 NOTE — Discharge Instructions (Addendum)
To help you maintain a sober lifestyle, a substance abuse treatment program may be beneficial to you.  Contact one of the following facilities at your earliest opportunity to ask about enrolling: ° °RESIDENTIAL PROGRAMS: ° °     [facility] °     [address] °     [place], [place] [address] °     [phone] ° °     [facility] °     [address] °     [place], [place] [address] °     [phone] ° °     [facility] °     [address] °     [place], [place] [address] °     [phone] ° °OUTPATIENT PROGRAMS: ° °     [facility] °     [address] °     [place], [place] [address] °     [phone] °     New patients are seen at the walk-in clinic every Tuesday from 9:00 am - 12:00 pm ° °For your shelter needs, contact the following service providers: ° °     [facility] (operated by [facility]) °     [address] °     [place], [place] [address] °     [phone] ° °     [facility] °     [address] °     [place], [place] [address] °     [phone] ° °For day shelter and other supportive services for the homeless, contact the [facility]): ° °     [facility] °     [address] °     [place], [place] [address] °     [phone] ° °For transitional housing, contact one of the following agencies.  They provide longer term housing than a shelter, but there is an application process: ° °     [facility] °     [address] °     [place], [place] [address] °     [phone] ° °     [facility] °     [facility] °     [address] °     [place],  [address] °     [phone] °

## 2017-01-12 ENCOUNTER — Encounter (HOSPITAL_COMMUNITY): Payer: Self-pay | Admitting: Emergency Medicine

## 2017-01-12 ENCOUNTER — Emergency Department (HOSPITAL_COMMUNITY)
Admission: EM | Admit: 2017-01-12 | Discharge: 2017-01-12 | Disposition: A | Discharge status: Home / Self Care | Payer: Self-pay | Attending: Emergency Medicine | Admitting: Emergency Medicine

## 2017-01-12 DIAGNOSIS — F10920 Alcohol use, unspecified with intoxication, uncomplicated: Secondary | ICD-10-CM | POA: Insufficient documentation

## 2017-01-12 DIAGNOSIS — Z79899 Other long term (current) drug therapy: Secondary | ICD-10-CM | POA: Insufficient documentation

## 2017-01-12 DIAGNOSIS — F17210 Nicotine dependence, cigarettes, uncomplicated: Secondary | ICD-10-CM | POA: Insufficient documentation

## 2017-01-12 NOTE — ED Triage Notes (Signed)
Pt BIB [facility] after being found intoxicated. Hx of same. Alert and oriented. Ambulatory.

## 2017-01-12 NOTE — ED Notes (Addendum)
Pt notified that he is being discharged. Pt refusing to sign discharge papers at this time. Pt given belongings and will be taken to lobby via wheelchair.

## 2017-01-12 NOTE — ED Provider Notes (Signed)
[facility]-EMERGENCY DEPT Provider Note   CSN: [id number] Arrival date & time: 01/12/17  1706  By signing my name below, I, [name], attest that this documentation has been prepared under the direction and in the presence of Raeford Razor, MD. Electronically Signed: [name], ED Scribe. 01/12/17. 6:22 PM.  History   Chief Complaint Chief Complaint  Patient presents with  . Alcohol Intoxication   LEVEL V CAVEAT: HPI and ROS limited due to alcohol intoxication  The history is provided by the patient. History limited by: EtOH intoxication. No language interpreter was used.    HPI Comments: Jordan Davidson is a 46 y.o. male BIB EMS, with a h/o alcoholism, bipolar 1 disorder, depression, who presents to the Emergency Department with alcohol intoxication. Pt is brought in by [facility] after being found intoxicated in public. Pt reports that he drank approximately 4x40oz beers. He is currently homeless and states that he is attempting to find housing. Additionally, he reports that he has been increasingly suicidal over the past two days. He states that his plan is to walk in traffic. No specific inciting factors. He has previously attempted to commit suicide by overdosing in the past. He is a current, everyday smoker. No illicit drug usage. No HI, AVH.   Past Medical History:  Diagnosis Date  . Alcoholism (HCC)   . Bipolar 1 disorder, depressed (HCC)   . Depression   . Thrombocytopenia [facility])    Patient Active Problem List   Diagnosis Date Noted  . Malnutrition of moderate degree 01/09/2017  . Tobacco abuse counseling 01/09/2017  . Hyperglycemia 01/06/2017  . Alcohol withdrawal seizure (HCC) 01/06/2017  . Hypomagnesemia 10/14/2016  . Alcohol withdrawal (HCC) 10/13/2016  . Scalp laceration 05/01/2016  . Acute blood loss anemia 05/01/2016  . Thrombocytopenia (HCC) 05/01/2016  . Blunt head trauma 04/28/2016  . Alcohol abuse with alcohol-induced mood disorder (HCC)  12/13/2015  . Bipolar I disorder (HCC) 02/14/2015   Past Surgical History:  Procedure Laterality Date  . NO PAST SURGERIES      Home Medications    Prior to Admission medications   Medication Sig Start Date End Date Taking? Authorizing Provider  feeding supplement, ENSURE ENLIVE, (ENSURE ENLIVE) LIQD Take 237 mLs by mouth 3 (three) times daily between meals. Patient not taking: Reported on 01/10/2017 01/09/17   Dhungel, Theda Belfast, MD  LORazepam (ATIVAN) 1 MG tablet Take 1 tablet (1 mg total) by mouth every 8 (eight) hours as needed for anxiety (tremors, alcohol withdrawal). Patient not taking: Reported on 01/10/2017 01/09/17   Dhungel, Theda Belfast, MD  nicotine (NICODERM CQ - DOSED IN MG/24 HOURS) 14 mg/24hr patch Place 1 patch (14 mg total) onto the skin daily. Patient not taking: Reported on 01/10/2017 01/10/17   Dhungel, Theda Belfast, MD  OLANZapine (ZYPREXA) 10 MG tablet Take 10 mg by mouth at bedtime.     [provider]   Family History Family History  Problem Relation Age of Onset  . Hypertension Mother   . Cancer Father   . Heart failure Father   . Alcoholism Father    Social History Social History  Substance Use Topics  . Smoking status: Current Every Day Smoker    Packs/day: 1.00    Years: 38.00    Types: Cigarettes  . Smokeless tobacco: Never Used  . Alcohol use Yes     Comment: daily - "a lot... as much as I can until I pass out" - 1/5 plus daily   Allergies   Patient  has no known allergies.  Review of Systems Review of Systems  Unable to perform ROS: Other (intoxication)   Physical Exam Updated Vital Signs BP 112/80 (BP Location: Right Arm)   Pulse (!) 102   Temp 98.6 F (37 C) (Oral)   Resp 16   SpO2 96%   Physical Exam  Constitutional: He appears well-developed and well-nourished.  Slurred speech but understandable. Appears intoxicated.   HENT:  Head: Normocephalic.  Right Ear: External ear normal.  Left Ear: External ear normal.  Nose: Nose  normal.  Eyes: Conjunctivae are normal. Right eye exhibits no discharge. Left eye exhibits no discharge.  Neck: Normal range of motion.  Cardiovascular: Normal rate, regular rhythm and normal heart sounds.   No murmur heard. Pulmonary/Chest: Effort normal and breath sounds normal. No respiratory distress. He has no wheezes. He has no rales.  Abdominal: Soft. There is no tenderness. There is no rebound and no guarding.  Musculoskeletal: Normal range of motion. He exhibits no edema or tenderness.  Neurological: He is alert.  Skin: Skin is warm and dry. No rash noted. No erythema. No pallor.  Psychiatric: He has a normal mood and affect. His behavior is normal.  Nursing note and vitals reviewed.  ED Treatments / Results  DIAGNOSTIC STUDIES: Oxygen Saturation is 96% on RA, normal by my interpretation.   Labs (all labs ordered are listed, but only abnormal results are displayed) Labs Reviewed - No data to display  EKG  EKG Interpretation None      Radiology No results found.  Procedures Procedures   Medications Ordered in ED Medications - No data to display  Initial Impression / Assessment and Plan / ED Course  I have reviewed the triage vital signs and the nursing notes.  Pertinent labs & imaging results that were available during my care of the patient were reviewed by me and considered in my medical decision making (see chart for details).     Drunk. frequent ED visits for the same. Outpt resources.   Final Clinical Impressions(s) / ED Diagnoses   Final diagnoses:  Alcoholic intoxication without complication (HCC)   New Prescriptions New Prescriptions   No medications on file   I personally preformed the services scribed in my presence. The recorded information has been reviewed is accurate. Raeford RazorStephen Pawel Soules, MD.     Raeford RazorKohut, Otniel Hoe, MD 01/28/17 [phone]

## 2017-01-16 ENCOUNTER — Encounter (HOSPITAL_COMMUNITY): Payer: Self-pay | Admitting: Emergency Medicine

## 2017-01-16 ENCOUNTER — Emergency Department (HOSPITAL_COMMUNITY)
Admission: EM | Admit: 2017-01-16 | Discharge: 2017-01-16 | Disposition: A | Discharge status: Home / Self Care | Payer: Self-pay | Attending: Emergency Medicine | Admitting: Emergency Medicine

## 2017-01-16 DIAGNOSIS — Z79899 Other long term (current) drug therapy: Secondary | ICD-10-CM | POA: Insufficient documentation

## 2017-01-16 DIAGNOSIS — F10929 Alcohol use, unspecified with intoxication, unspecified: Secondary | ICD-10-CM | POA: Insufficient documentation

## 2017-01-16 DIAGNOSIS — F17210 Nicotine dependence, cigarettes, uncomplicated: Secondary | ICD-10-CM | POA: Insufficient documentation

## 2017-01-16 NOTE — ED Provider Notes (Signed)
[facility]ER[facility] DEPT Provider Note   CSN: [id number] Arrival date & time: 01/16/17  1956     History   Chief Complaint Chief Complaint  Patient presents with  . Alcohol Intoxication    HPI Jordan Davidson is a 46 y.o. male.  HPI  46 y.o. male with a hx of Alcoholism, Bipolar I disorder, Depression, presents to the Emergency Department today via EMS due to ETOH intoxication. Pt states that he was outside of a church where he normally sleeps and states he was too drunk to stand up. Notes drinking eight 40z bottles of beer. Notes this is usual. No drug abuse. Denies trauma or falls. Pt states he just couldn't stand up. Notes a bystander noticed him on the floor and called EMS. Notes generalized aches and pains and this is baseline. No N/V. No CP/SOB/ABD pain. No headaches. No fevers. Denies SI or HI. No visual or auditory disturbances. Pt with care plan with hx same. No other symptoms noted.    Past Medical History:  Diagnosis Date  . Alcoholism (HCC)   . Bipolar 1 disorder, depressed (HCC)   . Depression   . Thrombocytopenia [facility](HCC)     Patient Active Problem List   Diagnosis Date Noted  . Malnutrition of moderate degree 01/09/2017  . Tobacco abuse counseling 01/09/2017  . Hyperglycemia 01/06/2017  . Alcohol withdrawal seizure (HCC) 01/06/2017  . Hypomagnesemia 10/14/2016  . Alcohol withdrawal (HCC) 10/13/2016  . Scalp laceration 05/01/2016  . Acute blood loss anemia 05/01/2016  . Thrombocytopenia (HCC) 05/01/2016  . Blunt head trauma 04/28/2016  . Alcohol abuse with alcohol-induced mood disorder (HCC) 12/13/2015  . Bipolar I disorder (HCC) 02/14/2015    Past Surgical History:  Procedure Laterality Date  . NO PAST SURGERIES         Home Medications    Prior to Admission medications   Medication Sig Start Date End Date Taking? Authorizing Provider  feeding supplement, ENSURE ENLIVE, (ENSURE ENLIVE) LIQD Take 237 mLs by mouth 3 (three) times daily between  meals. Patient not taking: Reported on 01/10/2017 01/09/17   Dhungel, Theda BelfastNishant, MD  LORazepam (ATIVAN) 1 MG tablet Take 1 tablet (1 mg total) by mouth every 8 (eight) hours as needed for anxiety (tremors, alcohol withdrawal). Patient not taking: Reported on 01/10/2017 01/09/17   Dhungel, Theda BelfastNishant, MD  nicotine (NICODERM CQ - DOSED IN MG/24 HOURS) 14 mg/24hr patch Place 1 patch (14 mg total) onto the skin daily. Patient not taking: Reported on 01/10/2017 01/10/17   Dhungel, Theda BelfastNishant, MD  OLANZapine (ZYPREXA) 10 MG tablet Take 10 mg by mouth at bedtime.     [provider]    Family History Family History  Problem Relation Age of Onset  . Hypertension Mother   . Cancer Father   . Heart failure Father   . Alcoholism Father     Social History Social History  Substance Use Topics  . Smoking status: Current Every Day Smoker    Packs/day: 1.00    Years: 38.00    Types: Cigarettes  . Smokeless tobacco: Never Used  . Alcohol use Yes     Comment: daily - "a lot... as much as I can until I pass out" - 1/5 plus daily     Allergies   Patient has no known allergies.   Review of Systems Review of Systems ROS reviewed and all are negative for acute change except as noted in the HPI.  Physical Exam Updated Vital Signs BP 118/70 (BP Location: Left Arm)  Pulse (!) 103   Temp 98.3 F (36.8 C) (Oral)   Resp 19   Ht 6' (1.829 m)   Wt 70.3 kg (155 lb)   SpO2 93%   BMI 21.02 kg/m   Physical Exam  Constitutional: He is oriented to person, place, and time. Vital signs are normal. He appears well-developed and well-nourished.  HENT:  Head: Normocephalic and atraumatic.  Right Ear: Hearing normal.  Left Ear: Hearing normal.  Eyes: Pupils are equal, round, and reactive to light. Conjunctivae and EOM are normal.  Neck: Normal range of motion. Neck supple.  Cardiovascular: Normal rate, regular rhythm, normal heart sounds and intact distal pulses.   Pulmonary/Chest: Effort normal and  breath sounds normal.  Abdominal: Soft.  Musculoskeletal: Normal range of motion.  Neurological: He is alert and oriented to person, place, and time. He has normal strength. No cranial nerve deficit or sensory deficit.  Cranial Nerves:  II: Pupils equal, round, reactive to light III,IV, VI: ptosis not present, extra-ocular motions intact bilaterally  V,VII: smile symmetric, facial light touch sensation equal VIII: hearing grossly normal bilaterally  IX,X: midline uvula rise  XI: bilateral shoulder shrug equal and strong XII: midline tongue extension  Skin: Skin is warm and dry.  Psychiatric: He has a normal mood and affect. His speech is normal and behavior is normal. Thought content normal.  Nursing note and vitals reviewed.    ED Treatments / Results  Labs (all labs ordered are listed, but only abnormal results are displayed) Labs Reviewed - No data to display  EKG  EKG Interpretation None       Radiology No results found.  Procedures Procedures (including critical care time)  Medications Ordered in ED Medications - No data to display   Initial Impression / Assessment and Plan / ED Course  I have reviewed the triage vital signs and the nursing notes.  Pertinent labs & imaging results that were available during my care of the patient were reviewed by me and considered in my medical decision making (see chart for details).  Final Clinical Impressions(s) / ED Diagnoses     {I have reviewed the relevant previous healthcare records.  {I obtained HPI from historian.   ED Course:  Assessment: Pt is a 46 y.o. male with a hx of Alcoholism, Bipolar I disorder, Depression, presents to the Emergency Department today via EMS due to ETOH intoxication. Pt states that he was outside of a church where he normally sleeps and states he was too drunk to stand up. Notes drinking eight 40z bottles of beer. Notes this is usual. No drug abuse. Denies trauma or falls. Pt states he just  couldn't stand up. Notes a bystander noticed him on the floor and called EMS. Notes generalized aches and pains and this is baseline. No N/V. No CP/SOB/ABD pain. No headaches. No fevers. Denies SI or HI. No visual or auditory disturbances. Pt with care plan with hx same.. On exam, pt in NAD. Nontoxic/nonseptic appearing. VSS. Afebrile. Lungs CTA. Heart RRR. Abdomen nontender soft. Pt clinically intoxicated. Pt with hx same with care plan. No acute emergency identified on examination. Pt requesting place to sleep and to eat.  Plan is to DC home with follow up to PCP. At time of discharge, Patient is in no acute distress. Vital Signs are stable. Patient is able to ambulate. Patient able to tolerate PO.   Disposition/Plan:  DC Home Additional Verbal discharge instructions given and discussed with patient.  Pt Instructed to f/u with  PCP in the next week for evaluation and treatment of symptoms. Return precautions given Pt acknowledges and agrees with plan  Supervising Physician Tegeler, Canary Brim, * Final diagnoses:  Alcoholic intoxication with complication [facility])    New Prescriptions New Prescriptions   No medications on file     Audry Pili, Cordelia Poche 01/16/17 2218    Tegeler, Canary Brim, MD 01/17/17 [phone]

## 2017-01-16 NOTE — Discharge Instructions (Signed)
Please read and follow all provided instructions.  Your diagnoses today include:  1. Alcoholic intoxication with complication (HCC)     Tests performed today include: Vital signs. See below for your results today.   Medications prescribed:  Take as prescribed   Home care instructions:  Follow any educational materials contained in this packet.  Follow-up instructions: Please follow-up with your primary care provider for further evaluation of symptoms and treatment   Return instructions:  Please return to the Emergency Department if you do not get better, if you get worse, or new symptoms OR  - Fever (temperature greater than 101.59F)  - Bleeding that does not stop with holding pressure to the area    -Severe pain (please note that you may be more sore the day after your accident)  - Chest Pain  - Difficulty breathing  - Severe nausea or vomiting  - Inability to tolerate food and liquids  - Passing out  - Skin becoming red around your wounds  - Change in mental status (confusion or lethargy)  - New numbness or weakness    Please return if you have any other emergent concerns.  Additional Information:  Your vital signs today were: BP 118/70 (BP Location: Left Arm)    Pulse (!) 103    Temp 98.3 F (36.8 C) (Oral)    Resp 19    Ht 6' (1.829 m)    Wt 70.3 kg (155 lb)    SpO2 93%    BMI 21.02 kg/m  If your blood pressure (BP) was elevated above 135/85 this visit, please have this repeated by your doctor within one month. ---------------

## 2017-01-16 NOTE — ED Notes (Signed)
Pt stated he wanted to leave. He refused dc vital signs. Pt refused to stay and talk to EDP. Pt was ambulatory with a steady gait

## 2017-01-16 NOTE — ED Triage Notes (Signed)
Pt comes in via EMS with complaints of alcohol intoxication.  Pt was unable to walk without assistance.  Vitals WNL.

## 2017-01-20 ENCOUNTER — Encounter (HOSPITAL_COMMUNITY): Payer: Self-pay | Admitting: Emergency Medicine

## 2017-01-20 ENCOUNTER — Emergency Department (HOSPITAL_COMMUNITY)
Admission: EM | Admit: 2017-01-20 | Discharge: 2017-01-20 | Disposition: A | Discharge status: Home / Self Care | Payer: Self-pay | Attending: Emergency Medicine | Admitting: Emergency Medicine

## 2017-01-20 DIAGNOSIS — F10920 Alcohol use, unspecified with intoxication, uncomplicated: Secondary | ICD-10-CM | POA: Insufficient documentation

## 2017-01-20 DIAGNOSIS — Z79899 Other long term (current) drug therapy: Secondary | ICD-10-CM | POA: Insufficient documentation

## 2017-01-20 DIAGNOSIS — F17210 Nicotine dependence, cigarettes, uncomplicated: Secondary | ICD-10-CM | POA: Insufficient documentation

## 2017-01-20 DIAGNOSIS — F1020 Alcohol dependence, uncomplicated: Secondary | ICD-10-CM | POA: Insufficient documentation

## 2017-01-20 MED ORDER — CHLORDIAZEPOXIDE HCL 25 MG PO CAPS: 25.0000 mg | ORAL_CAPSULE | Freq: Once | ORAL | Status: DC

## 2017-01-20 MED ORDER — OLANZAPINE 10 MG PO TABS: 10.0000 mg | ORAL_TABLET | Freq: Every day | ORAL | 0 refills | Status: AC

## 2017-01-20 MED ORDER — CHLORDIAZEPOXIDE HCL 25 MG PO CAPS: ORAL_CAPSULE | ORAL | 0 refills | Status: DC

## 2017-01-20 NOTE — Discharge Instructions (Signed)
You were seen here today for alcohol intoxication. Please make sure to take all medications at home as prescribed. Please follow-up with your primary care provider.  Get help right away if: You become shaky when you try to stop drinking. You shake uncontrollably (have a seizure). You vomit blood. Blood in vomit may look bright red, or it may look like coffee grounds. You have blood in your stool. Blood in stool may be bright red, or it may make stool appear black and tarry and make it smell bad. You become light-headed or you faint.  If you develop worsening or new concerning symptoms you can return to the emergency department for re-evaluation.

## 2017-01-20 NOTE — ED Notes (Signed)
Patient states he does not want his discharge instructions-patient dressed self and ambulated out of department.

## 2017-01-20 NOTE — ED Notes (Signed)
Pt belligerent and cussing towards staff. Pt sleeping and will vitals will be obtained once pt wakes up. Darl PikesSusan, RN aware.

## 2017-01-20 NOTE — ED Notes (Addendum)
Pt continuously asking for something to eat and trying to get out of wheelchair to walk to vending machine.  Pt states [facility] isn[facility] so I should be able to feed him.  Informed pt that I cannot give him anything to eat per Care Plan and that he can't walk due to being unsteady on his feet.  EMT taking pt to vending machine via wheelchair.

## 2017-01-20 NOTE — ED Notes (Signed)
Pt standing up again. Told pt to sit down and I will get a wheelchair to take him to bathroom

## 2017-01-20 NOTE — ED Notes (Signed)
Pt asking for food, informed him per his Care Plan we are not supposed to feed him, he can go to vending machine if he likes and get food

## 2017-01-20 NOTE — ED Provider Notes (Signed)
[facility]-EMERGENCY DEPT Provider Note   CSN: [id number] Arrival date & time: 01/20/17  0118  By signing my name below, I, Ny'Kea Lewis, attest that this documentation has been prepared under the direction and in the presence of Pollina, Canary Brim, *. Electronically Signed: Karren Cobble, ED Scribe. 01/20/17. 3:47 AM.  History   Chief Complaint Chief Complaint  Patient presents with  . Alcohol Problem   The history is provided by the patient. No language interpreter was used.   HPI Comments: Jordan Davidson is a 46 y.o. male with a PMHx of alcoholism, bipolar 1 disorder, and depression, brought in by ambulance, who presents to the Emergency Department for alcohol detox. Pt reports having three seizures in two days whlie he was in jail. He reports he was given Librium, but he reports since being released he believes he will go back to drinking alcohol and he would like assistance with detox as well as getting back on his medication. Denies fever, abdominal pain , or chest pain.   Past Medical History:  Diagnosis Date  . Alcoholism (HCC)   . Bipolar 1 disorder, depressed (HCC)   . Depression   . Thrombocytopenia [facility])    Patient Active Problem List   Diagnosis Date Noted  . Malnutrition of moderate degree 01/09/2017  . Tobacco abuse counseling 01/09/2017  . Hyperglycemia 01/06/2017  . Alcohol withdrawal seizure (HCC) 01/06/2017  . Hypomagnesemia 10/14/2016  . Alcohol withdrawal (HCC) 10/13/2016  . Scalp laceration 05/01/2016  . Acute blood loss anemia 05/01/2016  . Thrombocytopenia (HCC) 05/01/2016  . Blunt head trauma 04/28/2016  . Alcohol abuse with alcohol-induced mood disorder (HCC) 12/13/2015  . Bipolar I disorder (HCC) 02/14/2015   Past Surgical History:  Procedure Laterality Date  . NO PAST SURGERIES      Home Medications    Prior to Admission medications   Medication Sig Start Date End Date Taking? Authorizing Provider  chlordiazePOXIDE (LIBRIUM) 25 MG capsule  50mg  PO TID x 1D, then 25-50mg  PO BID X 1D, then 25-50mg  PO QD X 1D 01/20/17   Pollina, Canary Brim, MD  feeding supplement, ENSURE ENLIVE, (ENSURE ENLIVE) LIQD Take 237 mLs by mouth 3 (three) times daily between meals. Patient not taking: Reported on 01/10/2017 01/09/17   Dhungel, Theda Belfast, MD  LORazepam (ATIVAN) 1 MG tablet Take 1 tablet (1 mg total) by mouth every 8 (eight) hours as needed for anxiety (tremors, alcohol withdrawal). Patient not taking: Reported on 01/10/2017 01/09/17   Dhungel, Theda Belfast, MD  nicotine (NICODERM CQ - DOSED IN MG/24 HOURS) 14 mg/24hr patch Place 1 patch (14 mg total) onto the skin daily. Patient not taking: Reported on 01/10/2017 01/10/17   Dhungel, Theda Belfast, MD  OLANZapine (ZYPREXA) 10 MG tablet Take 1 tablet (10 mg total) by mouth at bedtime. 01/20/17   Gilda Crease, MD   Family History Family History  Problem Relation Age of Onset  . Hypertension Mother   . Cancer Father   . Heart failure Father   . Alcoholism Father     Social History Social History  Substance Use Topics  . Smoking status: Current Every Day Smoker    Packs/day: 1.00    Years: 38.00    Types: Cigarettes  . Smokeless tobacco: Never Used  . Alcohol use Yes     Comment: daily - "a lot... as much as I can until I pass out" - 1/5 plus daily   Allergies   Patient has no known allergies.  Review of Systems Review of  Systems  Constitutional: Negative for fever.  Cardiovascular: Negative for chest pain.  Gastrointestinal: Negative for abdominal pain.  All other systems reviewed and are negative.   Physical Exam Updated Vital Signs BP 124/79 (BP Location: Left Arm)   Pulse 96   Temp 98.9 F (37.2 C) (Oral)   Resp 18   SpO2 95%   Physical Exam  Constitutional: He is oriented to person, place, and time. He appears well-developed and well-nourished. No distress.  HENT:  Head: Normocephalic and atraumatic.  Right Ear: Hearing normal.  Left Ear: Hearing normal.  Nose: Nose  normal.  Mouth/Throat: Oropharynx is clear and moist and mucous membranes are normal.  Eyes: Pupils are equal, round, and reactive to light. Conjunctivae and EOM are normal.  Neck: Normal range of motion. Neck supple.  Cardiovascular: Regular rhythm, S1 normal and S2 normal.  Exam reveals no gallop and no friction rub.   No murmur heard. Pulmonary/Chest: Effort normal and breath sounds normal. No respiratory distress. He exhibits no tenderness.  Abdominal: Soft. Normal appearance and bowel sounds are normal. There is no hepatosplenomegaly. There is no tenderness. There is no rebound, no guarding, no tenderness at McBurney's point and negative Murphy's sign. No hernia.  Musculoskeletal: Normal range of motion.  Neurological: He is alert and oriented to person, place, and time. He has normal strength. No cranial nerve deficit or sensory deficit. Coordination normal. GCS eye subscore is 4. GCS verbal subscore is 5. GCS motor subscore is 6.  Skin: Skin is warm, dry and intact. No rash noted. No cyanosis.  Psychiatric: He has a normal mood and affect. His speech is normal and behavior is normal. Thought content normal.  Nursing note and vitals reviewed.   ED Treatments / Results  DIAGNOSTIC STUDIES: Oxygen Saturation is 95% on RA, normal by my interpretation.   COORDINATION OF CARE: 3:44 AM-Discussed next steps with pt. Pt verbalized understanding and is agreeable with the plan.   Labs (all labs ordered are listed, but only abnormal results are displayed) Labs Reviewed - No data to display  EKG  EKG Interpretation None      Radiology No results found.  Procedures Procedures (including critical care time)  Medications Ordered in ED Medications - No data to display   Initial Impression / Assessment and Plan / ED Course  I have reviewed the triage vital signs and the nursing notes.  Pertinent labs & imaging results that were available during my care of the patient were reviewed  by me and considered in my medical decision making (see chart for details).     Patient well-known to the emergency department for frequent visits secondary to alcohol abuse. Patient reports that he was just released from jail. He had been treated with Librium in jail but thinks he might have had a seizure anyway. He reports that he does not want to start the cycle of drinking again, wants help with his drinking. He is not homicidal or suicidal.  Patient appears well at this time. He does not appear to be significantly intoxicated, denies alcohol intake. He wants help with his alcohol, was instructed that he cannot be placed directly into detox from this emergency department. He reports that he got his routine medications he might be able to avoid drinking. Will provide Librium taper as well as routine meds, given alcohol resource information.  Final Clinical Impressions(s) / ED Diagnoses   Final diagnoses:  Uncomplicated alcohol dependence (HCC)    New Prescriptions New Prescriptions  CHLORDIAZEPOXIDE (LIBRIUM) 25 MG CAPSULE    50mg  PO TID x 1D, then 25-50mg  PO BID X 1D, then 25-50mg  PO QD X 1D  I personally performed the services described in this documentation, which was scribed in my presence. The recorded information has been reviewed and is accurate.     Gilda CreasePollina, Christopher J, MD 01/20/17 [phone]

## 2017-01-20 NOTE — ED Notes (Signed)
Pt walked out of room, stated he wanted food and was ready to be discharged. This RN explained she needed to give him his Librium and he stated he wanted to leave anyway. Dr. Oletta CohnPolina notified, pt walked out without meds or d/c papers.

## 2017-01-20 NOTE — ED Notes (Signed)
Pt came back in after smoking. Pt laying on couch sleeping.

## 2017-01-20 NOTE — ED Triage Notes (Signed)
Pt found sleeping behind [employer]. Pt is intoxicated, slurring words and belligerent. Pt sts that he wanted to come here to eat. EMS sts that they offered him food but he refused. [employer] at bedside. Pt sts that he wants to go ti jail but [employer] advises that pt is too intoxicated for jail at this time. Pt is sitting up in the bed stating he will leave if he doesn't get food. [employer] officers told pt that he is too intoxicated to leave. Pt in NAD

## 2017-01-20 NOTE — ED Triage Notes (Signed)
Pt to triage via [facility] for etoh detox.  Pt refused [facility].  No labs drawn per ED Care Plan.

## 2017-01-20 NOTE — ED Notes (Signed)
Pt continues to attempt to walk around the waiting room. Pt knows he a high fall risk and asked pt to remain seated. Pt was taken to bathroom by this RN. Pt also states he needs to smoke a cigarette. Encouraged pt to stay inside so he does not fall but pt refused. Pt went outside to smoke.

## 2017-01-20 NOTE — ED Notes (Signed)
Bed: WHALD Expected date:  Expected time:  Means of arrival:  Comments: 

## 2017-01-20 NOTE — ED Triage Notes (Signed)
Theone MurdochMike M PA in to assess pt. Pt noncompliant and resistant to direction. Cursing at staff.

## 2017-01-20 NOTE — ED Provider Notes (Signed)
[facility]-EMERGENCY DEPT Provider Note   CSN: [id number] Arrival date & time: 01/20/17  1616     History   Chief Complaint Chief Complaint  Patient presents with  . Alcohol Intoxication  . Hungry   Level V caveat: History of present illness and review of systems Limited due to alcohol intoxication.   HPI Jordan Davidson is a 46 y.o. male with past medical history of alcoholsim, bipolar 1 disorder and depression, is brought to the emergency department by EMS after being found behind sheets intoxicated, slurring words and belligerent. History provided by the patient is limited due to alcohol intoxication. Patient is unsure how many alcohol beverages he had tonight but states that it was "a lot". He is currently homeless and states that he has been attempting to find housing. Patient denies any active suicidal thoughts and does not have a current plan. He does have a history of previously attempting to commit suicide overdosing in the past. He denies homicidal ideation. He is a current every day smoker. No illicit drug use. No trauma or falls today. [facility] today provided with a Librium taper as well as routine meds and resource information. Denies fevers, chills, myalgias, arthralgias, DOE, SOB, chest tightness or pressure, diaphoresis, abdominal pain, headaches, light headedness, weakness, visual disturbance, nausea or emesis.   HPI  Past Medical History:  Diagnosis Date  . Alcoholism (HCC)   . Bipolar 1 disorder, depressed (HCC)   . Depression   . Thrombocytopenia [facility](HCC)     Patient Active Problem List   Diagnosis Date Noted  . Malnutrition of moderate degree 01/09/2017  . Tobacco abuse counseling 01/09/2017  . Hyperglycemia 01/06/2017  . Alcohol withdrawal seizure (HCC) 01/06/2017  . Hypomagnesemia 10/14/2016  . Alcohol withdrawal (HCC) 10/13/2016  . Scalp laceration 05/01/2016  . Acute blood loss anemia 05/01/2016  . Thrombocytopenia (HCC)  05/01/2016  . Blunt head trauma 04/28/2016  . Alcohol abuse with alcohol-induced mood disorder (HCC) 12/13/2015  . Bipolar I disorder (HCC) 02/14/2015    Past Surgical History:  Procedure Laterality Date  . NO PAST SURGERIES         Home Medications    Prior to Admission medications   Medication Sig Start Date End Date Taking? Authorizing Provider  chlordiazePOXIDE (LIBRIUM) 25 MG capsule 50mg  PO TID x 1D, then 25-50mg  PO BID X 1D, then 25-50mg  PO QD X 1D Patient not taking: Reported on 01/20/2017 01/20/17   Gilda CreasePollina, Christopher J, MD  feeding supplement, ENSURE ENLIVE, (ENSURE ENLIVE) LIQD Take 237 mLs by mouth 3 (three) times daily between meals. Patient not taking: Reported on 01/10/2017 01/09/17   Dhungel, Theda BelfastNishant, MD  LORazepam (ATIVAN) 1 MG tablet Take 1 tablet (1 mg total) by mouth every 8 (eight) hours as needed for anxiety (tremors, alcohol withdrawal). Patient not taking: Reported on 01/10/2017 01/09/17   Dhungel, Theda BelfastNishant, MD  nicotine (NICODERM CQ - DOSED IN MG/24 HOURS) 14 mg/24hr patch Place 1 patch (14 mg total) onto the skin daily. Patient not taking: Reported on 01/10/2017 01/10/17   Dhungel, Theda BelfastNishant, MD  OLANZapine (ZYPREXA) 10 MG tablet Take 1 tablet (10 mg total) by mouth at bedtime. Patient not taking: Reported on 01/20/2017 01/20/17   Gilda CreasePollina, Christopher J, MD    Family History Family History  Problem Relation Age of Onset  . Hypertension Mother   . Cancer Father   . Heart failure Father   . Alcoholism Father     Social History Social History  Substance Use Topics  . Smoking status: Current Every Day Smoker    Packs/day: 1.00    Years: 38.00    Types: Cigarettes  . Smokeless tobacco: Never Used  . Alcohol use Yes     Comment: daily - "a lot... as much as I can until I pass out" - 1/5 plus daily     Allergies   Patient has no known allergies.   Review of Systems Review of Systems  Unable to perform ROS: Other (Intoxication)  Level V  caveat   Physical Exam Updated Vital Signs BP 102/66 (BP Location: Left Arm)   Pulse 94   Temp 98.3 F (36.8 C) (Oral)   Resp 16   SpO2 94%   Physical Exam  Constitutional: He appears well-developed and well-nourished.  Slurred speech, appears intoxicated  HENT:  Head: Normocephalic and atraumatic.  Right Ear: External ear normal.  Left Ear: External ear normal.  Nose: Nose normal.  Mouth/Throat: Oropharynx is clear and moist.  Eyes: Pupils are equal, round, and reactive to light. Right eye exhibits no discharge. Left eye exhibits no discharge. No scleral icterus.  Neck: Neck supple.  Cardiovascular: Normal rate, regular rhythm and intact distal pulses.   No murmur heard. Pulses:      Radial pulses are 2+ on the right side, and 2+ on the left side.       Dorsalis pedis pulses are 2+ on the right side, and 2+ on the left side.       Posterior tibial pulses are 2+ on the right side, and 2+ on the left side.  No lower extremity swelling or edema. Calves symmetric in size bilaterally.  Pulmonary/Chest: Effort normal and breath sounds normal. He exhibits no tenderness.  Abdominal: Soft. Bowel sounds are normal. There is no tenderness. There is no rebound and no guarding.  Musculoskeletal: He exhibits no edema.  Lymphadenopathy:    He has no cervical adenopathy.  Neurological: He is alert.  PERRLA. EOMI. CN III-XII intact. Grossly moves all extremities 4 without ataxia. Coordination intact. Able and appropriate strength for age to upper and lower extremities bilaterally including grip strength. Sensation to light touch intact bilaterally for upper and lower. Gait able.     Skin: Skin is warm and dry. No rash noted. He is not diaphoretic.  Psychiatric: He has a normal mood and affect.  Yelling at staff  Nursing note and vitals reviewed.    ED Treatments / Results  Labs (all labs ordered are listed, but only abnormal results are displayed) Labs Reviewed - No data to  display  EKG  EKG Interpretation None       Radiology No results found.  Procedures Procedures (including critical care time)  Medications Ordered in ED Medications - No data to display   Initial Impression / Assessment and Plan / ED Course  I have reviewed the triage vital signs and the nursing notes.  Pertinent labs & imaging results that were available during my care of the patient were reviewed by me and considered in my medical decision making (see chart for details).     46 year old male who frequents the emergency department and is well known by staff for frequent visits secondary to alcohol abuse. The patient has a plan in place to avoid labs or consult thing TTS/social work unless patient has definitive plan of suicide. In addition states to avoid putting the patient and discharge from department when clinically sober and monitor until this point.  Patient presented today  via EMS for alcohol intoxication. After resting for some time the patient appears well and sober at this time. Vital signs are reassuring and he  does not appear clinically intoxicated. Patient denies suicidal or homicidal ideation.  The patient was seen at [facility] earlier today provided with a Librium taper as well as routine meds and resource information. Patient states that he would fill his medications and take them as prescribed.  The evaluation does not show pathology that would require ongoing emergent intervention or inpatient treatment. I advised the patient to follow-up with his PCP this week. I advised the patient to return to the emergency department with new or worsening symptoms or new concerns. Specific return precautions discussed. The patient verbalized understanding and agreement with plan. All questions answered. No further questions at this time. The patient is hemodynamically stable, mentating appropriately and appears safe for discharge.   Final Clinical Impressions(s) / ED  Diagnoses   Final diagnoses:  Alcoholic intoxication without complication [facility](HCC)    New Prescriptions Discharge Medication List as of 01/20/2017  9:25 PM       Jacinto HalimMaczis, Jasman Murri M, PA-C 01/20/17 2211    Shaune PollackIsaacs, Cameron, MD 01/21/17 1318

## 2017-01-21 ENCOUNTER — Emergency Department (HOSPITAL_COMMUNITY): Payer: Self-pay

## 2017-01-21 ENCOUNTER — Emergency Department (HOSPITAL_COMMUNITY)
Admission: EM | Admit: 2017-01-21 | Discharge: 2017-01-22 | Disposition: A | Discharge status: Home / Self Care | Payer: Self-pay | Attending: Emergency Medicine | Admitting: Emergency Medicine

## 2017-01-21 ENCOUNTER — Encounter (HOSPITAL_COMMUNITY): Payer: Self-pay | Admitting: Emergency Medicine

## 2017-01-21 DIAGNOSIS — F10920 Alcohol use, unspecified with intoxication, uncomplicated: Secondary | ICD-10-CM | POA: Insufficient documentation

## 2017-01-21 DIAGNOSIS — Y939 Activity, unspecified: Secondary | ICD-10-CM | POA: Insufficient documentation

## 2017-01-21 DIAGNOSIS — X58XXXA Exposure to other specified factors, initial encounter: Secondary | ICD-10-CM | POA: Insufficient documentation

## 2017-01-21 DIAGNOSIS — S0990XA Unspecified injury of head, initial encounter: Secondary | ICD-10-CM | POA: Insufficient documentation

## 2017-01-21 DIAGNOSIS — Y999 Unspecified external cause status: Secondary | ICD-10-CM | POA: Insufficient documentation

## 2017-01-21 DIAGNOSIS — Y92524 Gas station as the place of occurrence of the external cause: Secondary | ICD-10-CM | POA: Insufficient documentation

## 2017-01-21 DIAGNOSIS — F17210 Nicotine dependence, cigarettes, uncomplicated: Secondary | ICD-10-CM | POA: Insufficient documentation
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
## 2017-01-21 NOTE — ED Notes (Signed)
Bed: [facility] Expected date:  Expected time:  Means of arrival:  Comments: 

## 2017-01-22 ENCOUNTER — Encounter (HOSPITAL_COMMUNITY): Payer: Self-pay | Admitting: Emergency Medicine

## 2017-01-22 ENCOUNTER — Emergency Department (HOSPITAL_COMMUNITY)
Admission: EM | Admit: 2017-01-22 | Discharge: 2017-01-22 | Discharge status: Against Medical Advice | Payer: Self-pay | Attending: Emergency Medicine | Admitting: Emergency Medicine

## 2017-01-22 DIAGNOSIS — R1110 Vomiting, unspecified: Secondary | ICD-10-CM | POA: Insufficient documentation

## 2017-01-22 DIAGNOSIS — Z5321 Procedure and treatment not carried out due to patient leaving prior to being seen by health care provider: Secondary | ICD-10-CM | POA: Insufficient documentation

## 2017-01-22 NOTE — ED Notes (Signed)
Pt got up off stretcher and walked to restroom

## 2017-01-22 NOTE — ED Notes (Signed)
Patient still not in lobby

## 2017-01-22 NOTE — ED Notes (Signed)
Unable to obtain vital signs at this time due to patient not in lobby.

## 2017-01-22 NOTE — Discharge Instructions (Signed)
Please follow up with your family doctor as needed. Avoid drinking alcohol

## 2017-01-22 NOTE — ED Notes (Signed)
Did not see pt in lobby

## 2017-01-22 NOTE — ED Notes (Signed)
Pt decided to leave before discharge papers were printed. Pt told RN, "I know what to do."

## 2017-01-22 NOTE — ED Notes (Signed)
Pt given bus pass per request 
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
## 2017-01-22 NOTE — ED Provider Notes (Signed)
9:47 AM Patient signed out to me at shift change. Patient in emergency department with alcohol intoxication and fall with head trauma. CT scans unremarkable. Patient is sobering up. I went and reassessed the patient at this time. I was able to wake him up, he is alert and oriented 4. Moving all extremities. States he does not remember how he got here.  I brought him a Coke to drink. We will ambulate him in the hallway and if stable will discharge home.  9:58 AM Pt ambulating with no difficulty. Drank his coke with no problems. Stable for dc home.   Vitals:   01/22/17 0041 01/22/17 0306 01/22/17 0527 01/22/17 0756  BP: 105/78 96/61 (!) 91/58 117/71  Pulse: 74 78 82 81  Resp: 18 18 18 14   Temp:    (!) 97.4 F (36.3 C)  TempSrc:    Oral  SpO2: 94% 93% 95% 94%        Jaynie CrumbleKirichenko, Jarmarcus Wambold, PA-C 01/22/17 1000    Pricilla LovelessGoldston, Scott, MD 01/22/17 [phone]

## 2017-01-23 ENCOUNTER — Emergency Department (HOSPITAL_COMMUNITY)
Admission: EM | Admit: 2017-01-23 | Discharge: 2017-01-23 | Discharge status: Against Medical Advice | Payer: Self-pay | Attending: Emergency Medicine | Admitting: Emergency Medicine

## 2017-01-23 ENCOUNTER — Encounter (HOSPITAL_COMMUNITY): Payer: Self-pay

## 2017-01-23 DIAGNOSIS — Z5321 Procedure and treatment not carried out due to patient leaving prior to being seen by health care provider: Secondary | ICD-10-CM | POA: Insufficient documentation

## 2017-01-23 NOTE — ED Notes (Signed)
Staff called to Lobby d/t Pt needing help urinating.  Pt assisted to restroom.

## 2017-01-23 NOTE — ED Notes (Signed)
Pt continues to refuse to be seen.  Pt remains intoxicated and is sleeping in the lobby.  Security aware.

## 2017-01-23 NOTE — ED Notes (Signed)
[facility] Clinical [facility] was laying down sleep in waiting area. Woke PT up to be moved PT state he does not want to be seen. PT went back to sleep in waiting area.

## 2017-01-23 NOTE — ED Triage Notes (Signed)
Pt found sleeping at gas station by [employer], pt brought here for alcohol intoxication. Pt arrives alert, intoxicated and in no acute distress.

## 2017-01-24 ENCOUNTER — Encounter (HOSPITAL_COMMUNITY): Payer: Self-pay | Admitting: Emergency Medicine

## 2017-01-24 ENCOUNTER — Emergency Department (HOSPITAL_COMMUNITY)
Admission: EM | Admit: 2017-01-24 | Discharge: 2017-01-24 | Disposition: A | Discharge status: Home / Self Care | Payer: Self-pay | Attending: Emergency Medicine | Admitting: Emergency Medicine

## 2017-01-24 DIAGNOSIS — Y999 Unspecified external cause status: Secondary | ICD-10-CM | POA: Insufficient documentation

## 2017-01-24 DIAGNOSIS — T07XXXA Unspecified multiple injuries, initial encounter: Secondary | ICD-10-CM

## 2017-01-24 DIAGNOSIS — Y92524 Gas station as the place of occurrence of the external cause: Secondary | ICD-10-CM | POA: Insufficient documentation

## 2017-01-24 DIAGNOSIS — S80219A Abrasion, unspecified knee, initial encounter: Secondary | ICD-10-CM | POA: Insufficient documentation

## 2017-01-24 DIAGNOSIS — Y939 Activity, unspecified: Secondary | ICD-10-CM | POA: Insufficient documentation

## 2017-01-24 DIAGNOSIS — S60519A Abrasion of unspecified hand, initial encounter: Secondary | ICD-10-CM | POA: Insufficient documentation

## 2017-01-24 DIAGNOSIS — F10920 Alcohol use, unspecified with intoxication, uncomplicated: Secondary | ICD-10-CM | POA: Insufficient documentation

## 2017-01-24 DIAGNOSIS — F17210 Nicotine dependence, cigarettes, uncomplicated: Secondary | ICD-10-CM | POA: Insufficient documentation

## 2017-01-24 DIAGNOSIS — Z79899 Other long term (current) drug therapy: Secondary | ICD-10-CM | POA: Insufficient documentation

## 2017-01-24 DIAGNOSIS — F10120 Alcohol abuse with intoxication, uncomplicated: Secondary | ICD-10-CM | POA: Insufficient documentation

## 2017-01-24 DIAGNOSIS — W19XXXA Unspecified fall, initial encounter: Secondary | ICD-10-CM | POA: Insufficient documentation

## 2017-01-24 NOTE — ED Notes (Signed)
Pt ambulated in hall and was steady. Bus pass given.

## 2017-01-24 NOTE — ED Notes (Signed)
Patient became belligerent, cursing and demanding food. Pt stating he was leaving, provider discharged pt, however pt left before receiving AVS papers.

## 2017-01-24 NOTE — ED Notes (Signed)
Pt seating on the side of the stretcher with a meal tray.

## 2017-01-24 NOTE — ED Triage Notes (Signed)
Patient BIB [facility] [place] [employer] for fall and ETOH. abrasions noted to bilateral knees, dressed by EMS, bleeding controled. Abrasion to left hand and inside of rt arm. Patient hit rt side of head, no injury noted. Patient attempted to ambulate but very unsteady, difficulty picking up feet. Towel roll around neck for spinal restriction.

## 2017-01-24 NOTE — ED Notes (Signed)
Patient resting quietly in hall bed.

## 2017-01-24 NOTE — ED Provider Notes (Signed)
[facility]-EMERGENCY DEPT Provider Note   CSN: [id number] Arrival date & time: 01/24/17  1741     History   Chief Complaint Chief Complaint  Patient presents with  . Alcohol Intoxication    HPI Jordan Davidson is a 46 y.o. male.  HPI   46 year old male with a history of alcoholism, bipolar, depression, frequent visits to the emergency department with 46 visits in 6 months, most recently seen this morning and discharged at 4 AM for alcohol intoxication, presents with concern for alcohol intoxication. Per EMS, and [employer] called 911 when he was found intoxicated, unable to stand on his own. He stated he wanted to walk to the cafeteria because they feed him there.  Patient tells me that he is here because he is drunk.  Reports he drank "a lot" today, was drinking all day.  Past Medical History:  Diagnosis Date  . Alcoholism (HCC)   . Bipolar 1 disorder, depressed (HCC)   . Depression   . Thrombocytopenia [facility](HCC)     Patient Active Problem List   Diagnosis Date Noted  . Malnutrition of moderate degree 01/09/2017  . Tobacco abuse counseling 01/09/2017  . Hyperglycemia 01/06/2017  . Alcohol withdrawal seizure (HCC) 01/06/2017  . Hypomagnesemia 10/14/2016  . Alcohol withdrawal (HCC) 10/13/2016  . Scalp laceration 05/01/2016  . Acute blood loss anemia 05/01/2016  . Thrombocytopenia (HCC) 05/01/2016  . Blunt head trauma 04/28/2016  . Alcohol abuse with alcohol-induced mood disorder (HCC) 12/13/2015  . Bipolar I disorder (HCC) 02/14/2015    Past Surgical History:  Procedure Laterality Date  . NO PAST SURGERIES         Home Medications    Prior to Admission medications   Medication Sig Start Date End Date Taking? Authorizing Provider  chlordiazePOXIDE (LIBRIUM) 25 MG capsule 50mg  PO TID x 1D, then 25-50mg  PO BID X 1D, then 25-50mg  PO QD X 1D Patient not taking: Reported on 01/20/2017 01/20/17   Gilda CreasePollina, Christopher J, MD  feeding supplement, ENSURE ENLIVE, (ENSURE ENLIVE) LIQD  Take 237 mLs by mouth 3 (three) times daily between meals. Patient not taking: Reported on 01/10/2017 01/09/17   Dhungel, Theda BelfastNishant, MD  LORazepam (ATIVAN) 1 MG tablet Take 1 tablet (1 mg total) by mouth every 8 (eight) hours as needed for anxiety (tremors, alcohol withdrawal). Patient not taking: Reported on 01/10/2017 01/09/17   Dhungel, Theda BelfastNishant, MD  nicotine (NICODERM CQ - DOSED IN MG/24 HOURS) 14 mg/24hr patch Place 1 patch (14 mg total) onto the skin daily. Patient not taking: Reported on 01/10/2017 01/10/17   Dhungel, Theda BelfastNishant, MD  OLANZapine (ZYPREXA) 10 MG tablet Take 1 tablet (10 mg total) by mouth at bedtime. Patient not taking: Reported on 01/20/2017 01/20/17   Gilda CreasePollina, Christopher J, MD    Family History Family History  Problem Relation Age of Onset  . Hypertension Mother   . Cancer Father   . Heart failure Father   . Alcoholism Father     Social History Social History  Substance Use Topics  . Smoking status: Current Every Day Smoker    Packs/day: 1.00    Years: 38.00    Types: Cigarettes  . Smokeless tobacco: Never Used  . Alcohol use Yes     Comment: daily - "a lot... as much as I can until I pass out" - 1/5 plus daily     Allergies   Patient has no known allergies.   Review of Systems Review of Systems  Unable to perform ROS: Mental status change  Constitutional: Negative for fever.  Gastrointestinal: Negative for vomiting.  Neurological: Negative for headaches.     Physical Exam Updated Vital Signs BP 123/83 (BP Location: Left Arm)   Pulse (!) 110   Temp 98.6 F (37 C) (Oral)   Resp 20   SpO2 95%   Physical Exam  Constitutional: He appears well-developed and well-nourished. No distress.  Intoxicated, disheveled apperance  HENT:  Head: Normocephalic and atraumatic.  Eyes: Conjunctivae and EOM are normal.  Neck: Normal range of motion.  Cardiovascular: Normal rate, regular rhythm, normal heart sounds and intact distal pulses.  Exam reveals no gallop and  no friction rub.   No murmur heard. Pulmonary/Chest: Effort normal and breath sounds normal. No respiratory distress. He has no wheezes. He has no rales.  Abdominal: Soft. He exhibits no distension. There is no tenderness. There is no guarding.  Musculoskeletal: He exhibits no edema.  Neurological: He is alert.  Skin: Skin is warm and dry. He is not diaphoretic.  Abrasion right knee  Nursing note and vitals reviewed.    ED Treatments / Results  Labs (all labs ordered are listed, but only abnormal results are displayed) Labs Reviewed - No data to display  EKG  EKG Interpretation None       Radiology No results found.  Procedures Procedures (including critical care time)  Medications Ordered in ED Medications - No data to display   Initial Impression / Assessment and Plan / ED Course  I have reviewed the triage vital signs and the nursing notes.  Pertinent labs & imaging results that were available during my care of the patient were reviewed by me and considered in my medical decision making (see chart for details).     46 year old male with a history of alcoholism, bipolar, depression, frequent visits to the emergency department with 46 visits in 6 months, most recently seen this morning and discharged at 4 AM for alcohol intoxication, presents with concern for alcohol intoxication. Patient without other concerns.  Observed until clinically sober, ambulating independently without issue. Patient discharged in stable condition.  Final Clinical Impressions(s) / ED Diagnoses   Final diagnoses:  Alcoholic intoxication without complication [facility](HCC)    New Prescriptions Discharge Medication List as of 01/24/2017 10:28 PM       Alvira MondaySchlossman, Jahn Franchini, MD 01/25/17 [phone]

## 2017-01-24 NOTE — ED Notes (Signed)
Pt is resting with eyes closed.  Rise and fall of chest noted.

## 2017-01-24 NOTE — ED Provider Notes (Signed)
[facility]-EMERGENCY DEPT Provider Note   CSN: [id number] Arrival date & time: 01/24/17  0203     History   Chief Complaint Chief Complaint  Patient presents with  . Fall  . Alcohol Intoxication    HPI Jordan Davidson is a 46 y.o. male.  Patient presents to the ED with a chief complaint of fall and alcohol intoxication.  He was found outside a gas station after sustaining a fall in which he suffered abrasions to his knees and hands.  He states that he wants something to eat.  He denies any other injuries.  He reports to drinking alcohol.  Level 5 caveat applies 2/2 intoxication.   The history is provided by the patient. No language interpreter was used.    Past Medical History:  Diagnosis Date  . Alcoholism (HCC)   . Bipolar 1 disorder, depressed (HCC)   . Depression   . Thrombocytopenia [facility](HCC)     Patient Active Problem List   Diagnosis Date Noted  . Malnutrition of moderate degree 01/09/2017  . Tobacco abuse counseling 01/09/2017  . Hyperglycemia 01/06/2017  . Alcohol withdrawal seizure (HCC) 01/06/2017  . Hypomagnesemia 10/14/2016  . Alcohol withdrawal (HCC) 10/13/2016  . Scalp laceration 05/01/2016  . Acute blood loss anemia 05/01/2016  . Thrombocytopenia (HCC) 05/01/2016  . Blunt head trauma 04/28/2016  . Alcohol abuse with alcohol-induced mood disorder (HCC) 12/13/2015  . Bipolar I disorder (HCC) 02/14/2015    Past Surgical History:  Procedure Laterality Date  . NO PAST SURGERIES         Home Medications    Prior to Admission medications   Medication Sig Start Date End Date Taking? Authorizing Provider  chlordiazePOXIDE (LIBRIUM) 25 MG capsule 50mg  PO TID x 1D, then 25-50mg  PO BID X 1D, then 25-50mg  PO QD X 1D Patient not taking: Reported on 01/20/2017 01/20/17   Gilda CreasePollina, Christopher J, MD  feeding supplement, ENSURE ENLIVE, (ENSURE ENLIVE) LIQD Take 237 mLs by mouth 3 (three) times daily between meals. Patient not taking: Reported on 01/10/2017 01/09/17    Dhungel, Theda BelfastNishant, MD  LORazepam (ATIVAN) 1 MG tablet Take 1 tablet (1 mg total) by mouth every 8 (eight) hours as needed for anxiety (tremors, alcohol withdrawal). Patient not taking: Reported on 01/10/2017 01/09/17   Dhungel, Theda BelfastNishant, MD  nicotine (NICODERM CQ - DOSED IN MG/24 HOURS) 14 mg/24hr patch Place 1 patch (14 mg total) onto the skin daily. Patient not taking: Reported on 01/10/2017 01/10/17   Dhungel, Theda BelfastNishant, MD  OLANZapine (ZYPREXA) 10 MG tablet Take 1 tablet (10 mg total) by mouth at bedtime. Patient not taking: Reported on 01/20/2017 01/20/17   Gilda CreasePollina, Christopher J, MD    Family History Family History  Problem Relation Age of Onset  . Hypertension Mother   . Cancer Father   . Heart failure Father   . Alcoholism Father     Social History Social History  Substance Use Topics  . Smoking status: Current Every Day Smoker    Packs/day: 1.00    Years: 38.00    Types: Cigarettes  . Smokeless tobacco: Never Used  . Alcohol use Yes     Comment: daily - "a lot... as much as I can until I pass out" - 1/5 plus daily     Allergies   Patient has no known allergies.   Review of Systems Review of Systems  Unable to perform ROS: Other     Physical Exam Updated Vital Signs BP 98/61 (BP Location: Right Arm)  Pulse 80   Temp (!) 97.5 F (36.4 C) (Axillary)   Resp 16   SpO2 94%   Physical Exam  Constitutional: He is oriented to person, place, and time. He appears well-developed and well-nourished.  HENT:  Head: Normocephalic and atraumatic.  Eyes: Pupils are equal, round, and reactive to light. Conjunctivae and EOM are normal. Right eye exhibits no discharge. Left eye exhibits no discharge. No scleral icterus.  Neck: Normal range of motion. Neck supple. No JVD present.  Cardiovascular: Normal rate, regular rhythm and normal heart sounds.  Exam reveals no gallop and no friction rub.   No murmur heard. Pulmonary/Chest: Effort normal and breath sounds normal. No  respiratory distress. He has no wheezes. He has no rales. He exhibits no tenderness.  Abdominal: Soft. He exhibits no distension and no mass. There is no tenderness. There is no rebound and no guarding.  Musculoskeletal: Normal range of motion. He exhibits no edema or tenderness.  Neurological: He is alert and oriented to person, place, and time.  Skin: Skin is warm and dry.  Abrasions on hands and knees  Psychiatric: He has a normal mood and affect. His behavior is normal. Judgment and thought content normal.  Nursing note and vitals reviewed.    ED Treatments / Results  Labs (all labs ordered are listed, but only abnormal results are displayed) Labs Reviewed - No data to display  EKG  EKG Interpretation None       Radiology No results found.  Procedures Procedures (including critical care time)  Medications Ordered in ED Medications - No data to display   Initial Impression / Assessment and Plan / ED Course  I have reviewed the triage vital signs and the nursing notes.  Pertinent labs & imaging results that were available during my care of the patient were reviewed by me and considered in my medical decision making (see chart for details).     Patient fell earlier.  Abrasions on hands and knees.  Ambulates.  Now alert and oriented.  Wants to be discharged.    Final Clinical Impressions(s) / ED Diagnoses   Final diagnoses:  Alcoholic intoxication without complication (HCC)  Fall, initial encounter  Abrasions of multiple sites    New Prescriptions New Prescriptions   No medications on file     Roxy HorsemanBrowning, Derian Pfost, Cordelia Poche-C 01/24/17 0451    Palumbo, April, MD 01/24/17 [phone]

## 2017-01-24 NOTE — ED Notes (Signed)
[facility] Expected date:  Expected time:  Means of arrival:  Comments: EMS 46 yo male-fall/abrasions knees and arms-LSB

## 2017-01-24 NOTE — ED Notes (Signed)
Pt is resting comfortably with eyes closed.  Rise and fall of chest noted.

## 2017-01-24 NOTE — ED Triage Notes (Signed)
Per EMS, [employer] called 911 pt found in a parking lot.  Pt is intoxicated, unable to stand on his own.  He was wanting to walk to the cafeteria because he states that "they feed me there."  This nurse told pt if he can stand on his own and ambulated without difficulty he can walk to the cafeteria.  Pt stood up leaning on the stretcher but is unable to stand on his own.  Pt finally laid down on the stretcher.

## 2017-01-25 ENCOUNTER — Encounter (HOSPITAL_COMMUNITY): Payer: Self-pay | Admitting: *Deleted

## 2017-01-25 DIAGNOSIS — Z5321 Procedure and treatment not carried out due to patient leaving prior to being seen by health care provider: Secondary | ICD-10-CM | POA: Insufficient documentation

## 2017-01-25 DIAGNOSIS — F10929 Alcohol use, unspecified with intoxication, unspecified: Secondary | ICD-10-CM | POA: Insufficient documentation

## 2017-01-25 NOTE — ED Triage Notes (Signed)
Per EMS, pt states he wanted to be taken to ED to get something to eat.

## 2017-01-26 ENCOUNTER — Emergency Department (HOSPITAL_COMMUNITY)
Admission: EM | Admit: 2017-01-26 | Discharge: 2017-01-26 | Disposition: A | Discharge status: Home / Self Care | Payer: Self-pay | Attending: Emergency Medicine | Admitting: Emergency Medicine

## 2017-01-26 NOTE — ED Notes (Signed)
Pt being verbally abusive to staff refused to stop profanity in full view of visitors and staff, was asked to leave the hospital property until he could regain control  Of his behavior.

## 2017-02-17 ENCOUNTER — Emergency Department (HOSPITAL_COMMUNITY)
Admission: EM | Admit: 2017-02-17 | Discharge: 2017-02-17 | Disposition: A | Discharge status: Home / Self Care | Payer: Self-pay | Attending: Emergency Medicine | Admitting: Emergency Medicine

## 2017-02-17 ENCOUNTER — Encounter (HOSPITAL_COMMUNITY): Payer: Self-pay | Admitting: Emergency Medicine

## 2017-02-17 DIAGNOSIS — F17210 Nicotine dependence, cigarettes, uncomplicated: Secondary | ICD-10-CM | POA: Insufficient documentation

## 2017-02-17 DIAGNOSIS — F10920 Alcohol use, unspecified with intoxication, uncomplicated: Secondary | ICD-10-CM

## 2017-02-17 DIAGNOSIS — F10129 Alcohol abuse with intoxication, unspecified: Secondary | ICD-10-CM | POA: Insufficient documentation

## 2017-02-17 NOTE — ED Notes (Signed)
Bed: [facility] Expected date:  Expected time:  Means of arrival:  Comments: ETOH dehydration

## 2017-02-17 NOTE — ED Provider Notes (Signed)
[facility]-EMERGENCY DEPT Provider Note   CSN: [id number] Arrival date & time: 02/17/17  1151     History   Chief Complaint Chief Complaint  Patient presents with  . Dehydration  . Alcohol Intoxication    HPI Jordan Davidson is a 46 y.o. male.  HPI  He presents with concern of possibly being an alcohol withdrawal. Note, the patient is sitting upright, eating a sandwich while providing his story, in no distress. Patient notes that he last had alcohol within the past 12 hours, drinks heavily. Acknowledges a history of anxiety, depression, no current suicidal ideation. No fall, no trauma, no fights.   Past Medical History:  Diagnosis Date  . Alcoholism (HCC)   . Bipolar 1 disorder, depressed (HCC)   . Depression   . Thrombocytopenia [facility])     Patient Active Problem List   Diagnosis Date Noted  . Malnutrition of moderate degree 01/09/2017  . Tobacco abuse counseling 01/09/2017  . Hyperglycemia 01/06/2017  . Alcohol withdrawal seizure (HCC) 01/06/2017  . Hypomagnesemia 10/14/2016  . Alcohol withdrawal (HCC) 10/13/2016  . Scalp laceration 05/01/2016  . Acute blood loss anemia 05/01/2016  . Thrombocytopenia (HCC) 05/01/2016  . Blunt head trauma 04/28/2016  . Alcohol abuse with alcohol-induced mood disorder (HCC) 12/13/2015  . Bipolar I disorder (HCC) 02/14/2015    Past Surgical History:  Procedure Laterality Date  . NO PAST SURGERIES         Home Medications    Prior to Admission medications   Medication Sig Start Date End Date Taking? Authorizing Provider  chlordiazePOXIDE (LIBRIUM) 25 MG capsule 50mg  PO TID x 1D, then 25-50mg  PO BID X 1D, then 25-50mg  PO QD X 1D Patient not taking: Reported on 01/20/2017 01/20/17   Gilda Crease, MD  feeding supplement, ENSURE ENLIVE, (ENSURE ENLIVE) LIQD Take 237 mLs by mouth 3 (three) times daily between meals. Patient not taking: Reported on 01/10/2017 01/09/17   Dhungel, Theda Belfast, MD  LORazepam (ATIVAN) 1 MG tablet  Take 1 tablet (1 mg total) by mouth every 8 (eight) hours as needed for anxiety (tremors, alcohol withdrawal). Patient not taking: Reported on 01/10/2017 01/09/17   Dhungel, Theda Belfast, MD  nicotine (NICODERM CQ - DOSED IN MG/24 HOURS) 14 mg/24hr patch Place 1 patch (14 mg total) onto the skin daily. Patient not taking: Reported on 01/10/2017 01/10/17   Dhungel, Theda Belfast, MD  OLANZapine (ZYPREXA) 10 MG tablet Take 1 tablet (10 mg total) by mouth at bedtime. Patient not taking: Reported on 01/20/2017 01/20/17   Gilda Crease, MD    Family History Family History  Problem Relation Age of Onset  . Hypertension Mother   . Cancer Father   . Heart failure Father   . Alcoholism Father     Social History Social History  Substance Use Topics  . Smoking status: Current Every Day Smoker    Packs/day: 1.00    Years: 38.00    Types: Cigarettes  . Smokeless tobacco: Never Used  . Alcohol use Yes     Comment: daily - "a lot... as much as I can until I pass out" - 1/5 plus daily     Allergies   Patient has no known allergies.   Review of Systems Review of Systems  Constitutional:       Per HPI, otherwise negative  HENT:       Per HPI, otherwise negative  Respiratory:       Per HPI, otherwise negative  Cardiovascular:  Per HPI, otherwise negative  Gastrointestinal: Negative for vomiting.  Endocrine:       Negative aside from HPI  Genitourinary:       Neg aside from HPI   Musculoskeletal:       Per HPI, otherwise negative  Skin: Negative for wound.  Psychiatric/Behavioral: Positive for dysphoric mood. The patient is nervous/anxious.      Physical Exam Updated Vital Signs BP 103/76 (BP Location: Left Arm)   Pulse 86   Temp 98.9 F (37.2 C) (Oral)   Resp 16   Ht 6' (1.829 m)   Wt 79.4 kg (175 lb)   SpO2 97%   BMI 23.73 kg/m   Physical Exam  Constitutional: He is oriented to person, place, and time. He appears well-developed. No distress.  Intoxicated adult M,  sitting upright, eating a sandwich  HENT:  Head: Normocephalic and atraumatic.  Eyes: Conjunctivae and EOM are normal.  Cardiovascular: Normal rate and regular rhythm.   Pulmonary/Chest: Effort normal. No stridor. No respiratory distress.  Abdominal: He exhibits no distension.  Musculoskeletal: He exhibits no edema.  Neurological: He is alert and oriented to person, place, and time.  Skin: Skin is warm and dry.  Psychiatric: He has a normal mood and affect.  Nursing note and vitals reviewed.    ED Treatments / Results   Procedures Procedures (including critical care time)  Medications Ordered in ED Medications - No data to display   Initial Impression / Assessment and Plan / ED Course  I have reviewed the triage vital signs and the nursing notes.  Pertinent labs & imaging results that were available during my care of the patient were reviewed by me and considered in my medical decision making (see chart for details).    3:29 PM Patient remains in no distress. Patient remains intoxicated. With no evidence for trauma, no hemodynamic instability, patient is likely appropriate for discharge when more sober.  Patient will require additional evaluation.   Final Clinical Impressions(s) / ED Diagnoses   Final diagnoses:  Acute alcoholic intoxication without complication [facility])     Gerhard Munch, MD 02/17/17 1529

## 2017-02-17 NOTE — ED Notes (Signed)
Bed: [facility] Expected date:  Expected time:  Means of arrival:  Comments: 

## 2017-02-17 NOTE — Discharge Instructions (Signed)
Please follow-up with your physician

## 2017-02-17 NOTE — ED Triage Notes (Signed)
Per EMS-states he has been drinking-BP on scene was 86 palpated-IV placed and fluids given in route-possible dehydration

## 2017-02-22 ENCOUNTER — Emergency Department (HOSPITAL_COMMUNITY)
Admission: EM | Admit: 2017-02-22 | Discharge: 2017-02-23 | Disposition: A | Discharge status: Home / Self Care | Payer: Self-pay | Attending: Emergency Medicine | Admitting: Emergency Medicine

## 2017-02-22 ENCOUNTER — Encounter (HOSPITAL_COMMUNITY): Payer: Self-pay

## 2017-02-22 DIAGNOSIS — F10920 Alcohol use, unspecified with intoxication, uncomplicated: Secondary | ICD-10-CM | POA: Insufficient documentation

## 2017-02-22 DIAGNOSIS — F17210 Nicotine dependence, cigarettes, uncomplicated: Secondary | ICD-10-CM | POA: Insufficient documentation

## 2017-02-22 DIAGNOSIS — Z79899 Other long term (current) drug therapy: Secondary | ICD-10-CM | POA: Insufficient documentation

## 2017-02-22 DIAGNOSIS — R440 Auditory hallucinations: Secondary | ICD-10-CM | POA: Insufficient documentation

## 2017-02-22 NOTE — Discharge Instructions (Signed)
Stop drinking alcohol! Stay well hydrated. Use the resource guide below to get help with your alcoholism. Follow up with your regular doctor for ongoing medical care. Return to the ER for emergent changes or worsening symptoms or conditions.

## 2017-02-22 NOTE — ED Triage Notes (Signed)
Patient arrives by EMS with complaints of intoxication. Patient sitting upright in wheelchair. EMS states they were called out by grocery store owner. EMS states he was sitting up against the wall. CBG 62. Patient given a coke and a sandwich.

## 2017-02-22 NOTE — ED Notes (Signed)
Patient sitting up in bed drinking a coke and eating a sandwich

## 2017-02-22 NOTE — ED Provider Notes (Signed)
[facility]-EMERGENCY DEPT Provider Note   CSN: [id number] Arrival date & time: 02/22/17  1945     History   Chief Complaint Chief Complaint  Patient presents with  . Alcohol Intoxication    HPI Jordan Davidson is a 46 y.o. male with a PMHx of alcoholism, bipolar 1 disorder, thrombocytopenia, and depression, who presents to the ED via EMS for alcohol intoxication. Level 5 caveat due to intoxication. Per EMS, grocery store employee called EMS because pt was found leaning against the wall, given a sandwich and a drink and transported here. Pt well known to the ED with multiple ED visits for alcohol intoxication; has a care plan in place, outlined below. Per patient, he was drinking a lot of alcohol earlier, states that he stopped drinking last night and [place], and immediately requests 3 mg of Ativan for "shakes". He is sitting up in bed drinking a Coke and are 88 a sandwich that was given to him upon his arrival here. He states he has seizures when he withdraws from alcohol and repeatedly asks for Ativan. He refuses to ambulate because he "doesn't want to risk it", even though he reports several times that he hasn't had any EtOH since last night. Can't recall exactly when last night he supposedly stopped drinking. He reports having auditory hallucinations earlier, hearing music playing in the [place] he was in. He denies drug use, SI, HI, visual hallucinations, or any other complaints at this time including denying fevers, chills, CP, SOB, abd pain, N/V/D/C, hematuria, dysuria, myalgias, arthralgias, numbness, tingling, or focal weakness. His story is slightly unreliable given that clinically he seems intoxicated, but it's somewhat difficult to tell how accurate the information is that he's giving.    Care Plan:  -Avoid labs or consulting TTS/ Social work unless patient has a definitive plan of suicide. -Avoid feeding patient. -Expedite patients discharge from  department if clinically sober. If patient is clinically intoxicated, observe until clinically sober. Avoid obtaining a serum ETOH unless medically indicated.Have patient escorted from property if he is non-compliant via security/[employer].   The history is provided by the patient, the EMS personnel and medical records. The history is limited by the condition of the patient. No language interpreter was used.  Alcohol Intoxication  This is a chronic problem. The current episode started more than 1 week ago. The problem occurs constantly. The problem has not changed since onset.Pertinent negatives include no chest pain, no abdominal pain and no shortness of breath. The symptoms are aggravated by drinking. Nothing relieves the symptoms. He has tried nothing for the symptoms. The treatment provided no relief.    Past Medical History:  Diagnosis Date  . Alcoholism (HCC)   . Bipolar 1 disorder, depressed (HCC)   . Depression   . Thrombocytopenia [facility])     Patient Active Problem List   Diagnosis Date Noted  . Malnutrition of moderate degree 01/09/2017  . Tobacco abuse counseling 01/09/2017  . Hyperglycemia 01/06/2017  . Alcohol withdrawal seizure (HCC) 01/06/2017  . Hypomagnesemia 10/14/2016  . Alcohol withdrawal (HCC) 10/13/2016  . Scalp laceration 05/01/2016  . Acute blood loss anemia 05/01/2016  . Thrombocytopenia (HCC) 05/01/2016  . Blunt head trauma 04/28/2016  . Alcohol abuse with alcohol-induced mood disorder (HCC) 12/13/2015  . Bipolar I disorder (HCC) 02/14/2015    Past Surgical History:  Procedure Laterality Date  . NO PAST SURGERIES         Home Medications  Prior to Admission medications   Medication Sig Start Date End Date Taking? Authorizing Provider  busPIRone (BUSPAR) 15 MG tablet Take 15 mg by mouth daily.    [provider]  chlordiazePOXIDE (LIBRIUM) 25 MG capsule 50mg  PO TID x 1D, then 25-50mg  PO BID X 1D, then 25-50mg  PO QD X 1D Patient not taking:  Reported on 01/20/2017 01/20/17   Gilda Crease, MD  feeding supplement, ENSURE ENLIVE, (ENSURE ENLIVE) LIQD Take 237 mLs by mouth 3 (three) times daily between meals. Patient not taking: Reported on 01/10/2017 01/09/17   Dhungel, Theda Belfast, MD  ibuprofen (ADVIL,MOTRIN) 800 MG tablet Take 800 mg by mouth every 8 (eight) hours as needed for mild pain.    [provider]  LORazepam (ATIVAN) 1 MG tablet Take 1 tablet (1 mg total) by mouth every 8 (eight) hours as needed for anxiety (tremors, alcohol withdrawal). Patient not taking: Reported on 01/10/2017 01/09/17   Dhungel, Theda Belfast, MD  nicotine (NICODERM CQ - DOSED IN MG/24 HOURS) 14 mg/24hr patch Place 1 patch (14 mg total) onto the skin daily. Patient not taking: Reported on 01/10/2017 01/10/17   Dhungel, Theda Belfast, MD  OLANZapine (ZYPREXA) 10 MG tablet Take 1 tablet (10 mg total) by mouth at bedtime. 01/20/17   Gilda Crease, MD  trazodone (DESYREL) 300 MG tablet Take 300 mg by mouth at bedtime.    [provider]    Family History Family History  Problem Relation Age of Onset  . Hypertension Mother   . Cancer Father   . Heart failure Father   . Alcoholism Father     Social History Social History  Substance Use Topics  . Smoking status: Current Every Day Smoker    Packs/day: 1.00    Years: 38.00    Types: Cigarettes  . Smokeless tobacco: Never Used  . Alcohol use Yes     Comment: daily - "a lot... as much as I can until I pass out" - 1/5 plus daily     Allergies   Patient has no known allergies.   Review of Systems Review of Systems  Unable to perform ROS: Other  Constitutional: Negative for chills and fever.  Respiratory: Negative for shortness of breath.   Cardiovascular: Negative for chest pain.  Gastrointestinal: Negative for abdominal pain, constipation, diarrhea, nausea and vomiting.  Genitourinary: Negative for dysuria and hematuria.  Musculoskeletal: Negative for arthralgias and myalgias.    Skin: Negative for color change.  Allergic/Immunologic: Negative for immunocompromised state.  Neurological: Negative for weakness and numbness.  Psychiatric/Behavioral: Positive for hallucinations. Negative for suicidal ideas.   Level 5 caveat due to intoxication  Physical Exam Updated Vital Signs BP 128/84   Pulse 97   Temp 98.7 F (37.1 C) (Oral)   Resp 16   SpO2 93%    Physical Exam  Constitutional: He is oriented to person, place, and time. Vital signs are normal. He appears well-developed and well-nourished.  Non-toxic appearance. No distress.  Afebrile, nontoxic, NAD, smells slightly of alcohol, disheveled appearance, drinking a coke and already ate a sandwich prior to evaluation  HENT:  Head: Normocephalic and atraumatic.  Mouth/Throat: Oropharynx is clear and moist and mucous membranes are normal.  Eyes: Conjunctivae and EOM are normal. Right eye exhibits no discharge. Left eye exhibits no discharge.  Neck: Normal range of motion. Neck supple.  Cardiovascular: Normal rate, regular rhythm, normal heart sounds and intact distal pulses.  Exam reveals no gallop and no friction rub.   No murmur heard.  Pulmonary/Chest: Effort normal and breath sounds normal. No respiratory distress. He has no decreased breath sounds. He has no wheezes. He has no rhonchi. He has no rales.  Abdominal: Soft. Normal appearance and bowel sounds are normal. He exhibits no distension. There is no tenderness. There is no rigidity, no rebound, no guarding, no CVA tenderness, no tenderness at McBurney's point and negative Murphy's sign.  Musculoskeletal: Normal range of motion.  Neurological: He is alert and oriented to person, place, and time. He has normal strength. He displays no tremor. No sensory deficit.  A&O x3 Seems intoxicated however able to hold on a conversation, slightly slurred speech No tremors appreciated Pt refusing to walk for evaluation Strength and sensation grossly intact in all  extremities  Skin: Skin is warm and dry. Abrasion noted. No rash noted.  Multiple old abrasions over b/l extremities, no fresh appearing wounds or evidence of trauma  Psychiatric: He has a normal mood and affect. He is actively hallucinating (reports AH). He expresses no homicidal and no suicidal ideation. He expresses no suicidal plans and no homicidal plans.  Appears intoxicated. Denies SI, HI, or VH, reports AH earlier but doesn't seem to be responding to internal stimuli. CIWA score 2  Nursing note and vitals reviewed.  20:39:35 CIWA Alcohol Scale TD  CIWA-Ar  BP: 128/84  Pulse Rate: 97  Nausea and Vomiting: no nausea and no vomiting (patient is eating and drinking)  Tactile Disturbances: none  Tremor: no tremor  Auditory Disturbances: not present  Paroxysmal Sweats: no sweat visible  Visual Disturbances: not present  Anxiety: mildly anxious (sleeping)  Headache, Fullness in Head: none present  Agitation: normal activity  Orientation and Clouding of Sensorium: cannot do serial additions or is uncertain about date  CIWA-Ar Total: 2     ED Treatments / Results  Labs (all labs ordered are listed, but only abnormal results are displayed) Labs Reviewed - No data to display  EKG  EKG Interpretation None       Radiology No results found.  Procedures Procedures (including critical care time)  Medications Ordered in ED Medications - No data to display   Initial Impression / Assessment and Plan / ED Course  I have reviewed the triage vital signs and the nursing notes.  Pertinent labs & imaging results that were available during my care of the patient were reviewed by me and considered in my medical decision making (see chart for details).     46 y.o. male here for EtOH intoxication, found outside a grocery store leaning against a wall. Well known to the ED, multiple visits for EtOH intoxication; care plan in place. On assessment, he immediately asks for 3mg  ativan and  has already been given a coke and a sandwich which he ate immediately (despite what his care plan suggests, of avoiding feeding the pt). He reports that he stopped drinking last night but he smells of alcohol so I question how truthful this is. He is A&O x3 however he refuses to ambulate until he gets ativan. He seems intoxicated, doesn't have any tremors on exam, but repeatedly refuses to ambulate. CIWA 2, and he immediately went to sleep directly after CIWA score was done/evaluation was completed. Will let pt sober up as I suspect he's actually intoxicated, despite what he reports, and then as soon as he can ambulate he'll be able to be discharged. No emergent work up indicated at this time. Will monitor.   11:00 PM Pt sleeping; will need to continue sobering  up, and will need to ambulate prior to discharge. Patient care to be resumed by Fayrene HelperBowie Tran, PA-C at shift change sign-out. Patient history has been discussed with midlevel resuming care. Please see their notes for further documentation of pending results and dispo/care. Pt stable at sign-out and transfer of care.    Final Clinical Impressions(s) / ED Diagnoses   Final diagnoses:  Alcoholic intoxication without complication [facility](HCC)    New Prescriptions New Prescriptions   No medications on [address]file     Mikel Pyon, [place], [place] JerseyPA-C 02/22/17 2300    Shaune PollackIsaacs, [place], MD 02/23/17 1158

## 2017-02-22 NOTE — ED Notes (Signed)
Bed: WHALD Expected date:  Expected time:  Means of arrival:  Comments: 

## 2017-02-23 NOTE — ED Provider Notes (Signed)
Patient has been monitored in the ER for more than 7 hours, he felt much better. He is clinically sober. He is able to ambulate. Patient will be discharge.  BP 101/61 (BP Location: Left Arm)   Pulse 86   Temp 98.7 F (37.1 C) (Oral)   Resp 16   SpO2 100%   Results for orders placed or performed during the hospital encounter of 01/10/17  Comprehensive metabolic panel  Result Value Ref Range   Sodium 135 135 - 145 mmol/L   Potassium 3.8 3.5 - 5.1 mmol/L   Chloride 101 101 - 111 mmol/L   CO2 23 22 - 32 mmol/L   Glucose, Bld 103 (H) 65 - 99 mg/dL   BUN 9 6 - 20 mg/dL   Creatinine, Ser 4.090.62 0.61 - 1.24 mg/dL   Calcium 8.6 (L) 8.9 - 10.3 mg/dL   Total Protein 7.1 6.5 - 8.1 g/dL   Albumin 4.1 3.5 - 5.0 g/dL   AST 80 (H) 15 - 41 U/L   ALT 53 17 - 63 U/L   Alkaline Phosphatase 85 38 - 126 U/L   Total Bilirubin 0.4 0.3 - 1.2 mg/dL   GFR calc non Af Amer >60 >60 mL/min   GFR calc Af Amer >60 >60 mL/min   Anion gap 11 5 - 15  Ethanol  Result Value Ref Range   Alcohol, Ethyl (B) 324 (HH) <5 mg/dL  cbc  Result Value Ref Range   WBC 7.5 4.0 - 10.5 K/uL   RBC 4.17 (L) 4.22 - 5.81 MIL/uL   Hemoglobin 13.4 13.0 - 17.0 g/dL   HCT 81.137.5 (L) 91.439.0 - 78.252.0 %   MCV 89.9 78.0 - 100.0 fL   MCH 32.1 26.0 - 34.0 pg   MCHC 35.7 30.0 - 36.0 g/dL   RDW 95.615.7 (H) 21.311.5 - 08.615.5 %   Platelets 86 (L) 150 - 400 K/uL  Rapid urine drug screen (hospital performed)  Result Value Ref Range   Opiates NONE DETECTED NONE DETECTED   Cocaine NONE DETECTED NONE DETECTED   Benzodiazepines POSITIVE (A) NONE DETECTED   Amphetamines NONE DETECTED NONE DETECTED   Tetrahydrocannabinol NONE DETECTED NONE DETECTED   Barbiturates NONE DETECTED NONE DETECTED   No results found.    Fayrene Helperran, Zeppelin Beckstrand, PA-C 02/23/17 [phone]    Tomasita Crumbleni, Adeleke, MD 02/23/17 [phone]

## 2017-02-24 ENCOUNTER — Encounter (HOSPITAL_COMMUNITY): Payer: Self-pay | Admitting: Nurse Practitioner

## 2017-02-24 DIAGNOSIS — R739 Hyperglycemia, unspecified: Secondary | ICD-10-CM | POA: Insufficient documentation

## 2017-02-24 DIAGNOSIS — Z79899 Other long term (current) drug therapy: Secondary | ICD-10-CM | POA: Insufficient documentation

## 2017-02-24 DIAGNOSIS — F319 Bipolar disorder, unspecified: Secondary | ICD-10-CM | POA: Insufficient documentation

## 2017-02-24 DIAGNOSIS — F17210 Nicotine dependence, cigarettes, uncomplicated: Secondary | ICD-10-CM | POA: Insufficient documentation

## 2017-02-24 DIAGNOSIS — F10920 Alcohol use, unspecified with intoxication, uncomplicated: Secondary | ICD-10-CM | POA: Insufficient documentation

## 2017-02-24 NOTE — ED Triage Notes (Signed)
Pt is brought in by EMS for intoxication. Wheeled in by wheelchair and able to maintain upright position and able to answer questions.

## 2017-02-25 ENCOUNTER — Encounter (HOSPITAL_COMMUNITY): Payer: Self-pay

## 2017-02-25 ENCOUNTER — Emergency Department (HOSPITAL_COMMUNITY)
Admission: EM | Admit: 2017-02-25 | Discharge: 2017-02-26 | Disposition: A | Discharge status: Home / Self Care | Payer: Self-pay | Attending: Emergency Medicine | Admitting: Emergency Medicine

## 2017-02-25 ENCOUNTER — Emergency Department (HOSPITAL_COMMUNITY)
Admission: EM | Admit: 2017-02-25 | Discharge: 2017-02-25 | Disposition: A | Discharge status: Home / Self Care | Payer: Self-pay | Attending: Emergency Medicine | Admitting: Emergency Medicine

## 2017-02-25 DIAGNOSIS — F10920 Alcohol use, unspecified with intoxication, uncomplicated: Secondary | ICD-10-CM

## 2017-02-25 DIAGNOSIS — F10929 Alcohol use, unspecified with intoxication, unspecified: Secondary | ICD-10-CM | POA: Insufficient documentation

## 2017-02-25 DIAGNOSIS — Z79899 Other long term (current) drug therapy: Secondary | ICD-10-CM | POA: Insufficient documentation

## 2017-02-25 MED ORDER — CHLORDIAZEPOXIDE HCL 25 MG PO CAPS: ORAL_CAPSULE | ORAL | 0 refills | Status: AC

## 2017-02-25 NOTE — ED Notes (Signed)
Pt unable to steadily ambulate at this time.

## 2017-02-25 NOTE — ED Provider Notes (Signed)
[facility]-EMERGENCY DEPT Provider Note   CSN: [id number] Arrival date & time: 02/25/17  1537     History   Chief Complaint Chief Complaint  Patient presents with  . Alcohol Intoxication    HPI Jordan Davidson is a 46 y.o. male.  Patient with hx etoh abuse, presents via ems, after being intoxicated in public.  Patient poor historian, uncooperative - level 5 caveat. Hx heavy etoh abuse, frequent ED visits for same. Denies pain/injury.    The history is provided by the patient and the EMS personnel. The history is limited by the condition of the patient.  Alcohol Intoxication     Past Medical History:  Diagnosis Date  . Alcoholism (HCC)   . Bipolar 1 disorder, depressed (HCC)   . Depression   . Thrombocytopenia [facility])     Patient Active Problem List   Diagnosis Date Noted  . Malnutrition of moderate degree 01/09/2017  . Tobacco abuse counseling 01/09/2017  . Hyperglycemia 01/06/2017  . Alcohol withdrawal seizure (HCC) 01/06/2017  . Hypomagnesemia 10/14/2016  . Alcohol withdrawal (HCC) 10/13/2016  . Scalp laceration 05/01/2016  . Acute blood loss anemia 05/01/2016  . Thrombocytopenia (HCC) 05/01/2016  . Blunt head trauma 04/28/2016  . Alcohol abuse with alcohol-induced mood disorder (HCC) 12/13/2015  . Bipolar I disorder (HCC) 02/14/2015    Past Surgical History:  Procedure Laterality Date  . NO PAST SURGERIES         Home Medications    Prior to Admission medications   Medication Sig Start Date End Date Taking? Authorizing Provider  busPIRone (BUSPAR) 15 MG tablet Take 15 mg by mouth daily.    [provider]  chlordiazePOXIDE (LIBRIUM) 25 MG capsule 50mg  PO TID x 1D, then 25-50mg  PO BID X 1D, then 25-50mg  PO QD X 1D; if needed for symptoms of withdrawal from alcohol 02/25/17   Mancel Bale, MD  feeding supplement, ENSURE ENLIVE, (ENSURE ENLIVE) LIQD Take 237 mLs by mouth 3 (three) times daily between meals. Patient not taking: Reported on 01/10/2017  01/09/17   Dhungel, Theda Belfast, MD  ibuprofen (ADVIL,MOTRIN) 800 MG tablet Take 800 mg by mouth every 8 (eight) hours as needed for mild pain.    [provider]  LORazepam (ATIVAN) 1 MG tablet Take 1 tablet (1 mg total) by mouth every 8 (eight) hours as needed for anxiety (tremors, alcohol withdrawal). Patient not taking: Reported on 01/10/2017 01/09/17   Dhungel, Theda Belfast, MD  nicotine (NICODERM CQ - DOSED IN MG/24 HOURS) 14 mg/24hr patch Place 1 patch (14 mg total) onto the skin daily. Patient not taking: Reported on 01/10/2017 01/10/17   Dhungel, Theda Belfast, MD  OLANZapine (ZYPREXA) 10 MG tablet Take 1 tablet (10 mg total) by mouth at bedtime. 01/20/17   Gilda Crease, MD  trazodone (DESYREL) 300 MG tablet Take 300 mg by mouth at bedtime.    [provider]    Family History Family History  Problem Relation Age of Onset  . Hypertension Mother   . Cancer Father   . Heart failure Father   . Alcoholism Father     Social History Social History  Substance Use Topics  . Smoking status: Current Every Day Smoker    Packs/day: 1.00    Years: 38.00    Types: Cigarettes  . Smokeless tobacco: Never Used  . Alcohol use Yes     Comment: daily - "a lot... as much as I can until I pass out" - 1/5 plus daily     Allergies  Patient has no known allergies.   Review of Systems Review of Systems  Unable to perform ROS: Mental status change  pt intoxicated and uncooperative w ros - level 5 caveat.    Physical Exam Updated Vital Signs BP 99/75 (BP Location: Left Arm)   Pulse 100   Temp 98.8 F (37.1 C)   Resp 16   SpO2 93%   Physical Exam  Constitutional: He appears well-developed and well-nourished. No distress.  HENT:  Head: Atraumatic.  Mouth/Throat: Oropharynx is clear and moist.  Eyes: Pupils are equal, round, and reactive to light. Conjunctivae are normal. No scleral icterus.  Neck: Neck supple. No tracheal deviation present.  Cardiovascular: Normal rate,  regular rhythm, normal heart sounds and intact distal pulses.   Pulmonary/Chest: Effort normal and breath sounds normal. No accessory muscle usage. No respiratory distress. He exhibits no tenderness.  Abdominal: Soft. Bowel sounds are normal. He exhibits no distension. There is no tenderness.  Musculoskeletal: He exhibits no edema or tenderness.  Neurological: He is alert.  Alert. Intoxicated appearing. Ambulatory. Moving bil ext purposefully w good strength.   Skin: Skin is warm and dry. He is not diaphoretic.  Psychiatric:  Intoxicated. Denies SI.   Nursing note and vitals reviewed.    ED Treatments / Results  Labs (all labs ordered are listed, but only abnormal results are displayed) Labs Reviewed - No data to display  EKG  EKG Interpretation None       Radiology No results found.  Procedures Procedures (including critical care time)  Medications Ordered in ED Medications - No data to display   Initial Impression / Assessment and Plan / ED Course  I have reviewed the triage vital signs and the nursing notes.  Pertinent labs & imaging results that were available during my care of the patient were reviewed by me and considered in my medical decision making (see chart for details).  Reviewed nursing notes and prior charts for additional history.   Pt with recent heavy etoh use, and currently intoxicated.   Will reassess when clinically more sober.   Recheck, chest cta. Rrr. No tremor or shakes. Still appears quite intoxicated.  Will plan to observe in ED overnight, and release in AM when more sober.   Signed out patient to overnight physician, Dr Wilkie AyeHorton.    Final Clinical Impressions(s) / ED Diagnoses   Final diagnoses:  None    New Prescriptions New Prescriptions   No medications on file     Cathren LaineSteinl, Amreen Raczkowski, MD 02/26/17 [phone]

## 2017-02-25 NOTE — Discharge Instructions (Signed)
Avoid all forms of alcohol.  Follow-up for treatment at an outpatient alcohol abuse facility.

## 2017-02-25 NOTE — ED Provider Notes (Signed)
[facility]-EMERGENCY DEPT Provider Note   CSN: [id number] Arrival date & time: 02/24/17  1954     History   Chief Complaint Chief Complaint  Patient presents with  . Alcohol Intoxication    HPI Jordan Davidson is a 46 y.o. male.  Patient arrives for evaluation of alcohol abuse with request for detoxification.  He was in the waiting room for 10 hours prior to being placed in an examination area.  He was seen by me at 06: 00 hours.  At this time he states he wants to stop drinking, and that he is worried about having a seizure if he does so.  He states that he is homeless.  He denies other problems, at this time.  There are no other known modifying factors.  HPI  Past Medical History:  Diagnosis Date  . Alcoholism (HCC)   . Bipolar 1 disorder, depressed (HCC)   . Depression   . Thrombocytopenia [facility](HCC)     Patient Active Problem List   Diagnosis Date Noted  . Malnutrition of moderate degree 01/09/2017  . Tobacco abuse counseling 01/09/2017  . Hyperglycemia 01/06/2017  . Alcohol withdrawal seizure (HCC) 01/06/2017  . Hypomagnesemia 10/14/2016  . Alcohol withdrawal (HCC) 10/13/2016  . Scalp laceration 05/01/2016  . Acute blood loss anemia 05/01/2016  . Thrombocytopenia (HCC) 05/01/2016  . Blunt head trauma 04/28/2016  . Alcohol abuse with alcohol-induced mood disorder (HCC) 12/13/2015  . Bipolar I disorder (HCC) 02/14/2015    Past Surgical History:  Procedure Laterality Date  . NO PAST SURGERIES         Home Medications    Prior to Admission medications   Medication Sig Start Date End Date Taking? Authorizing Provider  busPIRone (BUSPAR) 15 MG tablet Take 15 mg by mouth daily.    [provider]  chlordiazePOXIDE (LIBRIUM) 25 MG capsule 50mg  PO TID x 1D, then 25-50mg  PO BID X 1D, then 25-50mg  PO QD X 1D Patient not taking: Reported on 01/20/2017 01/20/17   Gilda CreasePollina, Christopher J, MD  feeding supplement, ENSURE ENLIVE, (ENSURE ENLIVE) LIQD Take 237 mLs by  mouth 3 (three) times daily between meals. Patient not taking: Reported on 01/10/2017 01/09/17   Dhungel, Theda BelfastNishant, MD  ibuprofen (ADVIL,MOTRIN) 800 MG tablet Take 800 mg by mouth every 8 (eight) hours as needed for mild pain.    [provider]  LORazepam (ATIVAN) 1 MG tablet Take 1 tablet (1 mg total) by mouth every 8 (eight) hours as needed for anxiety (tremors, alcohol withdrawal). Patient not taking: Reported on 01/10/2017 01/09/17   Dhungel, Theda BelfastNishant, MD  nicotine (NICODERM CQ - DOSED IN MG/24 HOURS) 14 mg/24hr patch Place 1 patch (14 mg total) onto the skin daily. Patient not taking: Reported on 01/10/2017 01/10/17   Dhungel, Theda BelfastNishant, MD  OLANZapine (ZYPREXA) 10 MG tablet Take 1 tablet (10 mg total) by mouth at bedtime. 01/20/17   Gilda CreasePollina, Christopher J, MD  trazodone (DESYREL) 300 MG tablet Take 300 mg by mouth at bedtime.    [provider]    Family History Family History  Problem Relation Age of Onset  . Hypertension Mother   . Cancer Father   . Heart failure Father   . Alcoholism Father     Social History Social History  Substance Use Topics  . Smoking status: Current Every Day Smoker    Packs/day: 1.00    Years: 38.00    Types: Cigarettes  . Smokeless tobacco: Never Used  . Alcohol use Yes  Comment: daily - "a lot... as much as I can until I pass out" - 1/5 plus daily     Allergies   Patient has no known allergies.   Review of Systems Review of Systems  All other systems reviewed and are negative.    Physical Exam Updated Vital Signs BP (!) 193/121 (BP Location: Right Arm)   Pulse (!) 117   Temp 98.5 F (36.9 C) (Oral)   Resp 18   SpO2 98%   Physical Exam  Constitutional: He is oriented to person, place, and time. He appears well-developed.  Under nourished, disheveled  HENT:  Head: Normocephalic and atraumatic.  Right Ear: External ear normal.  Left Ear: External ear normal.  Eyes: Pupils are equal, round, and reactive to light.  Conjunctivae and EOM are normal.  Neck: Normal range of motion and phonation normal. Neck supple.  Cardiovascular: Normal rate.   Pulmonary/Chest: Effort normal. He exhibits no bony tenderness.  Abdominal: Soft. There is no tenderness.  Musculoskeletal: Normal range of motion. He exhibits no deformity.  Normal gait  Neurological: He is alert and oriented to person, place, and time. No cranial nerve deficit or sensory deficit. He exhibits normal muscle tone. Coordination normal.  Mild dysarthria  Skin: Skin is warm, dry and intact.  Scattered excoriations and abrasions  Psychiatric: He has a normal mood and affect. His behavior is normal.  Nursing note and vitals reviewed.    ED Treatments / Results  Labs (all labs ordered are listed, but only abnormal results are displayed) Labs Reviewed - No data to display  EKG  EKG Interpretation None       Radiology No results found.  Procedures Procedures (including critical care time)  Medications Ordered in ED Medications - No data to display   Initial Impression / Assessment and Plan / ED Course  I have reviewed the triage vital signs and the nursing notes.  Pertinent labs & imaging results that were available during my care of the patient were reviewed by me and considered in my medical decision making (see chart for details).      Patient Vitals for the past 24 hrs:  BP Temp Temp src Pulse Resp SpO2  02/25/17 0409 (!) 193/121 98.5 F (36.9 C) Oral (!) 117 18 98 %  02/24/17 2115 122/87 98 F (36.7 C) Oral (!) 104 18 90 %       Final Clinical Impressions(s) / ED Diagnoses   Final diagnoses:  Alcoholic intoxication without complication (HCC)   Patiently clinically sober after his stay in the emergency department.  He reports seizures, with alcohol withdrawal.  Patient is a very frequent patient in the emergency department.  He will be offered a Librium prescription, to use if needed to help with symptoms of alcohol  withdrawal.  Nursing Notes Reviewed/ Care Coordinated Applicable Imaging Reviewed Interpretation of Laboratory Data incorporated into ED treatment  The patient appears reasonably screened and/or stabilized for discharge and I doubt any other medical condition or other [place] requiring further screening, evaluation, or treatment in the ED at this time prior to discharge.  Plan: Home Medications- OTC prn; Home Treatments-stop all forms of alcohol ,rest, fluids; return here if the recommended treatment, does not improve the symptoms; Recommended follow up- PCP prn, alcohol detox facility of choice as outpatient  New Prescriptions New Prescriptions   No medications on file     Mancel Bale, MD 02/25/17 [phone]

## 2017-02-25 NOTE — ED Triage Notes (Signed)
Pt BIB [facility] stating that his "stomach hurts because I'm drunk and hungry." He is cursing at staff demanding a sandwich. He endorses having 2 5ths of vodka and "at least 10 beers" PTA. Denies N/V.

## 2017-02-25 NOTE — ED Notes (Signed)
Patient does not have si or hi. Patient wants to detox from alcohol.

## 2017-02-26 ENCOUNTER — Encounter (HOSPITAL_COMMUNITY): Payer: Self-pay | Admitting: Emergency Medicine

## 2017-02-26 ENCOUNTER — Emergency Department (HOSPITAL_COMMUNITY)
Admission: EM | Admit: 2017-02-26 | Discharge: 2017-02-26 | Disposition: A | Discharge status: Home / Self Care | Payer: Self-pay | Attending: Emergency Medicine | Admitting: Emergency Medicine

## 2017-02-26 DIAGNOSIS — F10920 Alcohol use, unspecified with intoxication, uncomplicated: Secondary | ICD-10-CM | POA: Insufficient documentation

## 2017-02-26 DIAGNOSIS — Z79899 Other long term (current) drug therapy: Secondary | ICD-10-CM | POA: Insufficient documentation

## 2017-02-26 DIAGNOSIS — F17210 Nicotine dependence, cigarettes, uncomplicated: Secondary | ICD-10-CM | POA: Insufficient documentation

## 2017-02-26 NOTE — ED Notes (Signed)
Bed: WA31 Expected date:  Expected time:  Means of arrival:  Comments: 

## 2017-02-26 NOTE — ED Notes (Addendum)
Pt states he does not want to wait for his discharge paperwork or for vital signs to be taken.

## 2017-02-26 NOTE — ED Provider Notes (Signed)
Patient signed out pending sobering up. Patient is arousable. Ambulates independently. Eating without difficulty. Will discharge.   Shon BatonHorton, Courtney F, MD 02/26/17 [phone]

## 2017-02-26 NOTE — ED Provider Notes (Signed)
[facility] DEPT Provider Note   CSN: [id number] Arrival date & time: 02/26/17  1634     History   Chief Complaint Chief Complaint  Patient presents with  . Alcohol Intoxication    HPI Jordan Davidson is a 47 y.o. male.  Patient is a 46 year old male with a history of alcoholism, bipolar disease who is presenting today with alcohol intoxication. Patient has been seen 3 times in the last 2 days for similar symptoms. Patient is in no acute distress at this time but is obviously intoxicated. He is awake and able to speak but is not able to walk stably. He has no evidence of injury at this time. He denies any chest pain, shortness of breath or abdominal pain.  He denies any suicidal ideation or homicidal ideation. He states he just drinks every day and it's a problem.   The history is provided by the patient.  Alcohol Intoxication  This is a chronic problem.    Past Medical History:  Diagnosis Date  . Alcoholism (HCC)   . Bipolar 1 disorder, depressed (HCC)   . Depression   . Thrombocytopenia [facility])     Patient Active Problem List   Diagnosis Date Noted  . Malnutrition of moderate degree 01/09/2017  . Tobacco abuse counseling 01/09/2017  . Hyperglycemia 01/06/2017  . Alcohol withdrawal seizure (HCC) 01/06/2017  . Hypomagnesemia 10/14/2016  . Alcohol withdrawal (HCC) 10/13/2016  . Scalp laceration 05/01/2016  . Acute blood loss anemia 05/01/2016  . Thrombocytopenia (HCC) 05/01/2016  . Blunt head trauma 04/28/2016  . Alcohol abuse with alcohol-induced mood disorder (HCC) 12/13/2015  . Bipolar I disorder (HCC) 02/14/2015    Past Surgical History:  Procedure Laterality Date  . NO PAST SURGERIES         Home Medications    Prior to Admission medications   Medication Sig Start Date End Date Taking? Authorizing Provider  busPIRone (BUSPAR) 15 MG tablet Take 15 mg by mouth daily.    [provider]  chlordiazePOXIDE (LIBRIUM) 25 MG capsule 50mg  PO TID x  1D, then 25-50mg  PO BID X 1D, then 25-50mg  PO QD X 1D; if needed for symptoms of withdrawal from alcohol Patient not taking: Reported on 02/26/2017 02/25/17   Mancel Bale, MD  feeding supplement, ENSURE ENLIVE, (ENSURE ENLIVE) LIQD Take 237 mLs by mouth 3 (three) times daily between meals. Patient not taking: Reported on 01/10/2017 01/09/17   Dhungel, Theda Belfast, MD  ibuprofen (ADVIL,MOTRIN) 800 MG tablet Take 800 mg by mouth every 8 (eight) hours as needed for mild pain.    [provider]  LORazepam (ATIVAN) 1 MG tablet Take 1 tablet (1 mg total) by mouth every 8 (eight) hours as needed for anxiety (tremors, alcohol withdrawal). Patient not taking: Reported on 01/10/2017 01/09/17   Dhungel, Theda Belfast, MD  nicotine (NICODERM CQ - DOSED IN MG/24 HOURS) 14 mg/24hr patch Place 1 patch (14 mg total) onto the skin daily. Patient not taking: Reported on 01/10/2017 01/10/17   Dhungel, Theda Belfast, MD  OLANZapine (ZYPREXA) 10 MG tablet Take 1 tablet (10 mg total) by mouth at bedtime. 01/20/17   Gilda Crease, MD  trazodone (DESYREL) 300 MG tablet Take 300 mg by mouth at bedtime.    [provider]    Family History Family History  Problem Relation Age of Onset  . Hypertension Mother   . Cancer Father   . Heart failure Father   . Alcoholism Father     Social History Social History  Substance  Use Topics  . Smoking status: Current Every Day Smoker    Packs/day: 1.00    Years: 38.00    Types: Cigarettes  . Smokeless tobacco: Never Used  . Alcohol use Yes     Comment: daily - "a lot... as much as I can until I pass out" - 1/5 plus daily     Allergies   Patient has no known allergies.   Review of Systems Review of Systems  All other systems reviewed and are negative.    Physical Exam Updated Vital Signs BP 128/89 (BP Location: Left Arm)   Pulse 100   Temp 98.9 F (37.2 C) (Oral)   Resp 18   SpO2 94%   Physical Exam  Constitutional: He is oriented to person, place,  and time. He appears well-developed and well-nourished. No distress.  HENT:  Head: Normocephalic and atraumatic.  Mouth/Throat: Oropharynx is clear and moist.  Eyes: Pupils are equal, round, and reactive to light. Conjunctivae and EOM are normal.  Neck: Normal range of motion. Neck supple.  Cardiovascular: Normal rate, regular rhythm and intact distal pulses.   No murmur heard. Pulmonary/Chest: Effort normal and breath sounds normal. No respiratory distress. He has no wheezes. He has no rales.  Abdominal: Soft. He exhibits no distension. There is no tenderness. There is no rebound and no guarding.  Musculoskeletal: Normal range of motion. He exhibits no edema or tenderness.  Neurological: He is oriented to person, place, and time.  Slight slurred speech and speech slowed  Skin: Skin is warm and dry. No rash noted. No erythema.  Psychiatric: He is slowed. Cognition and memory are impaired. He expresses no homicidal and no suicidal ideation.  Nursing note and vitals reviewed.    ED Treatments / Results  Labs (all labs ordered are listed, but only abnormal results are displayed) Labs Reviewed - No data to display  EKG  EKG Interpretation None       Radiology No results found.  Procedures Procedures (including critical care time)  Medications Ordered in ED Medications - No data to display   Initial Impression / Assessment and Plan / ED Course  I have reviewed the triage vital signs and the nursing notes.  Pertinent labs & imaging results that were available during my care of the patient were reviewed by me and considered in my medical decision making (see chart for details).    Patient presenting intoxicated for the third time in 2 days. Patient has a care plan because he has multiple visits for similar episodes. Patient does not appear to have any injury at this time. He is in no acute distress. He is clinically intoxicated at this point and is not stable for discharge at  this time.When patient is clinically sober he will be discharged. Vital signs are within normal limits.  9:29 PM Pt sober and ambulating without difficulty  Final Clinical Impressions(s) / ED Diagnoses   Final diagnoses:  Alcoholic intoxication without complication [facility](HCC)    New Prescriptions New Prescriptions   No medications on file     Gwyneth SproutPlunkett, Charles Andringa, MD 02/26/17 2129

## 2017-02-26 NOTE — ED Notes (Signed)
Pt ambulated in the hall without assistance with slow, steady gait.

## 2017-02-26 NOTE — ED Triage Notes (Signed)
[facility] after [facility] was found in the street intoxicated. Alert and oriented.

## 2017-03-26 DEATH — deceased

## 2018-11-04 IMAGING — CT CT HEAD W/O CM
3 of 4 series · 15 of 47 positions shown, 18 images · non-contrast
Comparison: 04/28/2016

CLINICAL DATA: Found down in the grass dried blood around the nose

EXAM:
CT HEAD WITHOUT CONTRAST
TECHNIQUE: Contiguous axial images were obtained from the base of the skull
through the vertex without intravenous contrast.

[Series 2: head w/o · axial · non-contrast · 0.45mm/px · z∈[-145,-30]mm · 9 of 29 slices shown, 12 images]
[im 3/29  brain]
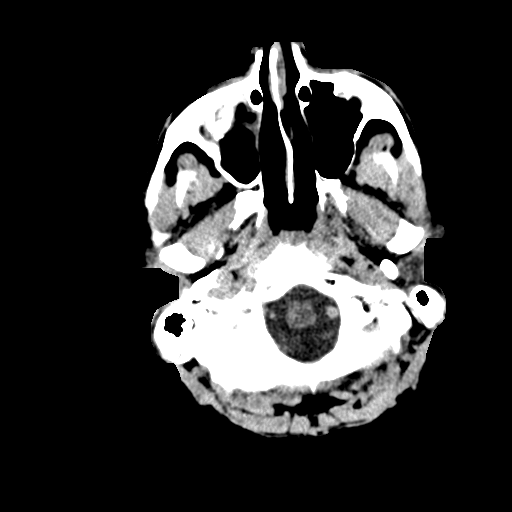
[im 3/29  bone]
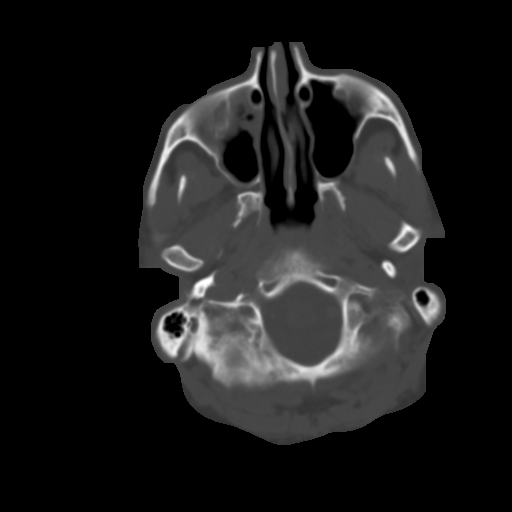
[im 6/29  brain]
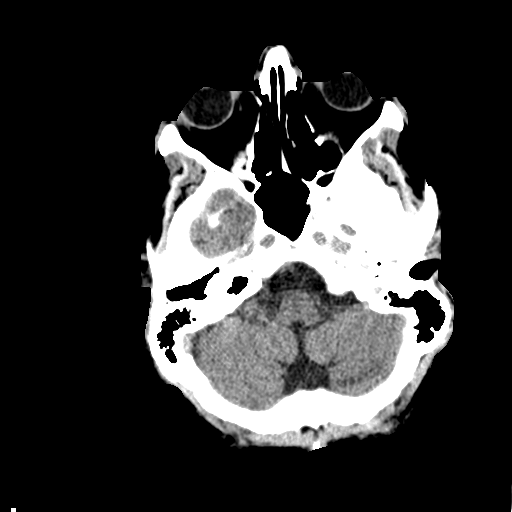
[im 9/29  brain]
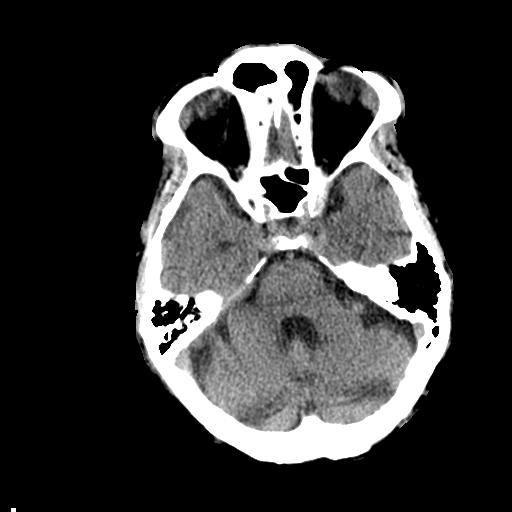
[im 12/29  brain]
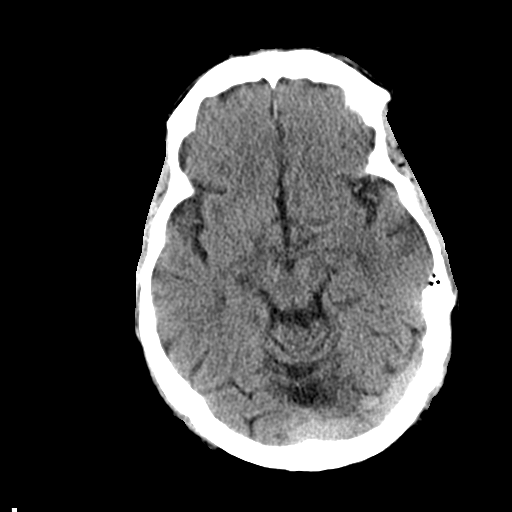
[im 15/29  brain]
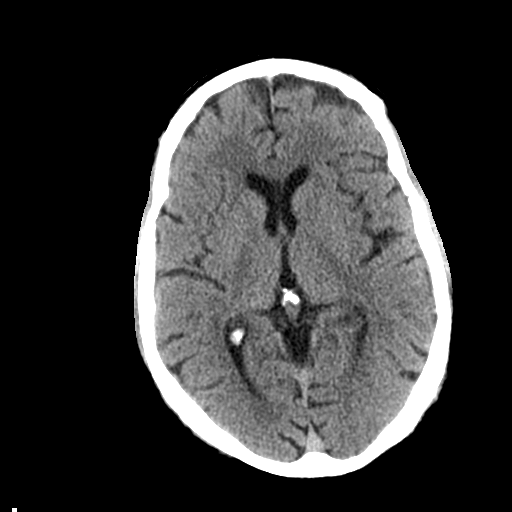
[im 15/29  bone]
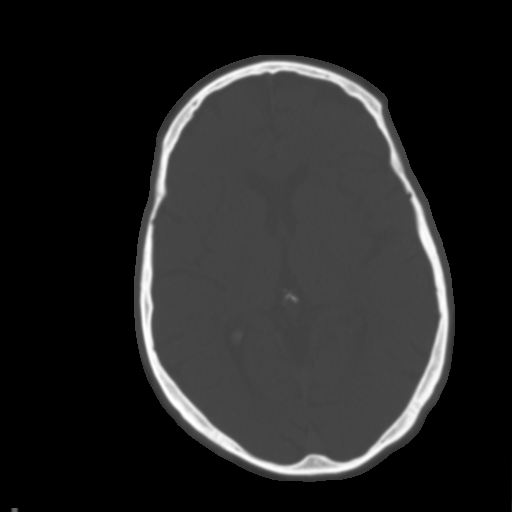
[im 17/29  brain]
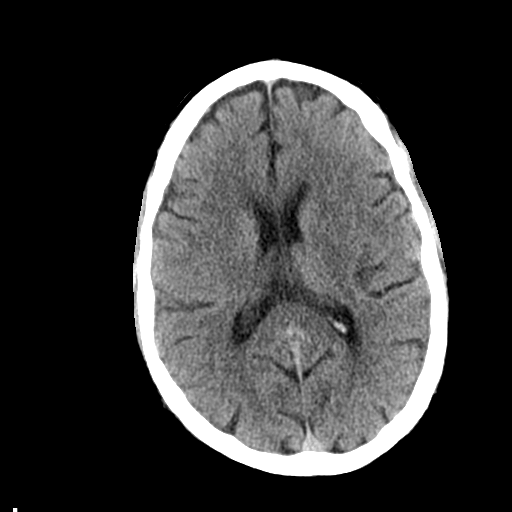
[im 20/29  brain]
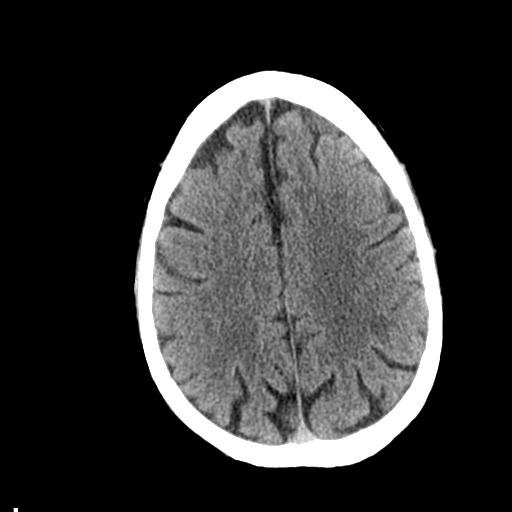
[im 23/29  brain]
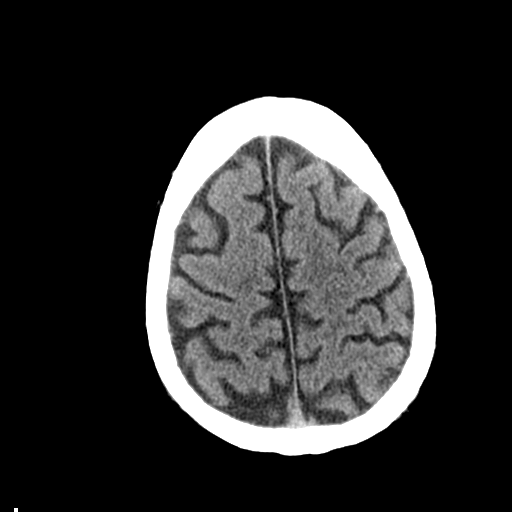
[im 26/29  brain]
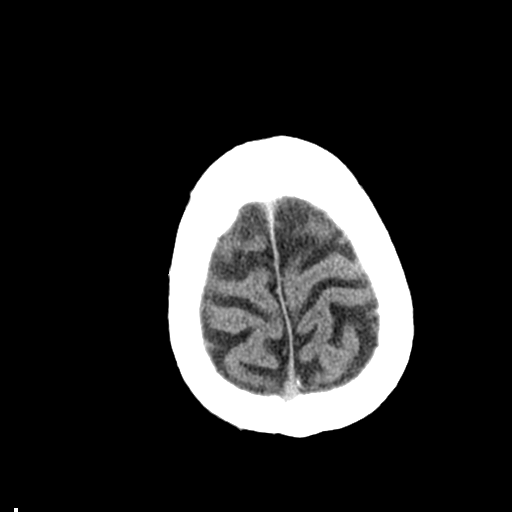
[im 26/29  bone]
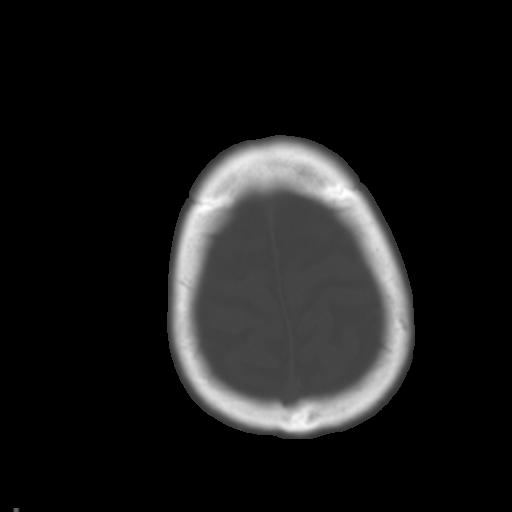

[Series 5: coronal · coronal · 0.32mm/px · 3 of 69 slices shown]
[im 23/69  brain]
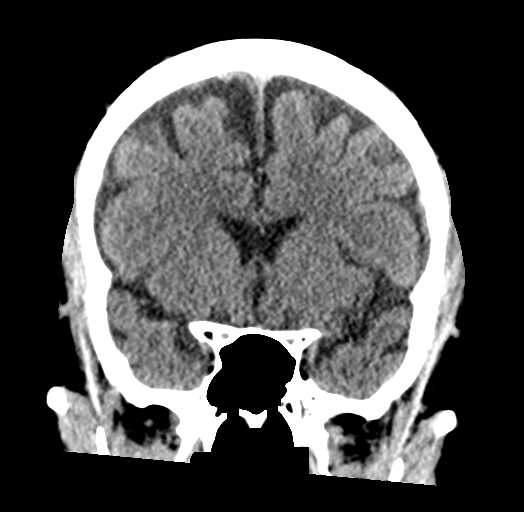
[im 31/69  brain]
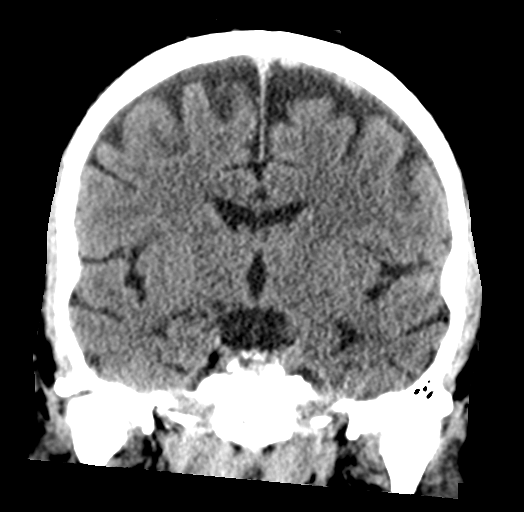
[im 38/69  brain]
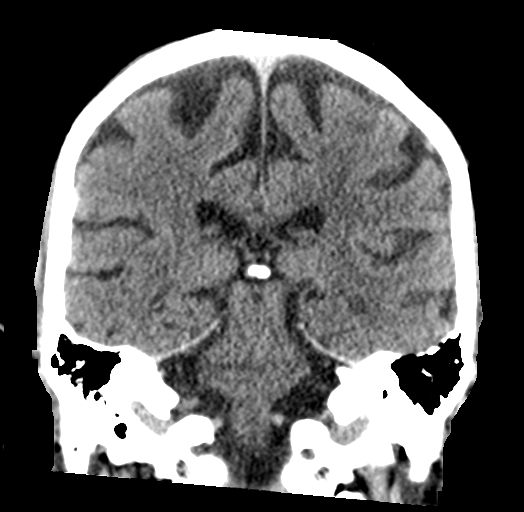

[Series 6: sagittal · sagittal · 0.32mm/px · 3 of 48 slices shown]
[im 16/48  brain]
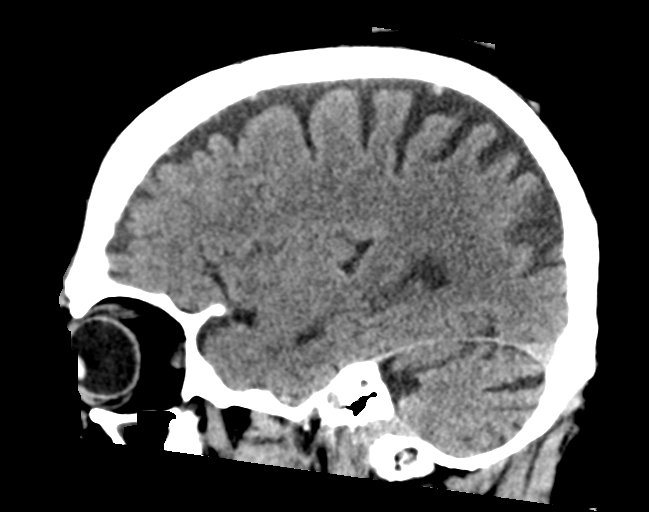
[im 24/48  brain]
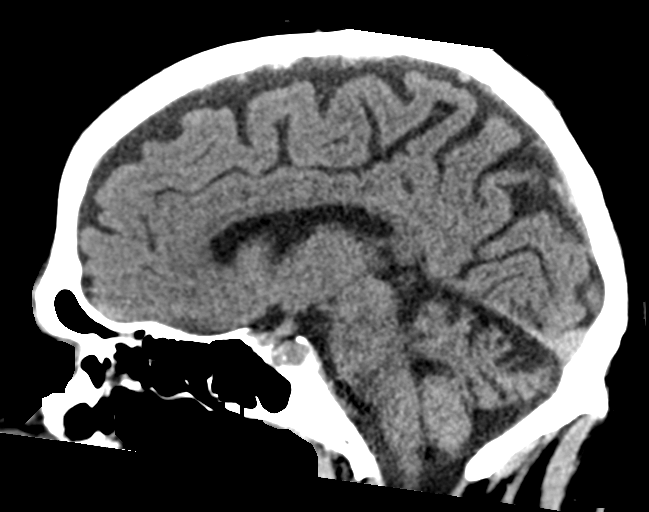
[im 32/48  brain]
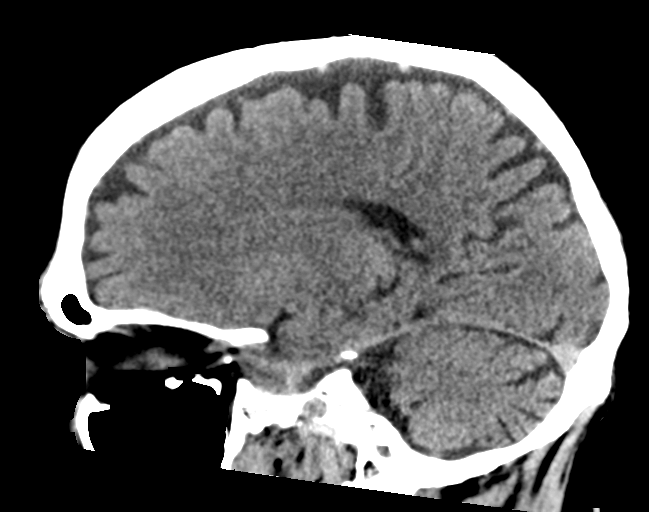

[15 of 47 positions shown; findings below may reference images not displayed]

FINDINGS: Brain: No evidence of acute infarction, hemorrhage, hydrocephalus,
extra-axial collection or mass lesion/mass effect. Mild to moderate
atrophy, advanced for age.

Vascular: No hyperdense vessels.  No unexpected calcifications.

Skull: Normal. Negative for fracture or focal lesion.

Sinuses/Orbits: Mucosal thickening in the ethmoid and right
maxillary sinus. No acute orbital abnormality.

Other: None
IMPRESSION: No CT evidence for acute intracranial abnormality.
